# Patient Record
Sex: Female | Born: 1960
Health system: Southern US, Community
[De-identification: ages and names within clinical notes are randomized; demographics above are authoritative.]

## PROBLEM LIST (undated history)

## (undated) DIAGNOSIS — K760 Fatty (change of) liver, not elsewhere classified: Secondary | ICD-10-CM

## (undated) DIAGNOSIS — T4145XA Adverse effect of unspecified anesthetic, initial encounter: Secondary | ICD-10-CM

## (undated) DIAGNOSIS — Z8489 Family history of other specified conditions: Secondary | ICD-10-CM

## (undated) DIAGNOSIS — R112 Nausea with vomiting, unspecified: Secondary | ICD-10-CM

## (undated) DIAGNOSIS — K8010 Calculus of gallbladder with chronic cholecystitis without obstruction: Principal | ICD-10-CM

## (undated) DIAGNOSIS — E781 Pure hyperglyceridemia: Secondary | ICD-10-CM

## (undated) DIAGNOSIS — N6002 Solitary cyst of left breast: Secondary | ICD-10-CM

## (undated) DIAGNOSIS — I10 Essential (primary) hypertension: Secondary | ICD-10-CM

## (undated) DIAGNOSIS — C801 Malignant (primary) neoplasm, unspecified: Secondary | ICD-10-CM

## (undated) DIAGNOSIS — D649 Anemia, unspecified: Secondary | ICD-10-CM

## (undated) DIAGNOSIS — G43009 Migraine without aura, not intractable, without status migrainosus: Secondary | ICD-10-CM

## (undated) DIAGNOSIS — K8070 Calculus of gallbladder and bile duct without cholecystitis without obstruction: Secondary | ICD-10-CM

## (undated) DIAGNOSIS — E782 Mixed hyperlipidemia: Secondary | ICD-10-CM

## (undated) DIAGNOSIS — Z5189 Encounter for other specified aftercare: Secondary | ICD-10-CM

## (undated) DIAGNOSIS — R7303 Prediabetes: Secondary | ICD-10-CM

## (undated) DIAGNOSIS — Z9889 Other specified postprocedural states: Secondary | ICD-10-CM

## (undated) DIAGNOSIS — R7302 Impaired glucose tolerance (oral): Secondary | ICD-10-CM

## (undated) DIAGNOSIS — K851 Biliary acute pancreatitis without necrosis or infection: Secondary | ICD-10-CM

## (undated) HISTORY — DX: Biliary acute pancreatitis without necrosis or infection: K85.10

## (undated) HISTORY — PX: COLONOSCOPY: SHX174

## (undated) HISTORY — DX: Fatty (change of) liver, not elsewhere classified: K76.0

## (undated) HISTORY — DX: Encounter for other specified aftercare: Z51.89

## (undated) HISTORY — DX: Solitary cyst of left breast: N60.02

## (undated) HISTORY — DX: Essential (primary) hypertension: I10

## (undated) HISTORY — DX: Calculus of gallbladder with chronic cholecystitis without obstruction: K80.10

## (undated) HISTORY — DX: Anemia, unspecified: D64.9

## (undated) HISTORY — DX: Pure hyperglyceridemia: E78.1

## (undated) HISTORY — DX: Migraine without aura, not intractable, without status migrainosus: G43.009

---

## 2000-07-17 DIAGNOSIS — T8859XA Other complications of anesthesia, initial encounter: Secondary | ICD-10-CM

## 2000-07-17 HISTORY — DX: Other complications of anesthesia, initial encounter: T88.59XA

## 2002-09-16 ENCOUNTER — Ambulatory Visit (HOSPITAL_COMMUNITY): Admission: RE | Admit: 2002-09-16 | Discharge: 2002-09-16 | Payer: Self-pay | Admitting: Internal Medicine

## 2002-09-16 ENCOUNTER — Encounter: Payer: Self-pay | Admitting: Internal Medicine

## 2003-05-07 ENCOUNTER — Ambulatory Visit (HOSPITAL_COMMUNITY): Admission: RE | Admit: 2003-05-07 | Discharge: 2003-05-07 | Payer: Self-pay | Admitting: Obstetrics

## 2003-05-07 ENCOUNTER — Encounter: Payer: Self-pay | Admitting: Obstetrics

## 2003-05-18 ENCOUNTER — Encounter (INDEPENDENT_AMBULATORY_CARE_PROVIDER_SITE_OTHER): Payer: Self-pay | Admitting: *Deleted

## 2003-05-18 LAB — CONVERTED CEMR LAB

## 2003-09-23 ENCOUNTER — Encounter: Admission: RE | Admit: 2003-09-23 | Discharge: 2003-09-23 | Payer: Self-pay | Admitting: Family Medicine

## 2005-04-13 ENCOUNTER — Ambulatory Visit: Payer: Self-pay | Admitting: Family Medicine

## 2005-04-13 ENCOUNTER — Encounter: Admission: RE | Admit: 2005-04-13 | Discharge: 2005-04-13 | Payer: Self-pay | Admitting: Sports Medicine

## 2005-04-17 ENCOUNTER — Ambulatory Visit: Payer: Self-pay | Admitting: Family Medicine

## 2005-04-21 ENCOUNTER — Ambulatory Visit: Payer: Self-pay | Admitting: Family Medicine

## 2005-04-21 ENCOUNTER — Ambulatory Visit (HOSPITAL_COMMUNITY): Admission: RE | Admit: 2005-04-21 | Discharge: 2005-04-21 | Payer: Self-pay | Admitting: Family Medicine

## 2005-04-26 ENCOUNTER — Encounter: Admission: RE | Admit: 2005-04-26 | Discharge: 2005-04-26 | Payer: Self-pay | Admitting: Family Medicine

## 2006-09-13 DIAGNOSIS — K649 Unspecified hemorrhoids: Secondary | ICD-10-CM | POA: Insufficient documentation

## 2006-09-14 ENCOUNTER — Encounter (INDEPENDENT_AMBULATORY_CARE_PROVIDER_SITE_OTHER): Payer: Self-pay | Admitting: *Deleted

## 2006-12-06 ENCOUNTER — Ambulatory Visit: Payer: Self-pay | Admitting: Gynecology

## 2006-12-07 ENCOUNTER — Ambulatory Visit (HOSPITAL_COMMUNITY): Admission: RE | Admit: 2006-12-07 | Discharge: 2006-12-07 | Payer: Self-pay | Admitting: Physician Assistant

## 2007-01-09 ENCOUNTER — Ambulatory Visit: Payer: Self-pay | Admitting: Family Medicine

## 2007-01-09 DIAGNOSIS — I10 Essential (primary) hypertension: Secondary | ICD-10-CM | POA: Insufficient documentation

## 2007-01-09 DIAGNOSIS — G43009 Migraine without aura, not intractable, without status migrainosus: Secondary | ICD-10-CM

## 2007-01-09 HISTORY — DX: Migraine without aura, not intractable, without status migrainosus: G43.009

## 2007-02-01 ENCOUNTER — Ambulatory Visit: Payer: Self-pay | Admitting: Family Medicine

## 2007-02-15 ENCOUNTER — Ambulatory Visit (HOSPITAL_COMMUNITY): Admission: RE | Admit: 2007-02-15 | Discharge: 2007-02-15 | Payer: Self-pay | Admitting: Family Medicine

## 2007-09-18 ENCOUNTER — Ambulatory Visit: Payer: Self-pay | Admitting: Family Medicine

## 2007-09-25 ENCOUNTER — Ambulatory Visit: Payer: Self-pay | Admitting: Gynecology

## 2007-12-19 ENCOUNTER — Ambulatory Visit (HOSPITAL_COMMUNITY): Admission: RE | Admit: 2007-12-19 | Discharge: 2007-12-19 | Payer: Self-pay | Admitting: Family Medicine

## 2007-12-25 ENCOUNTER — Encounter: Payer: Self-pay | Admitting: Obstetrics & Gynecology

## 2007-12-25 ENCOUNTER — Ambulatory Visit: Payer: Self-pay | Admitting: Obstetrics & Gynecology

## 2008-09-16 ENCOUNTER — Encounter: Admission: RE | Admit: 2008-09-16 | Discharge: 2008-09-16 | Payer: Self-pay | Admitting: Obstetrics

## 2009-01-31 ENCOUNTER — Ambulatory Visit: Payer: Self-pay | Admitting: Occupational Medicine

## 2009-02-10 ENCOUNTER — Encounter: Payer: Self-pay | Admitting: Family Medicine

## 2009-02-10 ENCOUNTER — Ambulatory Visit: Payer: Self-pay | Admitting: Family Medicine

## 2009-02-23 ENCOUNTER — Ambulatory Visit: Payer: Self-pay | Admitting: Family Medicine

## 2009-02-23 ENCOUNTER — Encounter: Payer: Self-pay | Admitting: Family Medicine

## 2009-02-23 DIAGNOSIS — Z780 Asymptomatic menopausal state: Secondary | ICD-10-CM | POA: Insufficient documentation

## 2009-02-23 LAB — CONVERTED CEMR LAB
AST: 11 units/L (ref 0–37)
Alkaline Phosphatase: 75 units/L (ref 39–117)
CO2: 26 meq/L (ref 19–32)
Cholesterol: 274 mg/dL — ABNORMAL HIGH (ref 0–200)
Hemoglobin: 13.3 g/dL (ref 12.0–15.0)
LDL Cholesterol: 171 mg/dL — ABNORMAL HIGH (ref 0–99)
MCHC: 32.3 g/dL (ref 30.0–36.0)
MCV: 98.3 fL (ref 78.0–100.0)
Platelets: 306 10*3/uL (ref 150–400)
Potassium: 4.5 meq/L (ref 3.5–5.3)
RDW: 13.7 % (ref 11.5–15.5)
Triglycerides: 172 mg/dL — ABNORMAL HIGH (ref <150)
WBC: 7.9 10*3/uL (ref 4.0–10.5)

## 2009-02-24 ENCOUNTER — Encounter: Payer: Self-pay | Admitting: Family Medicine

## 2009-02-24 DIAGNOSIS — R7303 Prediabetes: Secondary | ICD-10-CM | POA: Insufficient documentation

## 2009-02-24 DIAGNOSIS — E782 Mixed hyperlipidemia: Secondary | ICD-10-CM | POA: Insufficient documentation

## 2009-07-30 ENCOUNTER — Ambulatory Visit: Payer: Self-pay | Admitting: Family Medicine

## 2009-11-05 ENCOUNTER — Encounter: Admission: RE | Admit: 2009-11-05 | Discharge: 2009-11-05 | Payer: Self-pay | Admitting: Family Medicine

## 2010-02-08 ENCOUNTER — Ambulatory Visit: Payer: Self-pay | Admitting: Family Medicine

## 2010-02-08 ENCOUNTER — Encounter: Payer: Self-pay | Admitting: Family Medicine

## 2010-02-09 LAB — CONVERTED CEMR LAB
AST: 13 units/L (ref 0–37)
HDL: 42 mg/dL (ref >39)
Potassium: 4.1 meq/L (ref 3.5–5.3)
TSH: 0.95 microintl units/mL (ref 0.350–4.500)
Total Bilirubin: 0.6 mg/dL (ref 0.3–1.2)
Total CHOL/HDL Ratio: 6
Triglycerides: 522 mg/dL — ABNORMAL HIGH (ref <150)
Vit D, 25-Hydroxy: 32 ng/mL (ref 30–89)

## 2010-05-26 ENCOUNTER — Encounter: Payer: Self-pay | Admitting: Family Medicine

## 2010-08-06 ENCOUNTER — Encounter: Payer: Self-pay | Admitting: Obstetrics & Gynecology

## 2010-08-16 NOTE — Miscellaneous (Signed)
  Clinical Lists Changes  Problems: Removed problem of HEALTH MAINTENANCE EXAM (ICD-V70.0) Removed problem of UNSPECIFIED CONTRACEPTIVE MANAGEMENT (ICD-V25.9) Removed problem of HAIR LOSS (ICD-704.00) Removed problem of BARTHOLIN'S CYST (ICD-616.2) Removed problem of SCREENING FOR MALIGNANT NEOPLASM OF THE CERVIX (ICD-V76.2) Removed problem of HEADACHE (ICD-784.0)

## 2010-08-16 NOTE — Assessment & Plan Note (Signed)
Summary: CYST/ [facility]   Vital Signs:  Patient profile:   50 year old female Weight:      153 pounds Temp:     98 degrees F oral Pulse rate:   80 / minute BP sitting:   110 / 73  (right arm)  Vitals Entered By: Arlyss Repress CMA, (July 30, 2009 11:36 AM) CC: cyst on labia x 2 days. Is Patient Diabetic? No Pain Assessment Patient in pain? yes     Location: labia Intensity: 7   CC:  cyst on labia x 2 days.[name]  History of Present Illness: Cyst on labia, has been there for a long time, seemed to swell get larger the past 2 days and more uncomfortable.  No drainage, no fever.  Habits & Providers  Alcohol-Tobacco-Diet     Tobacco Status: never  Current Medications (verified): 1)  Vitamin .... Once A Day 2)  Atenolol-Chlorthalidone 50-25 Mg Tabs (Atenolol-Chlorthalidone) .... One Daily 3)  Royal Jelly .... As You Have Been Using For Hot Flashes  Allergies: No Known Drug Allergies  Physical Exam  General:  Well-developed,well-nourished,in no acute distress; alert,appropriate and cooperative throughout examination Genitalia:  left raised bathloins gland, central opening, mildy tender, no signs of infection.   Impression & Recommendations:  Problem # 1:  BARTHOLIN'S CYST (ICD-616.2)  Does not appear to be infected, patient chose to treat conservatively and not have I and D today, would recommend she had I&D if it does not improve or have secondary infection.  Discussed seeing GYN for this, as they have instruments to remove the cystic sac.  Orders: [facility]- Est Level  2 ([address])  Complete Medication List: 1)  Vitamin  .... Once a day 2)  Atenolol-chlorthalidone 50-25 Mg Tabs (Atenolol-chlorthalidone) .... One daily 3)  Royal Jelly  .... As you have been using for hot flashes  Patient Instructions: 1)  Frequent hot sitz baths, if not better return Monday

## 2010-08-16 NOTE — Assessment & Plan Note (Signed)
Summary: PHYSICAL/PAP/[facility]   Vital Signs:  Patient profile:   50 year old female Height:      60.5 inches Weight:      148 pounds BMI:     28.53 Temp:     98.0 degrees F oral Pulse rate:   90 / minute BP sitting:   120 / 77  (left arm) Cuff size:   regular  Vitals Entered By: Tessie Fass CMA (February 08, 2010 8:57 AM) CC: complete physical with pap Is Patient Diabetic? No Pain Assessment Patient in pain? no        CC:  complete physical with pap.  History of Present Illness: Here for annual health maintenence exam.  She had a PAP last August that was normal and never has had an abnormal PAP.    Her bood sugars intermittently are elevated.  She has known high cholesterol and has refused statin therapy up to this point.  She is taking her BP medicaion and BP has been running under 120.  She denies any further migrane headaches.  The bartholins cyst resolved itself with home care and has not returned.    She has not had a menses since May 2011.  She wants to make sure she is not pregnant.  Current Medications (verified): 1)  Vitamin .... Once A Day 2)  Atenolol-Chlorthalidone 50-25 Mg Tabs (Atenolol-Chlorthalidone) .... One Daily  Allergies (verified): No Known Drug Allergies  Review of Systems      See HPI General:  Denies loss of appetite, malaise, sleep disorder, sweats, and weight loss. CV:  Denies chest pain or discomfort and swelling of feet. Resp:  Denies cough. GI:  Denies constipation and diarrhea. GU:  Denies discharge and dysuria. MS:  Denies joint pain. Psych:  Denies depression.  Physical Exam  General:  Well-developed,well-nourished,in no acute distress; alert,appropriate and cooperative throughout examination Eyes:  PERL, fundi normal Ears:  R ear normal and L ear normal.   Nose:  External nasal examination shows no deformity or inflammation. Nasal mucosa are pink and moist without lesions or exudates. Mouth:  good dentition, no dental plaque, and  pharynx pink and moist.   Neck:  No deformities, masses, or tenderness noted. Breasts:  No mass, nodules, thickening, tenderness, bulging, retraction, inflamation, nipple discharge or skin changes noted.   Lungs:  normal respiratory effort and normal breath sounds.   Heart:  normal rate, regular rhythm, and no murmur.   Abdomen:  soft, non-tender, normal bowel sounds, and no distention.   Msk:  normal ROM and no joint tenderness.   Pulses:  R and L carotid,radial,femoral,dorsalis pedis and posterior tibial pulses are full and equal bilaterally Extremities:  No clubbing, cyanosis, edema, or deformity noted with normal full range of motion of all joints.   Neurologic:  alert & oriented X3, cranial nerves II-XII intact, and strength normal in all extremities.   Skin:  Intact without suspicious lesions or rashes Cervical Nodes:  No lymphadenopathy noted Axillary Nodes:  No palpable lymphadenopathy Psych:  Cognition and judgment appear intact. Alert and cooperative with normal attention span and concentration. No apparent delusions, illusions, hallucinations   Impression & Recommendations:  Problem # 1:  HEALTH MAINTENANCE EXAM (ICD-V70.0)  Orders: [facility] - Est  40-64 yrs ([address]11)  Problem # 2:  HYPERTENSION, BENIGN ESSENTIAL (ICD-401.1)  Her updated medication list for this problem includes:    Atenolol-chlorthalidone 50-25 Mg Tabs (Atenolol-chlorthalidone) ..... One daily  Problem # 3:  HAIR LOSS (ICD-704.00)  Orders: TSH-[facility] ([address]1478-29562)  Problem # 4:  HYPERLIPIDEMIA (ICD-272.4)  Orders: T-Lipid Profile (95621-30865)  Problem # 5:  HYPERGLYCEMIA (ICD-790.29)  Orders: A1C-[facility] (78469)  Complete Medication List: 1)  Vitamin  .... Once a day 2)  Atenolol-chlorthalidone 50-25 Mg Tabs (Atenolol-chlorthalidone) .... One daily  Other Orders: T-Comprehensive Metabolic Panel [id number]) Vit D, 25 [place]-[facility] ([address]-27253) U Preg-[facility] ([address]40)  Patient Instructions: 1)  PAP every 2  years due afte 02/24/11 2)  return as needed 3)  I will call you with your cholesterol 4)  Recommend aspirin 81 mg daily Prescriptions: ATENOLOL-CHLORTHALIDONE 50-25 MG TABS (ATENOLOL-CHLORTHALIDONE) one daily  #90 x 3   Entered and Authorized by:   Luretha Murphy NP   Signed by:   Luretha Murphy NP on 02/08/2010   Method used:   Electronically to        [facility]* (retail)       [address]       [address] Shipping/mailing       [place], [place]  [address]       Ph: [phone]       Fax: [phone]   RxID:   [id number]    Prevention & Chronic Care Immunizations   Influenza vaccine: Not documented    Tetanus booster: 07/17/2001: Done.    Pneumococcal vaccine: Not documented  Other Screening   Pap smear: NEGATIVE FOR INTRAEPITHELIAL LESIONS OR MALIGNANCY.  (02/23/2009)   Pap smear action/deferral: Deferred-2 yr interval  (02/08/2010)    Mammogram: ASSESSMENT: Negative - BI-RADS 1^MM DIGITAL SCREENING  (11/05/2009)   Mammogram due: 11/06/2010   Smoking status: never  (07/30/2009)  Lipids   Total Cholesterol: 274  (02/23/2009)   Lipid panel action/deferral: Lipid Panel ordered   LDL: 171  (02/23/2009)   LDL Direct: Not documented   HDL: 69  (02/23/2009)   Triglycerides: 172  (02/23/2009)    SGOT (AST): 11  (02/23/2009)   BMP action: Ordered   SGPT (ALT): 24  (02/23/2009) CMP ordered    Alkaline phosphatase: 75  (02/23/2009)   Total bilirubin: 0.6  (02/23/2009)    Lipid flowsheet reviewed?: Yes   Progress toward LDL goal: Improved  Hypertension   Last Blood Pressure: 120 / 77  (02/08/2010)   Serum creatinine: 0.65  (02/23/2009)   BMP action: Ordered   Serum potassium 4.5  (02/23/2009) CMP ordered     Hypertension flowsheet reviewed?: Yes   Progress toward BP goal: Improved  Self-Management Support :    Hypertension self-management support: Not documented    Lipid self-management support: Not documented    Appended  Document: A1c  6.1 %    Lab Visit  Laboratory Results   Urine Tests  Date/Time Received: February 08, 2010 9:55 AM  Date/Time Reported: February 08, 2010 2:25 PM     Urine HCG: negative Comments: ...............test performed by......Marland KitchenBonnie A. [place], MLS (ASCP)cm   Blood Tests   Date/Time Received: February 08, 2010 9:55 AM  Date/Time Reported: February 08, 2010 2:24 PM   HGBA1C: 6.1%   (Normal Range: Non-Diabetic - 3-6%   Control Diabetic - 6-8%)  Comments: ...............test performed by......Marland KitchenBonnie A. [place], MLS (ASCP)cm    Orders Today:

## 2010-08-24 ENCOUNTER — Encounter: Payer: Self-pay | Admitting: *Deleted

## 2010-10-19 ENCOUNTER — Ambulatory Visit (INDEPENDENT_AMBULATORY_CARE_PROVIDER_SITE_OTHER): Payer: Commercial Managed Care - PPO | Admitting: Family Medicine

## 2010-10-19 ENCOUNTER — Other Ambulatory Visit: Payer: Self-pay | Admitting: Family Medicine

## 2010-10-19 VITALS — BP 115/77 | HR 97 | Temp 98.8°F | Wt 153.4 lb

## 2010-10-19 DIAGNOSIS — Z1239 Encounter for other screening for malignant neoplasm of breast: Secondary | ICD-10-CM

## 2010-10-19 DIAGNOSIS — N61 Mastitis without abscess: Secondary | ICD-10-CM | POA: Insufficient documentation

## 2010-10-19 MED ORDER — AMOXICILLIN-POT CLAVULANATE 875-125 MG PO TABS: 1.0000 | ORAL_TABLET | Freq: Two times a day (BID) | ORAL | Status: AC

## 2010-10-19 NOTE — Patient Instructions (Signed)
Take the antibiotic as directed When you are feeling better we should get you scheduled for mammogram  I hope you feel better! Have a great day!  - Dr. Wallene Huh       Mastitis (Breast Infection) Mastitis is a germ (bacterial) infection of the breast tissue. CAUSES Bacteria causes infection by entering the breast tissue through cuts or openings in the skin. Typically, this occurs with breastfeeding due to cracked or irritated skin. It can be associated with plugged ducts. Nipple piercing can also lead to mastitis. SYMPTOMS In mastitis, an area of the breast becomes puffy (swollen), red, tender, and painful. You may notice you have a fever and swelling of the glands under your arm on that side. If the infection is allowed to progress, an collection of pus (abscess) may develop. DIAGNOSIS Your caregiver can diagnose mastitis based on your symptoms, and upon examination. The diagnosis can be confirmed if pus can be expressed from the breast. This pus can be examined in the lab to determine which bacteria are present. If an abscess has developed, the fluid in the abscess can be removed with a needle. This is used to confirm the diagnosis and determine the bacteria present. In most cases, pus will not be present. Blood tests can be done to determine if your body is fighting a bacterial infection. Sometimes, a mammogram or ultrasound will be recommended to exclude other breast diseases including cancer. Other rare forms of mastitis:  Tuberculosis mastitis is rare. The TB germ can affect the breast if it is present in some other part of the body. The breast may be slightly tender with a mass, but not tender or painful.   Syphilis of the nipple usually has an ulcer that is not tender.   Actinomycosis is a very rare bacteria infection of the breast that presents as a mass in the breast that is not tender or painful.   Phlebitis (inflammation of blood vessels) of the breast is an inflammation of the  veins in the breast. It may be caused by tight fitting bras, surgery or trauma to the breast.   Inflammatory carcinoma of the breast looks like mastitis because the breasts are red, swollen and tender, but it is a rare form of breast cancer.  TREATMENT Medicines that kill germs (antibiotics) are used to treat the bacterial infection. Your caregiver will determine which bacteria are most likely to be causing the infection and select an antibiotic. This is sometimes changed based on the results of cultures, or if there is no response to the antibiotic selected. Antibiotics are usually given by mouth. If you are breastfeeding, it is important to continue to empty the breast. Your caregiver can tell you whether or not this milk is safe for your infant, or needs to be thrown away. Pain can usually be treated with medication. HOME CARE INSTRUCTIONS  Take all antibiotics as told. Do not stop them just because you have improved after a few days.   Only take over-the-counter or prescription medicines for pain, discomfort, or fever as directed by your caregiver.   If breastfeeding, keep your nipples clean and dry. Your caregiver may tell you to stop nursing until he or she feels it is safe for your baby. Use a breast pump as instructed if forced to stop nursing.   Do not wear a tight bra. Wear a good support bra.   Empty the first breast completely before going to the other breast. If your baby is not emptying your breasts  completely for some reason, use a breast pump to empty your breasts.   If you go back to work, pump your breasts while at work to stay in time with your nursing schedule.   Increase your fluid intake especially if you have a fever.   Avoid having your breasts get overly filled with milk (engorged).  SEEK MEDICAL CARE IF:  You develop pus like (purulent) discharge from the breast.   An oral temperature above 100.4 develops, not controlled by medication.   Your symptoms get worse.     You do not seem to be responding to your treatment within two days.  SEEK IMMEDIATE MEDICAL CARE IF:  The pain and swelling is getting worse.   You develop pain that is not controlled with medication.   You develop a red line extending from the breast toward your armpit.  Document Released: 07/03/2005 Document Re-Released: 04/30/2009 [facility] Patient Information 2011 [place], [place].

## 2010-10-19 NOTE — Progress Notes (Signed)
  Subjective:    Patient ID: Nichole Wilson, female    DOB: [birth year], 50 y.o.   MRN: [id number]  HPI 1) Left breast pain: x 3 days. Worsening. Noticed increased warmth and area of redness last night. Denies nipple drainage, other skin changes, nausea, emesis, diarrhea, lethargy, fever, weight loss, personal history of breast cancer, trauma, lymphadenopathy.    Review of Systems As per HPI otherwise negative     Objective:   Physical Exam  Pulmonary/Chest: Right breast exhibits no inverted nipple, no mass, no nipple discharge, no skin change and no tenderness. Left breast exhibits skin change and tenderness. Left breast exhibits no inverted nipple, no mass and no nipple discharge.         Area of erythema and induration 3 cm x 3 cm at left breast at 2 o'clock position without obvious abscess but with surrounding increased warmth as compared to right   Lymphadenopathy:       Right axillary: No pectoral and no lateral adenopathy present.       Left axillary: No pectoral and no lateral adenopathy present.      Right: No supraclavicular adenopathy present.       Left: No supraclavicular adenopathy present.          Assessment & Plan:

## 2010-10-19 NOTE — Assessment & Plan Note (Signed)
Will treat with Augmentin as below. Follow up if not improving. Will obtain screening ultrasound (due this month) after resolves. No signs of abscess. Handout given.

## 2010-11-29 NOTE — Group Therapy Note (Signed)
NAMEDAYLAH, SAYAVONG NO.:  [id number]   MEDICAL RECORD NO.:  [id number]          PATIENT TYPE:  WOC   LOCATION:  [facility]                   FACILITY:  [facility]   PHYSICIAN:  Allie Bossier, MD        DATE OF BIRTH:  12/06/1960   DATE OF SERVICE:                                  CLINIC NOTE   Ms. Feggins is a 50 year old married Hispanic gravida 2, para 2 with an  20 and 50-year-old daughter.  She comes in here for annual exam today.  She has no particular complaints.  In the course of the last year, she  has been monitoring her blood pressure at home and has discontinued her  25 mg of hydrochlorothiazide.  She does work in the [employer] and  says that her blood pressures are normal at home.   PAST MEDICAL HISTORY:  1. Borderline hypertension.  2. Borderline high cholesterol   REVIEW OF SYSTEMS:  She has been married for the last 12 years.  She  denies dyspareunia.  She is using the rhythm method for birth control.   PAST SURGICAL HISTORY:  Cesarean section.   ALLERGIES:  NO KNOWN DRUG ALLERGIES.  NO LATEX ALLERGIES.   SOCIAL HISTORY:  Negative for tobacco, alcohol or drug use.   FAMILY HISTORY:  Negative for breast, GYN and colon malignancies.  There  is a family history of diabetes.   MEDICATIONS:  None.   PHYSICAL EXAMINATION:  VITAL SIGNS:  Weight 150, height 5 feet.  Blood  pressure 131/88, pulse 108.  HEENT:  Normal.  HEART:  Regular rate and rhythm.  LUNGS:  Clear to auscultation bilaterally.  BREASTS:  Normal.  ABDOMEN:  Benign.  EXTERNAL GENITALIA:  Normal cervix, no lesions, it is directed  posteriorly.  Uterus normal size and shape, anteverted, mobile,  nontender.  Adnexa nontender.  No masses.   ASSESSMENT/PLAN:  Annual exam.  I have checked a Pap smear.  Recommended  self-breast and self vulvar exam monthly.  Due to the family history of  diabetes and her overweight status, I will check a CBG and a fasting  lipid panel will be  scheduled.      Allie Bossier, MD     MCD/MEDQ  D:  12/25/2007  T:  12/25/2007  Job:  [id number]

## 2011-01-11 ENCOUNTER — Other Ambulatory Visit: Payer: Self-pay | Admitting: Family Medicine

## 2011-01-11 DIAGNOSIS — Z1231 Encounter for screening mammogram for malignant neoplasm of breast: Secondary | ICD-10-CM

## 2011-01-16 ENCOUNTER — Ambulatory Visit: Payer: Commercial Managed Care - PPO

## 2011-01-23 ENCOUNTER — Ambulatory Visit
Admission: RE | Admit: 2011-01-23 | Discharge: 2011-01-23 | Disposition: A | Discharge status: Home / Self Care | Payer: Commercial Managed Care - PPO | Source: Ambulatory Visit | Attending: Family Medicine | Admitting: Family Medicine

## 2011-01-23 DIAGNOSIS — Z1231 Encounter for screening mammogram for malignant neoplasm of breast: Secondary | ICD-10-CM

## 2011-01-27 ENCOUNTER — Other Ambulatory Visit: Payer: Self-pay | Admitting: Family Medicine

## 2011-01-27 DIAGNOSIS — I10 Essential (primary) hypertension: Secondary | ICD-10-CM

## 2011-01-27 NOTE — Telephone Encounter (Signed)
Refill request

## 2011-05-08 ENCOUNTER — Other Ambulatory Visit: Payer: Self-pay | Admitting: Family Medicine

## 2011-05-08 DIAGNOSIS — I10 Essential (primary) hypertension: Secondary | ICD-10-CM

## 2011-05-09 NOTE — Telephone Encounter (Signed)
Spoke with patient and appointment scheduled for 05/19/2011 for BP follow up and refills meds. Tenoretic refilled for one month

## 2011-05-19 ENCOUNTER — Encounter: Payer: Self-pay | Admitting: Family Medicine

## 2011-05-19 ENCOUNTER — Ambulatory Visit (INDEPENDENT_AMBULATORY_CARE_PROVIDER_SITE_OTHER): Payer: 59 | Admitting: Family Medicine

## 2011-05-19 VITALS — BP 114/78 | HR 88 | Ht 62.0 in | Wt 158.0 lb

## 2011-05-19 DIAGNOSIS — I10 Essential (primary) hypertension: Secondary | ICD-10-CM

## 2011-05-19 DIAGNOSIS — E785 Hyperlipidemia, unspecified: Secondary | ICD-10-CM

## 2011-05-19 MED ORDER — ATENOLOL-CHLORTHALIDONE 50-25 MG PO TABS: 1.0000 | ORAL_TABLET | Freq: Every day | ORAL | Status: DC

## 2011-05-19 NOTE — Patient Instructions (Signed)
It was great seeing you today! Your blood pressure looks great. I'll refill the medicine for another 3 months and check some lab work to make sure all your electrolytes are ok.  I'll also order lab work for your cholesterol. Come by anytime after having fasted from the night before.  Continue diet and exercise and I'll let you know what your lab results are.

## 2011-05-20 NOTE — Progress Notes (Signed)
  Subjective:    Patient ID: Nichole Wilson, female    DOB: [birth year], 50 y.o.   MRN: [id number]  HPI  50 yo female with h/o HTN and hypertriglyciridemia who presents for follow up on blood pressure. She is currently taking Atenolo-Chrothalidone 50-25mg . She checks her BP once weekly and says it runs in the 115's systolic and 60's to 70's diastolic. Denies any chest pain, shortness of breath, heart palpitations, lower extremity swelling.  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 Additionally, she was not fasting during for that lab test.  She has been counting her carbs and eats three meals per day with high protein snacks in the morning and afternoon. She has not been able to exercise as much as she would like.   Review of Systems Negative except per HPI.    Objective:   Physical Exam BP: 114/78 HR: 88 weight: 158lbs, height: 5.45ft Physical Examination: General appearance - alert, well appearing, and in no distress Neck - supple, no significant adenopathy Chest - clear to auscultation, no wheezes, rales or rhonchi, symmetric air entry Heart - normal rate, regular rhythm, normal S1, S2, no murmurs, rubs, clicks or gallops Abdomen - soft, nontender, nondistended, no masses or organomegaly Extremities - peripheral pulses normal, no pedal edema, no clubbing or cyanosis       Assessment & Plan:  50 yo female with h/o hypertension and hypertriglyciridemia 1. Hypertension: well controlled on tenoretic 50-25mg . Will refill for another 3 months and check BMet for K+ levels beore refilling for a year. With plan of diet and exercise, it would be possible to take one agent off at some point. 2. Hyerlipidemia: will check a fasting lipid panel. Will continue with lifestyle modification for now.

## 2011-05-23 ENCOUNTER — Other Ambulatory Visit: Payer: 59

## 2011-05-23 DIAGNOSIS — E785 Hyperlipidemia, unspecified: Secondary | ICD-10-CM

## 2011-05-23 DIAGNOSIS — I10 Essential (primary) hypertension: Secondary | ICD-10-CM

## 2011-05-23 LAB — LIPID PANEL
Cholesterol: 256 mg/dL — ABNORMAL HIGH (ref 0–200)
HDL: 40 mg/dL (ref >39)
Total CHOL/HDL Ratio: 6.4 Ratio

## 2011-05-23 LAB — BASIC METABOLIC PANEL
Glucose, Bld: 109 mg/dL — ABNORMAL HIGH (ref 70–99)
Potassium: 3.6 mEq/L (ref 3.5–5.3)
Sodium: 137 mEq/L (ref 135–145)

## 2011-05-23 NOTE — Progress Notes (Signed)
BMP AND FLP DONE TODAY Avonne Berkery 

## 2011-05-25 ENCOUNTER — Telehealth: Payer: Self-pay | Admitting: Family Medicine

## 2011-05-25 NOTE — Telephone Encounter (Signed)
Called patient and left a message saying that I wanted to go over her test results with her. Asked her to make an appointment in the next week or so in order to go over treatment strategies for her elevated triglycerides.

## 2011-06-01 ENCOUNTER — Encounter: Payer: Self-pay | Admitting: Family Medicine

## 2011-06-01 ENCOUNTER — Ambulatory Visit (INDEPENDENT_AMBULATORY_CARE_PROVIDER_SITE_OTHER): Payer: 59 | Admitting: Family Medicine

## 2011-06-01 DIAGNOSIS — E785 Hyperlipidemia, unspecified: Secondary | ICD-10-CM

## 2011-06-01 DIAGNOSIS — I10 Essential (primary) hypertension: Secondary | ICD-10-CM

## 2011-06-02 ENCOUNTER — Other Ambulatory Visit: Payer: 59

## 2011-06-02 DIAGNOSIS — E785 Hyperlipidemia, unspecified: Secondary | ICD-10-CM

## 2011-06-02 MED ORDER — FENOFIBRATE 160 MG PO TABS: 160.0000 mg | ORAL_TABLET | Freq: Every day | ORAL | Status: DC

## 2011-06-02 NOTE — Assessment & Plan Note (Addendum)
Unclear etiology, given that triglycerides used to be normal in 2010. No clear evidence of increased alcohol intake. Patient's glucose is below 126 fasting. Her increase therefore does not seem directly associated with diabetes. Will check a TSH to rule out hypothyroidism as a  reason for her elevated triglycerides. Will start patient on fenofibrate 160mg  daily. Advised patient about the common side effects such as upset stomach. Advised patient to stop medication and return to clinic or hospital if acute abdominal pain, if muscle pain or if dark urine.  Discussed continuing with diet modification and increase exercise. Will obtain CMet to get to LFTs as a baseline before starting her fenofibrate. Will recheck fasting lipid panel and complete metabolic panel in 2 months.

## 2011-06-02 NOTE — Progress Notes (Signed)
cmp and tsh done today Nichole Wilson

## 2011-06-02 NOTE — Progress Notes (Signed)
  Subjective:    Patient ID: Nichole Wilson, female    DOB: [birth year], 50 y.o.   MRN: [id number]  HPI 50 year old female with history of hypertriglyceridemia and hypertension who presented, for followup discussion on treatment options for elevated triglycerides. Patient had to leave the office before being seen due to a next appointment that she had. Patient was called the next day and we talked about plan over the phone. Patient's triglycerides were below 202, in 2010 and then increased to 502 in 2011. I asked the patient if she had been on any cholesterol medicine at that time and she denies ever being on any medication before for her high cholesterol. she has been careful with her carbohydrate intake and does admit that she hasn't been exercising as much as she needs to. She denies any current abdominal pain. She denies any recent increase in alcohol intake. She drinks half a glass of wine every so often, less than once or twice a week.  Review of Systems Negative except per history of present illness    Objective:   Physical Exam NA since patient was not seen and this was a phone consult       Assessment & Plan:

## 2011-06-03 LAB — COMPREHENSIVE METABOLIC PANEL
ALT: 25 U/L (ref 0–35)
AST: 21 U/L (ref 0–37)
Calcium: 9.8 mg/dL (ref 8.4–10.5)
Total Protein: 7.2 g/dL (ref 6.0–8.3)

## 2011-06-03 LAB — TSH: TSH: 2.187 u[IU]/mL (ref 0.350–4.500)

## 2011-06-13 ENCOUNTER — Other Ambulatory Visit: Payer: Self-pay | Admitting: Family Medicine

## 2011-06-13 DIAGNOSIS — E785 Hyperlipidemia, unspecified: Secondary | ICD-10-CM

## 2011-06-15 ENCOUNTER — Ambulatory Visit: Payer: 59 | Admitting: Family Medicine

## 2011-11-06 ENCOUNTER — Other Ambulatory Visit: Payer: Self-pay | Admitting: Family Medicine

## 2011-11-27 ENCOUNTER — Encounter: Payer: Self-pay | Admitting: Gastroenterology

## 2011-11-28 ENCOUNTER — Other Ambulatory Visit: Payer: Self-pay | Admitting: Physician Assistant

## 2011-11-28 ENCOUNTER — Ambulatory Visit
Admission: RE | Admit: 2011-11-28 | Discharge: 2011-11-28 | Disposition: A | Discharge status: Home / Self Care | Payer: 59 | Source: Ambulatory Visit | Attending: Physician Assistant | Admitting: Physician Assistant

## 2011-11-29 ENCOUNTER — Encounter: Payer: Self-pay | Admitting: *Deleted

## 2011-12-01 ENCOUNTER — Other Ambulatory Visit: Payer: 59

## 2011-12-01 ENCOUNTER — Telehealth: Payer: Self-pay

## 2011-12-01 ENCOUNTER — Ambulatory Visit (INDEPENDENT_AMBULATORY_CARE_PROVIDER_SITE_OTHER): Payer: 59 | Admitting: Gastroenterology

## 2011-12-01 ENCOUNTER — Telehealth: Payer: Self-pay | Admitting: *Deleted

## 2011-12-01 ENCOUNTER — Encounter: Payer: Self-pay | Admitting: Gastroenterology

## 2011-12-01 ENCOUNTER — Other Ambulatory Visit: Payer: Self-pay | Admitting: Gastroenterology

## 2011-12-01 VITALS — BP 102/70 | HR 68 | Ht 65.0 in | Wt 150.0 lb

## 2011-12-01 DIAGNOSIS — K8050 Calculus of bile duct without cholangitis or cholecystitis without obstruction: Secondary | ICD-10-CM

## 2011-12-01 DIAGNOSIS — K5900 Constipation, unspecified: Secondary | ICD-10-CM

## 2011-12-01 DIAGNOSIS — R195 Other fecal abnormalities: Secondary | ICD-10-CM

## 2011-12-01 DIAGNOSIS — R7989 Other specified abnormal findings of blood chemistry: Secondary | ICD-10-CM

## 2011-12-01 DIAGNOSIS — K8020 Calculus of gallbladder without cholecystitis without obstruction: Secondary | ICD-10-CM

## 2011-12-01 DIAGNOSIS — R945 Abnormal results of liver function studies: Secondary | ICD-10-CM

## 2011-12-01 DIAGNOSIS — R17 Unspecified jaundice: Secondary | ICD-10-CM

## 2011-12-01 DIAGNOSIS — R109 Unspecified abdominal pain: Secondary | ICD-10-CM

## 2011-12-01 MED ORDER — TRAMADOL HCL 50 MG PO TABS: 50.0000 mg | ORAL_TABLET | Freq: Four times a day (QID) | ORAL | Status: AC | PRN

## 2011-12-01 MED ORDER — CIPROFLOXACIN HCL 500 MG PO TABS: 500.0000 mg | ORAL_TABLET | Freq: Two times a day (BID) | ORAL | Status: DC

## 2011-12-01 NOTE — Patient Instructions (Signed)
Take Tramadol as needed for pain.  Continue Prilosec once a day. You will need a colonoscopy in 2-3 months we will call you to schedule this after your ERCP and possible surgical consult.  Please go to the basement today for your labs.  Your procedure has been scheduled for 12/05/2011, please follow the seperate instructions.  You need to go to [facility] on 12/04/2011 at 1:00pm for a pre appt, You must go to this appt to have your procedure.

## 2011-12-01 NOTE — Progress Notes (Signed)
History of Present Illness:  This is a 51-year-old Hispanic female referred by [facility] for evaluation of one year history of abdominal gas, bloating, and they upper abdominal discomfort, worse over the last 4-6 weeks. She mostly has postprandial bloating and fullness which at times will call us epigastric pain radiating into her back with mild nausea but no emesis. She's tried Prilosec without improvement. She recently has noticed that she has scleral icterus, clay colored stools and dark urine. The patient denies fever, chills, rectal bleeding, or lower abdominal pain. She has not had previous endoscopy or colonoscopy. Review of her labs shows bilirubin of 5 yesterday with serum transaminases approximately 200. Ultrasound showed multiple gallstones with dilated common bile duct and intrahepatic ducts. There is been no evidence of pancreatitis or hepatitis otherwise. She does not smoke, use ethanol or frequent NSAIDs.  I have reviewed this patient's present history, medical and surgical past history, allergies and medications.     ROS: The remainder of the 10 point ROS is negative... she does complain of chronic back pain and also periodic hemorrhoidal bleeding. She's had a variety of herbal therapies and homeopathic medications over the last several weeks, also attempts at suction therapy or abdominal wall.  No Known Allergies Outpatient Prescriptions Prior to Visit  Medication Sig Dispense Refill  . atenolol-chlorthalidone (TENORETIC) 50-25 MG per tablet Take 1 tablet by mouth daily.  30 tablet  2  . fenofibrate 160 MG tablet TAKE 1 TABLET (160 MG TOTAL) BY MOUTH DAILY.  30 tablet  3   Past Medical History  Diagnosis Date  . Hypertriglyceridemia   . Hypertension   . Hepatic steatosis   . Cholelithiasis   . Breast cyst, left   . Migraine without aura, without mention of intractable migraine without mention of status migrainosus    Past Surgical History  Procedure Date  .  Cesarean section     2002   History   Social History  . Marital Status: Married    Spouse Name: N/A    Number of Children: 2  . Years of Education: N/A   Occupational History  .  [employer]   Social History Main Topics  . Smoking status: Never Smoker   . Smokeless tobacco: Never Used  . Alcohol Use: No  . Drug Use: No  . Sexually Active: None   Other Topics Concern  . None   Social History Narrative   No caffein   Family History  Problem Relation Age of Onset  . Diabetes Mother   . Diabetes Father   . Diabetes Brother         Physical Exam: Slightly icteric appearing patient in no acute distress. Blood pressure today 102/70, pulse 60 and regular, and weight 150 pounds. General well developed well nourished patient in no acute distress, appearing their stated age Eyes PERRLA, no icterus, fundoscopic exam per opthamologist.. scleral icterus present. Skin no lesions noted Neck supple, no adenopathy, no thyroid enlargement, no tenderness Chest clear to percussion and auscultation Heart no significant murmurs, gallops or rubs noted Abdomen no hepatosplenomegaly masses or tenderness, BS normal. Large hemorrhagic suction marks in the upper abdomen from recent homeopathy therapy. Rectal inspection normal no fissures, or fistulae noted.  No masses or tenderness on digital exam. Stool guaiac positive. Extremities no acute joint lesions, edema, phlebitis or evidence of cellulitis. Neurologic patient oriented x 3, cranial nerves intact, no focal neurologic deficits noted. Psychological mental status normal and normal affect.  Assessment and plan:   Cholelithiasis with probable choledocholithiasis. This patient does not appear acutely all but does need ERCP ASAP. I spoke with Dr. Robert Kaplan who will perform this procedure Monday with Cipro coverage. We will give a" heads up" too [facility]. She will eventually need cholecystectomy. Also after these problems have  been addressed, she will need colonoscopy per her guaiac positive stool and chronic constipation. I have given her some tramadol use every 6-8 hours when necessary for pain, and also ask her to continue her omeprazole 40 mg a day. She is on aspirin 81 mg a day but denies other NSAID use. Family history is noncontributory for any type of GI malignancy. Her lab review shows a normal CBC and hemoglobin. On May 16 bilirubin was 4.6, alkaline phosphatase 2:15, AST 236, and ALT 401. I have gone ahead and placed her on Cipro 500 mg twice a day over the weekend. Please copy Dr. Robert Kaplan, [facility], and her primary care physicians.  Encounter Diagnoses  Name Primary?  . Gallstones Yes  . Abdominal pain     

## 2011-12-01 NOTE — Telephone Encounter (Signed)
Per dr Jarold Motto pt needs to take Cipro 500mg  one bid for one week. I have sent the rx and pt is aware

## 2011-12-01 NOTE — Telephone Encounter (Signed)
Pt scheduled for previsit with anesthesia 12/04/11@1pm . ERCP scheduled for 12/05/11 arrival time 6:30am for a 7:30am procedure. Booking number [id number]. Pt to be NPO after midnight prior to procedure.

## 2011-12-04 ENCOUNTER — Encounter (HOSPITAL_COMMUNITY): Payer: Self-pay

## 2011-12-04 ENCOUNTER — Ambulatory Visit (HOSPITAL_COMMUNITY)
Admission: RE | Admit: 2011-12-04 | Discharge: 2011-12-04 | Disposition: A | Discharge status: Home / Self Care | Payer: 59 | Source: Ambulatory Visit | Attending: Gastroenterology | Admitting: Gastroenterology

## 2011-12-04 HISTORY — DX: Adverse effect of unspecified anesthetic, initial encounter: T41.45XA

## 2011-12-04 NOTE — Patient Instructions (Addendum)
Pt advised nothing to eat/drink after midnight, no jewelry, perfume, or nail polish. Advised to arrive by 7:00AM.

## 2011-12-05 ENCOUNTER — Ambulatory Visit (HOSPITAL_COMMUNITY)
Admission: RE | Admit: 2011-12-05 | Discharge: 2011-12-05 | Disposition: A | Discharge status: Home / Self Care | Payer: 59 | Source: Ambulatory Visit | Attending: Gastroenterology | Admitting: Gastroenterology

## 2011-12-05 ENCOUNTER — Encounter (HOSPITAL_COMMUNITY): Payer: Self-pay | Admitting: Registered Nurse

## 2011-12-05 ENCOUNTER — Encounter (HOSPITAL_COMMUNITY): Payer: Self-pay | Admitting: *Deleted

## 2011-12-05 ENCOUNTER — Ambulatory Visit (HOSPITAL_COMMUNITY): Payer: 59

## 2011-12-05 ENCOUNTER — Encounter (HOSPITAL_COMMUNITY)
Admission: RE | Disposition: A | Discharge status: Home / Self Care | Payer: Self-pay | Source: Ambulatory Visit | Attending: Gastroenterology

## 2011-12-05 ENCOUNTER — Ambulatory Visit (HOSPITAL_COMMUNITY): Payer: 59 | Admitting: Registered Nurse

## 2011-12-05 DIAGNOSIS — I10 Essential (primary) hypertension: Secondary | ICD-10-CM | POA: Insufficient documentation

## 2011-12-05 DIAGNOSIS — K8050 Calculus of bile duct without cholangitis or cholecystitis without obstruction: Secondary | ICD-10-CM | POA: Insufficient documentation

## 2011-12-05 HISTORY — PX: ERCP: SHX5425

## 2011-12-05 LAB — GLUCOSE, CAPILLARY

## 2011-12-05 SURGERY — ENDOSCOPIC RETROGRADE CHOLANGIOPANCREATOGRAPHY (ERCP)
Anesthesia: General

## 2011-12-05 MED ORDER — LACTATED RINGERS IV SOLN
INTRAVENOUS | Status: DC | PRN
  Administered 2011-12-05 (×2): via INTRAVENOUS

## 2011-12-05 MED ORDER — CIPROFLOXACIN IN D5W 400 MG/200ML IV SOLN
INTRAVENOUS | Status: DC | PRN
  Administered 2011-12-05: 400 mg via INTRAVENOUS

## 2011-12-05 MED ORDER — GLUCAGON HCL (RDNA) 1 MG IJ SOLR
INTRAMUSCULAR | Status: AC
  Filled 2011-12-05: qty 2

## 2011-12-05 MED ORDER — MIDAZOLAM HCL 5 MG/5ML IJ SOLN
INTRAMUSCULAR | Status: DC | PRN
  Administered 2011-12-05: 2 mg via INTRAVENOUS

## 2011-12-05 MED ORDER — FENTANYL CITRATE 0.05 MG/ML IJ SOLN
INTRAMUSCULAR | Status: DC | PRN
  Administered 2011-12-05 (×2): 25 ug via INTRAVENOUS
  Administered 2011-12-05: 50 ug via INTRAVENOUS
  Administered 2011-12-05: 25 ug via INTRAVENOUS
  Administered 2011-12-05: 50 ug via INTRAVENOUS

## 2011-12-05 MED ORDER — SUCCINYLCHOLINE CHLORIDE 20 MG/ML IJ SOLN
INTRAMUSCULAR | Status: DC | PRN
  Administered 2011-12-05: 100 mg via INTRAVENOUS
  Administered 2011-12-05: 60 mg via INTRAVENOUS
  Administered 2011-12-05: 100 mg via INTRAVENOUS

## 2011-12-05 MED ORDER — DEXAMETHASONE SODIUM PHOSPHATE 10 MG/ML IJ SOLN
INTRAMUSCULAR | Status: DC | PRN
  Administered 2011-12-05: 10 mg via INTRAVENOUS

## 2011-12-05 MED ORDER — PROPOFOL 10 MG/ML IV EMUL
INTRAVENOUS | Status: DC | PRN
  Administered 2011-12-05: 50 mg via INTRAVENOUS
  Administered 2011-12-05: 200 mg via INTRAVENOUS

## 2011-12-05 MED ORDER — IOHEXOL 300 MG/ML  SOLN
INTRAMUSCULAR | Status: DC | PRN
  Administered 2011-12-05: 20 mL

## 2011-12-05 MED ORDER — PROPOFOL 10 MG/ML IV EMUL
INTRAVENOUS | Status: DC | PRN
  Administered 2011-12-05: 120 ug/kg/min via INTRAVENOUS

## 2011-12-05 MED ORDER — LIDOCAINE HCL (CARDIAC) 20 MG/ML IV SOLN
INTRAVENOUS | Status: DC | PRN
  Administered 2011-12-05: 40 mg via INTRAVENOUS

## 2011-12-05 MED ORDER — GLUCAGON HCL (RDNA) 1 MG IJ SOLR
INTRAMUSCULAR | Status: DC | PRN
  Administered 2011-12-05 (×3): 1 mg via INTRAVENOUS

## 2011-12-05 MED ORDER — GLYCOPYRROLATE 0.2 MG/ML IJ SOLN
INTRAMUSCULAR | Status: DC | PRN
  Administered 2011-12-05: 0.2 mg via INTRAVENOUS

## 2011-12-05 MED ORDER — ONDANSETRON HCL 4 MG/2ML IJ SOLN
INTRAMUSCULAR | Status: DC | PRN
  Administered 2011-12-05: 4 mg via INTRAVENOUS

## 2011-12-05 MED ORDER — FENTANYL CITRATE 0.05 MG/ML IJ SOLN: 25.0000 ug | INTRAMUSCULAR | Status: DC | PRN

## 2011-12-05 MED ORDER — LIDOCAINE HCL 4 % MT SOLN
OROMUCOSAL | Status: DC | PRN
  Administered 2011-12-05: 4 mL via TOPICAL

## 2011-12-05 MED ORDER — CIPROFLOXACIN IN D5W 400 MG/200ML IV SOLN
INTRAVENOUS | Status: AC
  Filled 2011-12-05: qty 200

## 2011-12-05 NOTE — Anesthesia Postprocedure Evaluation (Signed)
  Anesthesia Post-op Note  Patient: Nichole Wilson  Procedure(s) Performed: Procedure(s) (LRB): ENDOSCOPIC RETROGRADE CHOLANGIOPANCREATOGRAPHY (ERCP) (N/A)  Patient Location: PACU  Anesthesia Type: General  Level of Consciousness: oriented and sedated  Airway and Oxygen Therapy: Patient Spontanous Breathing  Post-op Pain: mild  Post-op Assessment: Post-op Vital signs reviewed, Patient's Cardiovascular Status Stable, Respiratory Function Stable and Patent Airway  Post-op Vital Signs: stable  Complications: No apparent anesthesia complications

## 2011-12-05 NOTE — Anesthesia Preprocedure Evaluation (Signed)
Anesthesia Evaluation  Patient identified by MRN, date of birth, ID band Patient awake    Reviewed: Allergy & Precautions, H&P , NPO status , Patient's Chart, lab work & pertinent test results, reviewed documented beta blocker date and time   Airway Mallampati: II TM Distance: >3 FB     Dental  (+) Dental Advisory Given and Teeth Intact   Pulmonary neg pulmonary ROS,          Cardiovascular hypertension, Pt. on medications Rhythm:Regular Rate:Normal  Denies cardiac symptoms   Neuro/Psych negative neurological ROS  negative psych ROS   GI/Hepatic Neg liver ROS, Gallstones, obstructive   Endo/Other  negative endocrine ROS  Renal/GU negative Renal ROS  negative genitourinary   Musculoskeletal negative musculoskeletal ROS (+)   Abdominal   Peds negative pediatric ROS (+)  Hematology negative hematology ROS (+)   Anesthesia Other Findings Upper front bridge  Reproductive/Obstetrics negative OB ROS                           Anesthesia Physical Anesthesia Plan  ASA: II  Anesthesia Plan: General   Post-op Pain Management:    Induction: Intravenous  Airway Management Planned: Oral ETT  Additional Equipment:   Intra-op Plan:   Post-operative Plan: Extubation in OR  Informed Consent: I have reviewed the patients History and Physical, chart, labs and discussed the procedure including the risks, benefits and alternatives for the proposed anesthesia with the patient or authorized representative who has indicated his/her understanding and acceptance.   Dental advisory given  Plan Discussed with: CRNA and Surgeon  Anesthesia Plan Comments:         Anesthesia Quick Evaluation

## 2011-12-05 NOTE — H&P (View-Only) (Signed)
History of Present Illness:  This is a 51 year old Hispanic female referred by [facility] for evaluation of one year history of abdominal gas, bloating, and they upper abdominal discomfort, worse over the last 4-6 weeks. She mostly has postprandial bloating and fullness which at times will call us epigastric pain radiating into her back with mild nausea but no emesis. She's tried Prilosec without improvement. She recently has noticed that she has scleral icterus, clay colored stools and dark urine. The patient denies fever, chills, rectal bleeding, or lower abdominal pain. She has not had previous endoscopy or colonoscopy. Review of her labs shows bilirubin of 5 yesterday with serum transaminases approximately 200. Ultrasound showed multiple gallstones with dilated common bile duct and intrahepatic ducts. There is been no evidence of pancreatitis or hepatitis otherwise. She does not smoke, use ethanol or frequent NSAIDs.  I have reviewed this patient's present history, medical and surgical past history, allergies and medications.     ROS: The remainder of the 10 point ROS is negative... she does complain of chronic back pain and also periodic hemorrhoidal bleeding. She's had a variety of herbal therapies and homeopathic medications over the last several weeks, also attempts at suction therapy or abdominal wall.  No Known Allergies Outpatient Prescriptions Prior to Visit  Medication Sig Dispense Refill  . atenolol-chlorthalidone (TENORETIC) 50-25 MG per tablet Take 1 tablet by mouth daily.  30 tablet  2  . fenofibrate 160 MG tablet TAKE 1 TABLET (160 MG TOTAL) BY MOUTH DAILY.  30 tablet  3   Past Medical History  Diagnosis Date  . Hypertriglyceridemia   . Hypertension   . Hepatic steatosis   . Cholelithiasis   . Breast cyst, left   . Migraine without aura, without mention of intractable migraine without mention of status migrainosus    Past Surgical History  Procedure Date  .  Cesarean section     2002   History   Social History  . Marital Status: Married    Spouse Name: N/A    Number of Children: 2  . Years of Education: N/A   Occupational History  .  [employer]   Social History Main Topics  . Smoking status: Never Smoker   . Smokeless tobacco: Never Used  . Alcohol Use: No  . Drug Use: No  . Sexually Active: None   Other Topics Concern  . None   Social History Narrative   No caffein   Family History  Problem Relation Age of Onset  . Diabetes Mother   . Diabetes Father   . Diabetes Brother         Physical Exam: Slightly icteric appearing patient in no acute distress. Blood pressure today 102/70, pulse 60 and regular, and weight 150 pounds. General well developed well nourished patient in no acute distress, appearing their stated age Eyes PERRLA, no icterus, fundoscopic exam per opthamologist.. scleral icterus present. Skin no lesions noted Neck supple, no adenopathy, no thyroid enlargement, no tenderness Chest clear to percussion and auscultation Heart no significant murmurs, gallops or rubs noted Abdomen no hepatosplenomegaly masses or tenderness, BS normal. Large hemorrhagic suction marks in the upper abdomen from recent homeopathy therapy. Rectal inspection normal no fissures, or fistulae noted.  No masses or tenderness on digital exam. Stool guaiac positive. Extremities no acute joint lesions, edema, phlebitis or evidence of cellulitis. Neurologic patient oriented x 3, cranial nerves intact, no focal neurologic deficits noted. Psychological mental status normal and normal affect.  Assessment and plan:  Cholelithiasis with probable choledocholithiasis. This patient does not appear acutely all but does need ERCP ASAP. I spoke with Dr. Melvia Heaps who will perform this procedure Monday with Cipro coverage. We will give a" heads up" too [facility]. She will eventually need cholecystectomy. Also after these problems have  been addressed, she will need colonoscopy per her guaiac positive stool and chronic constipation. I have given her some tramadol use every 6-8 hours when necessary for pain, and also ask her to continue her omeprazole 40 mg a day. She is on aspirin 81 mg a day but denies other NSAID use. Family history is noncontributory for any type of GI malignancy. Her lab review shows a normal CBC and hemoglobin. On May 16 bilirubin was 4.6, alkaline phosphatase 2:15, AST 236, and ALT 401. I have gone ahead and placed her on Cipro 500 mg twice a day over the weekend. Please copy Dr. Melvia Heaps, [facility] surgery, and her primary care physicians.  Encounter Diagnoses  Name Primary?  . Gallstones Yes  . Abdominal pain

## 2011-12-05 NOTE — Transfer of Care (Signed)
Immediate Anesthesia Transfer of Care Note  Patient: Nichole Wilson  Procedure(s) Performed: Procedure(s) (LRB): ENDOSCOPIC RETROGRADE CHOLANGIOPANCREATOGRAPHY (ERCP) (N/A)  Patient Location: PACU  Anesthesia Type: General  Level of Consciousness: awake  Airway & Oxygen Therapy: Patient Spontanous Breathing and Patient connected to face mask oxygen  Post-op Assessment: Report given to PACU RN, Post -op Vital signs reviewed and stable and Patient moving all extremities X 4  Post vital signs: stable  Complications: No apparent anesthesia complications

## 2011-12-05 NOTE — Preoperative (Signed)
Beta Blockers   Reason not to administer Beta Blockers:Hold beta blocker due to other  Took beta blocker last pm at 8

## 2011-12-05 NOTE — Discharge Instructions (Signed)
Reschedule ERCP 1-2 weeks  Moderate Sedation, Adult Moderate sedation is given to help you relax or even sleep through a procedure. You may remain sleepy, be clumsy, or have poor balance for several hours following this procedure. Arrange for a responsible adult, family member, or friend to take you home. A responsible adult should stay with you for at least 24 hours or until the medicines have worn off.  Do not participate in any activities where you could become injured for the next 24 hours, or until you feel normal again. Do not:   Drive.   Swim.   Ride a bicycle.   Operate heavy machinery.   Cook.   Use power tools.   Climb ladders.   Work at [employer].   Do not make important decisions or sign legal documents until you are improved.   Vomiting may occur if you eat too soon. When you can drink without vomiting, try water, juice, or soup. Try solid foods if you feel little or no nausea.   Only take over-the-counter or prescription medications for pain, discomfort, or fever as directed by your caregiver.If pain medications have been prescribed for you, ask your caregiver how soon it is safe to take them.   Make sure you and your family fully understands everything about the medication given to you. Make sure you understand what side effects may occur.   You should not drink alcohol, take sleeping pills, or medications that cause drowsiness for at least 24 hours.   If you smoke, do not smoke alone.   If you are feeling better, you may resume normal activities 24 hours after receiving sedation.   Keep all appointments as scheduled. Follow all instructions.   Ask questions if you do not understand.  SEEK MEDICAL CARE IF:   Your skin is pale or bluish in color.   You continue to feel sick to your stomach (nauseous) or throw up (vomit).   Your pain is getting worse and not helped by medication.   You have bleeding or swelling.   You are still sleepy or feeling clumsy  after 24 hours.  SEEK IMMEDIATE MEDICAL CARE IF:   You develop a rash.   You have difficulty breathing.   You develop any type of allergic problem.   You have a fever.   Endoscopic Retrograde Cholangiopancreatography (ERCP) ERCP stands for endoscopic retrograde cholangiopancreatography. In this procedure, a thin, lighted tube (endoscope) is used. It is passed through the mouth and down the back of the throat into the upper part of the intestine, called the duodenum. A small, plastic tube (cannula) is then passed through the endoscope. It is directed into the bile duct or pancreatic duct. Dye is then injected through the tube. X-rays are taken to study the biliary and pancreatic passageways. This procedure is used to diagnosis many diseases of the pancreas, bile ducts, liver, and gallbladder. LET YOUR CAREGIVER KNOW ABOUT:   Allergies to food or medicine.   Medicines taken, including vitamins, herbs, eyedrops, over-the-counter medicines, and creams.   Use of steroids (by mouth or creams).   Previous problems with anesthetics or numbing medicines.   History of bleeding problems or blood clots.   Previous surgery.   Other health problems, including diabetes and kidney problems.   Possibility of pregnancy, if this applies.   Any barium X-rays during the past week.  BEFORE THE PROCEDURE   Do not eat or drink anything, including water, for at least 6 hours before the procedure.  Ask your caregiver whether you should stop taking certain medicines prior to your procedure.   Arrange for someone to drive you home. You will not be allowed to drive for several hours after the procedure.   Arrive at least 60 minutes before the procedure or as directed. This will give you time to check in and fill out any necessary paperwork.  PROCEDURE   You will be given medicine through a vein (intravenously) to make you relaxed and sleepy.   You might have a breathing tube placed to give you  medicine that makes you sleep (general anesthetic).   Your throat may be sprayed with medicine that numbs the area (local anesthetic).   You will lie on your left side. The endoscope will be inserted through your mouth and into the duodenum. The tube will not interfere with your breathing. Gagging is prevented by the anesthesia.   While X-rays are being taken, you may be positioned on your stomach.  During the ERCP, the person performing the procedure may identify a blockage or narrowed opening to the bile duct. A muscular portion of the main bile duct may be partially cut (sphincterotomy). A thin, plastic tube (stent) will be positioned inside your bile duct. This is to allow fluid secreted by the liver (bile) to flow more easily through the narrowed opening. AFTER THE PROCEDURE   You will rest in bed until you are fully conscious.   When you first wake up, your throat may feel slightly sore.   You will not be allowed to eat or drink until numbness subsides.   Once you are able to drink, urinate, and sit on the edge of the bed without feeling sick to your stomach (nauseous) or dizzy, you may be allowed to go home.   Have a friend or family member with you for the first 24 hours after your procedure.  SEEK MEDICAL CARE IF:   You have an oral temperature above 102 F (38.9 C).   You develop other signs of infection, including chills or feeling unwell.   You have abdominal pain.   You have questions or concerns.  MAKE SURE YOU:   Understand these instructions.   Will watch your condition.   Will get help right away if you are not doing well or get worse.  Document Released: 03/28/2001 Document Revised: 06/22/2011 Document Reviewed: 10/19/2009 [facility] Patient Information 2012 [place], [place].

## 2011-12-05 NOTE — Interval H&P Note (Signed)
History and Physical Interval Note:  12/05/2011 8:01 AM  Nichole Wilson  has presented today for surgery, with the diagnosis of Gallstones [574.20]  The various methods of treatment have been discussed with the patient and family. After consideration of risks, benefits and other options for treatment, the patient has consented to  Procedure(s) (LRB): ENDOSCOPIC RETROGRADE CHOLANGIOPANCREATOGRAPHY (ERCP) (N/A) as a surgical intervention .  The patients' history has been reviewed, patient examined, no change in status, stable for surgery.  I have reviewed the patients' chart and labs.  Questions were answered to the patient's satisfaction.    The recent H&P (dated *12/01/11**) was reviewed, the patient was examined and there is no change in the patients condition since that H&P was completed.   Melvia Heaps  12/05/2011, 8:03 AM    Melvia Heaps

## 2011-12-05 NOTE — Op Note (Signed)
[facility] [address] [place], [place]  [address]  ERCP PROCEDURE REPORT  PATIENT:  Nichole, Wilson  MR#:  [id number] BIRTHDATE:  1960/11/13  GENDER:  female  ENDOSCOPIST:  Barbette Hair. Arlyce Dice, MD ASSISTANT:  Beryle Beams, Technician, Anthony Sar, RN, Claudie Revering, RN CGRN  PROCEDURE DATE:  12/05/2011 PROCEDURE:  ERCP with sphincterotomy, ERCP with stent placement ASA CLASS:  Class II  INDICATIONS:  suspected stone  MEDICATIONS:   MAC sedation, administered by CRNA, cipro 400mg dIV TOPICAL ANESTHETIC:  DESCRIPTION OF PROCEDURE:   After the risks benefits and alternatives of the procedure were thoroughly explained, informed consent was obtained.  The Pentax ERCP [id number] endoscope was introduced through the mouth and advanced to the third portion of the duodenum.  The pancreatic duct was successfully cannulated and filled with contrast. Care was taken not to overfill the duct.  The common hepatic duct and intrahepatics were succesfully opacified with contrast. Because of difficulty cannulating the common bile duct a 3Fr 3cm plastic pancreatic stent was placed. Unfortunately the stent fell out of the duct. A second stent measuring 4 French 5 cm in length was placed. Because of scope malfunction the duodenum scope was replaced. Upon reintubation of the duodenum it was noted that the second stent had fallen out. A third 5 French 3 centimeter pigtail stent was successfully placed. Following this the common bile duct was cannulated with a 0.35 mm guidewire. Injection demonstrated a markedly dilated duct with at least one large stone measuring greater than 12 mm in the distal bile duct. A 2 mm sphincterotomy was made. At that point, there was severe spasm in the duodenum and it was decided to terminate the procedure. 10 French 6 cm plastic biliary stent was placed. The procedure was then terminated.  Fluoroscopy time 9 (see image2).3 minutes 20 cc contrast dye  injected    The scope was then completely withdrawn from the patient and the procedure terminated. <<PROCEDUREIMAGES>>  COMPLICATIONS:  None  ENDOSCOPIC IMPRESSION: Common bile duct stone(s); status post partial sphincterotomy and biliary stent placement Status post pancreatic duct stent placement  RECOMMENDATIONS: Repeat ERCP in one to 2 weeks for sphincterotomy and stone extraction  cc: Dr. Sheryn Bison  ______________________________ Barbette Hair. Arlyce Dice, MD  CC:  Sheryn Bison, MD  n. Rosalie Doctor:   Barbette Hair. Offie Pickron at 12/05/2011 10:47 AM  Delorise Royals, [phone]

## 2011-12-06 ENCOUNTER — Encounter (HOSPITAL_COMMUNITY): Payer: Self-pay | Admitting: Gastroenterology

## 2011-12-06 ENCOUNTER — Other Ambulatory Visit: Payer: Self-pay | Admitting: *Deleted

## 2011-12-06 DIAGNOSIS — R7989 Other specified abnormal findings of blood chemistry: Secondary | ICD-10-CM

## 2011-12-07 ENCOUNTER — Encounter (HOSPITAL_COMMUNITY): Payer: Self-pay | Admitting: Pharmacy Technician

## 2011-12-07 ENCOUNTER — Other Ambulatory Visit (INDEPENDENT_AMBULATORY_CARE_PROVIDER_SITE_OTHER): Payer: 59

## 2011-12-07 ENCOUNTER — Telehealth: Payer: Self-pay | Admitting: Gastroenterology

## 2011-12-07 ENCOUNTER — Encounter (HOSPITAL_COMMUNITY): Payer: Self-pay | Admitting: Physician Assistant

## 2011-12-07 ENCOUNTER — Ambulatory Visit: Payer: 59 | Admitting: Physician Assistant

## 2011-12-07 ENCOUNTER — Inpatient Hospital Stay (HOSPITAL_COMMUNITY)
Admission: AD | Admit: 2011-12-07 | Discharge: 2011-12-08 | DRG: 393 | Disposition: A | Discharge status: Home / Self Care | Payer: 59 | Source: Ambulatory Visit | Attending: Gastroenterology | Admitting: Gastroenterology

## 2011-12-07 DIAGNOSIS — K8020 Calculus of gallbladder without cholecystitis without obstruction: Secondary | ICD-10-CM

## 2011-12-07 DIAGNOSIS — I10 Essential (primary) hypertension: Secondary | ICD-10-CM | POA: Diagnosis present

## 2011-12-07 DIAGNOSIS — Z79899 Other long term (current) drug therapy: Secondary | ICD-10-CM

## 2011-12-07 DIAGNOSIS — K859 Acute pancreatitis without necrosis or infection, unspecified: Secondary | ICD-10-CM | POA: Diagnosis present

## 2011-12-07 DIAGNOSIS — K929 Disease of digestive system, unspecified: Principal | ICD-10-CM | POA: Diagnosis present

## 2011-12-07 DIAGNOSIS — Y849 Medical procedure, unspecified as the cause of abnormal reaction of the patient, or of later complication, without mention of misadventure at the time of the procedure: Secondary | ICD-10-CM | POA: Diagnosis present

## 2011-12-07 DIAGNOSIS — R7989 Other specified abnormal findings of blood chemistry: Secondary | ICD-10-CM

## 2011-12-07 DIAGNOSIS — R7309 Other abnormal glucose: Secondary | ICD-10-CM | POA: Diagnosis present

## 2011-12-07 DIAGNOSIS — E785 Hyperlipidemia, unspecified: Secondary | ICD-10-CM | POA: Diagnosis present

## 2011-12-07 DIAGNOSIS — K8070 Calculus of gallbladder and bile duct without cholecystitis without obstruction: Secondary | ICD-10-CM | POA: Diagnosis present

## 2011-12-07 DIAGNOSIS — K8050 Calculus of bile duct without cholangitis or cholecystitis without obstruction: Secondary | ICD-10-CM

## 2011-12-07 DIAGNOSIS — Z7982 Long term (current) use of aspirin: Secondary | ICD-10-CM

## 2011-12-07 HISTORY — DX: Impaired glucose tolerance (oral): R73.02

## 2011-12-07 HISTORY — DX: Mixed hyperlipidemia: E78.2

## 2011-12-07 HISTORY — DX: Calculus of gallbladder and bile duct without cholecystitis without obstruction: K80.70

## 2011-12-07 LAB — CBC WITH DIFFERENTIAL/PLATELET
Basophils Relative: 0 % (ref 0.0–3.0)
Eosinophils Relative: 0 % (ref 0.0–5.0)
HCT: 38.6 % (ref 36.0–46.0)
Hemoglobin: 13.2 g/dL (ref 12.0–15.0)
Lymphs Abs: 2 10*3/uL (ref 0.7–4.0)
MCV: 95 fl (ref 78.0–100.0)
Monocytes Absolute: 0.7 10*3/uL (ref 0.1–1.0)
RBC: 4.06 Mil/uL (ref 3.87–5.11)
WBC: 15.4 10*3/uL — ABNORMAL HIGH (ref 4.5–10.5)

## 2011-12-07 LAB — HEPATIC FUNCTION PANEL: Total Bilirubin: 2.5 mg/dL — ABNORMAL HIGH (ref 0.3–1.2)

## 2011-12-07 MED ORDER — MUPIROCIN 2 % EX OINT
TOPICAL_OINTMENT | CUTANEOUS | Status: AC
  Administered 2011-12-07: 1 via NASAL
  Filled 2011-12-07: qty 22

## 2011-12-07 MED ORDER — CIPROFLOXACIN HCL 500 MG PO TABS
500.0000 mg | ORAL_TABLET | Freq: Two times a day (BID) | ORAL | Status: DC
  Filled 2011-12-07 (×2): qty 1

## 2011-12-07 MED ORDER — PANTOPRAZOLE SODIUM 40 MG PO TBEC: 40.0000 mg | DELAYED_RELEASE_TABLET | Freq: Every day | ORAL | Status: DC

## 2011-12-07 MED ORDER — SODIUM CHLORIDE 0.9 % IV SOLN: Freq: Once | INTRAVENOUS | Status: DC

## 2011-12-07 MED ORDER — CIPROFLOXACIN HCL 500 MG PO TABS: 500.0000 mg | ORAL_TABLET | Freq: Two times a day (BID) | ORAL | Status: DC

## 2011-12-07 MED ORDER — MORPHINE SULFATE 2 MG/ML IJ SOLN: 1.0000 mg | INTRAMUSCULAR | Status: DC | PRN

## 2011-12-07 MED ORDER — CIPROFLOXACIN IN D5W 400 MG/200ML IV SOLN
400.0000 mg | Freq: Once | INTRAVENOUS | Status: AC
  Administered 2011-12-08: 400 mg via INTRAVENOUS
  Filled 2011-12-07: qty 200

## 2011-12-07 MED ORDER — MUPIROCIN 2 % EX OINT
TOPICAL_OINTMENT | Freq: Two times a day (BID) | CUTANEOUS | Status: DC
  Administered 2011-12-07: 1 via NASAL
  Administered 2011-12-08: 10:00:00 via NASAL
  Filled 2011-12-07: qty 22

## 2011-12-07 MED ORDER — SODIUM CHLORIDE 0.9 % IV SOLN
INTRAVENOUS | Status: DC
  Administered 2011-12-07 – 2011-12-08 (×3): via INTRAVENOUS

## 2011-12-07 MED ORDER — SODIUM CHLORIDE 0.9 % IV SOLN
3.0000 g | Freq: Once | INTRAVENOUS | Status: DC
  Filled 2011-12-07: qty 3

## 2011-12-07 NOTE — H&P (Addendum)
Chart was reviewed and patient was examined. X-rays were reviewed.    I agree with management and plans. Pt has mild post ERCP pancreatitis and retained CBD stones.  Plan ERCP and stone extraction but will defer until tomorrow to allow pancreatitis to subside.  Barbette Hair. Arlyce Dice, M.D., [facility]

## 2011-12-07 NOTE — Progress Notes (Signed)
Pt is to be admitted and ERCP postponed until tomorrow Leanna Battles    [phone]

## 2011-12-07 NOTE — Telephone Encounter (Signed)
Already taken care of pt seeing Amy Esterwood today

## 2011-12-07 NOTE — Progress Notes (Signed)
Report called to Harriett Sine, R.N.  on 5100.  Pt transferred to 5126.  Preop meds transported to Nancy,R.N. With pt.

## 2011-12-07 NOTE — H&P (Addendum)
[facility] History and Physical    Primary Care Physician:  Marena Chancy, MD resident from [facility]. Primary Gastroenterologist:  Dr. Arlyce Dice  CHIEF COMPLAINT:  Feeling bad, abdominal pain.   HPI: Nichole Wilson is a 51 y.o. female.  Seen at [facility] on 5/17 for one year history of abdominal gas, bloating, and they upper abdominal discomfort, worse over the last 4-6 weeks. Post-prandial fullness radiating epigastrum to back.  Mild nausea, no emesis.  No improvement with Prilisec. In recent days developed clay -colored stools, scleral icterus, dark urine.  No fever or shills. Bilirubin 5 on 5/16, transaminases in 200's.  Ultrasound showed multiple gallstones with dilated common bile duct and intrahepatic ducts.  Underwent ERCP on 5/21 by Dr Arlyce Dice.  During which a total of 3 stents were placed in the pancreatic duct due to difficulty cannulating the CBD. The first 2 stents fell out spontaneously, the third was therefore placed.  Pt had 2 mm sphincterotomy but, due to ductal spasm, Dr Arlyce Dice was unable to remove large distal CBD stone. There may have been other, non-visualized, stones as well. He then placed a stent into the CBD along with the previously placed pancreatic duct stent.   [name] He terminated the procedure and planned repeat ERCP in 2 weeks, to include full sphincterotomy and stone extraction. She was prescribed CIpro.  Pt called the GI office today.  She is feeling weak, abdominal pain, malaise, nausea pretty much since yesterday AM. Pain feels like a bloating, pressure in the upper abdomen.  She had sweats and chills last noc.  She was able to tolerate fruit, vegetable soup and water yesterday.   Stool this AM was initially pale but at the stool terminus it was brown. Had labs at GI office this AM.  Lipase slightly elevated at 62.  LFTs still elevated but T Bili is down to 2.5 and transaminases are better.   She was set  up for repeat ERCP this afternoon.  Seen now in Short stay center.  Took her ASA and BP meds this AM but not her Cipro.   Last ate Papaya at 8 AM today.    ROS:   Full 14 system review completed.  Low back pain.  No NSAIDs.  148 # 01/2010.  158# 05/2011.   No SOB, no cough.  No pruritus or rash. Chronic constipation, before BM today, previous last stool was 6 days ago.  Is premenopausal and had irregular periods, last was in April. No bleeding, bruising problems. No headaches seizures, limb weakness, dizzyness.  No tachycardia, palpitations, pedal edema, chest pain.   No rash, sores.   Past Medical History  Diagnosis Date  . Hypertriglyceridemia   . Hepatic steatosis   . Breast cyst, left   . Migraine without aura, without mention of intractable migraine without mention of status migrainosus   . Complication of anesthesia 2002    temporary hearing loss  . Hypertension   . Hypercholesterolemia with hypertriglyceridemia   . Cholelithiasis with choledocholithiasis     Past Surgical History  Procedure Date  . Cesarean section     2002  . Ercp 12/05/2011    Procedure: ENDOSCOPIC RETROGRADE CHOLANGIOPANCREATOGRAPHY (ERCP);  Surgeon: Louis Meckel, MD;  Location: [facility];  Service: Endoscopy;  Laterality: N/A;    Prior to Admission medications   Medication Sig Start Date End Date Taking? Authorizing Provider  aspirin 81 MG tablet Take 81 mg by mouth daily.    Historical Provider, MD  atenolol-chlorthalidone (  TENORETIC) 50-25 MG per tablet Take 1 tablet by mouth daily. 05/19/11   Lonia Skinner, MD  ciprofloxacin (CIPRO) 500 MG tablet Take 1 tablet (500 mg total) by mouth 2 (two) times daily. 12/07/11 12/17/11  Mardella Layman, MD  fenofibrate 160 MG tablet TAKE 1 TABLET (160 MG TOTAL) BY MOUTH DAILY. 11/06/11   Lonia Skinner, MD  omeprazole (PRILOSEC) 40 MG capsule Take 40 mg by mouth daily.    Historical Provider, MD  Probiotic Product (PROBIOTIC COLON SUPPORT PO) Take  1 tablet by mouth daily.    Historical Provider, MD  ranitidine (ZANTAC) 300 MG capsule Take 300 mg by mouth every evening.    Historical Provider, MD  traMADol (ULTRAM) 50 MG tablet Take 1 tablet (50 mg total) by mouth every 6 (six) hours as needed for pain. 12/01/11 12/11/11  Mardella Layman, MD    No current facility-administered medications for this encounter.   Current Outpatient Prescriptions  Medication Sig Dispense Refill  . aspirin 81 MG tablet Take 81 mg by mouth daily.      [name] atenolol-chlorthalidone (TENORETIC) 50-25 MG per tablet Take 1 tablet by mouth daily.  30 tablet  2  . ciprofloxacin (CIPRO) 500 MG tablet Take 1 tablet (500 mg total) by mouth 2 (two) times daily.  14 tablet  0  . fenofibrate 160 MG tablet TAKE 1 TABLET (160 MG TOTAL) BY MOUTH DAILY.  30 tablet  3  . omeprazole (PRILOSEC) 40 MG capsule Take 40 mg by mouth daily.      . Probiotic Product (PROBIOTIC COLON SUPPORT PO) Take 1 tablet by mouth daily.      . ranitidine (ZANTAC) 300 MG capsule Take 300 mg by mouth every evening.      . traMADol (ULTRAM) 50 MG tablet Take 1 tablet (50 mg total) by mouth every 6 (six) hours as needed for pain.  30 tablet  0    Allergies as of 12/07/2011  . (No Known Allergies)    Family History  Problem Relation Age of Onset  . Diabetes Mother   . Diabetes Father   . Diabetes Brother     History   Social History  . Marital Status: Married    Spouse Name: N/A    Number of Children: 2  . Years of Education: N/A   Occupational History  . 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    Social History Main Topics  . Smoking status: Never Smoker   . Smokeless tobacco: Never Used  . Alcohol Use: No  . Drug Use: No  . Sexually Active: Yes   Other Topics Concern  . Not on file   Social History Narrative   No caffein    PHYSICAL EXAM:     General:  Slightly jaundiced, a bit tired.  Not diaphoretic or toxic Ears:  Not  HOH Mouth:  Clear and MM moist  Neck: no mass, TMG  Lungs:  Clear B.  No SOB   Heart:  RRR.  Not tachy.   No MRG Abdomen:  Soft, BS.  Tender in ldft upper abdomen and upper mid abdomen.  No guard or rebound.   Rectal:  Not done Msk:  No joint swelling or deformity   Pulses:  Full pedal pulses B Extremities:  No pedal edema  Neurologic: no tremor. Fully oriented.  No gross deficits Skin:  Slight jaundice Psych:  Pleasant, relaxed.  Intelligent.  LAB RESULTS:  Basename 12/07/11 1044  WBC 15.4*  HGB 13.2  HCT 38.6  PLT 277.0   BMET No results found for this basename: NA:3,K:3,CL:3,CO2:3,GLUCOSE:3,BUN:3,CREATININE:3,CALCIUM:3 in the last 72 hours LFT   Ref. Range 06/02/2011 11:28 12/07/2011 10:44  Alkaline Phosphatase Latest Range: 39-117 U/L 77 282 (H)  Albumin Latest Range: 3.5-5.2 g/dL 4.4 4.0  Amylase Latest Range: 27-131 U/L  290 (H)  Lipase Latest Range: 11.0-59.0 U/L  112.0 (H)  AST Latest Range: 0-37 U/L 21 62 (H)  ALT Latest Range: 0-35 U/L 25 215 (H)  Total Protein Latest Range: 6.0-8.3 g/dL 7.2 7.6  Bilirubin, Direct Latest Range: 0.0-0.3 mg/dL  0.9 (H)  Total Bilirubin Latest Range: 0.3-1.2 mg/dL 0.5 2.5 (H)    RADIOLOGY STUDIES: None for this admission  Endoscopic Studies: 12/05/2011  ERCP  Dr Arlyce Dice Because of difficulty cannulating the common bile duct a  3Fr 3cm plastic pancreatic stent was placed.Unfortunately the  stent fell out of the duct. A second stent measuring 4 French 5 cm  in length was placed. Because of scope malfunction the duodenum  scope was replaced. Upon reintubation of the duodenum it was noted  that the second stent had fallen out. A third 5 French 3  centimeter pigtail stent was successfully placed. Following this  the common bile duct was cannulated with a 0.35 mm guidewire.  Injection demonstrated a markedly dilated duct with at least one  large stone measuring greater than 12 mm in the distal bile duct.  A 2 mm sphincterotomy  was made. At that point, there was severe  spasm in the duodenum and it was decided to terminate the  procedure. 10 French 6 cm plastic biliary stent was placed. The  procedure was then terminated.  ENDOSCOPIC IMPRESSION: Common bile duct stone(s); status post  partial sphincterotomy and biliary stent placement  Status post  pancreatic duct stent placement RECOMMENDATIONS: Repeat ERCP in one to 2 weeks for sphincterotomy  and stone extraction   IMPRESSION:   *  cholelithiasis and choledocholithiasis. ERCP 2 days ago with placement of pancreatic duct and common bile duct stents.  Dr Arlyce Dice unable to remove stone. Now with abdominal pain, nausea, malaise, elevated WBC count R/O post ERCP pancreatitis.  R/O bile duct leak/perforation.  PLAN: *  ERCP this afternoon.  *  Phone contacts are her brother [phone]                                      Friend and fellow cone translator: Darrelyn Hillock:  [phone]   LOS: 0 days   Jennye Moccasin  12/07/2011, 1:55 PM Pager [phone]

## 2011-12-07 NOTE — Telephone Encounter (Signed)
Patient complains of a lot of abdominal pain, and feels weak states that she feels like there are "brusies all over her stomach on the inside" She is taking the cipro. She is requesting to have the labs done today since she is feeling so bad, or to be seen today since she is feeling worse than before. Per Dr Jarold Motto patient needs to be seen today by Amy for possible admit, pt will have STAT labs done before appt today  Cbc, lft, amylase, lipase. Pt is aware and she will be here at 10:30am for labs. I have also refilled pts cipro Dr Jarold Motto wanted her to continue it until she could have her repeat ERCP.

## 2011-12-07 NOTE — Telephone Encounter (Signed)
Per Dr Jarold Motto patient needs to have repeat lfts and cbc due to pt needing repeat ERCP and she will need to stay on cipro. Nichole Wilson will call pt to schedule her repeat ERCP. Left message for pt to call back.

## 2011-12-08 ENCOUNTER — Encounter (HOSPITAL_COMMUNITY): Payer: Self-pay | Admitting: Anesthesiology

## 2011-12-08 ENCOUNTER — Encounter (HOSPITAL_COMMUNITY): Payer: Self-pay | Admitting: Gastroenterology

## 2011-12-08 ENCOUNTER — Inpatient Hospital Stay (HOSPITAL_COMMUNITY): Payer: 59

## 2011-12-08 ENCOUNTER — Encounter (HOSPITAL_COMMUNITY)
Admission: AD | Disposition: A | Discharge status: Home / Self Care | Payer: Self-pay | Source: Ambulatory Visit | Attending: Gastroenterology

## 2011-12-08 ENCOUNTER — Inpatient Hospital Stay (HOSPITAL_COMMUNITY): Payer: 59 | Admitting: Anesthesiology

## 2011-12-08 DIAGNOSIS — I10 Essential (primary) hypertension: Secondary | ICD-10-CM

## 2011-12-08 HISTORY — PX: ERCP: SHX5425

## 2011-12-08 LAB — CBC
MCH: 32.1 pg (ref 26.0–34.0)
MCHC: 34 g/dL (ref 30.0–36.0)
Platelets: 266 10*3/uL (ref 150–400)
RDW: 13.8 % (ref 11.5–15.5)

## 2011-12-08 LAB — COMPREHENSIVE METABOLIC PANEL
ALT: 131 U/L — ABNORMAL HIGH (ref 0–35)
Alkaline Phosphatase: 233 U/L — ABNORMAL HIGH (ref 39–117)
BUN: 7 mg/dL (ref 6–23)
CO2: 25 mEq/L (ref 19–32)
GFR calc Af Amer: >90 mL/min (ref >90)
GFR calc non Af Amer: >90 mL/min (ref >90)
Glucose, Bld: 133 mg/dL — ABNORMAL HIGH (ref 70–99)
Potassium: 3.3 mEq/L — ABNORMAL LOW (ref 3.5–5.1)
Sodium: 139 mEq/L (ref 135–145)
Total Bilirubin: 2.1 mg/dL — ABNORMAL HIGH (ref 0.3–1.2)
Total Protein: 6.6 g/dL (ref 6.0–8.3)

## 2011-12-08 LAB — LIPASE, BLOOD: Lipase: 37 U/L (ref 11–59)

## 2011-12-08 SURGERY — ERCP, WITH INTERVENTION IF INDICATED
Anesthesia: General

## 2011-12-08 MED ORDER — GLUCAGON HCL (RDNA) 1 MG IJ SOLR
INTRAMUSCULAR | Status: AC
  Filled 2011-12-08: qty 3

## 2011-12-08 MED ORDER — GLYCOPYRROLATE 0.2 MG/ML IJ SOLN
INTRAMUSCULAR | Status: AC
  Filled 2011-12-08: qty 1

## 2011-12-08 MED ORDER — GLYCOPYRROLATE 0.2 MG/ML IJ SOLN
INTRAMUSCULAR | Status: DC | PRN
  Administered 2011-12-08: 0.6 mg via INTRAVENOUS

## 2011-12-08 MED ORDER — ROCURONIUM BROMIDE 100 MG/10ML IV SOLN
INTRAVENOUS | Status: DC | PRN
  Administered 2011-12-08: 50 mg via INTRAVENOUS

## 2011-12-08 MED ORDER — ONDANSETRON HCL 4 MG/2ML IJ SOLN: 4.0000 mg | Freq: Four times a day (QID) | INTRAMUSCULAR | Status: DC | PRN

## 2011-12-08 MED ORDER — CIPROFLOXACIN IN D5W 400 MG/200ML IV SOLN
INTRAVENOUS | Status: AC
  Filled 2011-12-08: qty 200

## 2011-12-08 MED ORDER — BIOTENE DRY MOUTH MT LIQD: 15.0000 mL | Freq: Two times a day (BID) | OROMUCOSAL | Status: DC

## 2011-12-08 MED ORDER — MIDAZOLAM HCL 5 MG/5ML IJ SOLN
INTRAMUSCULAR | Status: DC | PRN
  Administered 2011-12-08: 2 mg via INTRAVENOUS

## 2011-12-08 MED ORDER — FENTANYL CITRATE 0.05 MG/ML IJ SOLN
INTRAMUSCULAR | Status: DC | PRN
  Administered 2011-12-08: 100 ug via INTRAVENOUS

## 2011-12-08 MED ORDER — ACETAMINOPHEN 325 MG PO TABS
650.0000 mg | ORAL_TABLET | ORAL | Status: DC | PRN
  Administered 2011-12-08: 650 mg via ORAL
  Filled 2011-12-08: qty 2

## 2011-12-08 MED ORDER — LACTATED RINGERS IV SOLN
INTRAVENOUS | Status: DC | PRN
  Administered 2011-12-08: 12:00:00 via INTRAVENOUS

## 2011-12-08 MED ORDER — LACTATED RINGERS IV SOLN
INTRAVENOUS | Status: DC
  Administered 2011-12-08: 12:00:00 via INTRAVENOUS

## 2011-12-08 MED ORDER — FENTANYL CITRATE 0.05 MG/ML IJ SOLN: 25.0000 ug | INTRAMUSCULAR | Status: DC | PRN

## 2011-12-08 MED ORDER — ONDANSETRON HCL 4 MG/2ML IJ SOLN
INTRAMUSCULAR | Status: DC | PRN
  Administered 2011-12-08: 4 mg via INTRAVENOUS

## 2011-12-08 MED ORDER — LIDOCAINE HCL (CARDIAC) 20 MG/ML IV SOLN
INTRAVENOUS | Status: DC | PRN
  Administered 2011-12-08: 70 mg via INTRAVENOUS

## 2011-12-08 MED ORDER — NEOSTIGMINE METHYLSULFATE 1 MG/ML IJ SOLN
INTRAMUSCULAR | Status: DC | PRN
  Administered 2011-12-08: 4 mg via INTRAVENOUS

## 2011-12-08 MED ORDER — DEXTROSE 5 % IV SOLN
INTRAVENOUS | Status: DC | PRN
  Administered 2011-12-08: 13:00:00 via INTRAVENOUS

## 2011-12-08 MED ORDER — PROPOFOL 10 MG/ML IV EMUL
INTRAVENOUS | Status: DC | PRN
  Administered 2011-12-08: 160 mg via INTRAVENOUS

## 2011-12-08 MED ORDER — CHLORHEXIDINE GLUCONATE 0.12 % MT SOLN: 15.0000 mL | Freq: Two times a day (BID) | OROMUCOSAL | Status: DC

## 2011-12-08 MED ORDER — SODIUM CHLORIDE 0.9 % IV SOLN
INTRAVENOUS | Status: DC | PRN
  Administered 2011-12-08: 14:00:00

## 2011-12-08 NOTE — Op Note (Signed)
[facility] [address] [place], [place]  [phone]  ERCP PROCEDURE REPORT  PATIENT:  Nichole Wilson, Nichole Wilson  MR#:  [id number] BIRTHDATE:  [birth year]  GENDER:  female  ENDOSCOPIST:  Barbette Hair. Arlyce Dice, MD ASSISTANT:  Claudie Revering, RN CGRN, Windell Hummingbird, Technician  PROCEDURE DATE:  12/08/2011 PROCEDURE:  ERCP with removal of stones, ERCP with sphincterotomy, ERCP w/stent removal  INDICATIONS:  stone  MEDICATIONS:   MAC sedation, administered by CRNA TOPICAL ANESTHETIC:  DESCRIPTION OF PROCEDURE:   After the risks benefits and alternatives of the procedure were thoroughly explained, informed consent was obtained.  The Pentax ERCP [id number] endoscope was introduced through the mouth and advanced to the second portion of the duodenum.  A stent was present. Biliary and pancreatic ducts were removed with a cold polypectomy snare.  Multiple stones were found. CBD was cannulated over a 0.29mm wire. Multiple small filling defects were seen. A 13mm sphincterotomy was made. Multiple stones were removed with a 12mm balloon stone extractor with several sweeps of the CBD. Final cholangiogram was normal except for ductal dilitation (see image1, image6, and image5).    The scope was then completely withdrawn from the patient and the procedure terminated.  58cc contrast dye was injected. Floro time 3:38 minutes <<PROCEDUREIMAGES>>  COMPLICATIONS:  None  ENDOSCOPIC IMPRESSION:Multiple CBD stones, removed following sphincterotomy  RECOMMENDATIONS:Cholecystectomy  ______________________________ Barbette Hair. Arlyce Dice, MD  cc Dr. Sheryn Bison  CC:  n. REVISED:  12/08/2011 02:24 PM eSIGNED:   Barbette Hair. Germaine Ripp at 12/08/2011 02:24 PM  Delorise Royals, [phone]

## 2011-12-08 NOTE — Anesthesia Preprocedure Evaluation (Addendum)
Anesthesia Evaluation  Patient identified by MRN, date of birth, ID band Patient awake    Reviewed: Allergy & Precautions, H&P , NPO status , Patient's Chart, lab work & pertinent test results  History of Anesthesia Complications Negative for: history of anesthetic complications  Airway Mallampati: II  Neck ROM: full    Dental  (+) Caps,    Pulmonary    Pulmonary exam normal       Cardiovascular Exercise Tolerance: Good hypertension, Pt. on medications Rhythm:Regular     Neuro/Psych  Headaches,    GI/Hepatic negative GI ROS, Neg liver ROS,   Endo/Other  negative endocrine ROS  Renal/GU negative Renal ROS     Musculoskeletal negative musculoskeletal ROS (+)   Abdominal Normal abdominal exam  (+)   Peds  Hematology negative hematology ROS (+)   Anesthesia Other Findings   Reproductive/Obstetrics negative OB ROS                         Anesthesia Physical Anesthesia Plan  ASA: II  Anesthesia Plan: General   Post-op Pain Management:    Induction: Intravenous  Airway Management Planned: Oral ETT  Additional Equipment:   Intra-op Plan:   Post-operative Plan: Extubation in OR  Informed Consent: I have reviewed the patients History and Physical, chart, labs and discussed the procedure including the risks, benefits and alternatives for the proposed anesthesia with the patient or authorized representative who has indicated his/her understanding and acceptance.     Plan Discussed with: CRNA and Surgeon  Anesthesia Plan Comments:         Anesthesia Quick Evaluation

## 2011-12-08 NOTE — Anesthesia Postprocedure Evaluation (Signed)
  Anesthesia Post-op Note  Patient: Nichole Wilson  Procedure(s) Performed: Procedure(s) (LRB): ENDOSCOPIC RETROGRADE CHOLANGIOPANCREATOGRAPHY (ERCP) (N/A)  Patient Location: PACU  Anesthesia Type: General  Level of Consciousness: awake  Airway and Oxygen Therapy: Patient Spontanous Breathing  Post-op Pain: mild  Post-op Assessment: Post-op Vital signs reviewed, Patient's Cardiovascular Status Stable, Respiratory Function Stable, Patent Airway, No signs of Nausea or vomiting and Pain level controlled  Post-op Vital Signs: stable  Complications: No apparent anesthesia complications

## 2011-12-08 NOTE — Transfer of Care (Signed)
Immediate Anesthesia Transfer of Care Note  Patient: Nichole Wilson  Procedure(s) Performed: Procedure(s) (LRB): ENDOSCOPIC RETROGRADE CHOLANGIOPANCREATOGRAPHY (ERCP) (N/A)  Patient Location: PACU  Anesthesia Type: General  Level of Consciousness: awake, alert , oriented and patient cooperative  Airway & Oxygen Therapy: Patient Spontanous Breathing and Patient connected to nasal cannula oxygen  Post-op Assessment: Report given to PACU RN and Post -op Vital signs reviewed and stable  Post vital signs: Reviewed and stable  Complications: No apparent anesthesia complications

## 2011-12-08 NOTE — Discharge Summary (Signed)
Nichole Wilson Discharge Summary  Name: [name] MRN: [id number] DOB: 1961-06-11 51 y.o. PCP:  Marena Chancy, MD, MD  Date of Admission: 12/07/2011  1:17 PM Date of Discharge: 12/08/2011 Attending Physician: Louis Meckel, MD  Admitting Dignosis:  *  Acute pancreatitis  *  Choledocholithiasis  *  S/p ERCP 5/21with placement of stent to pancreatic and common bile duct.  Unable to remove large CBD stone.     Past Medical History  Diagnosis Date  . Hypertriglyceridemia   . Hepatic steatosis   . Breast cyst, left   . Migraine without aura, without mention of intractable migraine without mention of status migrainosus   . Complication of anesthesia 2002    temporary hearing loss  . Hypertension   . Hypercholesterolemia with hypertriglyceridemia   . Cholelithiasis with choledocholithiasis   . Glucose intolerance (impaired glucose tolerance)     since at least 2011.    Past Surgical History  Procedure Date  . Cesarean section     2002  . Ercp 12/05/2011    Procedure: ENDOSCOPIC RETROGRADE CHOLANGIOPANCREATOGRAPHY (ERCP);  Surgeon: Louis Meckel, MD;  Location: [facility];  Service: Endoscopy;  Laterality: N/A;     Discharge Diagnosis: Principal Problem:  *Acute pancreatitis  * Choledocholithiasis  *  S/p ERCP 12/08/2011, sphincterotomy, removal of multiple stones from CBD, removal of biliary and pancreatic duct stents  Consultations:  None  Radiology Dg Ercp With Sphincterotomy 12/08/2011  *RADIOLOGY REPORT*  Clinical Data: Common bile duct stones.  ERCP  Comparison:  12/05/2011  Technique:  Multiple spot images obtained with the fluoroscopic device and submitted for interpretation post-procedure.  ERCP was performed by Dr. Arlyce Dice.  Findings:  There is irregular contrast opacification on the first few images.  There may be some contrast extravasation outside of the biliary system but the location is uncertain.  There is cannulization and opacification of the  extrahepatic bile ducts. Wire was advanced into the intrahepatic bile ducts.  Filling defects within the common bile duct are compatible with stones.  A balloon was used for stone retrieval.  IMPRESSION: Choledocholithiasis and stone removal.  These images were submitted for radiologic interpretation only. Please see the procedural report for the amount of contrast and the fluoroscopy time utilized.  Original Report Authenticated By: Richarda Overlie, M.D.    GI Procedures:    ERCP 5/24  Admission HPI: Nichole Wilson is a 51 y.o. female. Seen at [facility] GI on 5/17 for one year history of abdominal gas, bloating, and they upper abdominal discomfort, worse over the last 4-6 weeks. Post-prandial fullness radiating epigastrum to back. Mild nausea, no emesis. No improvement with Prilisec. In recent days developed clay -colored stools, scleral icterus, dark urine. No fever or shills. Bilirubin 5 on 5/16, transaminases in 200's. Ultrasound showed multiple gallstones with dilated common bile duct and intrahepatic ducts.  Underwent ERCP on 5/21 by Dr Arlyce Dice. During which a total of 3 stents were placed in the pancreatic duct due to difficulty cannulating the CBD. The first 2 stents fell out spontaneously, the third was therefore placed. Pt had 2 mm sphincterotomy but, due to ductal spasm, Dr Arlyce Dice was unable to remove large distal CBD stone. There may have been other, non-visualized, stones as well. He then placed a stent into the CBD along with the previously placed pancreatic duct stent. [name]  He terminated the procedure and planned repeat ERCP in 2 weeks, to include full sphincterotomy and stone extraction. She was prescribed CIpro.  Pt called the GI  office 5/23 with c/o weakness, abdominal pain, malaise, nausea since the morning of 5/22, the morning after ERCP. Had labs at GI office 5/23. Lipase slightly elevated at 62. LFTs still elevated but T Bili down to 2.5 and transaminases also improved .  She was set up for  repeat ERCP 5/23 but, given mild pancreatitis and late hour of planned ERCP, Dr Arlyce Dice elected to admit pt overnight for ERCP on 5/24.    Hospital Course by problem list: 1.  Acute, mild, post-ERCP pancreatitis:   Pt was allowed clear liquids which she tolerated.  She did not require use of the PRN morphine or anti-nauseals.     2.  Choledocholithiases and long hx intermittent abdominal pain: Dr Arlyce Dice had not been able to remove stones from the bile duct at ERCP on 5/21.   Pt was brought back for ERCP on 5/24.  Following sphincterotomy, he was successful in removing retained stones in CBD.  Dr Arlyce Dice also removed the biliary and CBD stents he had placed on 5/21. She will not require continuation of abx at discharge.   3.  Cholelithiasis.   Pt has surgery appt on May 31st with Dr Jamey Ripa for consideration of Lap chole.   4.  Hyperglycemia/glucose intolerance.    Pt was not covered with SS insulin.  Her sugars will need to be followed as she has sugars ranging form 109 to 133 going back to July 2011.    Discharge Vitals:  BP 110/65  Pulse 102  Temp(Src) 99.2 F (37.3 C) (Oral)  Resp 23  Ht 5\' 2"  (1.575 m)  Wt 150 lb 14.4 oz (68.448 kg)  BMI 27.60 kg/m2  SpO2 97%  LMP 09/28/2011   Discharge Labs:  Results for orders placed during the hospital encounter of 12/07/11 (from the past 24 hour(s))  COMPREHENSIVE METABOLIC PANEL     Status: Abnormal   Collection Time   12/08/11  6:15 AM      Component Value Range   Sodium 139  135 - 145 (mEq/L)   Potassium 3.3 (*) 3.5 - 5.1 (mEq/L)   Chloride 102  96 - 112 (mEq/L)   CO2 25  19 - 32 (mEq/L)   Glucose, Bld 133 (*) 70 - 99 (mg/dL)   BUN 7  6 - 23 (mg/dL)   Creatinine, Ser 4.09  0.50 - 1.10 (mg/dL)   Calcium 9.2  8.4 - 81.1 (mg/dL)   Total Protein 6.6  6.0 - 8.3 (g/dL)   Albumin 3.0 (*) 3.5 - 5.2 (g/dL)   AST 24  0 - 37 (U/L)   ALT 131 (*) 0 - 35 (U/L)   Alkaline Phosphatase 233 (*) 39 - 117 (U/L)   Total Bilirubin 2.1 (*) 0.3 - 1.2  (mg/dL)   GFR calc non Af Amer >90  >90 (mL/min)   GFR calc Af Amer >90  >90 (mL/min)  LIPASE, BLOOD     Status: Normal   Collection Time   12/08/11  6:15 AM      Component Value Range   Lipase 37  11 - 59 (U/L)  CBC     Status: Abnormal   Collection Time   12/08/11  6:15 AM      Component Value Range   WBC 12.9 (*) 4.0 - 10.5 (K/uL)   RBC 3.58 (*) 3.87 - 5.11 (MIL/uL)   Hemoglobin 11.5 (*) 12.0 - 15.0 (g/dL)   HCT 91.4 (*) 78.2 - 46.0 (%)   MCV 94.4  78.0 - 100.0 (fL)  MCH 32.1  26.0 - 34.0 (pg)   MCHC 34.0  30.0 - 36.0 (g/dL)   RDW 16.1  09.6 - 04.5 (%)   Platelets 266  150 - 400 (K/uL)    Disposition and follow-up:   Ms.Julie Perra was discharged from  in healthy, improved, stable condition.    Follow-up Appointments: Dec 15, 1143 AM with Dr Jamey Ripa  Diet at discharge:  Low fat.   Discharge Orders    Future Appointments: Provider: Department: Dept Phone: Center:   12/25/2011 12:00 PM Currie Paris, MD [facility] [phone] None      Discharge Medications: Medication List  As of 12/08/2011  4:17 PM   STOP taking these medications         ciprofloxacin 500 MG tablet         TAKE these medications         aspirin 81 MG tablet   Take 81 mg by mouth daily.      atenolol-chlorthalidone 50-25 MG per tablet   Commonly known as: TENORETIC   Take 1 tablet by mouth daily.      fenofibrate 160 MG tablet   TAKE 1 TABLET (160 MG TOTAL) BY MOUTH DAILY.      ibuprofen 200 MG tablet   Commonly known as: ADVIL,MOTRIN   Take 200 mg by mouth every 6 (six) hours as needed. For pain      omeprazole 40 MG capsule   Commonly known as: PRILOSEC   Take 40 mg by mouth daily.      PROBIOTIC COLON SUPPORT PO   Take 1 tablet by mouth daily.      traMADol 50 MG tablet   Commonly known as: ULTRAM   Take 1 tablet (50 mg total) by mouth every 6 (six) hours as needed for pain.            Signed: Kasandra Knudsen  [phone] 12/08/2011, 4:17 PM

## 2011-12-08 NOTE — Interval H&P Note (Signed)
History and Physical Interval Note:  12/08/2011 12:50 PM  Nichole Wilson  has presented today for surgery, with the diagnosis of gallstones  The various methods of treatment have been discussed with the patient and family. After consideration of risks, benefits and other options for treatment, the patient has consented to  Procedure(s) (LRB): ENDOSCOPIC RETROGRADE CHOLANGIOPANCREATOGRAPHY (ERCP) (N/A) as a surgical intervention .  The patients' history has been reviewed, patient examined, no change in status, stable for surgery.  I have reviewed the patients' chart and labs.  Questions were answered to the patient's satisfaction.     Melvia Heaps   The recent H&P (dated *12/07/11**) was reviewed, the patient was examined and there is no change in the patients condition since that H&P was completed.   Melvia Heaps  12/08/2011, 12:50 PM

## 2011-12-08 NOTE — Progress Notes (Signed)
Patient discharged to home with instructions, verbalized understanding, escorted by husband.

## 2011-12-08 NOTE — Preoperative (Addendum)
Beta Blockers  Pt is an inpatient no beta blocker ordered. Atenolol/HCTZ last taken @ 12/07/11 8am

## 2011-12-12 ENCOUNTER — Encounter: Payer: Self-pay | Admitting: Family Medicine

## 2011-12-12 ENCOUNTER — Ambulatory Visit (INDEPENDENT_AMBULATORY_CARE_PROVIDER_SITE_OTHER): Payer: 59 | Admitting: Family Medicine

## 2011-12-12 VITALS — BP 100/70 | HR 84 | Temp 98.4°F | Ht 62.0 in | Wt 145.1 lb

## 2011-12-12 DIAGNOSIS — IMO0001 Reserved for inherently not codable concepts without codable children: Secondary | ICD-10-CM | POA: Insufficient documentation

## 2011-12-12 DIAGNOSIS — R61 Generalized hyperhidrosis: Secondary | ICD-10-CM

## 2011-12-12 DIAGNOSIS — E785 Hyperlipidemia, unspecified: Secondary | ICD-10-CM

## 2011-12-12 DIAGNOSIS — R300 Dysuria: Secondary | ICD-10-CM

## 2011-12-12 DIAGNOSIS — I10 Essential (primary) hypertension: Secondary | ICD-10-CM

## 2011-12-12 LAB — POCT URINALYSIS DIPSTICK
Nitrite, UA: NEGATIVE
Protein, UA: 30
pH, UA: 6

## 2011-12-12 LAB — POCT UA - MICROSCOPIC ONLY: Epithelial cells, urine per micros: >20

## 2011-12-12 LAB — BASIC METABOLIC PANEL
BUN: 11 mg/dL (ref 6–23)
CO2: 25 mEq/L (ref 19–32)
Chloride: 101 mEq/L (ref 96–112)
Creat: 0.52 mg/dL (ref 0.50–1.10)
Glucose, Bld: 138 mg/dL — ABNORMAL HIGH (ref 70–99)

## 2011-12-12 LAB — CBC
MCH: 31.6 pg (ref 26.0–34.0)
MCHC: 34.4 g/dL (ref 30.0–36.0)
MCV: 91.9 fL (ref 78.0–100.0)
Platelets: 474 10*3/uL — ABNORMAL HIGH (ref 150–400)

## 2011-12-12 NOTE — Progress Notes (Signed)
Addended by: [place], Zurisadai Helminiak on: 12/12/2011 11:58 AM   Modules accepted: Orders

## 2011-12-12 NOTE — Assessment & Plan Note (Addendum)
This is a nonspecific symptom, isolated in occurrence with some fatigue. Possibly related to perimenopause vs dehydration vs early infectious etiology. Given her recent hospitalization and procedural intervention, will check electrolytes, hemoglobin, and rule out infectious source with white blood count evaluation and urinalysis. Less likely a reaction to taking beta blocker with decreased volume status. In setting of low BP today, will plan to hold/half her blood pressure medication and increase fluid intake for next 1-2 days. Advised to restart medication if blood pressure greater than 140/90. No symptoms or signs of intra-abdominal infection with absence of fever and improvement in pain. No indication for further imaging at this time. Discussed with patient concerning symptoms including worsening or focal pain, fevers to seek medical care and may consider abdominal imaging if worsens. Reinforced with patient ultimate need for cholecystectomy, she has appointment with surgeon on May 31.

## 2011-12-12 NOTE — Progress Notes (Signed)
  Subjective:    Patient ID: Nichole Wilson, female    DOB: 07/16/1961, 51 y.o.   MRN: [id number]  HPI Hospital followup, discharge 5/24 for: choledocolithiasis and mild post ERCP pancreatitis.  1. Sweats/fatigue. Patient noted that yesterday morning she felt an episode of sweats in the morning and some fatigue since her recent discharge. The sweats were an isolated event. She has been checking her temperatures throughout the day and found her highest to be 99.2. Patient questions whether this is her typical hot flash, versus something more worrisome. Patient is nervous considering that she was discharged recently for gallstones and underwent ERCP x2 before ultimate removal of stone in CBD. Her LFTs, lipase were downtrending and she was discharged without need for antibiotics. Her abdominal pain and peritonitis is improved, she only has mild residual left flank discomfort. No requirement for pain medication. Last bowel movement was yesterday. She notes some mild dysuria yesterday. Does not know if Foley catheter was used during her procedures. She used topical Monistat yesterday.  Patient denies any polyuria, increased frequency, fevers, nausea, vomiting, diarrhea, orthostasis, palpitations, and dyspnea.  Review of Systems See HPI otherwise negative.  reports that she has never smoked. She has never used smokeless tobacco.    Objective:   Physical Exam  Vitals reviewed. Constitutional: She is oriented to person, place, and time. She appears well-developed and well-nourished. No distress.       Well-appearing, smiles and normal speech.  HENT:  Head: Normocephalic and atraumatic.  Mouth/Throat: Oropharynx is clear and moist.  Eyes: EOM are normal. Pupils are equal, round, and reactive to light.  Cardiovascular: Normal rate, regular rhythm, normal heart sounds and intact distal pulses.   No murmur heard. Pulmonary/Chest: Effort normal. No respiratory distress.  Abdominal: Soft.       BS present.  Mild distention, no guarding or rebound. Mild TTP with deepest palpation in left side.  Musculoskeletal: She exhibits no edema and no tenderness.  Neurological: She is alert and oriented to person, place, and time. No cranial nerve deficit.       No orthostatic symptoms, gait normal.  Psychiatric: She has a normal mood and affect.       Assessment & Plan:

## 2011-12-12 NOTE — Patient Instructions (Signed)
I do not think your sweats are too worrisome. May be beginning of urinary infection. No signs of abdomen infection now-look out for fevers, worsened pains or more focal pain. Your blood pressure is mildly low, we will check your blood counts also. Make sure to drink plenty of fluids in then next several days. You may skip your blood pressure medicine today. IF higher than 140/90, then you should restart this medicine.

## 2011-12-13 ENCOUNTER — Telehealth: Payer: Self-pay | Admitting: *Deleted

## 2011-12-13 LAB — LIPID PANEL
LDL Cholesterol: 114 mg/dL — ABNORMAL HIGH (ref 0–99)
Total CHOL/HDL Ratio: 4.7 Ratio

## 2011-12-13 NOTE — Telephone Encounter (Signed)
Message copied by Farrell Ours on Wed Dec 13, 2011 11:38 AM ------      Message from: Durwin Reges      Created: Wed Dec 13, 2011  7:07 AM      Regarding: normal results       Please inform patient her blood tests were all normal. No sign of infection in urine.

## 2011-12-13 NOTE — Telephone Encounter (Signed)
Poke with patient and informed her of below 

## 2011-12-14 ENCOUNTER — Encounter: Payer: Self-pay | Admitting: Family Medicine

## 2011-12-15 ENCOUNTER — Ambulatory Visit (INDEPENDENT_AMBULATORY_CARE_PROVIDER_SITE_OTHER): Payer: Commercial Managed Care - PPO | Admitting: Surgery

## 2011-12-25 ENCOUNTER — Encounter: Payer: Self-pay | Admitting: Family Medicine

## 2011-12-25 ENCOUNTER — Encounter (INDEPENDENT_AMBULATORY_CARE_PROVIDER_SITE_OTHER): Payer: Self-pay | Admitting: Surgery

## 2011-12-25 ENCOUNTER — Ambulatory Visit (INDEPENDENT_AMBULATORY_CARE_PROVIDER_SITE_OTHER): Payer: Commercial Managed Care - PPO | Admitting: Surgery

## 2011-12-25 VITALS — BP 110/76 | HR 84 | Temp 98.4°F | Resp 12 | Ht 62.0 in | Wt 148.8 lb

## 2011-12-25 DIAGNOSIS — K8010 Calculus of gallbladder with chronic cholecystitis without obstruction: Secondary | ICD-10-CM

## 2011-12-25 HISTORY — DX: Calculus of gallbladder with chronic cholecystitis without obstruction: K80.10

## 2011-12-25 NOTE — Progress Notes (Signed)
Patient ID: Nichole Wilson, female   DOB: [birth year], 51 y.o.   MRN: [id number]  Chief Complaint  Patient presents with  . Abdominal Pain    new pt- eval GB    HPI Nichole Wilson is a 51 y.o. female.  She was found to have gallstones and common duct stones. She underwent ERCP X2 last month with retrieval of the stones from the CBD and spinterotomy. She comes today to discuss cholecystectomy. She has had some mild bloating bu no more significant pain since the ercp HPI  Past Medical History  Diagnosis Date  . Hypertriglyceridemia   . Hepatic steatosis   . Breast cyst, left   . Migraine without aura, without mention of intractable migraine without mention of status migrainosus   . Complication of anesthesia 2002    temporary hearing loss  . Hypertension   . Hypercholesterolemia with hypertriglyceridemia   . Cholelithiasis with choledocholithiasis   . Glucose intolerance (impaired glucose tolerance)     since at least 2011.     Past Surgical History  Procedure Date  . Cesarean section     2002  . Ercp 12/05/2011    Procedure: ENDOSCOPIC RETROGRADE CHOLANGIOPANCREATOGRAPHY (ERCP);  Surgeon: Louis Meckel, MD;  Location: [facility];  Service: Endoscopy;  Laterality: N/A;  . Ercp 12/08/2011    Procedure: ENDOSCOPIC RETROGRADE CHOLANGIOPANCREATOGRAPHY (ERCP);  Surgeon: Louis Meckel, MD;  Location: [facility];  Service: Gastroenterology;  Laterality: N/A;    Family History  Problem Relation Age of Onset  . Diabetes Mother   . Diabetes Father   . Diabetes Brother     Social History History  Substance Use Topics  . Smoking status: Never Smoker   . Smokeless tobacco: Never Used  . Alcohol Use: No    No Known Allergies  Current Outpatient Prescriptions  Medication Sig Dispense Refill  . fenofibrate 160 MG tablet TAKE 1 TABLET (160 MG TOTAL) BY MOUTH DAILY.  30 tablet  3  . Probiotic Product (PROBIOTIC COLON SUPPORT PO) Take 1 tablet by mouth daily.        Review of  Systems Review of Systems  Constitutional: Negative for fever, chills and unexpected weight change.  HENT: Negative for hearing loss, congestion, sore throat, trouble swallowing and voice change.   Eyes: Negative for visual disturbance.  Respiratory: Negative for cough and wheezing.   Cardiovascular: Negative for chest pain, palpitations and leg swelling.  Gastrointestinal: Positive for constipation, blood in stool, anal bleeding and rectal pain. Negative for nausea, vomiting, abdominal pain, diarrhea and abdominal distention.  Genitourinary: Negative for hematuria, vaginal bleeding and difficulty urinating.  Musculoskeletal: Negative for arthralgias.  Skin: Negative for rash and wound.  Neurological: Negative for seizures, syncope and headaches.  Hematological: Negative for adenopathy. Does not bruise/bleed easily.  Psychiatric/Behavioral: Negative for confusion.    Blood pressure 110/76, pulse 84, temperature 98.4 F (36.9 C), temperature source Temporal, resp. rate 12, height 5\' 2"  (1.575 m), weight 148 lb 12.8 oz (67.495 kg), last menstrual period 09/28/2011.  Physical Exam Physical Exam  Vitals reviewed. Constitutional: She is oriented to person, place, and time. She appears well-developed and well-nourished. No distress.  HENT:  Head: Normocephalic and atraumatic.  Mouth/Throat: Oropharynx is clear and moist.  Eyes: Conjunctivae and EOM are normal. Pupils are equal, round, and reactive to light. No scleral icterus.  Neck: Normal range of motion. Neck supple. No tracheal deviation present. No thyromegaly present.  Cardiovascular: Normal rate, regular rhythm, normal heart sounds and intact distal pulses.  Exam reveals no gallop and no friction rub.   No murmur heard. Pulmonary/Chest: Effort normal and breath sounds normal. No respiratory distress. She has no wheezes. She has no rales.  Abdominal: Soft. Bowel sounds are normal. She exhibits no distension and no mass. There is no  tenderness. There is no rebound and no guarding.  Musculoskeletal: Normal range of motion. She exhibits no edema and no tenderness.  Neurological: She is alert and oriented to person, place, and time.  Skin: Skin is warm and dry. No rash noted. She is not diaphoretic. No erythema.  Psychiatric: She has a normal mood and affect. Her behavior is normal. Judgment and thought content normal.    Data Reviewed I have reviewed the xrays ercp notes and progress notes in Epic  Assessment    Cholelithiasis,chronic cholecystitis    Plan    Recommend Lap chole and cholangiogram I have discussed the indications for laparoscopic cholecystectomy with her and provided educational material. We have discussed the risks of surgery, including general risks such as bleeding, infection, lung and heart issues etc. We have also discussed the potential for injuries to other organs, bile duct leaks, and other unexpected events. We have also talked about the fact that this may need to be converted to open under certain circumstances. We discussed the typical post op recovery and the fact that there is a good likelihood of improvement in symptoms and return to normal activity.  She understands this and wishes to proceed to schedule surgery. I believe all of her questions have been answered.She would like to schedule.         Nichole Wilson 12/25/2011, 12:13 PM

## 2011-12-25 NOTE — Patient Instructions (Signed)
We will schedule surgery to remove your gallbladder 

## 2012-01-01 ENCOUNTER — Encounter: Payer: Self-pay | Admitting: Family Medicine

## 2012-01-01 ENCOUNTER — Ambulatory Visit (INDEPENDENT_AMBULATORY_CARE_PROVIDER_SITE_OTHER): Payer: 59 | Admitting: Family Medicine

## 2012-01-01 ENCOUNTER — Other Ambulatory Visit (HOSPITAL_COMMUNITY)
Admission: RE | Admit: 2012-01-01 | Discharge: 2012-01-01 | Disposition: A | Discharge status: Home / Self Care | Payer: 59 | Source: Ambulatory Visit | Attending: Family Medicine | Admitting: Family Medicine

## 2012-01-01 VITALS — BP 111/72 | HR 86 | Temp 97.1°F | Ht 62.0 in | Wt 150.0 lb

## 2012-01-01 DIAGNOSIS — E785 Hyperlipidemia, unspecified: Secondary | ICD-10-CM

## 2012-01-01 DIAGNOSIS — N760 Acute vaginitis: Secondary | ICD-10-CM

## 2012-01-01 DIAGNOSIS — Z124 Encounter for screening for malignant neoplasm of cervix: Secondary | ICD-10-CM

## 2012-01-01 DIAGNOSIS — N951 Menopausal and female climacteric states: Secondary | ICD-10-CM

## 2012-01-01 DIAGNOSIS — K760 Fatty (change of) liver, not elsewhere classified: Secondary | ICD-10-CM

## 2012-01-01 DIAGNOSIS — K649 Unspecified hemorrhoids: Secondary | ICD-10-CM

## 2012-01-01 DIAGNOSIS — K7689 Other specified diseases of liver: Secondary | ICD-10-CM

## 2012-01-01 DIAGNOSIS — Z01419 Encounter for gynecological examination (general) (routine) without abnormal findings: Secondary | ICD-10-CM | POA: Insufficient documentation

## 2012-01-01 LAB — POCT WET PREP (WET MOUNT): Clue Cells Wet Prep Whiff POC: NEGATIVE

## 2012-01-01 NOTE — Patient Instructions (Addendum)
Continue with healthy diet changes, extra fiber and stool softeners. You will get called if test is abnormal, and a letter if it is normal. Make sure to have a colonoscopy after your surgery is done, for screening of colon cancer.  Hypercholesterolemia High Blood Cholesterol Cholesterol is a white, waxy, fat-like protein needed by your body in small amounts. The liver makes all the cholesterol you need. It is carried from the liver by the blood through the blood vessels. Deposits (plaque) may build up on blood vessel walls. This makes the arteries narrower and stiffer. Plaque increases the risk for heart attack and stroke. You cannot feel your cholesterol level even if it is very high. The only way to know is by a blood test to check your lipid (fats) levels. Once you know your cholesterol levels, you should keep a record of the test results. Work with your caregiver to to keep your levels in the desired range. WHAT THE RESULTS MEAN:  Total cholesterol is a rough measure of all the cholesterol in your blood.   LDL is the so-called bad cholesterol. This is the type that deposits cholesterol in the walls of the arteries. You want this level to be low.   HDL is the good cholesterol because it cleans the arteries and carries the LDL away. You want this level to be high.   Triglycerides are fat that the body can either burn for energy or store. High levels are closely linked to heart disease.  DESIRED LEVELS:  Total cholesterol below 200.   LDL below 100 for people at risk, below 70 for very high risk.   HDL above 50 is good, above 60 is best.   Triglycerides below 150.  HOW TO LOWER YOUR CHOLESTEROL:  Diet.   Choose fish or white meat chicken and [place], roasted or baked. Limit fatty cuts of red meat, fried foods, and processed meats, such as sausage and lunch meat.   Eat lots of fresh fruits and vegetables. Choose whole grains, beans, pasta, potatoes and cereals.   Use only small  amounts of olive, corn or canola oils. Avoid butter, mayonnaise, shortening or palm kernel oils. Avoid foods with trans-fats.   Use skim/nonfat milk and low-fat/nonfat yogurt and cheeses. Avoid whole milk, cream, ice cream, egg yolks and cheeses. Healthy desserts include angel food cake, gingersnaps, animal crackers, hard candy, popsicles, and low-fat/nonfat frozen yogurt. Avoid pastries, cakes, pies and cookies.   Exercise.   A regular program helps decrease LDL and raises HDL.   Helps with weight control.   Do things that increase your activity level like gardening, walking, or taking the stairs.   Medication.   May be prescribed by your caregiver to help lowering cholesterol and the risk for heart disease.   You may need medicine even if your levels are normal if you have several risk factors.  HOME CARE INSTRUCTIONS   Follow your diet and exercise programs as suggested by your caregiver.   Take medications as directed.   Have blood work done when your caregiver feels it is necessary.  MAKE SURE YOU:   Understand these instructions.   Will watch your condition.   Will get help right away if you are not doing well or get worse.  Document Released: 07/03/2005 Document Revised: 06/22/2011 Document Reviewed: 12/19/2006 [facility] Patient Information 2012 [place], [place].

## 2012-01-02 ENCOUNTER — Encounter: Payer: Self-pay | Admitting: Family Medicine

## 2012-01-02 DIAGNOSIS — K760 Fatty (change of) liver, not elsewhere classified: Secondary | ICD-10-CM | POA: Insufficient documentation

## 2012-01-02 NOTE — Progress Notes (Signed)
Subjective:     Nichole Wilson is a 51 y.o. female and is here for a comprehensive physical exam. The patient reports problems - abdominal bloating since her ERCP. planning for cholecystectomy later this month. also some breast tendeness intermittently and mentstrual irregularities-spacing to menses q3 months. Had some rectal pain and small bleeding after her ERCP while on oral narcotics, caused constipation. This has improved with stool softeners and dietary changes-eating a lot of papaya/fiber.  Not sexually active in >3 months-husband with back pain issues. Married.   History   Social History  . Marital Status: Married    Spouse Name: N/A    Number of Children: 2  . Years of Education: N/A   Occupational History  . 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    Social History Main Topics  . Smoking status: Never Smoker   . Smokeless tobacco: Never Used  . Alcohol Use: No  . Drug Use: No  . Sexually Active: Yes   Other Topics Concern  . Not on file   Social History Narrative   No caffein   Health Maintenance  Topic Date Due  . Tetanus/tdap  07/18/2011  . Pap Smear  02/24/2012  . Influenza Vaccine  04/16/2012    The following portions of the patient's history were reviewed and updated as appropriate: allergies, current medications, past family history, past medical history, past social history, past surgical history and problem list.  Review of Systems Pertinent items are noted in HPI.   Objective:    BP 111/72  Pulse 86  Temp 97.1 F (36.2 C) (Oral)  Ht 5\' 2"  (1.575 m)  Wt 150 lb (68.04 kg)  BMI 27.44 kg/m2  LMP 09/28/2011 General appearance: alert, cooperative, appears stated age and no distress Head: Normocephalic, without obvious abnormality, atraumatic Eyes: conjunctivae/corneas clear. PERRL, EOM's intact.  Nose: Nares normal. Septum midline. Mucosa normal. No drainage or sinus tenderness. Throat: lips, mucosa, and  tongue normal; teeth and gums normal Back: symmetric, no curvature. ROM normal. No CVA tenderness. Lungs: clear to auscultation bilaterally Breasts: normal appearance, no masses or tenderness Heart: regular rate and rhythm, S1, S2 normal, no murmur, click, rub or gallop Abdomen: soft, non-tender; bowel sounds normal; no masses,  no organomegaly Pelvic: cervix normal in appearance, external genitalia normal, no adnexal masses or tenderness, no cervical motion tenderness, uterus normal size, shape, and consistency, vagina normal without discharge and small, nontender, non-thrombosed and reducible hemorrhoid at 6-9 oclock. Extremities: extremities normal, atraumatic, no cyanosis or edema Skin: Skin color, texture, turgor normal. No rashes or lesions Neurologic: Grossly normal    Assessment:    Healthy female exam.  Cholelithiasis, awaiting surgery.  Perimenopausal.  External hemorrhoid. Hyperlipidemia-improved.  Lab Results  Component Value Date   CHOL 192 12/12/2011   HDL 41 12/12/2011   LDLCALC 114* 12/12/2011   TRIG 187* 12/12/2011   CHOLHDL 4.7 12/12/2011         Plan:   Pap today, last normal 3 years ago. Planning on mammogram next month, then to consider colonoscopy after her cholecystectomy.  Recommend continued high fiber, sitz baths for hemorrhoid. If worsens may refer to surgery. Patient refuses pregnancy test today as she is abstinent for months. Likely perimenopausal with amenorrhea 3 months and hot flashes.  Hyperlipidemia-stable on fibrate, discussed dietary modifications. Hepatic steatosis on [place] this year. F/u in 2-3 years. Advised patient to monitor abdominal bloating and will reassess after cholecystectomy. May need repeat  imaging.

## 2012-01-03 ENCOUNTER — Telehealth: Payer: Self-pay | Admitting: *Deleted

## 2012-01-03 ENCOUNTER — Other Ambulatory Visit (INDEPENDENT_AMBULATORY_CARE_PROVIDER_SITE_OTHER): Payer: Self-pay | Admitting: Surgery

## 2012-01-03 ENCOUNTER — Encounter (HOSPITAL_BASED_OUTPATIENT_CLINIC_OR_DEPARTMENT_OTHER): Payer: Self-pay | Admitting: Family Medicine

## 2012-01-03 ENCOUNTER — Encounter (HOSPITAL_COMMUNITY): Payer: Self-pay | Admitting: Pharmacy Technician

## 2012-01-03 NOTE — Telephone Encounter (Signed)
Dr. Cristal Ford, Patient is wanting to get another Korea of her gallbladder. Can we order one for he. She came in and requested

## 2012-01-04 ENCOUNTER — Encounter (HOSPITAL_COMMUNITY): Payer: Self-pay

## 2012-01-04 ENCOUNTER — Encounter (HOSPITAL_COMMUNITY)
Admission: RE | Admit: 2012-01-04 | Discharge: 2012-01-04 | Disposition: A | Discharge status: Home / Self Care | Payer: 59 | Source: Ambulatory Visit | Attending: Surgery | Admitting: Surgery

## 2012-01-04 DIAGNOSIS — N6002 Solitary cyst of left breast: Secondary | ICD-10-CM | POA: Insufficient documentation

## 2012-01-04 DIAGNOSIS — Z01812 Encounter for preprocedural laboratory examination: Secondary | ICD-10-CM | POA: Insufficient documentation

## 2012-01-04 DIAGNOSIS — I10 Essential (primary) hypertension: Secondary | ICD-10-CM | POA: Insufficient documentation

## 2012-01-04 HISTORY — DX: Solitary cyst of left breast: N60.02

## 2012-01-04 HISTORY — DX: Essential (primary) hypertension: I10

## 2012-01-04 LAB — COMPREHENSIVE METABOLIC PANEL
ALT: 24 U/L (ref 0–35)
Alkaline Phosphatase: 113 U/L (ref 39–117)
CO2: 24 mEq/L (ref 19–32)
GFR calc Af Amer: >90 mL/min (ref >90)
GFR calc non Af Amer: >90 mL/min (ref >90)
Glucose, Bld: 114 mg/dL — ABNORMAL HIGH (ref 70–99)
Potassium: 3.8 mEq/L (ref 3.5–5.1)
Sodium: 137 mEq/L (ref 135–145)
Total Bilirubin: 0.5 mg/dL (ref 0.3–1.2)

## 2012-01-04 LAB — DIFFERENTIAL
Basophils Absolute: 0 10*3/uL (ref 0.0–0.1)
Basophils Relative: 0 % (ref 0–1)
Eosinophils Absolute: 0.1 10*3/uL (ref 0.0–0.7)
Eosinophils Relative: 1 % (ref 0–5)

## 2012-01-04 LAB — LIPASE, BLOOD: Lipase: 43 U/L (ref 11–59)

## 2012-01-04 LAB — CBC
HCT: 36.6 % (ref 36.0–46.0)
Hemoglobin: 12.4 g/dL (ref 12.0–15.0)
RBC: 3.83 MIL/uL — ABNORMAL LOW (ref 3.87–5.11)

## 2012-01-04 LAB — SURGICAL PCR SCREEN: MRSA, PCR: NEGATIVE

## 2012-01-04 LAB — HCG, SERUM, QUALITATIVE: Preg, Serum: NEGATIVE

## 2012-01-04 NOTE — Patient Instructions (Addendum)
20 ORAH SONNEN  01/04/2012   Your procedure is scheduled on:  7-1 -2013  Report to [facility] at     0700   AM..  Call this number if you have problems the morning of surgery: [phone]   Remember:   Do not eat food:After Midnight.    Take these medicines the morning of surgery with A SIP OF WATER: none   Do not wear jewelry, make-up or nail polish.  Do not wear lotions, powders, or perfumes. You may wear deodorant.  Do not shave 48 hours prior to surgery.(face and neck okay, no shaving of legs)  Do not bring valuables to the hospital.  Contacts, dentures or bridgework may not be worn into surgery.  Leave suitcase in the car. After surgery it may be brought to your room.  For patients admitted to the hospital, checkout time is 11:00 AM the day of discharge.   Patients discharged the day of surgery will not be allowed to drive home.  Name and phone number of your driver: family  Special Instructions: CHG Shower Use Special Wash: 1/2 bottle night before surgery and 1/2 bottle morning of surgery.(avoid face and genitals)   Please read over the following fact sheets that you were given: MRSA Information.

## 2012-01-04 NOTE — Pre-Procedure Instructions (Addendum)
01-04-12 EKG(12-05-11) report with chart. 01-12-12 1030 AM pt. Called to say she had contacted Dr. Tenna Child office and have cancelled surgery for 01-15-12.     W. Kennon Portela

## 2012-01-05 ENCOUNTER — Encounter (HOSPITAL_COMMUNITY): Admission: RE | Payer: Self-pay | Source: Ambulatory Visit

## 2012-01-05 ENCOUNTER — Ambulatory Visit (HOSPITAL_COMMUNITY): Admission: RE | Admit: 2012-01-05 | Payer: 59 | Source: Ambulatory Visit | Admitting: Surgery

## 2012-01-05 SURGERY — LAPAROSCOPIC CHOLECYSTECTOMY WITH INTRAOPERATIVE CHOLANGIOGRAM
Anesthesia: General

## 2012-01-09 NOTE — Telephone Encounter (Signed)
I returned call to patient. She states she wants another Korea of gallbladder to see if the problem cleared so that she can avoid surgery. I informed her that the stones in GB don't usually all resolve, and discussed she most likely should discuss this with [facility]. She states she will call her surgeon office who she is scheduled to f/u with in July to determine if she needs another imaging test. She denies any pain, swelling, etc.

## 2012-01-15 ENCOUNTER — Encounter (HOSPITAL_COMMUNITY): Admission: RE | Payer: Self-pay | Source: Ambulatory Visit

## 2012-01-15 ENCOUNTER — Ambulatory Visit (HOSPITAL_COMMUNITY): Admission: RE | Admit: 2012-01-15 | Payer: 59 | Source: Ambulatory Visit | Admitting: Surgery

## 2012-01-15 SURGERY — LAPAROSCOPIC CHOLECYSTECTOMY WITH INTRAOPERATIVE CHOLANGIOGRAM
Anesthesia: General

## 2012-01-30 ENCOUNTER — Telehealth: Payer: Self-pay | Admitting: *Deleted

## 2012-01-30 NOTE — Telephone Encounter (Signed)
Pt never had her Lap Chole; everything was cancelled in EPIC. I called [facility] and all they know is pt called and cancelled her appt. lmom for pt to call back.

## 2012-01-30 NOTE — Telephone Encounter (Signed)
Message copied by Florene Glen on Tue Jan 30, 2012  2:41 PM ------      Message from: Harlow Mares D      Created: Fri Dec 29, 2011  2:49 PM                   ----- Message -----         From: Leonette Monarch, CMA         Sent: 01/30/2012           To: Leonette Monarch, CMA            Call pt to schedule direct colon and previsit if everything with gallbladder is taken care of

## 2012-01-31 NOTE — Telephone Encounter (Signed)
Pt reports she had to cancel the Lap Chole d/t family issues. She has tried to watch her diet and hasn't had anymore bouts. Reminded her I was calling to schedule a Direct Colon and PV and she would like for me to call and remind her in a couple of months. Reminder in.

## 2012-02-23 ENCOUNTER — Other Ambulatory Visit (INDEPENDENT_AMBULATORY_CARE_PROVIDER_SITE_OTHER): Payer: Self-pay | Admitting: Surgery

## 2012-02-26 ENCOUNTER — Telehealth (INDEPENDENT_AMBULATORY_CARE_PROVIDER_SITE_OTHER): Payer: Self-pay | Admitting: General Surgery

## 2012-02-26 NOTE — Telephone Encounter (Signed)
Per Dr Maud Deed - I orders back into Epic for her gallbladder surgery. It's been 2 months since I have seen her so she needs to have a preop visit. Patient is scheduled 03/25/12 for surgery. Preop appt made 03/22/12 with patient.

## 2012-03-01 ENCOUNTER — Other Ambulatory Visit (INDEPENDENT_AMBULATORY_CARE_PROVIDER_SITE_OTHER): Payer: Self-pay

## 2012-03-01 ENCOUNTER — Telehealth (INDEPENDENT_AMBULATORY_CARE_PROVIDER_SITE_OTHER): Payer: Self-pay

## 2012-03-01 DIAGNOSIS — K8020 Calculus of gallbladder without cholecystitis without obstruction: Secondary | ICD-10-CM

## 2012-03-01 NOTE — Telephone Encounter (Signed)
Pt requests repeat ultrasound of her gallbladder. She states she wants confirmation that she still has stones prior to having her surgery. She states she understands insurance may not cover a repeat ultrasound with out indication. Pt states she understands but also knows her rights and requests the ultrasound be done. Pt advised I will send request to Dr Jamey Ripa for review.

## 2012-03-01 NOTE — Telephone Encounter (Signed)
Called pt to give her the abd. U/s appt with [facility] on 03/05/12 arrive at 8:30. The pt understands.

## 2012-03-01 NOTE — Telephone Encounter (Signed)
OK to obtain repeat gb ultrasound

## 2012-03-05 ENCOUNTER — Other Ambulatory Visit: Payer: 59

## 2012-03-07 ENCOUNTER — Telehealth (INDEPENDENT_AMBULATORY_CARE_PROVIDER_SITE_OTHER): Payer: Self-pay | Admitting: General Surgery

## 2012-03-07 NOTE — Telephone Encounter (Signed)
Let pt know that we rescheduled her Korea with [place] on 8/26 at 7:45 and explained that she should be NPO after midnight.  She was fine with this.

## 2012-03-07 NOTE — Telephone Encounter (Signed)
LMOM asking pt to return my call. °

## 2012-03-07 NOTE — Telephone Encounter (Signed)
Message copied by Littie Deeds on Thu Mar 07, 2012  3:10 PM ------      Message from: Matilde Sprang      Created: Mon Mar 04, 2012  1:15 PM      Regarding: reschedule      Contact: [phone]       Pt is scheduled for GB ultrasound at [facility], but is employee of [facility] [employer].  Can it be rescheduled within the [facility] system so she will not have to pay?  Please call her.  Thanks, bp

## 2012-03-11 ENCOUNTER — Other Ambulatory Visit (INDEPENDENT_AMBULATORY_CARE_PROVIDER_SITE_OTHER): Payer: Self-pay | Admitting: Surgery

## 2012-03-11 ENCOUNTER — Ambulatory Visit (HOSPITAL_COMMUNITY)
Admission: RE | Admit: 2012-03-11 | Discharge: 2012-03-11 | Disposition: A | Discharge status: Home / Self Care | Payer: 59 | Source: Ambulatory Visit | Attending: Surgery | Admitting: Surgery

## 2012-03-11 DIAGNOSIS — I10 Essential (primary) hypertension: Secondary | ICD-10-CM | POA: Insufficient documentation

## 2012-03-11 DIAGNOSIS — K8020 Calculus of gallbladder without cholecystitis without obstruction: Secondary | ICD-10-CM | POA: Insufficient documentation

## 2012-03-14 ENCOUNTER — Encounter (HOSPITAL_COMMUNITY): Payer: Self-pay | Admitting: Pharmacy Technician

## 2012-03-19 ENCOUNTER — Other Ambulatory Visit: Payer: Self-pay | Admitting: Family Medicine

## 2012-03-19 ENCOUNTER — Telehealth: Payer: Self-pay | Admitting: *Deleted

## 2012-03-19 DIAGNOSIS — Z1231 Encounter for screening mammogram for malignant neoplasm of breast: Secondary | ICD-10-CM

## 2012-03-19 NOTE — Telephone Encounter (Signed)
Called to schedule for a Direct Colon and PV, but she is scheduled for a lap chole this week. I will call her later.

## 2012-03-19 NOTE — Telephone Encounter (Signed)
Message copied by Florene Glen on Tue Mar 19, 2012 10:16 AM ------      Message from: Florene Glen      Created: Wed Jan 31, 2012  4:31 PM       Call to schedule direct colon and PV

## 2012-03-22 ENCOUNTER — Ambulatory Visit (INDEPENDENT_AMBULATORY_CARE_PROVIDER_SITE_OTHER): Payer: Commercial Managed Care - PPO | Admitting: Surgery

## 2012-03-22 ENCOUNTER — Encounter (INDEPENDENT_AMBULATORY_CARE_PROVIDER_SITE_OTHER): Payer: Self-pay | Admitting: Surgery

## 2012-03-22 ENCOUNTER — Ambulatory Visit
Admission: RE | Admit: 2012-03-22 | Discharge: 2012-03-22 | Disposition: A | Discharge status: Home / Self Care | Payer: 59 | Source: Ambulatory Visit | Attending: Family Medicine | Admitting: Family Medicine

## 2012-03-22 ENCOUNTER — Encounter (HOSPITAL_COMMUNITY): Payer: Self-pay

## 2012-03-22 ENCOUNTER — Encounter (HOSPITAL_COMMUNITY)
Admission: RE | Admit: 2012-03-22 | Discharge: 2012-03-22 | Disposition: A | Discharge status: Home / Self Care | Payer: 59 | Source: Ambulatory Visit | Attending: Surgery | Admitting: Surgery

## 2012-03-22 VITALS — BP 120/84 | HR 92 | Temp 97.6°F | Resp 18 | Ht 62.0 in | Wt 153.0 lb

## 2012-03-22 DIAGNOSIS — Z1231 Encounter for screening mammogram for malignant neoplasm of breast: Secondary | ICD-10-CM

## 2012-03-22 DIAGNOSIS — K8010 Calculus of gallbladder with chronic cholecystitis without obstruction: Secondary | ICD-10-CM

## 2012-03-22 LAB — URINALYSIS, ROUTINE W REFLEX MICROSCOPIC
Glucose, UA: NEGATIVE mg/dL
Protein, ur: NEGATIVE mg/dL
Specific Gravity, Urine: 1.024 (ref 1.005–1.030)
pH: 6 (ref 5.0–8.0)

## 2012-03-22 LAB — CBC WITH DIFFERENTIAL/PLATELET
Basophils Absolute: 0 10*3/uL (ref 0.0–0.1)
Basophils Relative: 0 % (ref 0–1)
Eosinophils Relative: 1 % (ref 0–5)
HCT: 37.9 % (ref 36.0–46.0)
MCHC: 34.3 g/dL (ref 30.0–36.0)
MCV: 92 fL (ref 78.0–100.0)
Monocytes Absolute: 0.2 10*3/uL (ref 0.1–1.0)
Platelets: 282 10*3/uL (ref 150–400)
RDW: 13.1 % (ref 11.5–15.5)
WBC: 4.6 10*3/uL (ref 4.0–10.5)

## 2012-03-22 LAB — COMPREHENSIVE METABOLIC PANEL
ALT: 35 U/L (ref 0–35)
AST: 30 U/L (ref 0–37)
Albumin: 4 g/dL (ref 3.5–5.2)
Calcium: 9.3 mg/dL (ref 8.4–10.5)
Creatinine, Ser: 0.52 mg/dL (ref 0.50–1.10)
Sodium: 139 mEq/L (ref 135–145)
Total Protein: 7.3 g/dL (ref 6.0–8.3)

## 2012-03-22 NOTE — Patient Instructions (Signed)
I will see you Monday at the hospital just before we do your surgery

## 2012-03-22 NOTE — Patient Instructions (Signed)
YOUR SURGERY IS SCHEDULED ON:  Monday  03/25/12  AT 7:30 AM  REPORT TO [facility] AT: 5:30 AM      PHONE # FOR SHORT STAY IS [phone]  DO NOT EAT OR DRINK ANYTHING AFTER MIDNIGHT THE NIGHT BEFORE YOUR SURGERY.  YOU MAY BRUSH YOUR TEETH, RINSE OUT YOUR MOUTH--BUT NO WATER, NO FOOD, NO CHEWING GUM, NO MINTS, NO CANDIES, NO CHEWING TOBACCO.  PLEASE TAKE THE FOLLOWING MEDICATIONS THE AM OF YOUR SURGERY WITH A FEW SIPS OF WATER:  NO MEDS TO TAKE    DO NOT BRING VALUABLES, MONEY, CREDIT CARDS.  CONTACT LENS, DENTURES / PARTIALS, GLASSES SHOULD NOT BE WORN TO SURGERY AND IN MOST CASES-HEARING AIDS WILL NEED TO BE REMOVED.  BRING YOUR GLASSES CASE, ANY EQUIPMENT NEEDED FOR YOUR CONTACT LENS. FOR PATIENTS ADMITTED TO THE HOSPITAL--CHECK OUT TIME THE DAY OF DISCHARGE IS 11:00 AM.  ALL INPATIENT ROOMS ARE PRIVATE - WITH BATHROOM, TELEPHONE, TELEVISION AND WIFI INTERNET. IF YOU ARE BEING DISCHARGED THE SAME DAY OF YOUR SURGERY--YOU CAN NOT DRIVE YOURSELF HOME--AND SHOULD NOT GO HOME ALONE BY TAXI OR BUS.  NO DRIVING OR OPERATING MACHINERY FOR 24 HOURS FOLLOWING ANESTHESIA / PAIN MEDICATIONS.                            SPECIAL INSTRUCTIONS:  CHLORHEXIDINE SOAP SHOWER (other brand names are Betasept and Hibiclens ) PLEASE SHOWER WITH CHLORHEXIDINE THE NIGHT BEFORE YOUR SURGERY AND THE AM OF YOUR SURGERY. DO NOT USE CHLORHEXIDINE ON YOUR FACE OR PRIVATE AREAS--YOU MAY USE YOUR NORMAL SOAP THOSE AREAS AND YOUR NORMAL SHAMPOO.  WOMEN SHOULD AVOID SHAVING UNDER ARMS AND SHAVING LEGS 48 HOURS BEFORE USING CHLORHEXIDINE TO AVOID SKIN IRRITATION.  DO NOT USE IF ALLERGIC TO CHLORHEXIDINE.  PLEASE READ OVER ANY  FACT SHEETS THAT YOU WERE GIVEN: MRSA INFORMATION

## 2012-03-22 NOTE — Pre-Procedure Instructions (Signed)
PREOP CBC, DIFF, CMET, LIPASE, UA, SERUM PREGNANCY WAS DONE TODAY AT [facility].  PT HAD EKG REPORT IN EPIC FROM 12/05/11. PREOP INSTRUCTIONS DISCUSSED USING TEACH BACK METHOD.

## 2012-03-22 NOTE — Progress Notes (Signed)
Patient ID: Nichole Wilson, female   DOB: 04/10/1961, 51 y.o.   MRN: [id number]  Chief Complaint  Patient presents with  . Pre-op Exam    lap chole on 03/25/2012    HPI Nichole Wilson is a 51 y.o. female.  She was found to have gallstones and common duct stones. She underwent ERCP X2 last month with retrieval of the stones from the CBD and spinterotomy. She comes today to discuss cholecystectomy. We have her scheduled for next wek but she wanted to review the plans again. She is minimally symptomatic.HPI  Past Medical History  Diagnosis Date  . Hypertriglyceridemia   . Hepatic steatosis   . Breast cyst, left 01-04-12    cyst no longer apparent  . Migraine without aura, without mention of intractable migraine without mention of status migrainosus   . Hypercholesterolemia with hypertriglyceridemia   . Cholelithiasis with choledocholithiasis   . Glucose intolerance (impaired glucose tolerance)     since at least 2011.   . Gallstone pancreatitis   . Hypertension 01-04-12    med d/c-last taken 12-07-11  -PT STATES HER MEDICAL DOCTOR STOPPED HER B/P MEDICATION BECAUSE HER B/P TOO LOW AND PT STATES HER B/P'S HAVE BEEN OK SINCE.  . Complication of anesthesia 2002    PT STATES DURING HER C-SECTION SHE WAS AWARE OF INCISION AND PAIN -THEN WAS QUICKLY "PUT OUT"    Past Surgical History  Procedure Date  . Cesarean section     2002  . Ercp 12/05/2011    Procedure: ENDOSCOPIC RETROGRADE CHOLANGIOPANCREATOGRAPHY (ERCP);  Surgeon: Robert D Kaplan, MD;  Location: [facility];  Service: Endoscopy;  Laterality: N/A;  . Ercp 12/08/2011    Procedure: ENDOSCOPIC RETROGRADE CHOLANGIOPANCREATOGRAPHY (ERCP);  Surgeon: Robert D Kaplan, MD;  Location: [facility] OR;  Service: Gastroenterology;  Laterality: N/A;    Family History  Problem Relation Age of Onset  . Diabetes Mother   . Diabetes Father   . Diabetes Brother     Social History History  Substance Use Topics  . Smoking status: Never Smoker   .  Smokeless tobacco: Never Used  . Alcohol Use: No    No Known Allergies  Current Outpatient Prescriptions  Medication Sig Dispense Refill  . fenofibrate 160 MG tablet Take 160 mg by mouth daily.      . Multiple Vitamin (MULTIVITAMIN WITH MINERALS) TABS Take 1 tablet by mouth daily.      . fish oil-omega-3 fatty acids 1000 MG capsule Take 1 g by mouth daily.      . vitamin C (ASCORBIC ACID) 500 MG tablet Take 500 mg by mouth daily.      . vitamin E 1000 UNIT capsule Take 1,000 Units by mouth daily.        Review of Systems Review of Systems  Constitutional: Negative for fever, chills and unexpected weight change.  HENT: Negative for hearing loss, congestion, sore throat, trouble swallowing and voice change.   Eyes: Negative for visual disturbance.  Respiratory: Negative for cough and wheezing.   Cardiovascular: Negative for chest pain, palpitations and leg swelling.  Gastrointestinal: Positive for constipation, blood in stool, anal bleeding and rectal pain. Negative for nausea, vomiting, abdominal pain, diarrhea and abdominal distention.  Genitourinary: Negative for hematuria, vaginal bleeding and difficulty urinating.  Musculoskeletal: Negative for arthralgias.  Skin: Negative for rash and wound.  Neurological: Negative for seizures, syncope and headaches.  Hematological: Negative for adenopathy. Does not bruise/bleed easily.  Psychiatric/Behavioral: Negative for confusion.    Blood pressure   120/84, pulse 92, temperature 97.6 F (36.4 C), temperature source Temporal, resp. rate 18, height 5' 2" (1.575 m), weight 153 lb (69.4 kg), last menstrual period 11/29/2011.  Physical Exam Physical Exam  Vitals reviewed. Constitutional: She is oriented to person, place, and time. She appears well-developed and well-nourished. No distress.  HENT:  Head: Normocephalic and atraumatic.  Mouth/Throat: Oropharynx is clear and moist.  Eyes: Conjunctivae and EOM are normal. Pupils are equal,  round, and reactive to light. No scleral icterus.  Neck: Normal range of motion. Neck supple. No tracheal deviation present. No thyromegaly present.  Cardiovascular: Normal rate, regular rhythm, normal heart sounds and intact distal pulses.  Exam reveals no gallop and no friction rub.   No murmur heard. Pulmonary/Chest: Effort normal and breath sounds normal. No respiratory distress. She has no wheezes. She has no rales.  Abdominal: Soft. Bowel sounds are normal. She exhibits no distension and no mass. There is no tenderness. There is no rebound and no guarding.  Musculoskeletal: Normal range of motion. She exhibits no edema and no tenderness.  Neurological: She is alert and oriented to person, place, and time.  Skin: Skin is warm and dry. No rash noted. She is not diaphoretic. No erythema.  Psychiatric: She has a normal mood and affect. Her behavior is normal. Judgment and thought content normal.   Vital signs: BP 120/84  Pulse 92  Temp 97.6 F (36.4 C) (Temporal)  Resp 18  Ht 5' 2" (1.575 m)  Wt 153 lb (69.4 kg)  BMI 27.98 kg/m2  LMP 11/29/2011  Data Reviewed I have reviewed the xrays ercp notes and progress notes in Epic Also reviewed pre op labs and new sono Assessment    Cholelithiasis,chronic cholecystitis    Plan    Recommend Lap chole and cholangiogram I have discussed the indications for laparoscopic cholecystectomy with her and provided educational material. We have discussed the risks of surgery, including general risks such as bleeding, infection, lung and heart issues etc. We have also discussed the potential for injuries to other organs, bile duct leaks, and other unexpected events. We have also talked about the fact that this may need to be converted to open under certain circumstances. We discussed the typical post op recovery and the fact that there is a good likelihood of improvement in symptoms and return to normal activity.  She understands this and wishes to  proceed to schedule surgery. I believe all of her questions have been answered.She would like to schedule.         Adasha Boehme J 03/22/2012, 11:40 AM    

## 2012-03-25 ENCOUNTER — Telehealth (INDEPENDENT_AMBULATORY_CARE_PROVIDER_SITE_OTHER): Payer: Self-pay | Admitting: General Surgery

## 2012-03-25 ENCOUNTER — Encounter (HOSPITAL_COMMUNITY): Payer: Self-pay | Admitting: Anesthesiology

## 2012-03-25 ENCOUNTER — Other Ambulatory Visit (INDEPENDENT_AMBULATORY_CARE_PROVIDER_SITE_OTHER): Payer: Self-pay | Admitting: General Surgery

## 2012-03-25 ENCOUNTER — Encounter (HOSPITAL_COMMUNITY): Payer: Self-pay

## 2012-03-25 ENCOUNTER — Ambulatory Visit (HOSPITAL_COMMUNITY)
Admission: RE | Admit: 2012-03-25 | Discharge: 2012-03-25 | Disposition: A | Discharge status: Home / Self Care | Payer: 59 | Source: Ambulatory Visit | Attending: Surgery | Admitting: Surgery

## 2012-03-25 ENCOUNTER — Ambulatory Visit (HOSPITAL_COMMUNITY): Payer: 59 | Admitting: Anesthesiology

## 2012-03-25 ENCOUNTER — Ambulatory Visit (HOSPITAL_COMMUNITY): Payer: 59

## 2012-03-25 ENCOUNTER — Encounter (HOSPITAL_COMMUNITY)
Admission: RE | Disposition: A | Discharge status: Home / Self Care | Payer: Self-pay | Source: Ambulatory Visit | Attending: Surgery

## 2012-03-25 DIAGNOSIS — I10 Essential (primary) hypertension: Secondary | ICD-10-CM | POA: Insufficient documentation

## 2012-03-25 DIAGNOSIS — K8010 Calculus of gallbladder with chronic cholecystitis without obstruction: Secondary | ICD-10-CM

## 2012-03-25 DIAGNOSIS — Z01812 Encounter for preprocedural laboratory examination: Secondary | ICD-10-CM | POA: Insufficient documentation

## 2012-03-25 HISTORY — PX: CHOLECYSTECTOMY: SHX55

## 2012-03-25 SURGERY — LAPAROSCOPIC CHOLECYSTECTOMY WITH INTRAOPERATIVE CHOLANGIOGRAM
Anesthesia: General | Wound class: Clean Contaminated

## 2012-03-25 MED ORDER — CEFAZOLIN SODIUM-DEXTROSE 2-3 GM-% IV SOLR
2.0000 g | INTRAVENOUS | Status: AC
  Administered 2012-03-25: 2 g via INTRAVENOUS

## 2012-03-25 MED ORDER — MEPERIDINE HCL 50 MG/ML IJ SOLN: 6.2500 mg | INTRAMUSCULAR | Status: DC | PRN

## 2012-03-25 MED ORDER — SODIUM CHLORIDE 0.9 % IJ SOLN: 3.0000 mL | Freq: Two times a day (BID) | INTRAMUSCULAR | Status: DC

## 2012-03-25 MED ORDER — ONDANSETRON HCL 4 MG/2ML IJ SOLN: 4.0000 mg | Freq: Four times a day (QID) | INTRAMUSCULAR | Status: DC | PRN

## 2012-03-25 MED ORDER — GLYCOPYRROLATE 0.2 MG/ML IJ SOLN
INTRAMUSCULAR | Status: DC | PRN
  Administered 2012-03-25: 0.4 mg via INTRAVENOUS

## 2012-03-25 MED ORDER — ROCURONIUM BROMIDE 100 MG/10ML IV SOLN
INTRAVENOUS | Status: DC | PRN
  Administered 2012-03-25: 40 mg via INTRAVENOUS

## 2012-03-25 MED ORDER — DIATRIZOATE MEGLUMINE 30 % UR SOLN
URETHRAL | Status: DC | PRN
  Administered 2012-03-25: 300 mL

## 2012-03-25 MED ORDER — OXYCODONE-ACETAMINOPHEN 5-325 MG PO TABS: 1.0000 | ORAL_TABLET | ORAL | Status: AC | PRN

## 2012-03-25 MED ORDER — ONDANSETRON HCL 4 MG/2ML IJ SOLN
INTRAMUSCULAR | Status: DC | PRN
  Administered 2012-03-25: 4 mg via INTRAVENOUS

## 2012-03-25 MED ORDER — ACETAMINOPHEN 10 MG/ML IV SOLN
INTRAVENOUS | Status: DC | PRN
  Administered 2012-03-25: 1000 mg via INTRAVENOUS

## 2012-03-25 MED ORDER — DEXAMETHASONE SODIUM PHOSPHATE 10 MG/ML IJ SOLN
INTRAMUSCULAR | Status: DC | PRN
  Administered 2012-03-25: 10 mg via INTRAVENOUS

## 2012-03-25 MED ORDER — HYDROMORPHONE HCL PF 1 MG/ML IJ SOLN
0.2500 mg | INTRAMUSCULAR | Status: DC | PRN
  Administered 2012-03-25 (×2): 0.5 mg via INTRAVENOUS

## 2012-03-25 MED ORDER — PROMETHAZINE HCL 25 MG/ML IJ SOLN: 6.2500 mg | INTRAMUSCULAR | Status: DC | PRN

## 2012-03-25 MED ORDER — MIDAZOLAM HCL 5 MG/5ML IJ SOLN
INTRAMUSCULAR | Status: DC | PRN
  Administered 2012-03-25: 2 mg via INTRAVENOUS

## 2012-03-25 MED ORDER — PROPOFOL 10 MG/ML IV BOLUS
INTRAVENOUS | Status: DC | PRN
  Administered 2012-03-25: 40 mg via INTRAVENOUS
  Administered 2012-03-25: 160 mg via INTRAVENOUS

## 2012-03-25 MED ORDER — LACTATED RINGERS IV SOLN
INTRAVENOUS | Status: DC | PRN
  Administered 2012-03-25 (×2): via INTRAVENOUS

## 2012-03-25 MED ORDER — LIDOCAINE HCL (CARDIAC) 20 MG/ML IV SOLN
INTRAVENOUS | Status: DC | PRN
  Administered 2012-03-25: 100 mg via INTRAVENOUS

## 2012-03-25 MED ORDER — NEOSTIGMINE METHYLSULFATE 1 MG/ML IJ SOLN
INTRAMUSCULAR | Status: DC | PRN
  Administered 2012-03-25: 3 mg via INTRAVENOUS

## 2012-03-25 MED ORDER — FENTANYL CITRATE 0.05 MG/ML IJ SOLN
INTRAMUSCULAR | Status: DC | PRN
  Administered 2012-03-25 (×5): 50 ug via INTRAVENOUS

## 2012-03-25 MED ORDER — OXYCODONE HCL 5 MG PO TABS: 5.0000 mg | ORAL_TABLET | Freq: Once | ORAL | Status: DC | PRN

## 2012-03-25 MED ORDER — SODIUM CHLORIDE 0.9 % IJ SOLN: 3.0000 mL | INTRAMUSCULAR | Status: DC | PRN

## 2012-03-25 MED ORDER — BUPIVACAINE HCL (PF) 0.25 % IJ SOLN
INTRAMUSCULAR | Status: DC | PRN
  Administered 2012-03-25: 30 mL

## 2012-03-25 MED ORDER — OXYCODONE HCL 5 MG/5ML PO SOLN
5.0000 mg | Freq: Once | ORAL | Status: DC | PRN
  Filled 2012-03-25: qty 5

## 2012-03-25 MED ORDER — ACETAMINOPHEN 10 MG/ML IV SOLN: 1000.0000 mg | Freq: Once | INTRAVENOUS | Status: DC | PRN

## 2012-03-25 MED ORDER — SODIUM CHLORIDE 0.9 % IV SOLN: 250.0000 mL | INTRAVENOUS | Status: DC | PRN

## 2012-03-25 MED ORDER — ONDANSETRON 4 MG PO TBDP: 4.0000 mg | ORAL_TABLET | Freq: Three times a day (TID) | ORAL | Status: AC | PRN

## 2012-03-25 MED ORDER — MORPHINE SULFATE 10 MG/ML IJ SOLN: 2.0000 mg | INTRAMUSCULAR | Status: DC | PRN

## 2012-03-25 MED ORDER — OXYCODONE HCL 5 MG PO TABS: 5.0000 mg | ORAL_TABLET | ORAL | Status: DC | PRN

## 2012-03-25 MED ORDER — LACTATED RINGERS IV SOLN
INTRAVENOUS | Status: DC | PRN
  Administered 2012-03-25: 1000 mL

## 2012-03-25 SURGICAL SUPPLY — 42 items
ADH SKN CLS APL DERMABOND .7 (GAUZE/BANDAGES/DRESSINGS) ×1
APPLIER CLIP ROT 10 11.4 M/L (STAPLE) ×2
APR CLP MED LRG 11.4X10 (STAPLE) ×1
BAG SPEC RTRVL LRG 6X4 10 (ENDOMECHANICALS) ×1
CANISTER SUCTION 2500CC (MISCELLANEOUS) ×2 IMPLANT
CHLORAPREP W/TINT 10.5 ML (MISCELLANEOUS) ×2 IMPLANT
CLIP APPLIE ROT 10 11.4 M/L (STAPLE) ×1 IMPLANT
CLOTH BEACON ORANGE TIMEOUT ST (SAFETY) ×2 IMPLANT
COVER MAYO STAND STRL (DRAPES) ×1 IMPLANT
DECANTER SPIKE VIAL GLASS SM (MISCELLANEOUS) ×2 IMPLANT
DERMABOND ADVANCED (GAUZE/BANDAGES/DRESSINGS) ×1
DERMABOND ADVANCED .7 DNX12 (GAUZE/BANDAGES/DRESSINGS) IMPLANT
DRAPE C-ARM 42X72 X-RAY (DRAPES) ×1 IMPLANT
DRAPE LAPAROSCOPIC ABDOMINAL (DRAPES) ×2 IMPLANT
ELECT REM PT RETURN 9FT ADLT (ELECTROSURGICAL) ×2
ELECTRODE REM PT RTRN 9FT ADLT (ELECTROSURGICAL) ×1 IMPLANT
FILTER SMOKE EVAC LAPAROSHD (FILTER) ×2 IMPLANT
GAUZE SPONGE 2X2 8PLY STRL LF (GAUZE/BANDAGES/DRESSINGS) IMPLANT
GLOVE BIOGEL PI IND STRL 7.0 (GLOVE) ×1 IMPLANT
GLOVE BIOGEL PI INDICATOR 7.0 (GLOVE) ×1
GLOVE EUDERMIC 7 POWDERFREE (GLOVE) ×2 IMPLANT
GOWN STRL NON-REIN LRG LVL3 (GOWN DISPOSABLE) ×2 IMPLANT
GOWN STRL REIN XL XLG (GOWN DISPOSABLE) ×4 IMPLANT
HEMOSTAT SURGICEL 4X8 (HEMOSTASIS) IMPLANT
IV SET EXT 30 76VOL 4 MALE LL (IV SETS) IMPLANT
KIT BASIN OR (CUSTOM PROCEDURE TRAY) ×2 IMPLANT
NS IRRIG 1000ML POUR BTL (IV SOLUTION) ×2 IMPLANT
POUCH SPECIMEN RETRIEVAL 10MM (ENDOMECHANICALS) ×1 IMPLANT
SET CHOLANGIOGRAPH MIX (MISCELLANEOUS) ×2 IMPLANT
SET IRRIG TUBING LAPAROSCOPIC (IRRIGATION / IRRIGATOR) ×2 IMPLANT
SLEEVE Z-THREAD 5X100MM (TROCAR) ×2 IMPLANT
SOLUTION ANTI FOG 6CC (MISCELLANEOUS) ×2 IMPLANT
SPONGE GAUZE 2X2 STER 10/PKG (GAUZE/BANDAGES/DRESSINGS) ×1
STOPCOCK K 69 2C6206 (IV SETS) IMPLANT
STRIP CLOSURE SKIN 1/4X3 (GAUZE/BANDAGES/DRESSINGS) ×4 IMPLANT
SUT MNCRL AB 4-0 PS2 18 (SUTURE) ×2 IMPLANT
TOWEL OR 17X26 10 PK STRL BLUE (TOWEL DISPOSABLE) ×2 IMPLANT
TRAY LAP CHOLE (CUSTOM PROCEDURE TRAY) ×2 IMPLANT
TROCAR XCEL BLUNT TIP 100MML (ENDOMECHANICALS) ×2 IMPLANT
TROCAR Z-THREAD FIOS 11X100 BL (TROCAR) ×2 IMPLANT
TROCAR Z-THREAD FIOS 5X100MM (TROCAR) ×2 IMPLANT
TUBING INSUFFLATION 10FT LAP (TUBING) ×2 IMPLANT

## 2012-03-25 NOTE — Transfer of Care (Signed)
Immediate Anesthesia Transfer of Care Note  Patient: Nichole Wilson  Procedure(s) Performed: Procedure(s) (LRB) with comments: LAPAROSCOPIC CHOLECYSTECTOMY WITH INTRAOPERATIVE CHOLANGIOGRAM (N/A) - Laparoscopic Cholecystectomy with Intraoperative Cholangiogram  Patient Location: PACU  Anesthesia Type: General  Level of Consciousness: awake, alert , oriented and patient cooperative  Airway & Oxygen Therapy: Patient Spontanous Breathing and Patient connected to face mask oxygen  Post-op Assessment: Report given to PACU RN, Post -op Vital signs reviewed and stable and Patient moving all extremities X 4  Post vital signs: Reviewed and stable  Complications: No apparent anesthesia complications

## 2012-03-25 NOTE — Telephone Encounter (Signed)
Pt calling to report vomiting x4 since home from lap chole today.  Paged and updated Dr. Jamey Ripa; ordered Phenergan 25 mg suppositories, # 6, 1 per rectum Q6H prn n/v, no refill.  Called med to [facility]:  [phone].  LMOM to pick up meds.

## 2012-03-25 NOTE — Anesthesia Postprocedure Evaluation (Signed)
Anesthesia Post Note  Patient: Nichole Wilson  Procedure(s) Performed: Procedure(s) (LRB): LAPAROSCOPIC CHOLECYSTECTOMY WITH INTRAOPERATIVE CHOLANGIOGRAM (N/A)  Anesthesia type: General  Patient location: PACU  Post pain: Pain level controlled  Post assessment: Post-op Vital signs reviewed  Last Vitals: BP 139/94  Pulse 95  Temp 36.7 C (Oral)  Resp 15  SpO2 98%  Post vital signs: Reviewed  Level of consciousness: sedated  Complications: No apparent anesthesia complications

## 2012-03-25 NOTE — Anesthesia Procedure Notes (Signed)
Procedure Name: Intubation Date/Time: 03/25/2012 8:02 AM Performed by: Thornell Mule Pre-anesthesia Checklist: Patient identified, Emergency Drugs available, Suction available, Patient being monitored and Timeout performed Patient Re-evaluated:Patient Re-evaluated prior to inductionOxygen Delivery Method: Circle system utilized Preoxygenation: Pre-oxygenation with 100% oxygen Intubation Type: IV induction Ventilation: Mask ventilation without difficulty Laryngoscope Size: Miller and 3 Grade View: Grade I Tube type: Oral Tube size: 7.0 mm Number of attempts: 1 Airway Equipment and Method: Stylet Placement Confirmation: ETT inserted through vocal cords under direct vision,  positive ETCO2 and breath sounds checked- equal and bilateral Secured at: 22 cm Tube secured with: Tape Dental Injury: Teeth and Oropharynx as per pre-operative assessment

## 2012-03-25 NOTE — Progress Notes (Signed)
Pt sitting in wheelchair to be discharged and had an episode of emesis.  Emesis appears to be bile and small amount noted.  Pt states she feels much better and states she is ready to go home.  Pt taken out via wheelchair to private vehicle.

## 2012-03-25 NOTE — Preoperative (Signed)
Beta Blockers   Reason not to administer Beta Blockers:Not Applicable 

## 2012-03-25 NOTE — Op Note (Signed)
Nichole Wilson 12/05/1960 [id number] 02/26/2012  Preoperative diagnosis: chronic calculus cholecystitis, status post ERCP for choledocholithiasis  Postoperative diagnosis: same  Procedure: laparoscopic cholecystectomy with intraoperative cholangiogram  Surgeon: Currie Paris, MD, FACS  Assistant surgeon: Dr. Gaynelle Adu   Anesthesia: General  Clinical History and Indications: This patient has known gallstones and comes in today for cholecystectomy.she presented several weeks ago and was found to have cholelithiasis and choledocholithiasis. She underwent ERCP x2 with clearing of her common duct. She continues to have stones in her gallbladder and elected to proceed to surgery today.  Description of procedure: The patient was seen in the preoperative area. I reviewed the plans for the procedure with her as well as the risks and complications. She had no further questions.  The patient was taken to the operating room. After satisfactory general endotracheal anesthesia had been obtained the abdomen was prepped and draped. A time out was done.  0.25% plain Marcaine was used at all incisions. I made an umbilical incision, identified the fascia and opened that, and entered the peritoneal cavity under direct vision. A 0 Vicryl pursestring suture was placed and the Hasson cannula was introduced under direct vision and secured with the pursestring. The abdomen was inflated to 15 cm.  The camera was placed and there were no gross abnormalities. The patient was then placed in reverse Trendelenburg and tilted to the left. A 10/11 trocar was placed in the epigastrium and two 5 mm trochars placed laterally all under direct vision.  The gallbladder was retracted over the liver. The peritoneum overlying the cystic duct was opened. A long window was made with a long segment of cystic duct identified. I could see the clearly the common duct cystic duct junction and the gallbladder cystic duct junction.  The cystic duct was dilated. The artery was identified. A clip was placed in the cystic duct at the junction with the gallbladder and a clip on the cystic artery.  An intraoperative cholangiogram was then performed. A Cook catheter was introduced percutaneously and placed in the cystic duct. The cholangiogram showed good filling of the common duct and hepatic radicals and free flow into the duodenum. No abnormalities were noted.the left hepatic duct did not fill fully.  The catheter was removed and 4 clips placed on the stay side of the cystic duct. The duct was then divided.  Additional clips are placed on the cystic artery and it was divided. The gallbladder was then removed from below to above the coagulation current of the cautery. It was then placed in a bag to be retrieved later.  The abdomen was irrigated and a check for hemostasis along the bed of the gallbladder made. Once everything appeared to be dry we were able to move the camera to the epigastric port and removed the gallbladder through the umbilical port.  The abdomen was reinsufflated and a final check for hemostasis made. There is no evidence of bleeding or bile leakage. The lateral ports were removed under direct vision and there was no bleeding. The umbilical site was closed with a pursestring, watching with the camera in the epigastric port. The abdomen was then deflated through the epigastric port and that was removed. Skin was closed with 4-0 Monocryl subcuticular and Dermabond.  The patient tolerated the procedure well. There were no operative complications. EBL was minimal. All counts were correct.  Currie Paris, MD, FACS 03/25/2012 8:35 AM

## 2012-03-25 NOTE — Interval H&P Note (Signed)
History and Physical Interval Note:  03/25/2012 7:12 AM  Nichole Wilson  has presented today for surgery, with the diagnosis of gallstone  The various methods of treatment have been discussed with the patient and family. After consideration of risks, benefits and other options for treatment, the patient has consented to  Procedure(s) (LRB) with comments: LAPAROSCOPIC CHOLECYSTECTOMY WITH INTRAOPERATIVE CHOLANGIOGRAM (N/A) - Laparoscopic Cholecystectomy with Intraoperative Cholangiogram as a surgical intervention .  The patient's history has been reviewed, patient examined, no change in status, stable for surgery.  I have reviewed the patient's chart and labs.  Questions were answered to the patient's satisfaction.     Terance Pomplun J

## 2012-03-25 NOTE — H&P (View-Only) (Signed)
Patient ID: Nichole Wilson, female   DOB: 01-18-1961, 51 y.o.   MRN: [id number]  Chief Complaint  Patient presents with  . Pre-op Exam    lap chole on 03/25/2012    HPI Nichole Wilson is a 51 y.o. female.  She was found to have gallstones and common duct stones. She underwent ERCP X2 last month with retrieval of the stones from the CBD and spinterotomy. She comes today to discuss cholecystectomy. We have her scheduled for next wek but she wanted to review the plans again. She is minimally symptomatic.HPI  Past Medical History  Diagnosis Date  . Hypertriglyceridemia   . Hepatic steatosis   . Breast cyst, left 01-04-12    cyst no longer apparent  . Migraine without aura, without mention of intractable migraine without mention of status migrainosus   . Hypercholesterolemia with hypertriglyceridemia   . Cholelithiasis with choledocholithiasis   . Glucose intolerance (impaired glucose tolerance)     since at least 2011.   . Gallstone pancreatitis   . Hypertension 01-04-12    med d/c-last taken 12-07-11  -PT STATES HER MEDICAL DOCTOR STOPPED HER B/P MEDICATION BECAUSE HER B/P TOO LOW AND PT STATES HER B/P'S HAVE BEEN OK SINCE.  [name] Complication of anesthesia 2002    PT STATES DURING HER C-SECTION SHE WAS AWARE OF INCISION AND PAIN -THEN WAS QUICKLY "PUT OUT"    Past Surgical History  Procedure Date  . Cesarean section     2002  . Ercp 12/05/2011    Procedure: ENDOSCOPIC RETROGRADE CHOLANGIOPANCREATOGRAPHY (ERCP);  Surgeon: Louis Meckel, MD;  Location: [facility];  Service: Endoscopy;  Laterality: N/A;  . Ercp 12/08/2011    Procedure: ENDOSCOPIC RETROGRADE CHOLANGIOPANCREATOGRAPHY (ERCP);  Surgeon: Louis Meckel, MD;  Location: [facility] OR;  Service: Gastroenterology;  Laterality: N/A;    Family History  Problem Relation Age of Onset  . Diabetes Mother   . Diabetes Father   . Diabetes Brother     Social History History  Substance Use Topics  . Smoking status: Never Smoker   .  Smokeless tobacco: Never Used  . Alcohol Use: No    No Known Allergies  Current Outpatient Prescriptions  Medication Sig Dispense Refill  . fenofibrate 160 MG tablet Take 160 mg by mouth daily.      . Multiple Vitamin (MULTIVITAMIN WITH MINERALS) TABS Take 1 tablet by mouth daily.      . fish oil-omega-3 fatty acids 1000 MG capsule Take 1 g by mouth daily.      . vitamin C (ASCORBIC ACID) 500 MG tablet Take 500 mg by mouth daily.      . vitamin E 1000 UNIT capsule Take 1,000 Units by mouth daily.        Review of Systems Review of Systems  Constitutional: Negative for fever, chills and unexpected weight change.  HENT: Negative for hearing loss, congestion, sore throat, trouble swallowing and voice change.   Eyes: Negative for visual disturbance.  Respiratory: Negative for cough and wheezing.   Cardiovascular: Negative for chest pain, palpitations and leg swelling.  Gastrointestinal: Positive for constipation, blood in stool, anal bleeding and rectal pain. Negative for nausea, vomiting, abdominal pain, diarrhea and abdominal distention.  Genitourinary: Negative for hematuria, vaginal bleeding and difficulty urinating.  Musculoskeletal: Negative for arthralgias.  Skin: Negative for rash and wound.  Neurological: Negative for seizures, syncope and headaches.  Hematological: Negative for adenopathy. Does not bruise/bleed easily.  Psychiatric/Behavioral: Negative for confusion.    Blood pressure  120/84, pulse 92, temperature 97.6 F (36.4 C), temperature source Temporal, resp. rate 18, height 5\' 2"  (1.575 m), weight 153 lb (69.4 kg), last menstrual period 11/29/2011.  Physical Exam Physical Exam  Vitals reviewed. Constitutional: She is oriented to person, place, and time. She appears well-developed and well-nourished. No distress.  HENT:  Head: Normocephalic and atraumatic.  Mouth/Throat: Oropharynx is clear and moist.  Eyes: Conjunctivae and EOM are normal. Pupils are equal,  round, and reactive to light. No scleral icterus.  Neck: Normal range of motion. Neck supple. No tracheal deviation present. No thyromegaly present.  Cardiovascular: Normal rate, regular rhythm, normal heart sounds and intact distal pulses.  Exam reveals no gallop and no friction rub.   No murmur heard. Pulmonary/Chest: Effort normal and breath sounds normal. No respiratory distress. She has no wheezes. She has no rales.  Abdominal: Soft. Bowel sounds are normal. She exhibits no distension and no mass. There is no tenderness. There is no rebound and no guarding.  Musculoskeletal: Normal range of motion. She exhibits no edema and no tenderness.  Neurological: She is alert and oriented to person, place, and time.  Skin: Skin is warm and dry. No rash noted. She is not diaphoretic. No erythema.  Psychiatric: She has a normal mood and affect. Her behavior is normal. Judgment and thought content normal.   Vital signs: BP 120/84  Pulse 92  Temp 97.6 F (36.4 C) (Temporal)  Resp 18  Ht 5\' 2"  (1.575 m)  Wt 153 lb (69.4 kg)  BMI 27.98 kg/m2  LMP 11/29/2011  Data Reviewed I have reviewed the xrays ercp notes and progress notes in Epic Also reviewed pre op labs and new sono Assessment    Cholelithiasis,chronic cholecystitis    Plan    Recommend Lap chole and cholangiogram I have discussed the indications for laparoscopic cholecystectomy with her and provided educational material. We have discussed the risks of surgery, including general risks such as bleeding, infection, lung and heart issues etc. We have also discussed the potential for injuries to other organs, bile duct leaks, and other unexpected events. We have also talked about the fact that this may need to be converted to open under certain circumstances. We discussed the typical post op recovery and the fact that there is a good likelihood of improvement in symptoms and return to normal activity.  She understands this and wishes to  proceed to schedule surgery. I believe all of her questions have been answered.She would like to schedule.         Hadlea Furuya J 03/22/2012, 11:40 AM

## 2012-03-25 NOTE — Anesthesia Preprocedure Evaluation (Addendum)
Anesthesia Evaluation  Patient identified by MRN, date of birth, ID band Patient awake    Reviewed: Allergy & Precautions, H&P , NPO status , Patient's Chart, lab work & pertinent test results  History of Anesthesia Complications Negative for: history of anesthetic complications  Airway Mallampati: I TM Distance: >3 FB Neck ROM: Full    Dental  (+) Teeth Intact and Dental Advisory Given   Pulmonary neg pulmonary ROS,  breath sounds clear to auscultation  Pulmonary exam normal       Cardiovascular Exercise Tolerance: Good hypertension, - CAD, - Past MI, - CHF and - DVT Rhythm:Regular Rate:Normal     Neuro/Psych  Headaches, negative psych ROS   GI/Hepatic negative GI ROS, Fatty liver   Endo/Other  negative endocrine ROS  Renal/GU negative Renal ROS     Musculoskeletal negative musculoskeletal ROS (+)   Abdominal   Peds  Hematology negative hematology ROS (+)   Anesthesia Other Findings   Reproductive/Obstetrics                          Anesthesia Physical Anesthesia Plan  ASA: II  Anesthesia Plan: General   Post-op Pain Management:    Induction: Intravenous  Airway Management Planned: Oral ETT  Additional Equipment:   Intra-op Plan:   Post-operative Plan: Extubation in OR  Informed Consent: I have reviewed the patients History and Physical, chart, labs and discussed the procedure including the risks, benefits and alternatives for the proposed anesthesia with the patient or authorized representative who has indicated his/her understanding and acceptance.   Dental advisory given  Plan Discussed with: CRNA and Surgeon  Anesthesia Plan Comments:        Anesthesia Quick Evaluation

## 2012-03-26 ENCOUNTER — Encounter (HOSPITAL_COMMUNITY): Payer: Self-pay | Admitting: Emergency Medicine

## 2012-03-26 ENCOUNTER — Emergency Department (HOSPITAL_COMMUNITY)
Admission: EM | Admit: 2012-03-26 | Discharge: 2012-03-26 | Disposition: A | Discharge status: Home / Self Care | Payer: 59 | Attending: Emergency Medicine | Admitting: Emergency Medicine

## 2012-03-26 DIAGNOSIS — E7800 Pure hypercholesterolemia, unspecified: Secondary | ICD-10-CM | POA: Insufficient documentation

## 2012-03-26 DIAGNOSIS — IMO0002 Reserved for concepts with insufficient information to code with codable children: Secondary | ICD-10-CM | POA: Insufficient documentation

## 2012-03-26 DIAGNOSIS — R112 Nausea with vomiting, unspecified: Secondary | ICD-10-CM | POA: Insufficient documentation

## 2012-03-26 DIAGNOSIS — Y836 Removal of other organ (partial) (total) as the cause of abnormal reaction of the patient, or of later complication, without mention of misadventure at the time of the procedure: Secondary | ICD-10-CM | POA: Insufficient documentation

## 2012-03-26 DIAGNOSIS — I10 Essential (primary) hypertension: Secondary | ICD-10-CM | POA: Insufficient documentation

## 2012-03-26 MED ORDER — ACETAMINOPHEN 325 MG PO TABS
ORAL_TABLET | ORAL | Status: AC
  Filled 2012-03-26: qty 2

## 2012-03-26 MED ORDER — ACETAMINOPHEN 325 MG PO TABS
650.0000 mg | ORAL_TABLET | Freq: Once | ORAL | Status: AC
  Administered 2012-03-26: 650 mg via ORAL

## 2012-03-26 MED ORDER — ONDANSETRON 8 MG PO TBDP
8.0000 mg | ORAL_TABLET | Freq: Once | ORAL | Status: AC
  Administered 2012-03-26: 8 mg via ORAL
  Filled 2012-03-26: qty 1

## 2012-03-26 MED ORDER — MORPHINE SULFATE 4 MG/ML IJ SOLN
4.0000 mg | Freq: Once | INTRAMUSCULAR | Status: AC
  Administered 2012-03-26: 4 mg via INTRAVENOUS
  Filled 2012-03-26: qty 1

## 2012-03-26 MED ORDER — SODIUM CHLORIDE 0.9 % IV BOLUS (SEPSIS)
1000.0000 mL | Freq: Once | INTRAVENOUS | Status: AC
  Administered 2012-03-26: 1000 mL via INTRAVENOUS

## 2012-03-26 MED ORDER — PROMETHAZINE HCL 25 MG/ML IJ SOLN
12.5000 mg | Freq: Once | INTRAMUSCULAR | Status: AC
  Administered 2012-03-26: 12.5 mg via INTRAVENOUS
  Filled 2012-03-26: qty 1

## 2012-03-26 MED ORDER — PROMETHAZINE HCL 25 MG PO TABS: 25.0000 mg | ORAL_TABLET | Freq: Four times a day (QID) | ORAL | Status: DC | PRN

## 2012-03-26 NOTE — ED Provider Notes (Signed)
History     CSN: [id number]  Arrival date & time 03/26/12  7829   First MD Initiated Contact with Patient 03/26/12 [phone]      Chief Complaint  Patient presents with  . Post-op Problem    (Consider location/radiation/quality/duration/timing/severity/associated sxs/prior treatment) HPI Comments: Nichole Wilson is a 51 y.o. Female presents to ER with complaint of post of bleeding from the incision site. Pt states she had a cholecystectomy yesterday by Dr. Jamey Ripa. States she has had a lot of nausea and vomiting last night. She was called in some nausea medication which she took, but continued to vomit. States last vomited around 11pm. States in the middle of the night she noted that her dressing over the umbilical incision was wet and saturated with blood. Pt denies any pain. No fever. Nausea has improved but she has not tried to drink anything.    Past Medical History  Diagnosis Date  . Hypertriglyceridemia   . Hepatic steatosis   . Breast cyst, left 01-04-12    cyst no longer apparent  . Migraine without aura, without mention of intractable migraine without mention of status migrainosus   . Hypercholesterolemia with hypertriglyceridemia   . Cholelithiasis with choledocholithiasis   . Glucose intolerance (impaired glucose tolerance)     since at least 2011.   . Gallstone pancreatitis   . Hypertension 01-04-12    med d/c-last taken 12-07-11  -PT STATES HER MEDICAL DOCTOR STOPPED HER B/P MEDICATION BECAUSE HER B/P TOO LOW AND PT STATES HER B/P'S HAVE BEEN OK SINCE.  [name] Complication of anesthesia 2002    PT STATES DURING HER C-SECTION SHE WAS AWARE OF INCISION AND PAIN -THEN WAS QUICKLY "PUT OUT"    Past Surgical History  Procedure Date  . Cesarean section     2002  . Ercp 12/05/2011    Procedure: ENDOSCOPIC RETROGRADE CHOLANGIOPANCREATOGRAPHY (ERCP);  Surgeon: Louis Meckel, MD;  Location: [facility];  Service: Endoscopy;  Laterality: N/A;  . Ercp 12/08/2011    Procedure:  ENDOSCOPIC RETROGRADE CHOLANGIOPANCREATOGRAPHY (ERCP);  Surgeon: Louis Meckel, MD;  Location: [facility] OR;  Service: Gastroenterology;  Laterality: N/A;    Family History  Problem Relation Age of Onset  . Diabetes Mother   . Diabetes Father   . Diabetes Brother     History  Substance Use Topics  . Smoking status: Never Smoker   . Smokeless tobacco: Never Used  . Alcohol Use: No    OB History    Grav Para Term Preterm Abortions TAB SAB Ect Mult Living                  Review of Systems  Respiratory: Negative.   Gastrointestinal: Positive for nausea and vomiting. Negative for abdominal pain.  Skin: Positive for wound.  Hematological: Does not bruise/bleed easily.  All other systems reviewed and are negative.    Allergies  Review of patient's allergies indicates no known allergies.  Home Medications   Current Outpatient Rx  Name Route Sig Dispense Refill  . FENOFIBRATE 160 MG PO TABS Oral Take 160 mg by mouth daily.    . OMEGA-3 FATTY ACIDS 1000 MG PO CAPS Oral Take 1 g by mouth daily.    . ADULT MULTIVITAMIN W/MINERALS CH Oral Take 1 tablet by mouth daily.    [name] ONDANSETRON 4 MG PO TBDP Oral Take 1 tablet (4 mg total) by mouth every 8 (eight) hours as needed for nausea. 10 tablet 0  . OXYCODONE-ACETAMINOPHEN 5-325 MG PO TABS  Oral Take 1 tablet by mouth every 4 (four) hours as needed for pain. 30 tablet 0  . VITAMIN C 500 MG PO TABS Oral Take 500 mg by mouth daily.    [name] VITAMIN E 1000 UNITS PO CAPS Oral Take 1,000 Units by mouth daily.      BP 112/81  Pulse 103  Temp 99 F (37.2 C) (Oral)  Resp 20  SpO2 96%  LMP 11/29/2011  Physical Exam  Nursing note and vitals reviewed. Constitutional: She is oriented to person, place, and time. She appears well-developed and well-nourished. No distress.  HENT:  Head: Normocephalic.  Neck: Neck supple.  Cardiovascular: Normal rate, regular rhythm and normal heart sounds.   Pulmonary/Chest: Effort normal and breath sounds  normal. No respiratory distress. She has no wheezes. She has no rales.  Abdominal: Soft. Bowel sounds are normal. She exhibits no distension. There is no rebound and no guarding.       Diffuse mild abdominal tenderness. Dressing over umbilical dressing saturated with blood. Dressing removed, large blood clot removed. Mild oozing of blood from open but not gaping umbilical incision.   Musculoskeletal: She exhibits no edema.  Neurological: She is alert and oriented to person, place, and time.  Skin: Skin is warm and dry.  Psychiatric: She has a normal mood and affect.    ED Course  Procedures (including critical care time)  Pt with post op bleeding from one of the incisions. Bleeding is minimal at present. Dressing changed. Spoke with surgery, will come see.   9:23 AM Pt seen by general surgery. Plan: tolerate POs, home with pressure dressings, pain and nausea meds, follow up with general surgery.    As pt was being discharged, she vomited a large amount of clear liquid. I started an IV, fluids, phenergan.   3:27 PM Pt hydrated, she is feeling better again. Tolerating ginger ale. There is no bleeding through the dressing. Wants to go home. Will d/c home.    1. Post-op bleeding   2. Post-operative nausea and vomiting       MDM          Lottie Mussel, PA 03/26/12 1528

## 2012-03-26 NOTE — ED Notes (Signed)
Upon discharge, pt vomited, PA notified, plan is to establish IV, IVF,and nausea medication

## 2012-03-26 NOTE — ED Provider Notes (Signed)
Medical screening examination/treatment/procedure(s) were performed by non-physician practitioner and as supervising physician I was immediately available for consultation/collaboration.    Nelia Shi, MD 03/26/12 [phone]

## 2012-03-26 NOTE — Progress Notes (Signed)
Patient ID: Nichole Wilson, female   DOB: [birth year], 51 y.o.   MRN: [id number]    Subjective: Pt feeling much better.  She presented to [facility] this morning after having several episodes of nausea and vomiting last night after having a lap chole yesterday by Dr. Jamey Ripa.  She was found to have a bleeding at her umbilical site after multiple episodes of emesis.  Upon arrival to the ED, her bleeding had stopped.  She had a large clot which the ED PA removed and then placed a pressure dressing over the wound.  By the time I came to see her, she had no further bleeding.  She was no longer nauseated and wanting a popsicle.  Objective: Vital signs in last 24 hours: Temp:  [97.1 F (36.2 C)-99 F (37.2 C)] 98.4 F (36.9 C) (09/10 0808) Pulse Rate:  [97-103] 97  (09/10 0808) Resp:  [15-20] 16  (09/10 0808) BP: (112-140)/(76-90) 118/76 mmHg (09/10 0808) SpO2:  [96 %-97 %] 97 % (09/10 0808)    Intake/Output from previous day:   Intake/Output this shift:    PE: Abd: soft, umbilical incision with a small blood clot.  Appeared to have a small skin edge bleeder.  No further bleeding at this time.  All other incisions are c/d/i.  Appropriately tender, +BS  Lab Results:  No results found for this basename: WBC:2,HGB:2,HCT:2,PLT:2 in the last 72 hours BMET No results found for this basename: NA:2,K:2,CL:2,CO2:2,GLUCOSE:2,BUN:2,CREATININE:2,CALCIUM:2 in the last 72 hours PT/INR No results found for this basename: LABPROT:2,INR:2 in the last 72 hours CMP     Component Value Date/Time   NA 139 03/22/2012 0925   K 3.7 03/22/2012 0925   CL 102 03/22/2012 0925   CO2 24 03/22/2012 0925   GLUCOSE 195* 03/22/2012 0925   BUN 13 03/22/2012 0925   CREATININE 0.52 03/22/2012 0925   CREATININE 0.52 12/12/2011 0910   CALCIUM 9.3 03/22/2012 0925   PROT 7.3 03/22/2012 0925   ALBUMIN 4.0 03/22/2012 0925   AST 30 03/22/2012 0925   ALT 35 03/22/2012 0925   ALKPHOS 82 03/22/2012 0925   BILITOT 0.3 03/22/2012 0925   GFRNONAA >90 03/22/2012  0925   GFRAA >90 03/22/2012 0925   Lipase     Component Value Date/Time   LIPASE 31 03/22/2012 0925       Studies/Results: Dg Cholangiogram Operative  03/25/2012  *RADIOLOGY REPORT*  Clinical Data: Choledocholithiasis.  INTRAOPERATIVE CHOLANGIOGRAM  Comparison:  Ultrasound 08/26.  Previous ERCP exams with intervention.  Findings: Contrast is injected through the cystic duct remnant. There is good filling of the common bile duct.  No filling defects of the common bile duct.  Contrast flows freely into the duodenum. There is only moderate opacification of the common hepatic duct and proximal intrahepatic ducts.  Extrahepatic ducts are mildly dilated, presumably related to prior disease.  IMPRESSION: No filling defects or obstruction.   Original Report Authenticated By: Thomasenia Sales, M.D.     Anti-infectives: Anti-infectives    None       Assessment/Plan  1. S/p lap chole 2. N/V, improved 3. Bleeding at umbilical site, stopped  Plan: 1. I have replaced her dressing.  She is to leave this on for at least today.  She is informed not to shower for 2 days to allow this clot to heal over.  I have told her she may get up today and have bathroom privileges and go to the kitchen, but to not over do it.  As long as  here is no further bleeding, she may mobilize more tomorrow.  If she does have further bleeding, she is instructed to call our office to possibly be seen in the urgent office as opposed to having to come back to the ED. 2. She will try some clear liquids here.  If she tolerates these, she can go home.  If she is unable she will likely need admission for nausea control.   3. Follow up with Dr. Jamey Ripa in 2-3 weeks as instructed.   LOS: 0 days    Zaccheus Edmister E 03/26/2012

## 2012-03-26 NOTE — Progress Notes (Signed)
Agree with above, if tolerates clears can go home on clears

## 2012-03-26 NOTE — ED Notes (Signed)
Pt states she had her gallbladder removed yesterday morning and went home yesterday afternoon  Pt states she had post op vomiting all day yesterday  Last vomited around 11 pm last night  Pt states this morning when she woke up she felt all wet and noticed she had blood all over her abd dressing   Dr Jamey Ripa performed the surgery

## 2012-03-27 ENCOUNTER — Telehealth (INDEPENDENT_AMBULATORY_CARE_PROVIDER_SITE_OTHER): Payer: Self-pay | Admitting: General Surgery

## 2012-03-27 NOTE — Telephone Encounter (Signed)
Called patient. She is better. She is having no more bleeding. Her husband changed her dressing this morning and wounds look good. She has had no nausea since getting an IV in the hospital and hasn't had to take any nausea medicine they gave her. I made her a follow up appt and instructed her to call prior with any problems.

## 2012-03-27 NOTE — Telephone Encounter (Signed)
Message copied by Liliana Cline on Wed Mar 27, 2012  9:53 AM ------      Message from: Currie Paris      Created: Wed Mar 27, 2012  8:40 AM       Nichole Wilson had lap chole Monday, was in ED yesterday with nausea and bleeding from incision, but improved and went home. Please give her a call today and see how Nichole Wilson is doing.            Thanks

## 2012-04-16 ENCOUNTER — Telehealth: Payer: Self-pay | Admitting: *Deleted

## 2012-04-16 NOTE — Telephone Encounter (Signed)
Message copied by Florene Glen on Tue Apr 16, 2012 10:43 AM ------      Message from: Florene Glen      Created: Tue Mar 19, 2012 10:20 AM       Call to schedule colon and pv post lap chole

## 2012-04-18 ENCOUNTER — Encounter (INDEPENDENT_AMBULATORY_CARE_PROVIDER_SITE_OTHER): Payer: Self-pay | Admitting: Surgery

## 2012-04-18 NOTE — Telephone Encounter (Signed)
Pt reports she will see Dr Jamey Ripa tomorrow and will ask him when she can schedule the COLON; right now she thinks it's too soon.

## 2012-04-19 ENCOUNTER — Encounter (INDEPENDENT_AMBULATORY_CARE_PROVIDER_SITE_OTHER): Payer: Self-pay | Admitting: Surgery

## 2012-04-19 ENCOUNTER — Ambulatory Visit (INDEPENDENT_AMBULATORY_CARE_PROVIDER_SITE_OTHER): Payer: Commercial Managed Care - PPO | Admitting: Surgery

## 2012-04-19 VITALS — BP 126/80 | HR 99 | Temp 99.0°F | Resp 18 | Ht 62.0 in | Wt 149.2 lb

## 2012-04-19 DIAGNOSIS — K8010 Calculus of gallbladder with chronic cholecystitis without obstruction: Secondary | ICD-10-CM

## 2012-04-19 NOTE — Progress Notes (Signed)
NAME: Nichole Wilson       DOB: 1960/11/23           DATE: 04/19/2012       WGN:[id number]   CC: Postop laparoscopic cholecystectomy  Impression:  The patient appears to be doing well, with improvement in her symptoms.  Plan:  She may resume full activity and regular diet. She  will followup with [place] on a p.r.n. basis. I did tell her that she may still have some foods that cause indigestion and ask her to call us if there are any questions, problems or concerns.  HPI:  This patient underwent a laparoscopic cholecystectomy with operative cholangiogram on 03/25/12. She is in for her first postoperative visit. She notes that her incisional pain has resolved. Her preoperative symptoms have improved. She is not having problems with nausea, vomiting, diarrhea, fevers, chills, or urinary symptoms. She is tolerating diet. She feels that she is progressing well and nearly back to normal. PE:  VS: BP 126/80  Pulse 99  Temp 99 F (37.2 C) (Oral)  Resp 18  Ht 5\' 2"  (1.575 m)  Wt 149 lb 3.2 oz (67.677 kg)  BMI 27.29 kg/m2  LMP 11/29/2011  General: The patient is alert and appears comfortable, NAD.  Abdomen: Soft and benign. The incisions are healing nicely. There are no apparent problems.  Data reviewed: IOC:  IMPRESSION:  No filling defects or obstruction.  Original Report Authenticated By: Thomasenia Sales, M.D.  Pathology: Diagnosis Gallbladder CHRONIC CHOLECYSTITIS AND CHOLELITHIASIS. Abigail Miyamoto MD Pathologist, Electronic Signature

## 2012-04-19 NOTE — Patient Instructions (Addendum)
You may resume all normal activities. It's okay to go ahead and schedule your colonoscopy We will see you again on an as needed basis. Please call the office at [phone] if you have any questions or concerns. Thank you for allowing [place] to take care of you.

## 2012-05-17 ENCOUNTER — Encounter: Payer: Self-pay | Admitting: Sports Medicine

## 2012-05-17 ENCOUNTER — Ambulatory Visit (INDEPENDENT_AMBULATORY_CARE_PROVIDER_SITE_OTHER): Payer: 59 | Admitting: Sports Medicine

## 2012-05-17 VITALS — BP 131/83 | HR 89 | Ht 62.0 in | Wt 152.0 lb

## 2012-05-17 DIAGNOSIS — M5430 Sciatica, unspecified side: Secondary | ICD-10-CM

## 2012-05-17 HISTORY — DX: Sciatica, unspecified side: M54.30

## 2012-05-17 MED ORDER — TRAMADOL HCL 50 MG PO TABS: 50.0000 mg | ORAL_TABLET | Freq: Four times a day (QID) | ORAL | Status: DC | PRN

## 2012-05-17 MED ORDER — PREDNISONE 10 MG PO KIT: PACK | ORAL | Status: DC

## 2012-05-17 NOTE — Progress Notes (Signed)
Nichole Wilson is a 51 y.o. female who presents to [facility] today for left back and leg pain.  The last 3-4 weeks patient has noted pain in her low left back that radiates down her left leg to her toes.   She has not tried any pain medication however the pain is moderately bothersome.  She denies any weakness but does note some subjective numbness.  She denies any bowel bladder dysfunction or difficulty walking.  She feels well otherwise.  No fevers chills weight loss of night sweats.   PMH reviewed. No history of hypertension or diabetes History  Substance Use Topics  . Smoking status: Never Smoker   . Smokeless tobacco: Never Used  . Alcohol Use: No   ROS as above otherwise neg   Exam:  BP 131/83  Pulse 89  Ht 5\' 2"  (1.575 m)  Wt 152 lb (68.947 kg)  BMI 27.80 kg/m2 Gen: Well NAD MSK: Spine:  Nontender over spinal midline mildly tender over left gluteus. Normal back motion to flexion extension lateral flexion and rotate. Strength: 5/5 strength to hips flexion abduction and adduction knee extension flexion ankle and great toe plantar flexion and dorsiflexion bilaterally Reflexes are intact and equal in both knees and ankles Patient has a normal gait he can get off and on the exam table by herself.  Sensation is intact distally

## 2012-05-17 NOTE — Assessment & Plan Note (Signed)
Patient with left leg radicular lumbar pain.  No concerning symptoms aside from pain. Plan: Trial of steroid dosepak and tramadol as needed for pain Discussed warning signs or symptoms. Please see discharge instructions. Patient expresses understanding. F/u 2 weeks if not improved.

## 2012-05-17 NOTE — Patient Instructions (Addendum)
Thank you for coming in today. You have sciatica.  Take the prednisone for 12 days.  Use the tramadol as needed.  Come back or go to the emergency room if you notice new weakness new numbness problems walking or bowel or bladder problems.' Come back in 2 weeks if not better.

## 2012-06-06 ENCOUNTER — Encounter: Payer: Self-pay | Admitting: Gastroenterology

## 2012-06-06 NOTE — Telephone Encounter (Signed)
Per Dr. Tenna Child note it is ok for the patient to have a colon.  I have been unable to reach her at home.  I have asked that the [facility] please send a colon recall letter to the patient

## 2012-06-17 ENCOUNTER — Ambulatory Visit (INDEPENDENT_AMBULATORY_CARE_PROVIDER_SITE_OTHER): Payer: 59 | Admitting: Family Medicine

## 2012-06-17 ENCOUNTER — Encounter: Payer: Self-pay | Admitting: Family Medicine

## 2012-06-17 VITALS — BP 126/85 | HR 90 | Temp 98.8°F | Wt 153.0 lb

## 2012-06-17 DIAGNOSIS — L723 Sebaceous cyst: Secondary | ICD-10-CM

## 2012-06-17 DIAGNOSIS — L720 Epidermal cyst: Secondary | ICD-10-CM

## 2012-06-17 NOTE — Progress Notes (Signed)
  Subjective:    Patient ID: Nichole Wilson, female    DOB: [birth year], 51 y.o.   MRN: [id number]  HPI: Pt presents today for a "lump in the middle of her chest" for three years. States it has been there and "sometimes gets bigger, sometimes gets firmer, sometimes is softer." She states that it feels like it has gotten larger recently but that it feels like it has gotten bigger "deeper" instead of bigger superficially, and she finally made an appointment to have it looked at. She states that once or twice, about two years ago, the lump got bigger/firmer and she was able to expel some "whitish material" from it, but in general it does not have any drainage. She denies pain or rash/redness of the lesion and states that "it is just irritating and it worried me that it felt like it was getting deeper."  Review of Systems: Denies fever/chills, N/V, rash, chest pain, SOB.     Objective:   Physical Exam BP 126/85  Pulse 90  Temp 98.8 F (37.1 C) (Oral)  Wt 153 lb (69.4 kg) Gen: adult female, very pleasant in NAD Cardio: RRR, normal S1, S2 Pulm: CTAB, no wheeze Chest wall: roughly 1cm ovoid lesion between breasts at distal end of sternum, not discolored or erythematous, nontender to palpation, firm consistency; does not feel fixed to underlying bone/tissue, with fairly regular margin     Assessment & Plan:

## 2012-06-17 NOTE — Assessment & Plan Note (Signed)
A: Present on anterior chest between breasts, about 1 cm and ovoid. Does not appear infected, not fixed to underlying structures. P: Will have pt return to clinic for cyst removal. Appt scheduled 12/18 at 8:30 AM with Dr. Gwenlyn Saran.

## 2012-06-17 NOTE — Patient Instructions (Signed)
Thank you for coming in, today! It was nice to meet you. I think the lump on your chest is an epidermal cyst. It can easily be removed here in clinic. You have an appointment with Dr. Gwenlyn Saran on 12/18 at 8:30 to have the cyst removed. If you have any systemic symptoms (fever/chills, nausea, vomiting, etc) or if the area on your chest gets red or inflammed, painful, or gets larger, return to the clinic sooner. --Dr. Casper Harrison

## 2012-06-25 ENCOUNTER — Ambulatory Visit: Payer: 59 | Admitting: Family Medicine

## 2012-07-03 ENCOUNTER — Ambulatory Visit (INDEPENDENT_AMBULATORY_CARE_PROVIDER_SITE_OTHER): Payer: 59 | Admitting: Family Medicine

## 2012-07-03 VITALS — BP 115/78 | HR 74 | Ht 62.0 in | Wt 152.0 lb

## 2012-07-03 DIAGNOSIS — L720 Epidermal cyst: Secondary | ICD-10-CM

## 2012-07-03 DIAGNOSIS — L723 Sebaceous cyst: Secondary | ICD-10-CM

## 2012-07-03 NOTE — Assessment & Plan Note (Signed)
Removed today. Care instruction and red flags for return given

## 2012-07-03 NOTE — Patient Instructions (Addendum)
Keep the area clean and dry. You can use polymycin, neosporyn or simple vaseline on area, covered with bandaid.   Cyst Removal Your caregiver has removed a cyst. A cyst is a sac containing a semi-solid material. Cysts may occur any place on your body. They may remain small for years or gradually get larger. A sebaceous cyst is an enlarged (dilated) sweat gland filled with old sweat (sebum). Unattended, these may become large (the size of a softball) over several years time. These are often removed for improved appearance (cosmetic) reasons or before they become infected to form an abscess. An abscess is an infected cyst. HOME CARE INSTRUCTIONS   Keep your bandage clean and dry. You may change your bandage after 24 hours. If your bandage sticks, use warm water to gently loosen it. Pat the area dry with a clean towel before putting on another bandage.  If possible, keep the area where the cyst was removed raised to relieve soreness, swelling, and promote healing.  If you have stitches, keep them clean and dry.  You may clean your stitches gently with a cotton swab dipped in warm soapy water.  Do not soak the area where the cyst was removed or go swimming. You may shower.  Do not overuse the area where your cyst was removed.  Return in 7 days or as directed to have your stitches removed.  Take medicines as instructed by your caregiver. SEEK IMMEDIATE MEDICAL CARE IF:   An oral temperature above 102 F (38.9 C) develops, not controlled by medication.  Blood continues to soak through the bandage.  You have increasing pain in the area where your cyst was removed.  You have redness, swelling, pus, a bad smell, soreness (inflammation), or red streaks coming away from the stitches. These are signs of infection. MAKE SURE YOU:   Understand these instructions.  Will watch your condition.  Will get help right away if you are not doing well or get worse. Document Released: 06/30/2000  Document Revised: 09/25/2011 Document Reviewed: 10/24/2007 [facility] Patient Information 2013 [place], [place].

## 2012-07-03 NOTE — Progress Notes (Signed)
Patient ID: Nichole Wilson    DOB: November 07, 1960, 51 y.o.   MRN: [id number] --- Subjective:  Nichole Wilson is a 51 y.o.female who presents for cyst removal.  Located between breasts. Present for 3 years, feels like it is becoming deeper and more prominent recently.    ROS: see HPI Past Medical History: reviewed and updated medications and allergies. Social History: Tobacco: none  Objective: Filed Vitals:   07/03/12 0831  BP: 115/78  Pulse: 74    Physical Examination:   General appearance - alert, well appearing, and in no distress Skin - 1cm nodule with small pore at periphery, non fluctuent, firm and non tender, located between breast  Procedure Note:  Cyst removal:  Performed by: Marena Chancy Consent: signed consent obtained. Risks and benefits: risks, benefits and alternatives were discussed Time out performed prior to procedure Type: abscess Body area: sternum Anesthesia: local infiltration Incision was made with a scalpel, 11 blade, 1-2 mm incision made Local anesthetic: lidocaine 2% epinephrine 1:200000 Anesthetic total: 3 ml Blunt dissection performed to break up adhesion and release capsule. Whitish material expressed and cyst capsule/sack removed.  Blunt dissection to break up loculations Drainage: purulent Patient tolerance: Patient tolerated the procedure well with no immediate complications.

## 2012-07-08 ENCOUNTER — Encounter: Payer: Self-pay | Admitting: Family Medicine

## 2012-07-08 ENCOUNTER — Ambulatory Visit (INDEPENDENT_AMBULATORY_CARE_PROVIDER_SITE_OTHER): Payer: 59 | Admitting: Family Medicine

## 2012-07-08 VITALS — BP 142/86 | HR 102 | Temp 97.8°F | Wt 142.0 lb

## 2012-07-08 DIAGNOSIS — L720 Epidermal cyst: Secondary | ICD-10-CM

## 2012-07-08 DIAGNOSIS — L723 Sebaceous cyst: Secondary | ICD-10-CM

## 2012-07-08 NOTE — Progress Notes (Signed)
  Subjective:    Patient ID: Nichole Wilson, female    DOB: Jul 29, 1960, 51 y.o.   MRN: [id number]  HPI  5 days ago had epidermal includion cyst on chest wall lanced.    Patient returns today, notes cyst is sore, was able to express white thick material out of it.  Requests re-excision  Review of Systems See HPI    Objective:   Physical Exam GEN: NAD Skin:  Small scab at area of initial lancing.  Cyst was approx 2 mm ? Palpable today.  No fluctuance.  Mild erythema        Assessment & Plan:

## 2012-07-08 NOTE — Assessment & Plan Note (Addendum)
Discussed mild infection.irritation from procedure/squeezing.  Patient would like area excised.  I recommended against it while inflamed and encouraged her to wait until healed as it is very tiny today and has no evidence of re accumulating already.  I also recommended that unless becomes larger or symptomatic, would not recommend excision as this is benign lesion, and very small, the procedure would cause more scarring than the lesion itself.  Patient is agreeable to wait until it heals before deciding on re-evaluation of excision.  Will continue neosporin, warm compresses.

## 2012-07-08 NOTE — Patient Instructions (Addendum)
Use neosporin and warm compresses  If after inflammation healed and you notice it getting bigger or other concerns, can make appointment with Dr. Gwenlyn Saran to re-excise it

## 2012-11-04 ENCOUNTER — Other Ambulatory Visit: Payer: Self-pay | Admitting: Family Medicine

## 2012-11-04 DIAGNOSIS — Z780 Asymptomatic menopausal state: Secondary | ICD-10-CM

## 2012-11-08 ENCOUNTER — Other Ambulatory Visit: Payer: Self-pay | Admitting: Family Medicine

## 2012-11-08 ENCOUNTER — Ambulatory Visit
Admission: RE | Admit: 2012-11-08 | Discharge: 2012-11-08 | Disposition: A | Discharge status: Home / Self Care | Payer: 59 | Source: Ambulatory Visit | Attending: Family Medicine | Admitting: Family Medicine

## 2012-11-08 ENCOUNTER — Other Ambulatory Visit: Payer: Self-pay | Admitting: *Deleted

## 2012-11-08 DIAGNOSIS — N63 Unspecified lump in unspecified breast: Secondary | ICD-10-CM

## 2012-11-12 ENCOUNTER — Telehealth: Payer: Self-pay | Admitting: Family Medicine

## 2012-11-12 DIAGNOSIS — Z0000 Encounter for general adult medical examination without abnormal findings: Secondary | ICD-10-CM

## 2012-11-12 NOTE — Telephone Encounter (Signed)
Pt needs a referral for a Bone Density pt really appreciate if you referral her to [facility]. Pt needs a Bone Densi due a dental problem(dentist recommended) .  Thank You   [place]

## 2012-11-13 ENCOUNTER — Other Ambulatory Visit: Payer: Self-pay | Admitting: Family Medicine

## 2012-11-13 ENCOUNTER — Ambulatory Visit
Admission: RE | Admit: 2012-11-13 | Discharge: 2012-11-13 | Disposition: A | Discharge status: Home / Self Care | Payer: 59 | Source: Ambulatory Visit | Attending: Family Medicine | Admitting: Family Medicine

## 2012-11-13 DIAGNOSIS — N63 Unspecified lump in unspecified breast: Secondary | ICD-10-CM

## 2012-11-13 NOTE — Telephone Encounter (Signed)
Attempted to call patient, unable to leave a message. Please tell her she has the Dexa scan this Friday 11/15/12 at 9:15 at [facility], [facility]

## 2012-11-15 ENCOUNTER — Ambulatory Visit
Admission: RE | Admit: 2012-11-15 | Discharge: 2012-11-15 | Disposition: A | Discharge status: Home / Self Care | Payer: 59 | Source: Ambulatory Visit | Attending: Family Medicine | Admitting: Family Medicine

## 2012-11-15 ENCOUNTER — Other Ambulatory Visit: Payer: Self-pay | Admitting: Family Medicine

## 2012-11-15 ENCOUNTER — Ambulatory Visit (HOSPITAL_COMMUNITY)
Admission: RE | Admit: 2012-11-15 | Discharge: 2012-11-15 | Disposition: A | Discharge status: Home / Self Care | Payer: 59 | Source: Ambulatory Visit | Attending: Family Medicine | Admitting: Family Medicine

## 2012-11-15 ENCOUNTER — Encounter (HOSPITAL_COMMUNITY): Payer: Self-pay

## 2012-11-15 DIAGNOSIS — Z780 Asymptomatic menopausal state: Secondary | ICD-10-CM | POA: Insufficient documentation

## 2012-11-15 DIAGNOSIS — Z0000 Encounter for general adult medical examination without abnormal findings: Secondary | ICD-10-CM

## 2012-11-15 DIAGNOSIS — N63 Unspecified lump in unspecified breast: Secondary | ICD-10-CM

## 2012-11-15 DIAGNOSIS — N6489 Other specified disorders of breast: Secondary | ICD-10-CM

## 2012-11-15 DIAGNOSIS — C50911 Malignant neoplasm of unspecified site of right female breast: Secondary | ICD-10-CM

## 2012-11-15 DIAGNOSIS — Z13820 Encounter for screening for osteoporosis: Secondary | ICD-10-CM | POA: Insufficient documentation

## 2012-11-20 ENCOUNTER — Encounter: Payer: Self-pay | Admitting: Family Medicine

## 2012-11-29 ENCOUNTER — Encounter (INDEPENDENT_AMBULATORY_CARE_PROVIDER_SITE_OTHER): Payer: Self-pay | Admitting: Surgery

## 2013-03-21 ENCOUNTER — Encounter: Payer: Self-pay | Admitting: Internal Medicine

## 2013-03-21 ENCOUNTER — Encounter: Payer: Self-pay | Admitting: Family Medicine

## 2013-03-21 ENCOUNTER — Ambulatory Visit (INDEPENDENT_AMBULATORY_CARE_PROVIDER_SITE_OTHER): Payer: 59 | Admitting: Family Medicine

## 2013-03-21 VITALS — BP 122/85 | HR 84 | Ht 62.0 in | Wt 149.0 lb

## 2013-03-21 DIAGNOSIS — L723 Sebaceous cyst: Secondary | ICD-10-CM

## 2013-03-21 DIAGNOSIS — L720 Epidermal cyst: Secondary | ICD-10-CM

## 2013-03-21 DIAGNOSIS — Z0000 Encounter for general adult medical examination without abnormal findings: Secondary | ICD-10-CM

## 2013-03-21 DIAGNOSIS — E785 Hyperlipidemia, unspecified: Secondary | ICD-10-CM

## 2013-03-21 MED ORDER — FENOFIBRATE 160 MG PO TABS: 160.0000 mg | ORAL_TABLET | Freq: Every day | ORAL | Status: DC

## 2013-03-21 NOTE — Patient Instructions (Addendum)
For the colonoscopy, please call your gastroenterologist to make the appointment.  For the PAP smears, you are due for one the first part of 2016.  For the cholesterol, make a lab appointment in 3 months to repeat lab work.   Follow up with me in 6 months.

## 2013-03-21 NOTE — Progress Notes (Signed)
Patient ID: Nichole Wilson    DOB: 1961/02/09, 52 y.o.   MRN: [id number] --- Subjective:  Nichole Wilson is a 52 y.o.female with h/o hypertryglyciridemia who presents for routine health exam.    - cyst in between breasts: was excised on 07/03/13 but has since recurred. It is growing and aggravating her. No drainage, no fever, no chills  - Gynecology screen: LMP: May 2013. Has only occasional hot flashes.much better than before. No vaginal bleeding since. No vaginal discharge.  Last PAP: 12/22/11: normal: no history of abnormal PAP smears.  Last screening mammogram:  11/08/12: Ultrasound is performed, showing mild lobulated hypoechoic lesion  with through transmission at the right breast six o'clock  subareolar palpable area measuring 1.7 x 0.7 x 1.5 cm.  Patietnt then underwent core needle biopsy that required outside hospital pathology consultation: - BENIGN BREAST PARENCHYMA WITH DUCT ECTASIA, ACUTE AND CHRONIC INFLAMMATORY INFILTRATE AND REACTIVE EPITHELIAL CHANGES  - hypertriglyciridemia: patient brings her most recent labwork that was done by [facility]. It shows triglycerides of 800. Patient has a h/o elevated triglycerides. She admits to not following a very healthy diet. She doesn't exercise on a regular basis. She ran out of her fenofibrate medicine 6 months ago and has not taken it.  She had an episode of pancreatitis last year, which was deemed to be from gallstones.   ROS: see HPI Past Medical History: reviewed and updated medications and allergies. Social History: Tobacco: none  Objective: Filed Vitals:   03/21/13 0856  BP: 122/85  Pulse: 84    Physical Examination:   General appearance - alert, well appearing, and in no distress Mouth - removable fake upper front teeth, patient has otherwise missing upper front teeth Neck - supple, no significant adenopathy Chest - clear to auscultation, no wheezes, rales or rhonchi, symmetric air entry Heart - normal rate, regular rhythm,  normal S1, S2, no murmurs Abdomen - soft, nontender, nondistended, no masses or organomegaly Extremities - no edema Skin - 0.5cm round firm, non fluctuant cystic structure located between breast

## 2013-03-24 DIAGNOSIS — Z0000 Encounter for general adult medical examination without abnormal findings: Secondary | ICD-10-CM | POA: Insufficient documentation

## 2013-03-24 HISTORY — DX: Encounter for general adult medical examination without abnormal findings: Z00.00

## 2013-03-24 NOTE — Assessment & Plan Note (Signed)
Has recurred and is growing since. -referral to dermatology

## 2013-03-24 NOTE — Assessment & Plan Note (Signed)
Elevated triglycirides. Close to threshold that could induce pancreatitis. Explained the risks of elevated trig vs LDL.  Will treat with fenofibrate.  Follow up labwork in 3 months.

## 2013-03-24 NOTE — Assessment & Plan Note (Signed)
-   mammogram: patient received recommendations to follow up with general surgery regarding breast mass, although benign in nature.  - PAP smear: normal 12/22/11. No abnormal PAPs. Repeat in June 2016 - colonoscopy: recommended to patient to make appointment with GI doctor for screening colonoscopy.

## 2013-04-14 ENCOUNTER — Encounter (INDEPENDENT_AMBULATORY_CARE_PROVIDER_SITE_OTHER): Payer: Self-pay

## 2013-05-02 ENCOUNTER — Ambulatory Visit (AMBULATORY_SURGERY_CENTER): Payer: Self-pay | Admitting: *Deleted

## 2013-05-02 VITALS — Ht 62.0 in | Wt 150.6 lb

## 2013-05-02 DIAGNOSIS — R195 Other fecal abnormalities: Secondary | ICD-10-CM

## 2013-05-02 MED ORDER — MOVIPREP 100 G PO SOLR: ORAL | Status: DC

## 2013-05-02 NOTE — Progress Notes (Signed)
No allergies to eggs or soy. No problems with anesthesia.  

## 2013-05-12 ENCOUNTER — Encounter: Payer: 59 | Admitting: Internal Medicine

## 2013-05-12 ENCOUNTER — Encounter: Payer: 59 | Admitting: Gastroenterology

## 2013-06-17 NOTE — Addendum Note (Signed)
Addended by: Maple Hudson on: 06/17/2013 12:31 PM   Modules accepted: Level of Service

## 2013-09-03 ENCOUNTER — Encounter: Payer: Self-pay | Admitting: Family Medicine

## 2013-09-03 ENCOUNTER — Other Ambulatory Visit: Payer: Self-pay | Admitting: Dermatology

## 2013-09-03 ENCOUNTER — Ambulatory Visit (INDEPENDENT_AMBULATORY_CARE_PROVIDER_SITE_OTHER): Payer: 59 | Admitting: Family Medicine

## 2013-09-03 VITALS — BP 136/90 | HR 92 | Ht 62.0 in | Wt 151.0 lb

## 2013-09-03 DIAGNOSIS — Z0000 Encounter for general adult medical examination without abnormal findings: Secondary | ICD-10-CM

## 2013-09-03 DIAGNOSIS — E785 Hyperlipidemia, unspecified: Secondary | ICD-10-CM

## 2013-09-03 DIAGNOSIS — I10 Essential (primary) hypertension: Secondary | ICD-10-CM

## 2013-09-03 DIAGNOSIS — R7309 Other abnormal glucose: Secondary | ICD-10-CM

## 2013-09-03 DIAGNOSIS — M722 Plantar fascial fibromatosis: Secondary | ICD-10-CM

## 2013-09-03 MED ORDER — HYDROCHLOROTHIAZIDE 12.5 MG PO TABS: 12.5000 mg | ORAL_TABLET | Freq: Every day | ORAL | Status: DC

## 2013-09-03 NOTE — Patient Instructions (Signed)
I will call you if any of the lab results are abnormal.  Please follow up in 1 month to see how the blood pressure medicine is doing.      Plantar Fasciitis (Heel Spur Syndrome) with Rehab The plantar fascia is a fibrous, ligament-like, soft-tissue structure that spans the bottom of the foot. Plantar fasciitis is a condition that causes pain in the foot due to inflammation of the tissue. SYMPTOMS   Pain and tenderness on the underneath side of the foot.  Pain that worsens with standing or walking. CAUSES  Plantar fasciitis is caused by irritation and injury to the plantar fascia on the underneath side of the foot. Common mechanisms of injury include:  Direct trauma to bottom of the foot.  Damage to a small nerve that runs under the foot where the main fascia attaches to the heel bone.  Stress placed on the plantar fascia due to bone spurs. RISK INCREASES WITH:   Activities that place stress on the plantar fascia (running, jumping, pivoting, or cutting).  Poor strength and flexibility.  Improperly fitted shoes.  Tight calf muscles.  Flat feet.  Failure to warm-up properly before activity.  Obesity. PREVENTION  Warm up and stretch properly before activity.  Allow for adequate recovery between workouts.  Maintain physical fitness:  Strength, flexibility, and endurance.  Cardiovascular fitness.  Maintain a health body weight.  Avoid stress on the plantar fascia.  Wear properly fitted shoes, including arch supports for individuals who have flat feet. PROGNOSIS  If treated properly, then the symptoms of plantar fasciitis usually resolve without surgery. However, occasionally surgery is necessary. RELATED COMPLICATIONS   Recurrent symptoms that may result in a chronic condition.  Problems of the lower back that are caused by compensating for the injury, such as limping.  Pain or weakness of the foot during push-off following surgery.  Chronic inflammation,  scarring, and partial or complete fascia tear, occurring more often from repeated injections. TREATMENT  Treatment initially involves the use of ice and medication to help reduce pain and inflammation. The use of strengthening and stretching exercises may help reduce pain with activity, especially stretches of the Achilles tendon. These exercises may be performed at home or with a therapist. Your caregiver may recommend that you use heel cups of arch supports to help reduce stress on the plantar fascia. Occasionally, corticosteroid injections are given to reduce inflammation. If symptoms persist for greater than 6 months despite non-surgical (conservative), then surgery may be recommended.  MEDICATION   If pain medication is necessary, then nonsteroidal anti-inflammatory medications, such as aspirin and ibuprofen, or other minor pain relievers, such as acetaminophen, are often recommended.  Do not take pain medication within 7 days before surgery.  Prescription pain relievers may be given if deemed necessary by your caregiver. Use only as directed and only as much as you need.  Corticosteroid injections may be given by your caregiver. These injections should be reserved for the most serious cases, because they may only be given a certain number of times. HEAT AND COLD  Cold treatment (icing) relieves pain and reduces inflammation. Cold treatment should be applied for 10 to 15 minutes every 2 to 3 hours for inflammation and pain and immediately after any activity that aggravates your symptoms. Use ice packs or massage the area with a piece of ice (ice massage).  Heat treatment may be used prior to performing the stretching and strengthening activities prescribed by your caregiver, physical therapist, or athletic trainer. Use a heat  pack or soak the injury in warm water. SEEK IMMEDIATE MEDICAL CARE IF:  Treatment seems to offer no benefit, or the condition worsens.  Any medications produce adverse  side effects. EXERCISES RANGE OF MOTION (ROM) AND STRETCHING EXERCISES - Plantar Fasciitis (Heel Spur Syndrome) These exercises may help you when beginning to rehabilitate your injury. Your symptoms may resolve with or without further involvement from your physician, physical therapist or athletic trainer. While completing these exercises, remember:   Restoring tissue flexibility helps normal motion to return to the joints. This allows healthier, less painful movement and activity.  An effective stretch should be held for at least 30 seconds.  A stretch should never be painful. You should only feel a gentle lengthening or release in the stretched tissue. RANGE OF MOTION - Toe Extension, Flexion  Sit with your right / left leg crossed over your opposite knee.  Grasp your toes and gently pull them back toward the top of your foot. You should feel a stretch on the bottom of your toes and/or foot.  Hold this stretch for __________ seconds.  Now, gently pull your toes toward the bottom of your foot. You should feel a stretch on the top of your toes and or foot.  Hold this stretch for __________ seconds. Repeat __________ times. Complete this stretch __________ times per day.  RANGE OF MOTION - Ankle Dorsiflexion, Active Assisted  Remove shoes and sit on a chair that is preferably not on a carpeted surface.  Place right / left foot under knee. Extend your opposite leg for support.  Keeping your heel down, slide your right / left foot back toward the chair until you feel a stretch at your ankle or calf. If you do not feel a stretch, slide your bottom forward to the edge of the chair, while still keeping your heel down.  Hold this stretch for __________ seconds. Repeat __________ times. Complete this stretch __________ times per day.  STRETCH  Gastroc, Standing  Place hands on wall.  Extend right / left leg, keeping the front knee somewhat bent.  Slightly point your toes inward on your  back foot.  Keeping your right / left heel on the floor and your knee straight, shift your weight toward the wall, not allowing your back to arch.  You should feel a gentle stretch in the right / left calf. Hold this position for __________ seconds. Repeat __________ times. Complete this stretch __________ times per day. STRETCH  Soleus, Standing  Place hands on wall.  Extend right / left leg, keeping the other knee somewhat bent.  Slightly point your toes inward on your back foot.  Keep your right / left heel on the floor, bend your back knee, and slightly shift your weight over the back leg so that you feel a gentle stretch deep in your back calf.  Hold this position for __________ seconds. Repeat __________ times. Complete this stretch __________ times per day. STRETCH  Gastrocsoleus, Standing  Note: This exercise can place a lot of stress on your foot and ankle. Please complete this exercise only if specifically instructed by your caregiver.   Place the ball of your right / left foot on a step, keeping your other foot firmly on the same step.  Hold on to the wall or a rail for balance.  Slowly lift your other foot, allowing your body weight to press your heel down over the edge of the step.  You should feel a stretch in your right /  left calf.  Hold this position for __________ seconds.  Repeat this exercise with a slight bend in your right / left knee. Repeat __________ times. Complete this stretch __________ times per day.  STRENGTHENING EXERCISES - Plantar Fasciitis (Heel Spur Syndrome)  These exercises may help you when beginning to rehabilitate your injury. They may resolve your symptoms with or without further involvement from your physician, physical therapist or athletic trainer. While completing these exercises, remember:   Muscles can gain both the endurance and the strength needed for everyday activities through controlled exercises.  Complete these exercises as  instructed by your physician, physical therapist or athletic trainer. Progress the resistance and repetitions only as guided. STRENGTH - Towel Curls  Sit in a chair positioned on a non-carpeted surface.  Place your foot on a towel, keeping your heel on the floor.  Pull the towel toward your heel by only curling your toes. Keep your heel on the floor.  If instructed by your physician, physical therapist or athletic trainer, add ____________________ at the end of the towel. Repeat __________ times. Complete this exercise __________ times per day. STRENGTH - Ankle Inversion  Secure one end of a rubber exercise band/tubing to a fixed object (table, pole). Loop the other end around your foot just before your toes.  Place your fists between your knees. This will focus your strengthening at your ankle.  Slowly, pull your big toe up and in, making sure the band/tubing is positioned to resist the entire motion.  Hold this position for __________ seconds.  Have your muscles resist the band/tubing as it slowly pulls your foot back to the starting position. Repeat __________ times. Complete this exercises __________ times per day.  Document Released: 07/03/2005 Document Revised: 09/25/2011 Document Reviewed: 10/15/2008 [facility] Patient Information 2014 [place], [place].

## 2013-09-04 NOTE — Progress Notes (Signed)
Patient ID: Nichole Wilson    DOB: 1961-03-27, 53 y.o.   MRN: [id number] --- Subjective:  Nichole Wilson is a 53 y.o.female who presents for physical exam.  - HTN: patient checks BP at home and it has been running in the 300'P and 233'A systolic. She used to be on hctz which she hasn't taken in several months. She denies any chest pain, shortness of breath, palpitations, lower extremity swelling.   - pain on bottom of left foot: ongoing for several weeks. Worst in the morning when she first steps on ground. Worst at end of day. Better with rest. Pain is sharp pain.   - health maintenance:  Last PAP smear: 01/01/2012 normal. No history of abnormal PAP smear.  - mammogram: last mammogram: 11/08/12: mild lobulated hypoechoic lesion with through transmission at the right breast six o'clock subareolar palpable area measuring 1.7 x 0.7 x 1.5 cm. Had US guided biopsy which showed benign breast parenchyma with duct ectasia, acute on chronic inflammatory infiltrate and reactive epithelial changes.  Appointment with surgery for consideration of excisional biopsy was recommended at breast center. Patient prefers to wait.    ROS: see HPI Past Medical History: reviewed and updated medications and allergies. Social History: Tobacco: none  Objective: Filed Vitals:   09/03/13 1553  BP: 136/90  Pulse: 92    Physical Examination:   General appearance - alert, well appearing, and in no distress Ears - bilateral TM's and external ear canals normal Nose - normal and patent, no erythema, discharge or polyps Mouth - mucous membranes moist, pharynx normal without lesions Neck - supple, no significant adenopathy Chest - clear to auscultation, no wheezes, rales or rhonchi, symmetric air entry Heart - normal rate, regular rhythm, normal S1, S2, no murmurs Extremities - no pedal edema Left foot - no tenderness along plantar aspect of heel, no tenderness along calcaneal insertion.  Falling longitudinal and trasnverse  arch  No achilles tenderness, normal ankle range of motion.

## 2013-09-05 DIAGNOSIS — M722 Plantar fascial fibromatosis: Secondary | ICD-10-CM

## 2013-09-05 DIAGNOSIS — I10 Essential (primary) hypertension: Secondary | ICD-10-CM | POA: Insufficient documentation

## 2013-09-05 HISTORY — DX: Plantar fascial fibromatosis: M72.2

## 2013-09-05 NOTE — Assessment & Plan Note (Signed)
Will start back on hctz 12.5mg  daily

## 2013-09-05 NOTE — Assessment & Plan Note (Signed)
-   trial of off the shelf orthotics from [employer]. Handout given - stretches prior to stepping foot on ground in am - follow up as needed.  - if not improved, consider sports medicine referral

## 2013-09-05 NOTE — Assessment & Plan Note (Signed)
Repeat mammogram in April 2015 Patient not due for PAP smear until 2016.

## 2013-09-05 NOTE — Assessment & Plan Note (Signed)
Patient to return for fasting lipid panel and CMP

## 2013-09-05 NOTE — Assessment & Plan Note (Signed)
Patient to return for labs. Will obtain A1C

## 2013-09-12 ENCOUNTER — Other Ambulatory Visit (INDEPENDENT_AMBULATORY_CARE_PROVIDER_SITE_OTHER): Payer: 59

## 2013-09-12 ENCOUNTER — Other Ambulatory Visit: Payer: 59

## 2013-09-12 DIAGNOSIS — R7309 Other abnormal glucose: Secondary | ICD-10-CM

## 2013-09-12 DIAGNOSIS — I10 Essential (primary) hypertension: Secondary | ICD-10-CM

## 2013-09-12 LAB — COMPREHENSIVE METABOLIC PANEL
ALBUMIN: 4.5 g/dL (ref 3.5–5.2)
ALK PHOS: 123 U/L — AB (ref 39–117)
ALT: 17 U/L (ref 0–35)
AST: 16 U/L (ref 0–37)
BILIRUBIN TOTAL: 0.7 mg/dL (ref 0.2–1.2)
BUN: 12 mg/dL (ref 6–23)
CO2: 28 mEq/L (ref 19–32)
Calcium: 9.8 mg/dL (ref 8.4–10.5)
Chloride: 103 mEq/L (ref 96–112)
Creat: 0.56 mg/dL (ref 0.50–1.10)
GLUCOSE: 111 mg/dL — AB (ref 70–99)
POTASSIUM: 4.3 meq/L (ref 3.5–5.3)
SODIUM: 136 meq/L (ref 135–145)
TOTAL PROTEIN: 7.9 g/dL (ref 6.0–8.3)

## 2013-09-12 LAB — LIPID PANEL
Cholesterol: 244 mg/dL — ABNORMAL HIGH (ref 0–200)
HDL: 49 mg/dL (ref >39)
TRIGLYCERIDES: 488 mg/dL — AB (ref <150)
Total CHOL/HDL Ratio: 5 Ratio

## 2013-09-12 LAB — POCT GLYCOSYLATED HEMOGLOBIN (HGB A1C): HEMOGLOBIN A1C: 5.8

## 2013-09-12 NOTE — Progress Notes (Signed)
CMP,FLP AND A1C DONE TODAY Nichole Wilson 

## 2013-09-17 ENCOUNTER — Encounter: Payer: Self-pay | Admitting: Family Medicine

## 2013-09-17 ENCOUNTER — Other Ambulatory Visit: Payer: Self-pay | Admitting: Family Medicine

## 2013-09-17 DIAGNOSIS — E781 Pure hyperglyceridemia: Secondary | ICD-10-CM

## 2013-09-17 MED ORDER — OMEGA-3-ACID ETHYL ESTERS 1 G PO CAPS: 2.0000 g | ORAL_CAPSULE | Freq: Two times a day (BID) | ORAL | Status: DC

## 2013-09-17 NOTE — Progress Notes (Signed)
Called patient to let her know of her lab results. Triglicyrides are higher than prior. She stopped taking gemfibrozil a while ago because of muscle aches. She is not taking fish oil either.  Will restart lovaza and repeat labs in 3 months. If not better, could consider starting her on lipitor 20mg  daily.  Patient expressed understanding and agreed with plan.   Liam Graham, PGY-3 Family Medicine Resident

## 2013-10-01 ENCOUNTER — Encounter: Payer: Self-pay | Admitting: Family Medicine

## 2013-10-01 ENCOUNTER — Ambulatory Visit (INDEPENDENT_AMBULATORY_CARE_PROVIDER_SITE_OTHER): Payer: 59 | Admitting: Family Medicine

## 2013-10-01 VITALS — BP 120/86 | HR 88 | Temp 97.9°F | Ht 62.0 in | Wt 149.0 lb

## 2013-10-01 DIAGNOSIS — I10 Essential (primary) hypertension: Secondary | ICD-10-CM

## 2013-10-01 DIAGNOSIS — E781 Pure hyperglyceridemia: Secondary | ICD-10-CM

## 2013-10-01 DIAGNOSIS — E785 Hyperlipidemia, unspecified: Secondary | ICD-10-CM

## 2013-10-01 LAB — BASIC METABOLIC PANEL
BUN: 13 mg/dL (ref 6–23)
CHLORIDE: 102 meq/L (ref 96–112)
CO2: 29 mEq/L (ref 19–32)
Calcium: 9.6 mg/dL (ref 8.4–10.5)
Creat: 0.68 mg/dL (ref 0.50–1.10)
Glucose, Bld: 125 mg/dL — ABNORMAL HIGH (ref 70–99)
POTASSIUM: 4.1 meq/L (ref 3.5–5.3)
SODIUM: 139 meq/L (ref 135–145)

## 2013-10-01 MED ORDER — FENOFIBRATE 160 MG PO TABS: 160.0000 mg | ORAL_TABLET | Freq: Every day | ORAL | Status: DC

## 2013-10-01 NOTE — Assessment & Plan Note (Signed)
Patient would like to try and start back fenofibrate. She had had some elbow joint pain with it but would like to try again.  Recommended that if she starts having pain, she can try to take med every other day until pain is better.  Follow up in 3 months for lipid panel.

## 2013-10-01 NOTE — Assessment & Plan Note (Signed)
Well controled with hctz 12.5mg  daily. Check BMP to monitor Cr and K.

## 2013-10-01 NOTE — Progress Notes (Signed)
Patient ID: Nichole Wilson    DOB: 09-Aug-1960, 53 y.o.   MRN: [id number] --- Subjective:  Nichole Wilson is a 53 y.o.female who presents for follow up on HTN.   # Hypertension: Medications: HCTZ 12.5mg  started 09/03/13  Number of doses missed in 1 week: 0 BP at home: elevated last week in the 180's.  Exercise routine: walks and does zumba 3 times per week Salt in diet: tries to watch the amount of salt  Chest pain: none Lower extremity swelling: none Shortness of breath: none Headache: none Change in vision: none  ROS: see HPI Past Medical History: reviewed and updated medications and allergies. Social History: Tobacco: none  Objective: Filed Vitals:   10/01/13 0907  BP: 120/86  Pulse: 88  Temp: 97.9 F (36.6 C)    Physical Examination:   General appearance - alert, well appearing, and in no distress Neck - supple, no significant adenopathy Chest - clear to auscultation, no wheezes, rales or rhonchi, symmetric air entry Heart - normal rate, regular rhythm, normal S1, S2, no murmurs, rubs, clicks or gallops

## 2013-10-01 NOTE — Patient Instructions (Signed)
Please follow up with me in 3 months after getting fasting labwork. At that time, we will go over your labs and adjust anything that's needed for your cholesterol.   Hypertriglyceridemia  Diet for High blood levels of Triglycerides Most fats in food are triglycerides. Triglycerides in your blood are stored as fat in your body. High levels of triglycerides in your blood may put you at a greater risk for heart disease and stroke.  Normal triglyceride levels are less than 150 mg/dL. Borderline high levels are 150-199 mg/dl. High levels are 200 - 499 mg/dL, and very high triglyceride levels are greater than 500 mg/dL. The decision to treat high triglycerides is generally based on the level. For people with borderline or high triglyceride levels, treatment includes weight loss and exercise. Drugs are recommended for people with very high triglyceride levels. Many people who need treatment for high triglyceride levels have metabolic syndrome. This syndrome is a collection of disorders that often include: insulin resistance, high blood pressure, blood clotting problems, high cholesterol and triglycerides. TESTING PROCEDURE FOR TRIGLYCERIDES  You should not eat 4 hours before getting your triglycerides measured. The normal range of triglycerides is between 10 and 250 milligrams per deciliter (mg/dl). Some people may have extreme levels (1000 or above), but your triglyceride level may be too high if it is above 150 mg/dl, depending on what other risk factors you have for heart disease.  People with high blood triglycerides may also have high blood cholesterol levels. If you have high blood cholesterol as well as high blood triglycerides, your risk for heart disease is probably greater than if you only had high triglycerides. High blood cholesterol is one of the main risk factors for heart disease. CHANGING YOUR DIET  Your weight can affect your blood triglyceride level. If you are more than 20% above your ideal  body weight, you may be able to lower your blood triglycerides by losing weight. Eating less and exercising regularly is the best way to combat this. Fat provides more calories than any other food. The best way to lose weight is to eat less fat. Only 30% of your total calories should come from fat. Less than 7% of your diet should come from saturated fat. A diet low in fat and saturated fat is the same as a diet to decrease blood cholesterol. By eating a diet lower in fat, you may lose weight, lower your blood cholesterol, and lower your blood triglyceride level.  Eating a diet low in fat, especially saturated fat, may also help you lower your blood triglyceride level. Ask your dietitian to help you figure how much fat you can eat based on the number of calories your caregiver has prescribed for you.  Exercise, in addition to helping with weight loss may also help lower triglyceride levels.   Alcohol can increase blood triglycerides. You may need to stop drinking alcoholic beverages.  Too much carbohydrate in your diet may also increase your blood triglycerides. Some complex carbohydrates are necessary in your diet. These may include bread, rice, potatoes, other starchy vegetables and cereals.  Reduce "simple" carbohydrates. These may include pure sugars, candy, honey, and jelly without losing other nutrients. If you have the kind of high blood triglycerides that is affected by the amount of carbohydrates in your diet, you will need to eat less sugar and less high-sugar foods. Your caregiver can help you with this.  Adding 2-4 grams of fish oil (EPA+ DHA) may also help lower triglycerides. Speak with your  caregiver before adding any supplements to your regimen. Following the Diet  Maintain your ideal weight. Your caregivers can help you with a diet. Generally, eating less food and getting more exercise will help you lose weight. Joining a weight control group may also help. Ask your caregivers for a good  weight control group in your area.  Eat low-fat foods instead of high-fat foods. This can help you lose weight too.  These foods are lower in fat. Eat MORE of these:   Dried beans, peas, and lentils.  Egg whites.  Low-fat cottage cheese.  Fish.  Lean cuts of meat, such as round, sirloin, rump, and flank (cut extra fat off meat you fix).  Whole grain breads, cereals and pasta.  Skim and nonfat dry milk.  Low-fat yogurt.  Poultry without the skin.  Cheese made with skim or part-skim milk, such as mozzarella, parmesan, farmers', ricotta, or pot cheese. These are higher fat foods. Eat LESS of these:   Whole milk and foods made from whole milk, such as American, blue, cheddar, monterey jack, and swiss cheese  High-fat meats, such as luncheon meats, sausages, knockwurst, bratwurst, hot dogs, ribs, corned beef, ground pork, and regular ground beef.  Fried foods. Limit saturated fats in your diet. Substituting unsaturated fat for saturated fat may decrease your blood triglyceride level. You will need to read package labels to know which products contain saturated fats.  These foods are high in saturated fat. Eat LESS of these:   Fried pork skins.  Whole milk.  Skin and fat from poultry.  Palm oil.  Butter.  Shortening.  Cream cheese.  Tomasa Blase.  Margarines and baked goods made from listed oils.  Vegetable shortenings.  Chitterlings.  Fat from meats.  Coconut oil.  Palm kernel oil.  Lard.  Cream.  Sour cream.  Fatback.  Coffee whiteners and non-dairy creamers made with these oils.  Cheese made from whole milk. Use unsaturated fats (both polyunsaturated and monounsaturated) moderately. Remember, even though unsaturated fats are better than saturated fats; you still want a diet low in total fat.  These foods are high in unsaturated fat:   Canola oil.  Sunflower oil.  Mayonnaise.  Almonds.  Peanuts.  Pine nuts.  Margarines made with these  oils.  Safflower oil.  Olive oil.  Avocados.  Cashews.  Peanut butter.  Sunflower seeds.  Soybean oil.  Peanut oil.  Olives.  Pecans.  Walnuts.  Pumpkin seeds. Avoid sugar and other high-sugar foods. This will decrease carbohydrates without decreasing other nutrients. Sugar in your food goes rapidly to your blood. When there is excess sugar in your blood, your liver may use it to make more triglycerides. Sugar also contains calories without other important nutrients.  Eat LESS of these:   Sugar, brown sugar, powdered sugar, jam, jelly, preserves, honey, syrup, molasses, pies, candy, cakes, cookies, frosting, pastries, colas, soft drinks, punches, fruit drinks, and regular gelatin.  Avoid alcohol. Alcohol, even more than sugar, may increase blood triglycerides. In addition, alcohol is high in calories and low in nutrients. Ask for sparkling water, or a diet soft drink instead of an alcoholic beverage. Suggestions for planning and preparing meals   Bake, broil, grill or roast meats instead of frying.  Remove fat from meats and skin from poultry before cooking.  Add spices, herbs, lemon juice or vinegar to vegetables instead of salt, rich sauces or gravies.  Use a non-stick skillet without fat or use no-stick sprays.  Cool and refrigerate stews and broth.  Then remove the hardened fat floating on the surface before serving.  Refrigerate meat drippings and skim off fat to make low-fat gravies.  Serve more fish.  Use less butter, margarine and other high-fat spreads on bread or vegetables.  Use skim or reconstituted non-fat dry milk for cooking.  Cook with low-fat cheeses.  Substitute low-fat yogurt or cottage cheese for all or part of the sour cream in recipes for sauces, dips or congealed salads.  Use half yogurt/half mayonnaise in salad recipes.  Substitute evaporated skim milk for cream. Evaporated skim milk or reconstituted non-fat dry milk can be whipped and  substituted for whipped cream in certain recipes.  Choose fresh fruits for dessert instead of high-fat foods such as pies or cakes. Fruits are naturally low in fat. When Dining Out   Order low-fat appetizers such as fruit or vegetable juice, pasta with vegetables or tomato sauce.  Select clear, rather than cream soups.  Ask that dressings and gravies be served on the side. Then use less of them.  Order foods that are baked, broiled, poached, steamed, stir-fried, or roasted.  Ask for margarine instead of butter, and use only a small amount.  Drink sparkling water, unsweetened tea or coffee, or diet soft drinks instead of alcohol or other sweet beverages. QUESTIONS AND ANSWERS ABOUT OTHER FATS IN THE BLOOD: SATURATED FAT, TRANS FAT, AND CHOLESTEROL What is trans fat? Trans fat is a type of fat that is formed when vegetable oil is hardened through a process called hydrogenation. This process helps makes foods more solid, gives them shape, and prolongs their shelf life. Trans fats are also called hydrogenated or partially hydrogenated oils.  What do saturated fat, trans fat, and cholesterol in foods have to do with heart disease? Saturated fat, trans fat, and cholesterol in the diet all raise the level of LDL "bad" cholesterol in the blood. The higher the LDL cholesterol, the greater the risk for coronary heart disease (CHD). Saturated fat and trans fat raise LDL similarly.  What foods contain saturated fat, trans fat, and cholesterol? High amounts of saturated fat are found in animal products, such as fatty cuts of meat, chicken skin, and full-fat dairy products like butter, whole milk, cream, and cheese, and in tropical vegetable oils such as palm, palm kernel, and coconut oil. Trans fat is found in some of the same foods as saturated fat, such as vegetable shortening, some margarines (especially hard or stick margarine), crackers, cookies, baked goods, fried foods, salad dressings, and other  processed foods made with partially hydrogenated vegetable oils. Small amounts of trans fat also occur naturally in some animal products, such as milk products, beef, and lamb. Foods high in cholesterol include liver, other organ meats, egg yolks, shrimp, and full-fat dairy products. How can I use the new food label to make heart-healthy food choices? Check the Nutrition Facts panel of the food label. Choose foods lower in saturated fat, trans fat, and cholesterol. For saturated fat and cholesterol, you can also use the Percent Daily Value (%DV): 5% DV or less is low, and 20% DV or more is high. (There is no %DV for trans fat.) Use the Nutrition Facts panel to choose foods low in saturated fat and cholesterol, and if the trans fat is not listed, read the ingredients and limit products that list shortening or hydrogenated or partially hydrogenated vegetable oil, which tend to be high in trans fat. POINTS TO REMEMBER:   Discuss your risk for heart disease with your caregivers,  and take steps to reduce risk factors.  Change your diet. Choose foods that are low in saturated fat, trans fat, and cholesterol.  Add exercise to your daily routine if it is not already being done. Participate in physical activity of moderate intensity, like brisk walking, for at least 30 minutes on most, and preferably all days of the week. No time? Break the 30 minutes into three, 10-minute segments during the day.  Stop smoking. If you do smoke, contact your caregiver to discuss ways in which they can help you quit.  Do not use street drugs.  Maintain a normal weight.  Maintain a healthy blood pressure.  Keep up with your blood work for checking the fats in your blood as directed by your caregiver. Document Released: 04/20/2004 Document Revised: 01/02/2012 Document Reviewed: 11/16/2008 [facility] Patient Information 2014 [place].

## 2013-10-06 ENCOUNTER — Telehealth: Payer: Self-pay | Admitting: *Deleted

## 2013-10-06 NOTE — Telephone Encounter (Signed)
Called [facility] and they have the fenofibrate ready for pick up. Called patient and informed her.   Liam Graham, PGY-3 Family Medicine Resident

## 2013-10-06 NOTE — Telephone Encounter (Signed)
Pt called and stated she went to pharmacy to pick up medication Fenofibrate 160 mg tab and they told her she could not get medication because someone had already picked up refill.  Pt stated she did not get the medication and only have 4 pills left in bottle.  Pt is unsure who pharmacy gave her medication to.  Please advise.  Refill was sent to pharmacy 10/01/2013.  Derl Barrow, RN

## 2014-01-23 ENCOUNTER — Encounter: Payer: Self-pay | Admitting: Internal Medicine

## 2014-03-03 ENCOUNTER — Other Ambulatory Visit: Payer: Self-pay | Admitting: Family Medicine

## 2014-03-03 ENCOUNTER — Encounter: Payer: Self-pay | Admitting: Family Medicine

## 2014-03-03 DIAGNOSIS — E785 Hyperlipidemia, unspecified: Secondary | ICD-10-CM

## 2014-03-03 MED ORDER — FENOFIBRATE 160 MG PO TABS: 160.0000 mg | ORAL_TABLET | Freq: Every day | ORAL | Status: DC

## 2014-03-06 ENCOUNTER — Ambulatory Visit (AMBULATORY_SURGERY_CENTER): Payer: Self-pay

## 2014-03-06 VITALS — Ht 62.0 in | Wt 149.6 lb

## 2014-03-06 DIAGNOSIS — Z1211 Encounter for screening for malignant neoplasm of colon: Secondary | ICD-10-CM

## 2014-03-06 NOTE — Progress Notes (Signed)
No home oxygen No diet/weight loss meds No allergies to eggs or soy Conscious during c-section, felt pain but unable to hear please reassure pt  Has email  Emmi instructions given for colonoscopy

## 2014-03-19 NOTE — Addendum Note (Signed)
Addended by: Lowry Ram on: 03/19/2014 03:11 PM   Modules accepted: Level of Service

## 2014-03-20 ENCOUNTER — Encounter: Payer: Self-pay | Admitting: Internal Medicine

## 2014-03-20 ENCOUNTER — Ambulatory Visit (AMBULATORY_SURGERY_CENTER): Payer: 59 | Admitting: Internal Medicine

## 2014-03-20 VITALS — BP 120/88 | HR 71 | Temp 97.8°F | Resp 17 | Ht 62.0 in | Wt 149.0 lb

## 2014-03-20 DIAGNOSIS — Z1211 Encounter for screening for malignant neoplasm of colon: Secondary | ICD-10-CM

## 2014-03-20 MED ORDER — SODIUM CHLORIDE 0.9 % IV SOLN: 500.0000 mL | INTRAVENOUS | Status: DC

## 2014-03-20 NOTE — Op Note (Signed)
[place]  [address] [place], [id number]   COLONOSCOPY PROCEDURE REPORT  PATIENT: Nichole, Wilson  MR#: #[id number] BIRTHDATE: 1961-02-25 , 68  yrs. old GENDER: Female ENDOSCOPIST: Lafayette Dragon, MD REFERRED BY Dr Dewaine Oats PROCEDURE DATE:  03/20/2014 PROCEDURE:   Colonoscopy, screening First Screening Colonoscopy - Avg.  risk and is 50 yrs.  old or older Yes.  Prior Negative Screening - Now for repeat screening. N/A  History of Adenoma - Now for follow-up colonoscopy & has been > or = to 3 yrs.  N/A  Polyps Removed Today? No.  Recommend repeat exam, <10 yrs? No. ASA CLASS:   Class I INDICATIONS:Average risk patient for colon cancer. MEDICATIONS: MAC sedation, administered by CRNA and Propofol (Diprivan) 280 mg IV  DESCRIPTION OF PROCEDURE:   After the risks benefits and alternatives of the procedure were thoroughly explained, informed consent was obtained.  A digital rectal exam revealed no abnormalities of the rectum.   The LB [id number]  endoscope was introduced through the anus and advanced to the cecum, which was identified by both the appendix and ileocecal valve. No adverse events experienced.   The quality of the prep was excellent, using MoviPrep  The instrument was then slowly withdrawn as the colon was fully examined.      COLON FINDINGS: Mild diverticulosis was noted throughout the entire examined colon.  Retroflexed views revealed no abnormalities. The time to cecum=3 minutes 47 seconds.  Withdrawal time=6 minutes 08 seconds.  The scope was withdrawn and the procedure completed. COMPLICATIONS: There were no complications.  ENDOSCOPIC IMPRESSION: Mild diverticulosis was noted throughout the entire examined colon  RECOMMENDATIONS: High fiber diet Recall colonoscopy in 10 years   eSigned:  Lafayette Dragon, MD 03/20/2014 10:06 AM   cc:   PATIENT NAME:  Nichole, Wilson MR#: #[id number]

## 2014-03-20 NOTE — Progress Notes (Signed)
Report to PACU, RN, vss, BBS= Clear.  

## 2014-03-20 NOTE — Patient Instructions (Signed)
YOU HAD AN ENDOSCOPIC PROCEDURE TODAY AT THE [facility]: Refer to the procedure report that was given to you for any specific questions about what was found during the examination.  If the procedure report does not answer your questions, please call your gastroenterologist to clarify.  If you requested that your care partner not be given the details of your procedure findings, then the procedure report has been included in a sealed envelope for you to review at your convenience later.  YOU SHOULD EXPECT: Some feelings of bloating in the abdomen. Passage of more gas than usual.  Walking can help get rid of the air that was put into your GI tract during the procedure and reduce the bloating. If you had a lower endoscopy (such as a colonoscopy or flexible sigmoidoscopy) you may notice spotting of blood in your stool or on the toilet paper. If you underwent a bowel prep for your procedure, then you may not have a normal bowel movement for a few days.  DIET: Your first meal following the procedure should be a light meal and then it is ok to progress to your normal diet.  A half-sandwich or bowl of soup is an example of a good first meal.  Heavy or fried foods are harder to digest and may make you feel nauseous or bloated.  Likewise meals heavy in dairy and vegetables can cause extra gas to form and this can also increase the bloating.  Drink plenty of fluids but you should avoid alcoholic beverages for 24 hours.  ACTIVITY: Your care partner should take you home directly after the procedure.  You should plan to take it easy, moving slowly for the rest of the day.  You can resume normal activity the day after the procedure however you should NOT DRIVE or use heavy machinery for 24 hours (because of the sedation medicines used during the test).    SYMPTOMS TO REPORT IMMEDIATELY: A gastroenterologist can be reached at any hour.  During normal business hours, 8:30 AM to 5:00 PM Monday through Friday,  call [phone].  After hours and on weekends, please call the GI answering service at [phone] who will take a message and have the physician on call contact you.   Following lower endoscopy (colonoscopy or flexible sigmoidoscopy):  Excessive amounts of blood in the stool  Significant tenderness or worsening of abdominal pains  Swelling of the abdomen that is new, acute  Fever of 100F or higher    FOLLOW UP: If any biopsies were taken you will be contacted by phone or by letter within the next 1-3 weeks.  Call your gastroenterologist if you have not heard about the biopsies in 3 weeks.  Our staff will call the home number listed on your records the next business day following your procedure to check on you and address any questions or concerns that you may have at that time regarding the information given to you following your procedure. This is a courtesy call and so if there is no answer at the home number and we have not heard from you through the emergency physician on call, we will assume that you have returned to your regular daily activities without incident.  SIGNATURES/CONFIDENTIALITY: You and/or your care partner have signed paperwork which will be entered into your electronic medical record.  These signatures attest to the fact that that the information above on your After Visit Summary has been reviewed and is understood.  Full responsibility of the confidentiality   this discharge information lies with you and/or your care-partner.   Information on diverticulosis and high fiber diet given to you today 

## 2014-03-24 ENCOUNTER — Telehealth: Payer: Self-pay

## 2014-03-24 NOTE — Telephone Encounter (Signed)
  Follow up Call-  Call back number 03/20/2014  Post procedure Call Back phone  # [phone]  Permission to leave phone message Yes     Patient questions:  Do you have a fever, pain , or abdominal swelling? No. Pain Score  0 *  Have you tolerated food without any problems? Yes.    Have you been able to return to your normal activities? Yes.    Do you have any questions about your discharge instructions: Diet   No. Medications  No. Follow up visit  No.  Do you have questions or concerns about your Care? No.  Actions: * If pain score is 4 or above: No action needed, pain <4.

## 2014-05-29 ENCOUNTER — Other Ambulatory Visit: Payer: Self-pay

## 2014-05-29 ENCOUNTER — Other Ambulatory Visit: Payer: Self-pay | Admitting: Family Medicine

## 2014-05-29 DIAGNOSIS — Z1231 Encounter for screening mammogram for malignant neoplasm of breast: Secondary | ICD-10-CM

## 2014-05-29 DIAGNOSIS — N63 Unspecified lump in unspecified breast: Secondary | ICD-10-CM

## 2014-06-01 ENCOUNTER — Other Ambulatory Visit: Payer: Self-pay | Admitting: Family Medicine

## 2014-06-01 DIAGNOSIS — N63 Unspecified lump in unspecified breast: Secondary | ICD-10-CM

## 2014-06-08 ENCOUNTER — Telehealth: Payer: Self-pay | Admitting: Internal Medicine

## 2014-06-08 NOTE — Telephone Encounter (Signed)
Left a message for patient to call back. 

## 2014-06-09 NOTE — Telephone Encounter (Signed)
Patient walked into office asking for OV. Told patient that we do not have appointment for this week. If she needs to be seen urgently, needs to go to ED or Urgent Care. Patient wants to schedule for next week. Scheduled on Monday 06/15/14 with Nicoletta Ba, PA at 2:30 PM.

## 2014-06-09 NOTE — Telephone Encounter (Signed)
Left a message for patient to call back. 

## 2014-06-15 ENCOUNTER — Ambulatory Visit: Payer: 59 | Admitting: Physician Assistant

## 2014-06-25 ENCOUNTER — Ambulatory Visit
Admission: RE | Admit: 2014-06-25 | Discharge: 2014-06-25 | Disposition: A | Discharge status: Home / Self Care | Payer: 59 | Source: Ambulatory Visit | Attending: Family Medicine | Admitting: Family Medicine

## 2014-06-25 ENCOUNTER — Encounter: Payer: Self-pay | Admitting: Family Medicine

## 2014-06-25 ENCOUNTER — Other Ambulatory Visit: Payer: Self-pay | Admitting: Family Medicine

## 2014-06-25 DIAGNOSIS — N63 Unspecified lump in unspecified breast: Secondary | ICD-10-CM

## 2014-06-25 DIAGNOSIS — Z1231 Encounter for screening mammogram for malignant neoplasm of breast: Secondary | ICD-10-CM

## 2014-09-28 ENCOUNTER — Other Ambulatory Visit: Payer: Self-pay | Admitting: *Deleted

## 2014-09-28 ENCOUNTER — Other Ambulatory Visit: Payer: Self-pay | Admitting: Family Medicine

## 2014-09-28 DIAGNOSIS — E785 Hyperlipidemia, unspecified: Secondary | ICD-10-CM

## 2014-09-28 DIAGNOSIS — I10 Essential (primary) hypertension: Secondary | ICD-10-CM

## 2014-09-28 MED ORDER — HYDROCHLOROTHIAZIDE 12.5 MG PO TABS: 12.5000 mg | ORAL_TABLET | Freq: Every day | ORAL | Status: DC

## 2014-09-29 ENCOUNTER — Other Ambulatory Visit: Payer: Self-pay | Admitting: *Deleted

## 2014-09-29 DIAGNOSIS — I10 Essential (primary) hypertension: Secondary | ICD-10-CM

## 2014-09-29 DIAGNOSIS — E785 Hyperlipidemia, unspecified: Secondary | ICD-10-CM

## 2014-09-29 NOTE — Telephone Encounter (Signed)
Pt waned refills on her bp and cholesterol medication. Deseree Kennon Holter, CMA

## 2014-09-30 MED ORDER — HYDROCHLOROTHIAZIDE 12.5 MG PO TABS: 12.5000 mg | ORAL_TABLET | Freq: Every day | ORAL | Status: DC

## 2014-09-30 MED ORDER — FENOFIBRATE 160 MG PO TABS: 160.0000 mg | ORAL_TABLET | Freq: Every day | ORAL | Status: DC

## 2015-01-04 ENCOUNTER — Other Ambulatory Visit: Payer: Self-pay | Admitting: *Deleted

## 2015-01-04 DIAGNOSIS — E785 Hyperlipidemia, unspecified: Secondary | ICD-10-CM

## 2015-01-04 DIAGNOSIS — I10 Essential (primary) hypertension: Secondary | ICD-10-CM

## 2015-01-04 MED ORDER — HYDROCHLOROTHIAZIDE 12.5 MG PO TABS: 12.5000 mg | ORAL_TABLET | Freq: Every day | ORAL | Status: DC

## 2015-01-04 MED ORDER — FENOFIBRATE 160 MG PO TABS: 160.0000 mg | ORAL_TABLET | Freq: Every day | ORAL | Status: DC

## 2015-01-04 NOTE — Telephone Encounter (Signed)
Patient came in with daughter and needs a refill on her medication.  [place] is going [place] of the [place] this weekend and will be out of meds. Jazmin Hartsell,CMA

## 2015-01-04 NOTE — Telephone Encounter (Signed)
Refilled Fenofibrate and HCTZ.  Nobie Putnam, [place], PGY-2

## 2015-05-12 ENCOUNTER — Encounter: Payer: Self-pay | Admitting: Family Medicine

## 2015-05-12 ENCOUNTER — Ambulatory Visit (INDEPENDENT_AMBULATORY_CARE_PROVIDER_SITE_OTHER): Payer: 59 | Admitting: Family Medicine

## 2015-05-12 ENCOUNTER — Other Ambulatory Visit: Payer: Self-pay | Admitting: Family Medicine

## 2015-05-12 ENCOUNTER — Other Ambulatory Visit (HOSPITAL_COMMUNITY)
Admission: RE | Admit: 2015-05-12 | Discharge: 2015-05-12 | Disposition: A | Discharge status: Home / Self Care | Payer: 59 | Source: Ambulatory Visit | Attending: Family Medicine | Admitting: Family Medicine

## 2015-05-12 VITALS — BP 139/87 | HR 94 | Temp 98.2°F | Ht 62.0 in | Wt 152.0 lb

## 2015-05-12 DIAGNOSIS — H15013 Anterior scleritis, bilateral: Secondary | ICD-10-CM

## 2015-05-12 DIAGNOSIS — E119 Type 2 diabetes mellitus without complications: Secondary | ICD-10-CM | POA: Insufficient documentation

## 2015-05-12 DIAGNOSIS — I10 Essential (primary) hypertension: Secondary | ICD-10-CM

## 2015-05-12 DIAGNOSIS — H1013 Acute atopic conjunctivitis, bilateral: Secondary | ICD-10-CM

## 2015-05-12 DIAGNOSIS — E785 Hyperlipidemia, unspecified: Secondary | ICD-10-CM | POA: Diagnosis not present

## 2015-05-12 DIAGNOSIS — Z1151 Encounter for screening for human papillomavirus (HPV): Secondary | ICD-10-CM | POA: Insufficient documentation

## 2015-05-12 DIAGNOSIS — Z01419 Encounter for gynecological examination (general) (routine) without abnormal findings: Secondary | ICD-10-CM | POA: Insufficient documentation

## 2015-05-12 DIAGNOSIS — Z0000 Encounter for general adult medical examination without abnormal findings: Secondary | ICD-10-CM

## 2015-05-12 DIAGNOSIS — Z124 Encounter for screening for malignant neoplasm of cervix: Secondary | ICD-10-CM

## 2015-05-12 DIAGNOSIS — E118 Type 2 diabetes mellitus with unspecified complications: Secondary | ICD-10-CM

## 2015-05-12 DIAGNOSIS — E782 Mixed hyperlipidemia: Secondary | ICD-10-CM

## 2015-05-12 DIAGNOSIS — K760 Fatty (change of) liver, not elsewhere classified: Secondary | ICD-10-CM

## 2015-05-12 DIAGNOSIS — Z114 Encounter for screening for human immunodeficiency virus [HIV]: Secondary | ICD-10-CM

## 2015-05-12 DIAGNOSIS — E1121 Type 2 diabetes mellitus with diabetic nephropathy: Secondary | ICD-10-CM

## 2015-05-12 DIAGNOSIS — E663 Overweight: Secondary | ICD-10-CM | POA: Insufficient documentation

## 2015-05-12 DIAGNOSIS — Z1159 Encounter for screening for other viral diseases: Secondary | ICD-10-CM

## 2015-05-12 DIAGNOSIS — R7303 Prediabetes: Secondary | ICD-10-CM

## 2015-05-12 HISTORY — DX: Type 2 diabetes mellitus without complications: E11.9

## 2015-05-12 HISTORY — DX: Overweight: E66.3

## 2015-05-12 HISTORY — DX: Anterior scleritis, bilateral: H15.013

## 2015-05-12 HISTORY — DX: Type 2 diabetes mellitus with diabetic nephropathy: E11.21

## 2015-05-12 HISTORY — DX: Type 2 diabetes mellitus with unspecified complications: E11.8

## 2015-05-12 LAB — COMPREHENSIVE METABOLIC PANEL
ALK PHOS: 134 U/L — AB (ref 33–130)
ALT: 38 U/L — ABNORMAL HIGH (ref 6–29)
AST: 22 U/L (ref 10–35)
Albumin: 4.4 g/dL (ref 3.6–5.1)
BUN: 13 mg/dL (ref 7–25)
CO2: 26 mmol/L (ref 20–31)
CREATININE: 0.52 mg/dL (ref 0.50–1.05)
Calcium: 10 mg/dL (ref 8.6–10.4)
Chloride: 99 mmol/L (ref 98–110)
Glucose, Bld: 119 mg/dL — ABNORMAL HIGH (ref 65–99)
Potassium: 4.3 mmol/L (ref 3.5–5.3)
Sodium: 137 mmol/L (ref 135–146)
TOTAL PROTEIN: 7.8 g/dL (ref 6.1–8.1)
Total Bilirubin: 0.6 mg/dL (ref 0.2–1.2)

## 2015-05-12 LAB — HEPATITIS C ANTIBODY: HCV Ab: NEGATIVE

## 2015-05-12 LAB — LDL CHOLESTEROL, DIRECT: Direct LDL: 108 mg/dL (ref <130)

## 2015-05-12 LAB — HIV ANTIBODY (ROUTINE TESTING W REFLEX): HIV 1&2 Ab, 4th Generation: NONREACTIVE

## 2015-05-12 MED ORDER — OLOPATADINE HCL 0.1 % OP SOLN: 1.0000 [drp] | Freq: Two times a day (BID) | OPHTHALMIC | Status: DC | PRN

## 2015-05-12 NOTE — Patient Instructions (Signed)
Thank you for coming in to clinic today.  1. Overall it sounds like you are doing well. 2. We will re-check Basic Chemistry (kidney, liver enzeymes) also check Cholesterol (fasting) 3. For your numbness and weakness in fingers, I would recommend resuming your wrist splint at night, and change the way you hold your book at night. This sounds like a nerve compression and not a side effect of fenofibrate - use eye drops as needed, avoid cutting onions unless you wear protective eye-wear with glasses or mask  Pap smear today - will get results at next follow-up visit  Please schedule a follow-up appointment with Dr. Parks Ranger in 2 to 3 weeks to follow-up lab results  If you have any other questions or concerns, please feel free to call the clinic to contact me. You may also schedule an earlier appointment if necessary.  However, if your symptoms get significantly worse, please go to the Emergency Department to seek immediate medical attention.  Nobie Putnam, [place]

## 2015-05-12 NOTE — Progress Notes (Signed)
Subjective:    Patient ID: Nichole Wilson, female    DOB: August 25, 1960, 54 y.o.   MRN: [id number]  Nichole Wilson is a 54 y.o. female presenting on 05/12/2015 for Annual Exam  HPI  Recently went back to [place] to visit family in September 2016, had blood work done and brought copies of this, wanted to review labs.  CHRONIC HTN: Reports - takes BP at home regularly every other week, about 110s to 120 over 80s. Current Meds - HCTZ 12.43m daily Reports good compliance, did not take meds today. Tolerating well, w/o complaints. Denies CP, dyspnea, HA, edema, dizziness / lightheadedness  HYPERTRIGLYCERIDEMIA / HLD: - Reported she stopped taking Fenofibrate in 03/2015 after taking it for several years.  - Resumed Fish oil about 1 mo ago - Symptoms in right hand 4th 5th digits (end of July) with numbness, pain, seems to be worse, also reported rash in bilateral lower legs lasted 2-3 weeks, then resolved ([place] suspected to be heat rash was outdoors, resolved hydrocortisone - walking but no regular exercise, trying to avoid greasy foods, eating oatmeal with green apple regularly  Pre-DIABETES / OVERWEIGHT - Last A1c 5.8 in 2015, [place] 6.3 (from 03/2015) - Weight up 149 to 152 lbs in about 1 year - Remains active but no regular exercise  EYE IRRITATION / ALLERGIC CONJUNCTIVITIS: - Recent history of allergic eye reaction to onion exposure when preparing for food, has not been this severe before, no actual contact with eyes but did have some redness and irritation, since improving. - Taking OTC anti-histamine eye drops with improvement - No loss of vision, or pain, discharge  CERVICAL CANCER SCREENING 12/2011 - negative, no HPV testing - requested pap smear today - No symptoms  Social History: - Works as CFurniture conservator/restoreras SAdministrator, sportsat [employer]  HM: - Flu shot 04/08/15 received - Due for HIV and Hep C screening   Past  Medical History  Diagnosis Date  . Hypertriglyceridemia   . Hepatic steatosis   . Breast cyst, left 01-04-12    cyst no longer apparent  . Migraine without aura, without mention of intractable migraine without mention of status migrainosus   . Hypercholesterolemia with hypertriglyceridemia   . Cholelithiasis with choledocholithiasis   . Glucose intolerance (impaired glucose tolerance)     since at least 2011.   . Gallstone pancreatitis   . Hypertension 01-04-12    med d/c-last taken 12-07-11  -[place] BECAUSE HER B/P TOO LOW AND PT STATES HER B/P'S HAVE BEEN OK SINCE.  .Marland KitchenComplication of anesthesia 2002    PT STATES DURING HER C-SECTION SHE WAS AWARE OF INCISION AND PAIN -THEN WAS QUICKLY "PUT OUT"  . Chronic calculus cholecystitis 12/25/2011    Lap chole on 03/25/12    Social History   Social History  . Marital Status: Married    Spouse Name: N/A  . Number of Children: 2  . Years of Education: N/A   Occupational History  . translator [employer]   translates spanish to eFlorienfor pt.s at [employer]    Social History Main Topics  . Smoking status: Never Smoker   . Smokeless tobacco: Never Used  . Alcohol Use: No  . Drug Use: No  . Sexual Activity: Yes   Other Topics Concern  . Not on file   Social History Narrative   No caffein  Family History  Problem Relation Age of Onset  . Diabetes Mother   . Diabetes Father   . Diabetes Brother   . Colon cancer Neg Hx    Current Outpatient Prescriptions on File Prior to Visit  Medication Sig  . hydrochlorothiazide (HYDRODIURIL) 12.5 MG tablet Take 1 tablet (12.5 mg total) by mouth daily.  . Multiple Vitamin (MULTIVITAMIN WITH MINERALS) TABS Take 1 tablet by mouth daily.  [name] omega-3 acid ethyl esters (LOVAZA) 1 G capsule Take 2 capsules (2 g total) by mouth 2 (two) times daily.  . vitamin C (ASCORBIC ACID) 500 MG tablet Take 500 mg by mouth daily.  . vitamin E 1000 UNIT capsule  Take 1,000 Units by mouth daily.  . fenofibrate 160 MG tablet Take 1 tablet (160 mg total) by mouth daily. (Patient not taking: Reported on 05/12/2015)   No current facility-administered medications on file prior to visit.    Review of Systems  Constitutional: Negative for fever, chills, diaphoresis, activity change, appetite change and fatigue.  Eyes: Negative for visual disturbance.  Respiratory: Negative for cough, shortness of breath and wheezing.   Cardiovascular: Negative for chest pain, palpitations and leg swelling.  Gastrointestinal: Negative for nausea, vomiting, abdominal pain, diarrhea and constipation.  Endocrine: Negative for polydipsia, polyphagia and polyuria.  Musculoskeletal: Positive for arthralgias (hands, R>L 4th and 5th digits). Negative for neck pain.  Skin: Negative for rash.  Neurological: Positive for numbness (right 4th and 5th fingers intermittently and some fingertips). Negative for dizziness, weakness, light-headedness and headaches.   Per HPI unless specifically indicated above     Objective:    BP 139/87 mmHg  Pulse 94  Temp(Src) 98.2 F (36.8 C) (Oral)  Ht 5' 2"  (1.575 m)  Wt 152 lb (68.947 kg)  BMI 27.79 kg/m2  LMP 11/29/2011  Wt Readings from Last 3 Encounters:  05/12/15 152 lb (68.947 kg)  03/20/14 149 lb (67.586 kg)  03/06/14 149 lb 9.6 oz (67.858 kg)    Physical Exam  Constitutional: She is oriented to person, place, and time. She appears well-developed and well-nourished. No distress.  HENT:  Head: Normocephalic and atraumatic.  Mouth/Throat: Oropharynx is clear and moist.  Eyes: EOM are normal. Pupils are equal, round, and reactive to light. Right eye exhibits no discharge. Left eye exhibits no discharge. No scleral icterus.  Minimal conjunctival injection Right medial eye compared to left. Otherwise visual inspection normal.  Neck: Normal range of motion. Neck supple. No thyromegaly present.  Cardiovascular: Normal rate, regular  rhythm, normal heart sounds and intact distal pulses.   No murmur heard. Pulmonary/Chest: Effort normal and breath sounds normal. No respiratory distress. She has no wheezes. She has no rales.  Abdominal: Soft. Bowel sounds are normal. She exhibits no distension and no mass. There is no tenderness.  No HSM.  Genitourinary:  Normal external female genitalia. Vaginal canal without lesions. Normal appearing cervix, without lesions or bleeding. Physiologic discharge on exam. Bimanual exam without masses or cervical motion tenderness. Pap smear specimen collected.  Chaperoned by Delray Alt, CMA  Musculoskeletal: Normal range of motion. She exhibits no edema or tenderness.  Bilateral hands with normal appearance, without nodules, erythema, atrophy. Grip strength 5/5 equal. Tinel's negative bilaterally at both median nerve and ulnar nerves.  Lymphadenopathy:    She has no cervical adenopathy.  Neurological: She is alert and oriented to person, place, and time.  Intact distal sensation to light touch bilateral hands and feet.  Skin: Skin is warm and dry. No rash  noted. She is not diaphoretic.  Nursing note and vitals reviewed.  Results for orders placed or performed in visit on 05/12/15  Comprehensive metabolic panel  Result Value Ref Range   Sodium 137 135 - 146 mmol/L   Potassium 4.3 3.5 - 5.3 mmol/L   Chloride 99 98 - 110 mmol/L   CO2 26 20 - 31 mmol/L   Glucose, Bld 119 (H) 65 - 99 mg/dL   BUN 13 7 - 25 mg/dL   Creat 0.52 0.50 - 1.05 mg/dL   Total Bilirubin 0.6 0.2 - 1.2 mg/dL   Alkaline Phosphatase 134 (H) 33 - 130 U/L   AST 22 10 - 35 U/L   ALT 38 (H) 6 - 29 U/L   Total Protein 7.8 6.1 - 8.1 g/dL   Albumin 4.4 3.6 - 5.1 g/dL   Calcium 10.0 8.6 - 10.4 mg/dL  LDL cholesterol, direct  Result Value Ref Range   Direct LDL 108 <130 mg/dL  HIV antibody  Result Value Ref Range   HIV 1&2 Ab, 4th Generation NONREACTIVE NONREACTIVE  Hepatitis C antibody  Result Value Ref Range   HCV  Ab NEGATIVE NEGATIVE      Assessment & Plan:   Problem List Items Addressed This Visit      Cardiovascular and Mediastinum   Essential hypertension, benign    Controlled but mildly elevated - did not take meds today Meds - HCTZ 35.3I No complications   Plan:  1. Continue current BP meds.  2. Check CMET - stable SCr at 0.52 (b/l 0.5 to 0.6) 3. Lifestyle Mods - Start regular exercise, Dec salt intake, inc K+ rich vegs 4. Monitor BP at home or at drug store occasionally.       Relevant Orders   Comprehensive metabolic panel (Completed)     Digestive   Hepatic steatosis    Known history of hepatic steatosis with [place] in 2013, consider NAFL vs NASH - Continued elevated lipids from recent labs in [place]  Plan: 1. Consider repeat Liver US to follow-up 2. Consider NAFL vs NASH, may need further diagnostic work-up, lab elevations with very mild LFTs and Alk Phos consistent with fatty liver and less likely fibrate induced      Relevant Orders   Comprehensive metabolic panel (Completed)     Other   Conjunctivitis, allergic    Trial on olopatadine drops, likely allergic / irritative conjunctivitis due to onion exposure - Advised avoid onion preparation or try wearing protective eyewear      Relevant Medications   olopatadine (PATANOL) 0.1 % ophthalmic solution   Health care maintenance - Primary   Relevant Orders   HIV antibody (Completed)   Hepatitis C antibody (Completed)   Cytology - PAP   Hypercholesterolemia with hypertriglyceridemia    Elevated lipids on recent labs from [place], previously with elevated total chol, TG primarily, with reported family history of similar hyperlipidemia. Additionally overweight and pre-DIABETES with limited exercise - Self discontinued fenofibrate because of some finger pain and numbness, however had been on for years prior, one episode of elbow pain as well  Plan: 1. Check fasting lipid panel - update, lab incorrectly ordered as direct LDL  - discussed with lab to call and add-on a Fasting Lipid Panel to previous blood draw to avoid re-collection - *UPDATE* lipid panel results confirm elevated total chol 298, significantly "severe range" elevated TG 744 (up from 488 last year) and normal HDL 49 2. LDL stable (108 on direct LDL), not on statin therapy  3. May continue to hold Fenofibrate for now until lipid results and follow-up apt, see if finger pain improves, although unlikely to be exact cause, may have ulnar nerve entrapment or carpal tunnel  UPDATE - follow-up visit plan to start combo therapy with statin + fenofibrate. Switch to Fenofibrate 155m daily for 1-2 weeks, then will add Pravastatin 446mdaily (mod intensity), reduced incidence of muscle aches with pravastatin when combined with fibrate, vs other statins and gemfibrozil. Continue Lovaza total 4g daily. Need to implement regular lifestyle changes as well. Consider screening 1st degree family members. At inc risk CV events, progression to diabetes.      Overweight (BMI 25.0-29.9)   Pre-diabetes    At risk for advancing to diabetes with overweight and HLD, [place] 5.8 [place] 6.[place] Plan to follow-up A1c q 3 to 6 mo - Lifestyle modifications       Other Visit Diagnoses    Need for hepatitis C screening test        Relevant Orders    Hepatitis C antibody (Completed)    Screening for HIV (human immunodeficiency virus)        Relevant Orders    HIV antibody (Completed)    Cervical cancer screening        Relevant Orders    Cytology - PAP       Meds ordered this encounter  Medications  . olopatadine (PATANOL) 0.1 % ophthalmic solution    Sig: Place 1 drop into both eyes 2 (two) times daily as needed for allergies.    Dispense:  5 mL    Refill:  2      Follow up plan: Return in about 3 weeks (around 06/02/2015) for lab results.  AlNobie PutnamDOMartorellPGY-3

## 2015-05-14 LAB — LIPID PANEL
CHOLESTEROL: 298 mg/dL — AB (ref 125–200)
HDL: 47 mg/dL (ref >=46)
Total CHOL/HDL Ratio: 6.3 Ratio — ABNORMAL HIGH (ref <=5.0)
Triglycerides: 744 mg/dL — ABNORMAL HIGH (ref <150)

## 2015-05-14 NOTE — Assessment & Plan Note (Addendum)
Known history of hepatic steatosis with [place] in 2013, consider NAFL vs NASH - Continued elevated lipids from recent labs in [place]  Plan: 1. Consider repeat Liver US to follow-up 2. Consider NAFL vs NASH, may need further diagnostic work-up, lab elevations with very mild LFTs and Alk Phos consistent with fatty liver and less likely fibrate induced

## 2015-05-14 NOTE — Assessment & Plan Note (Addendum)
Elevated lipids on recent labs from [place], previously with elevated total chol, TG primarily, with reported family history of similar hyperlipidemia. Additionally overweight and pre-DIABETES with limited exercise - Self discontinued fenofibrate because of some finger pain and numbness, however had been on for years prior, one episode of elbow pain as well  Plan: 1. Check fasting lipid panel - update, lab incorrectly ordered as direct LDL - discussed with lab to call and add-on a Fasting Lipid Panel to previous blood draw to avoid re-collection - *UPDATE* lipid panel results confirm elevated total chol 298, significantly "severe range" elevated TG 744 (up from 488 last year) and normal HDL 49 2. LDL stable (108 on direct LDL), not on statin therapy 3. May continue to hold Fenofibrate for now until lipid results and follow-up apt, see if finger pain improves, although unlikely to be exact cause, may have ulnar nerve entrapment or carpal tunnel  UPDATE - follow-up visit plan to start combo therapy with statin + fenofibrate. Switch to Fenofibrate 130mg  daily for 1-2 weeks, then will add Pravastatin 40mg  daily (mod intensity), reduced incidence of muscle aches with pravastatin when combined with fibrate, vs other statins and gemfibrozil. Continue Lovaza total 4g daily. Need to implement regular lifestyle changes as well. Consider screening 1st degree family members. At inc risk CV events, progression to diabetes.

## 2015-05-14 NOTE — Assessment & Plan Note (Signed)
Controlled but mildly elevated - did not take meds today Meds - HCTZ 97.5P No complications   Plan:  1. Continue current BP meds.  2. Check CMET - stable SCr at 0.52 (b/l 0.5 to 0.6) 3. Lifestyle Mods - Start regular exercise, Dec salt intake, inc K+ rich vegs 4. Monitor BP at home or at drug store occasionally.

## 2015-05-15 NOTE — Assessment & Plan Note (Signed)
At risk for advancing to diabetes with overweight and HLD, rising A1c 5.8 to 6.3 (recently in [place]) - Plan to follow-up A1c q 3 to 6 mo - Lifestyle modifications

## 2015-05-15 NOTE — Assessment & Plan Note (Signed)
Trial on olopatadine drops, likely allergic / irritative conjunctivitis due to onion exposure - Advised avoid onion preparation or try wearing protective eyewear

## 2015-05-18 LAB — CYTOLOGY - PAP

## 2015-05-25 ENCOUNTER — Telehealth: Payer: Self-pay | Admitting: *Deleted

## 2015-05-25 NOTE — Telephone Encounter (Signed)
Pt says her eye isn't getting better wants to know if you can call her in another eye drop. Deseree Kennon Holter, CMA

## 2015-05-26 NOTE — Telephone Encounter (Signed)
Attempted to call patient back to discuss her concerns. Previously diagnosis was unclear seemed to be mild irritation with some localized redness following "chopping onions". Trial on Olopatadine 0.1% 1 drop in each eye up to 2 times daily without reported relief.  I called and left a voice mail for her to call us back to provide more history.  If she calls back, please either direct her to me (let me know when I can reach her, as I am covering hospital I may not be able to respond right away) 1. If it is just the right eye still or if both eyes are bothering her. 2. What symptoms - itching, watering or discharge, redness (if red is it worsening) 3. Did the patanol eye drops help at all?  If some relief but not enough, we can send a new rx of a stronger formulation of the same eye drop, Pazeo 0.7% to be used only ONCE daily. Otherwise she may try a Lubricating Eye Drop (over the counter, such as Systane).  Nobie Putnam, [place], PGY-3

## 2015-05-26 NOTE — Addendum Note (Signed)
Addended by: Olin Hauser on: 05/26/2015 09:43 AM   Modules accepted: Orders

## 2015-05-27 NOTE — Telephone Encounter (Signed)
Spoke with patient, she states she visited the opthalmologist yesterday and was prescribed an eye drop with steroids. States she was still having pain and redness in the right eye. Patient is scheduled to follow up with PCP on 11/16. FYI to MD.

## 2015-06-02 ENCOUNTER — Ambulatory Visit (INDEPENDENT_AMBULATORY_CARE_PROVIDER_SITE_OTHER): Payer: 59 | Admitting: Family Medicine

## 2015-06-02 ENCOUNTER — Encounter: Payer: Self-pay | Admitting: Family Medicine

## 2015-06-02 VITALS — BP 126/84 | HR 98 | Temp 97.7°F | Ht 62.0 in | Wt 153.9 lb

## 2015-06-02 DIAGNOSIS — K760 Fatty (change of) liver, not elsewhere classified: Secondary | ICD-10-CM

## 2015-06-02 DIAGNOSIS — I10 Essential (primary) hypertension: Secondary | ICD-10-CM

## 2015-06-02 DIAGNOSIS — H15013 Anterior scleritis, bilateral: Secondary | ICD-10-CM

## 2015-06-02 DIAGNOSIS — E782 Mixed hyperlipidemia: Secondary | ICD-10-CM | POA: Diagnosis not present

## 2015-06-02 LAB — IRON: IRON: 67 ug/dL (ref 45–160)

## 2015-06-02 LAB — FERRITIN: Ferritin: 119 ng/mL (ref 10–291)

## 2015-06-02 LAB — POCT SEDIMENTATION RATE: POCT SED RATE: 21 mm/h (ref 0–22)

## 2015-06-02 MED ORDER — PRAVASTATIN SODIUM 40 MG PO TABS: 20.0000 mg | ORAL_TABLET | Freq: Every day | ORAL | Status: DC

## 2015-06-02 NOTE — Patient Instructions (Signed)
Thank you for coming in to clinic today.  1. Reviewed all lab results - printed copies 2. For your elevated cholesterol - Triglycerides, start Pravastatin 40mg tablets - cut in half (take 20ming) half tablet nightly for 2 to 3 weeks, if tolerating it well then you can take a whole tablet. 3. HOLD Fenofibrate for now. 4. Keep taking Fish Oil 5. Stay active 6. Checked other blood tests - ANA, ESR, FTA-Ab for Dr Perez  Requested records after next visit.  Keep taking eye drops and follow-up with Dr Perez  Please schedule a follow-up appointment with Dr. Karamalegos in 6 to 8 weeks to follow-up Cholesterol.  BEFORE THIS VISIT: (so we have results for next visit, do this a few days before)  - Please schedule a "Lab Only" visit (early morning, 8 to 9:00am) to get your blood work drawn here at our clinic. - You need to be fasting (No food or drink after midnight, and nothing in the morning before your blood draw). - I have already ordered your blood work (orders will be good for few months), so you may schedule this appointment at your convenience. - We can discuss results at next appointment, otherwise our office will contact you with results by phone or with a letter  If you have any other questions or concerns, please feel free to call the clinic to contact me. You may also schedule an earlier appointment if necessary.  However, if your symptoms get significantly worse, please go to the Emergency Department to seek immediate medical attention.  Alexander Karamalegos, DO  Family Medicine    

## 2015-06-02 NOTE — Progress Notes (Signed)
Subjective:    Patient ID: Nichole Wilson, female    DOB: [birth year], 54 y.o.   MRN: [id number]  Nichole Wilson is a 54 y.o. female presenting on 06/02/2015 for Follow-up  HPI  Reviewed all lab results from last office visit 05/12/15, and printed copies.  HYPERTRIGLYCERIDEMIA / HLD: - Follow-up today to review recent lab results with fasting lipid panel. Known chronic history of HyperTG, with reported significant family history of hypercholesterolemia. - Previously treated with Fenofibrate 156m daily for "several years" tolerated without problem, however several months ago 8 to 03/2015, developed right hand pain with some numbness, and leg pain, she stopped the fenofibrate and symptoms improved over several weeks. She was concerned that these symptoms were related. Last visit we discussed that this was unlikely, but held fenofibrate. - Reviewed results of fasting lipid panel today, discussed concern with severe hypertriglyceridemia. She reports continues to take F[place]ama tries to avoid fried, greasy or fatty foods, some walking not regularly still (busy with work)  CHRONIC HTN: Reports no concerns today. BP stable outside office. Current Meds - HCTZ 12.556mdaily Reports good compliance, did take meds today. Tolerating well, w/o complaints. Denies CP, dyspnea, HA, edema, dizziness / lightheadedness  ANTERIOR SCLERITIS, per ophthalmology (Improved) - Last visit with me 10/26 with suspected allergic or contact reaction conjunctivitis from preparing onions, bilateral eyes. She had tearing and mild redness, itching. Treated with olopatadine eye drops without significant relief. She followed up with eye doctor, ([facility] [email or web address] 05[facility]27/15, treated  with Predniosolone 1% eye drops both eyes, first day every 2 hours, and then tapered to q 4 hours. Next follow-up on Friday 11/18. She was asked to request labwork from PCP. I called their office  to confirm (request ANA, ESR, FTA-Abs) - Redness, irritation, watering improved - Denies eye pain or discharge, loss of vision - No rash or painful joints, swelling  Past Medical History  Diagnosis Date  . Hypertriglyceridemia   . Hepatic steatosis   . Breast cyst, left 01-04-12    cyst no longer apparent  . Migraine without aura, without mention of intractable migraine without mention of status migrainosus   . Hypercholesterolemia with hypertriglyceridemia   . Cholelithiasis with choledocholithiasis   . Glucose intolerance (impaired glucose tolerance)     since at least 2011.   . Gallstone pancreatitis   . Hypertension 01-04-12    med d/c-last taken 12-07-11  -PT [facility]TOPPED HER B/P MEDICATION BECAUSE HER B/P TOO LOW AND PT STATES HER B/P'S HAVE BEEN OK SINCE.  . Marland Kitchenomplication of anesthesia 2002    PT STATES DURING HER C-SECTION SHE WAS AWARE OF INCISION AND PAIN -THEN WAS QUICKLY "PUT OUT"  . Chronic calculus cholecystitis 12/25/2011    Lap chole on 03/25/12    Social History   Social History  . Marital Status: Married    Spouse Name: N/A  . Number of Children: 2  . Years of Education: N/A   Occupational History  . 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 Smoking status: Never Smoker   . Smokeless tobacco: Never Used  . Alcohol Use: No  . Drug Use: No  . Sexual Activity: Yes   Other Topics Concern  . Not on file   Social History Narrative   No caffein   Family History  Problem  Relation Age of Onset  . Diabetes Mother   . Diabetes Father   . Diabetes Brother   . Colon cancer Neg Hx    Current Outpatient Prescriptions on File Prior to Visit  Medication Sig  . hydrochlorothiazide (HYDRODIURIL) 12.5 MG tablet Take 1 tablet (12.5 mg total) by mouth daily.  . Multiple Vitamin (MULTIVITAMIN WITH MINERALS) TABS Take 1 tablet by mouth daily.  [name] omega-3 acid ethyl esters  (LOVAZA) 1 G capsule Take 2 capsules (2 g total) by mouth 2 (two) times daily.  . vitamin C (ASCORBIC ACID) 500 MG tablet Take 500 mg by mouth daily.  . vitamin E 1000 UNIT capsule Take 1,000 Units by mouth daily.  . fenofibrate 160 MG tablet Take 1 tablet (160 mg total) by mouth daily. (Patient not taking: Reported on 05/12/2015)   No current facility-administered medications on file prior to visit.    Review of Systems  Constitutional: Negative for fever, diaphoresis, activity change, appetite change and fatigue.  Eyes: Negative for photophobia, pain, discharge, redness (resolved), itching (resolved) and visual disturbance.  Respiratory: Negative for cough and shortness of breath.   Cardiovascular: Negative for chest pain and leg swelling.  Gastrointestinal: Negative for nausea, vomiting and abdominal pain.  Endocrine: Negative for polydipsia, polyphagia and polyuria.  Musculoskeletal: Negative for back pain, joint swelling, arthralgias and neck pain.  Skin: Negative for rash.  Neurological: Negative for dizziness, weakness and headaches.   Per HPI unless specifically indicated above     Objective:    BP 126/84 mmHg  Pulse 98  Temp(Src) 97.7 F (36.5 C) (Oral)  Ht 5' 2" (1.575 m)  Wt 153 lb 14.4 oz (69.809 kg)  BMI 28.14 kg/m2  LMP 11/29/2011  Wt Readings from Last 3 Encounters:  06/02/15 153 lb 14.4 oz (69.809 kg)  05/12/15 152 lb (68.947 kg)  03/20/14 149 lb (67.586 kg)    Physical Exam  Constitutional: She is oriented to person, place, and time. She appears well-developed and well-nourished. No distress.  HENT:  Head: Normocephalic and atraumatic.  Mouth/Throat: Oropharynx is clear and moist.  Eyes: Conjunctivae and EOM are normal. Pupils are equal, round, and reactive to light. Right eye exhibits no discharge. Left eye exhibits no discharge. No scleral icterus.  Resolved conjunctival injection from last visit. Clear sclera.  Musculoskeletal: She exhibits no edema.    Neurological: She is alert and oriented to person, place, and time.  Skin: Skin is warm and dry. No rash noted. She is not diaphoretic.  Nursing note and vitals reviewed.  Results for orders placed or performed in visit on 06/02/15  ANA  Result Value Ref Range   Anit Nuclear Antibody(ANA) NEG NEGATIVE  Ferritin  Result Value Ref Range   Ferritin 119 10 - 291 ng/mL  Iron  Result Value Ref Range   Iron 67 45 - 160 ug/dL  RPR  Result Value Ref Range   RPR Ser Ql  NON REAC  POCT SEDIMENTATION RATE  Result Value Ref Range   POCT SED RATE 21 0 - 22 mm/hr      Assessment & Plan:   Problem List Items Addressed This Visit      Cardiovascular and Mediastinum   Essential hypertension, benign    Improved BP today  Meds - HCTZ 96.7 No complications   Plan:  1. Continue current BP meds.  2. Lifestyle Mods - Start regular exercise, Dec salt intake, inc K+ rich vegs 3. Monitor BP at home or at drug store occasionally.  Relevant Medications   pravastatin (PRAVACHOL) 40 MG tablet     Digestive   Hepatic steatosis    HyperTG with known history of hepatic steatosis on [place] 3 years ago. No abdominal pain or concerns on CMET, with normal AST, mild elevated ALT 38, and alk phos 134 (but s/p cholecystectomy).  Plan: 1. Discussed monitor LFTs in future q 6 to 12, and consider add GGT for elevated alk phos 2. Check ferritin, iron - normal levels 3. Treat underlying hyperTG, in future consider additional work-up vs referral hepatology      Relevant Orders   Ferritin (Completed)   Iron (Completed)     Other   Anterior scleritis of both eyes    Now diagnosed with b/l anterior scleritis per Ophtho (Dr Sharen Counter), treated with prednissolone eye drops - Clinically appears resolved today  Plan: 1. Continue prednisolone eye drops q 4 hr per ophtho, follow-up as scheduled on 11/18 2. Spoke with Dr Sharen Counter office about requested labs - work-up etiology, requested ANA, ESR, FTA-Ab, these  labs were ordered by patient request (note FTA changed to RPR) 3. Will attempt to notify patient of lab results once available      Relevant Orders   ANA (Completed)   POCT SEDIMENTATION RATE (Completed)   Hypercholesterolemia with hypertriglyceridemia - Primary    Elevated fasting HyperTG 744 in severe range. Risk factors with metabolic syndrome, pre-DM, concern for affecting CVD risk, however at this time ASCVD 10 yr risk < 5%.  Plan: 1. Start Pravastatin 20m - take half tab 251mat night for up to 2 weeks if tolerating, inc to 4033m2. Hold fenofibrate until re-check lipids 3. Ordered future fasting lipid panel - to be drawn lab only apt prior to next apt to discuss results 4. Anticipate add Fenofibrate at low dose and titrate up 5. Continue Lovaza fish oil 6. Will need to start regular exercise with medical therapy 7. Check A1c at next visit for Pre-DM      Relevant Medications   pravastatin (PRAVACHOL) 40 MG tablet   Other Relevant Orders   Lipid panel      Meds ordered this encounter  Medications  . prednisoLONE acetate (PRED FORTE) 1 % ophthalmic suspension    Sig:     Refill:  0  . olopatadine (PATANOL) 0.1 % ophthalmic solution    Sig:     Refill:  2  . pravastatin (PRAVACHOL) 40 MG tablet    Sig: Take 0.5 tablets (20 mg total) by mouth at bedtime.    Dispense:  30 tablet    Refill:  0      Follow up plan: No Follow-up on file.  AleNobie PutnamO [place]-3

## 2015-06-03 LAB — ANA: Anti Nuclear Antibody(ANA): NEGATIVE

## 2015-06-04 ENCOUNTER — Encounter: Payer: Self-pay | Admitting: Family Medicine

## 2015-06-04 LAB — RPR

## 2015-06-04 NOTE — Assessment & Plan Note (Signed)
HyperTG with known history of hepatic steatosis on [place] 3 years ago. No abdominal pain or concerns on CMET, with normal AST, mild elevated ALT 38, and alk phos 134 (but s/p cholecystectomy).  Plan: 1. Discussed monitor LFTs in future q 6 to 12, and consider add GGT for elevated alk phos 2. Check ferritin, iron - normal levels 3. Treat underlying hyperTG, in future consider additional work-up vs referral hepatology

## 2015-06-04 NOTE — Assessment & Plan Note (Addendum)
Now diagnosed with b/l anterior scleritis per Ophtho (Dr Sharen Counter), treated with prednissolone eye drops - Clinically appears resolved today  Plan: 1. Continue prednisolone eye drops q 4 hr per ophtho, follow-up as scheduled on 11/18 2. Spoke with Dr Sharen Counter office about requested labs - work-up etiology, requested ANA, ESR, FTA-Ab, these labs were ordered by patient request (note FTA changed to RPR) 3. Will attempt to notify patient of lab results once available

## 2015-06-04 NOTE — Assessment & Plan Note (Signed)
Improved BP today  Meds - HCTZ [id number] No complications   Plan:  1. Continue current BP meds.  2. Lifestyle Mods - Start regular exercise, Dec salt intake, inc K+ rich vegs 3. Monitor BP at home or at drug store occasionally.

## 2015-06-04 NOTE — Assessment & Plan Note (Addendum)
Elevated fasting HyperTG 744 in severe range. Risk factors with metabolic syndrome, pre-DM, concern for affecting CVD risk, however at this time ASCVD 10 yr risk < 5%.  Plan: 1. Start Pravastatin 40mg  - take half tab 20mg  at night for up to 2 weeks if tolerating, inc to 40mg . 2. Hold fenofibrate until re-check lipids 3. Ordered future fasting lipid panel - to be drawn lab only apt prior to next apt to discuss results 4. Anticipate add Fenofibrate at low dose and titrate up 5. Continue Lovaza fish oil 6. Will need to start regular exercise with medical therapy 7. Check A1c at next visit for Pre-DM

## 2015-06-09 ENCOUNTER — Other Ambulatory Visit: Payer: 59

## 2015-06-14 ENCOUNTER — Other Ambulatory Visit: Payer: Self-pay

## 2015-06-14 DIAGNOSIS — Z1231 Encounter for screening mammogram for malignant neoplasm of breast: Secondary | ICD-10-CM

## 2015-07-16 ENCOUNTER — Ambulatory Visit
Admission: RE | Admit: 2015-07-16 | Discharge: 2015-07-16 | Disposition: A | Discharge status: Home / Self Care | Payer: 59 | Source: Ambulatory Visit

## 2015-07-16 DIAGNOSIS — Z1231 Encounter for screening mammogram for malignant neoplasm of breast: Secondary | ICD-10-CM

## 2015-07-18 DIAGNOSIS — C50919 Malignant neoplasm of unspecified site of unspecified female breast: Secondary | ICD-10-CM

## 2015-07-18 HISTORY — DX: Malignant neoplasm of unspecified site of unspecified female breast: C50.919

## 2015-07-21 ENCOUNTER — Other Ambulatory Visit: Payer: Self-pay | Admitting: Family Medicine

## 2015-07-21 ENCOUNTER — Ambulatory Visit: Payer: 59 | Admitting: Family Medicine

## 2015-07-21 DIAGNOSIS — R928 Other abnormal and inconclusive findings on diagnostic imaging of breast: Secondary | ICD-10-CM

## 2015-07-22 ENCOUNTER — Ambulatory Visit (INDEPENDENT_AMBULATORY_CARE_PROVIDER_SITE_OTHER): Payer: 59 | Admitting: Family Medicine

## 2015-07-22 ENCOUNTER — Encounter: Payer: Self-pay | Admitting: Family Medicine

## 2015-07-22 VITALS — BP 131/87 | HR 102 | Temp 98.7°F | Ht 62.0 in | Wt 149.0 lb

## 2015-07-22 DIAGNOSIS — R5382 Chronic fatigue, unspecified: Secondary | ICD-10-CM | POA: Diagnosis not present

## 2015-07-22 DIAGNOSIS — R5383 Other fatigue: Secondary | ICD-10-CM | POA: Insufficient documentation

## 2015-07-22 DIAGNOSIS — E663 Overweight: Secondary | ICD-10-CM | POA: Diagnosis not present

## 2015-07-22 DIAGNOSIS — E782 Mixed hyperlipidemia: Secondary | ICD-10-CM | POA: Diagnosis not present

## 2015-07-22 DIAGNOSIS — R7303 Prediabetes: Secondary | ICD-10-CM

## 2015-07-22 HISTORY — DX: Other fatigue: R53.83

## 2015-07-22 MED ORDER — ICOSAPENT ETHYL 1 G PO CAPS: 2.0000 g | ORAL_CAPSULE | Freq: Two times a day (BID) | ORAL | Status: DC

## 2015-07-22 MED FILL — VASCEPA 1 GM CAPSULE: 1 | 30 days supply | Qty: 120 | Fill #0

## 2015-07-22 NOTE — Patient Instructions (Addendum)
Thank you for coming in to clinic today.  Keep taking Pravastatin 40mg  daily. Switched to [employer] 2g (two of the 1g capsules twice daily with meals), stop taking Lovaza  Ordered blood work  Please schedule a follow-up appointment with Dr. Parks Ranger in 1 to 3 months to follow-up Fatigue, Cholesterol  BEFORE THIS VISIT: (so we have results for next visit, do this a few days before)  - Please schedule a "Lab Only" visit (early morning, 8 to 9:00am) to get your blood work drawn here at our clinic. - You need to be fasting (No food or drink after midnight, and nothing in the morning before your blood draw). - I have already ordered your blood work (orders will be good for few months), so you may schedule this appointment at your convenience. - We can discuss results at next appointment, otherwise our office will contact you with results by phone or with a letter  If you have any other questions or concerns, please feel free to call the clinic to contact me. You may also schedule an earlier appointment if necessary.  However, if your symptoms get significantly worse, please go to the Emergency Department to seek immediate medical attention.  Nobie Putnam, [place]

## 2015-07-22 NOTE — Assessment & Plan Note (Signed)
Generalized chronic fatigue, with overweight and Pre-DM, now patient's concern with neck swelling, possible hypothyroidism (however no thyromegaly on exam). Post-menopausal and no further bleeding and no history of anemia.  Plan: 1. CHeck TSH, CBC

## 2015-07-22 NOTE — Assessment & Plan Note (Signed)
Suspected familial hypercholesterolemia with elevated HyperTG in severe range and strong family. Risk factors with metabolic syndrome, pre-DM, overweight. - Tolerating statin  Plan: 1. Continue Pravastatin 40mg  daily 2. Remain off fenofibrate until check lipid panel, then will counsel to resume fenofibrate back at lower dose (<160) then titrate up as tolerated, caution with myalgias 3. Switch omega 3 Lovaza to Vascepa 2g BID with meals, by patient request (same med as her brother) 4. Ordered future fasting lipid panel - to be drawn lab only apt prior to next apt to discuss results 5. Improve regular exercise, diet 6. Follow-up 2-3 months

## 2015-07-22 NOTE — Assessment & Plan Note (Addendum)
Weight down 4 lbs in 1 month - Improve exercise, dietary changes - Check TSH and A1c with PreDM

## 2015-07-22 NOTE — Assessment & Plan Note (Signed)
Check future A1c

## 2015-07-22 NOTE — Progress Notes (Signed)
Subjective:    Patient ID: Nichole Wilson, female    DOB: 09-03-1960, 55 y.o.   MRN: [id number]  Nichole Wilson is a 55 y.o. female presenting on 07/22/2015 for Follow-up and knot on throat  HPI  HYPERTRIGLYCERIDEMIA / HLD: - Known chronic history of HyperTG, with reported significant family history of hypercholesterolemia (brother). Last lipid panel and direct LDL 04/2015. Last visit 06/02/15, started pravastatin 40mg  daily, was holding fenofibrate with possible myalgias with hand and leg pain. Continued on Lovaza fish oil. She did not obtain fasting lipid panel since that visit, and no new results to review today - She reports tolerating statin well overall, did have few occasional days with questionable muscle ache but attributed to other factors. Questions if new med Vascepa would be good for her, omega 3 different from her Lovaza brand, her brother takes this and wanted her to ask - History of Pre-DM, Overweight with wt loss in 1 month  FATIGUE, GENERALIZED: - She reports some chronic fatigue, intermittent worsening, continues to work and not limiting her function. Also admits to occasional night sweat. Down 4 lbs in 1 month but not unintentional. Does also raise question of "small area of swelling or a knot" at base of neck, concerned about thyroid.  Past Medical History  Diagnosis Date  . Hypertriglyceridemia   . Hepatic steatosis   . Breast cyst, left 01-04-12    cyst no longer apparent  . Migraine without aura, without mention of intractable migraine without mention of status migrainosus   . Hypercholesterolemia with hypertriglyceridemia   . Cholelithiasis with choledocholithiasis   . Glucose intolerance (impaired glucose tolerance)     since at least 2011.   . Gallstone pancreatitis   . Hypertension 01-04-12    med d/c-last taken 12-07-11  -PT [place] STOPPED HER B/P MEDICATION BECAUSE HER B/P TOO LOW AND PT STATES HER B/P'S HAVE BEEN OK SINCE.  [name] Complication of  anesthesia 2002    PT STATES DURING HER C-SECTION SHE WAS AWARE OF INCISION AND PAIN -THEN WAS QUICKLY "PUT OUT"  . Chronic calculus cholecystitis 12/25/2011    Lap chole on 03/25/12    Social History   Social History  . Marital Status: Married    Spouse Name: N/A  . Number of Children: 2  . Years of Education: N/A   Occupational History  . 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    Social History Main Topics  . Smoking status: Never Smoker   . Smokeless tobacco: Never Used  . Alcohol Use: No  . Drug Use: No  . Sexual Activity: Yes   Other Topics Concern  . Not on file   Social History Narrative   No caffein   Family History  Problem Relation Age of Onset  . Diabetes Mother   . Diabetes Father   . Diabetes Brother   . Colon cancer Neg Hx    Current Outpatient Prescriptions on File Prior to Visit  Medication Sig  . fenofibrate 160 MG tablet Take 1 tablet (160 mg total) by mouth daily. (Patient not taking: Reported on 05/12/2015)  . hydrochlorothiazide (HYDRODIURIL) 12.5 MG tablet Take 1 tablet (12.5 mg total) by mouth daily.  . Multiple Vitamin (MULTIVITAMIN WITH MINERALS) TABS Take 1 tablet by mouth daily.  [name] olopatadine (PATANOL) 0.1 % ophthalmic solution   . pravastatin (PRAVACHOL) 40 MG tablet Take 0.5 tablets (20 mg total) by mouth at bedtime.  [name]  prednisoLONE acetate (PRED FORTE) 1 % ophthalmic suspension   . vitamin C (ASCORBIC ACID) 500 MG tablet Take 500 mg by mouth daily.  . vitamin E 1000 UNIT capsule Take 1,000 Units by mouth daily.   No current facility-administered medications on file prior to visit.    Review of Systems Per HPI unless specifically indicated above     Objective:    BP 131/87 mmHg  Pulse 102  Temp(Src) 98.7 F (37.1 C) (Oral)  Ht 5\' 2"  (1.575 m)  Wt 149 lb (67.586 kg)  BMI 27.25 kg/m2  LMP 11/29/2011  Wt Readings from Last 3 Encounters:  07/22/15 149 lb (67.586 kg)  06/02/15 153 lb  14.4 oz (69.809 kg)  05/12/15 152 lb (68.947 kg)    Physical Exam  Constitutional: She appears well-developed and well-nourished. No distress.  Eyes: Conjunctivae are normal.  Neck: Normal range of motion. Neck supple. No thyromegaly present.  Minimal non-specific swelling at base of neck right side at [facility] joint, not consistent with palpable lymph node or any mass.  Cardiovascular: Normal rate, regular rhythm, normal heart sounds and intact distal pulses.   No murmur heard. Lymphadenopathy:    She has no cervical adenopathy.  Neurological: She is alert.  Skin: Skin is warm and dry. She is not diaphoretic.  Nursing note and vitals reviewed.  Results for orders placed or performed in visit on 06/02/15  ANA  Result Value Ref Range   Anit Nuclear Antibody(ANA) NEG NEGATIVE  Ferritin  Result Value Ref Range   Ferritin 119 10 - 291 ng/mL  Iron  Result Value Ref Range   Iron 67 45 - 160 ug/dL  RPR  Result Value Ref Range   RPR Ser Ql NON REAC NON REAC  POCT SEDIMENTATION RATE  Result Value Ref Range   POCT SED RATE 21 0 - 22 mm/hr      Assessment & Plan:   Problem List Items Addressed This Visit    Fatigue    Generalized chronic fatigue, with overweight and Pre-DM, now patient's concern with neck swelling, possible hypothyroidism (however no thyromegaly on exam). Post-menopausal and no further bleeding and no history of anemia.  Plan: 1. CHeck TSH, CBC      Relevant Orders   TSH   CBC   Hypercholesterolemia with hypertriglyceridemia - Primary    Suspected familial hypercholesterolemia with elevated HyperTG in severe range and strong family. Risk factors with metabolic syndrome, pre-DM, overweight. - Tolerating statin  Plan: 1. Continue Pravastatin 40mg  daily 2. Remain off fenofibrate until check lipid panel, then will counsel to resume fenofibrate back at lower dose (<160) then titrate up as tolerated, caution with myalgias 3. Switch omega 3 Lovaza to Vascepa 2g BID with  meals, by patient request (same med as her brother) 4. Ordered future fasting lipid panel - to be drawn lab only apt prior to next apt to discuss results 5. Improve regular exercise, diet 6. Follow-up 2-3 months      Relevant Medications   Icosapent Ethyl 1 g CAPS   Other Relevant Orders   Lipid panel   Overweight (BMI 25.0-29.9)    Weight down 4 lbs in 1 month - Improve exercise, dietary changes - Check TSH and A1c with PreDM      Relevant Orders   TSH   Lipid panel   POCT glycosylated hemoglobin (Hb A1C)   Pre-diabetes    Check future A1c      Relevant Orders   POCT glycosylated hemoglobin (Hb A1C)  Meds ordered this encounter  Medications  . Icosapent Ethyl 1 g CAPS    Sig: Take 2 g by mouth 2 (two) times daily with a meal.    Dispense:  120 capsule    Refill:  5      Follow up plan: Return in about 2 months (around 09/19/2015) for cholesterol, fatigue.  Nobie Putnam, [place], PGY-3

## 2015-07-23 ENCOUNTER — Other Ambulatory Visit: Payer: Self-pay | Admitting: Family Medicine

## 2015-07-23 ENCOUNTER — Telehealth: Payer: Self-pay | Admitting: Family Medicine

## 2015-07-23 DIAGNOSIS — R928 Other abnormal and inconclusive findings on diagnostic imaging of breast: Secondary | ICD-10-CM

## 2015-07-23 DIAGNOSIS — E782 Mixed hyperlipidemia: Secondary | ICD-10-CM

## 2015-07-23 NOTE — Telephone Encounter (Signed)
Patient called to let dr know that her Pravastatin was not sent in.

## 2015-07-26 ENCOUNTER — Other Ambulatory Visit: Payer: Self-pay | Admitting: Family Medicine

## 2015-07-26 ENCOUNTER — Ambulatory Visit
Admission: RE | Admit: 2015-07-26 | Discharge: 2015-07-26 | Disposition: A | Discharge status: Home / Self Care | Payer: 59 | Source: Ambulatory Visit | Attending: Family Medicine | Admitting: Family Medicine

## 2015-07-26 DIAGNOSIS — R928 Other abnormal and inconclusive findings on diagnostic imaging of breast: Secondary | ICD-10-CM

## 2015-07-26 DIAGNOSIS — R921 Mammographic calcification found on diagnostic imaging of breast: Secondary | ICD-10-CM | POA: Diagnosis not present

## 2015-07-26 DIAGNOSIS — N6453 Retraction of nipple: Secondary | ICD-10-CM | POA: Diagnosis not present

## 2015-07-26 MED ORDER — PRAVASTATIN SODIUM 40 MG PO TABS: 40.0000 mg | ORAL_TABLET | Freq: Every day | ORAL | Status: DC

## 2015-07-26 MED FILL — PRAVASTATIN NA 40 MG TAB: 40 | 30 days supply | Qty: 30 | Fill #0

## 2015-07-26 NOTE — Telephone Encounter (Signed)
Refilled Pravastatin at 1 tab 40mg  daily at bedtime, +11 refills for 1 year.  Nobie Putnam, [place], PGY-3

## 2015-07-27 ENCOUNTER — Telehealth: Payer: Self-pay | Admitting: *Deleted

## 2015-07-27 ENCOUNTER — Telehealth: Payer: Self-pay | Admitting: Family Medicine

## 2015-07-27 DIAGNOSIS — R921 Mammographic calcification found on diagnostic imaging of breast: Secondary | ICD-10-CM

## 2015-07-27 DIAGNOSIS — N6459 Other signs and symptoms in breast: Secondary | ICD-10-CM | POA: Insufficient documentation

## 2015-07-27 NOTE — Telephone Encounter (Signed)
MRI scheduled at [facility] for 1/24 at 1pm, patient aware.

## 2015-07-27 NOTE — Telephone Encounter (Signed)
Patient called stating she wanted to speak with Dr. Parks Ranger regarding MRI of the breast.  Patient stated that the Breast Center was going to send PCP a request for the MRI.  She is requesting to have it completed at [facility].  Please give her a call at [phone].  Derl Barrow, RN

## 2015-07-27 NOTE — Telephone Encounter (Signed)
Patient had screening mammogram on 07/16/15 with suspicious calcified lesions of Right breast associated with nipple inversion, proceeded per Breast Center / Radiology to obtain Right diagnostic mammogram with [place], demonstrates Bi-rads 4, still with suspicious calcified lesions Right breast. Indicating recommendation for Right breast stereotactic-guided biopsy for calcifications, scheduled 08/23/15 at 0800, additionally, Radiology recommended MRI for nipple inversion (no mass identified behind nipple on [place] but MRI would further evaluate area behind nipple). Paged by Radiologist Dr Enriqueta Shutter [phone]) on 07/27/15 approx 1100 to review these breast imaging results and plans to proceed with biopsy. I agreed with his recommendation for MRI. He requested that I place order for breast MRI.  Ordered future MR Breast Bilateral [place] Contrast, location [facility] [place]. Per Dr Enriqueta Shutter instructions, once future order is placed, Radiology will contact patient 07/28/15 to schedule MRI to be obtained prior to upcoming R-breast biopsy. No further actions done on my end, will await further results and follow-up with patient as planned.  Nobie Putnam, [place], PGY-3

## 2015-07-28 NOTE — Telephone Encounter (Signed)
Attempted to call patient back twice today 0900 and 1330, patient not answering. I left a voicemail message now at 1330, confirming that her imaging study has been ordered and scheduled, also that the Radiologist team would be contacting her to discuss her imaging before and afterwards to hopefully address any other questions. She may call back and ask any specific questions for me and I can try to call her again if needed. Otherwise, we will follow results of Bilateral Breast MRI on 08/10/15, which is important to obtain prior to upcoming scheduled R-breast stereotactic biopsy on 08/23/15. See my previous telephone note dated 07/27/15.  Nobie Putnam, [place], PGY-3

## 2015-07-30 ENCOUNTER — Other Ambulatory Visit (INDEPENDENT_AMBULATORY_CARE_PROVIDER_SITE_OTHER): Payer: 59

## 2015-07-30 DIAGNOSIS — E663 Overweight: Secondary | ICD-10-CM | POA: Diagnosis not present

## 2015-07-30 DIAGNOSIS — R5382 Chronic fatigue, unspecified: Secondary | ICD-10-CM | POA: Diagnosis not present

## 2015-07-30 DIAGNOSIS — R7303 Prediabetes: Secondary | ICD-10-CM | POA: Diagnosis not present

## 2015-07-30 LAB — CBC
HEMATOCRIT: 38.5 % (ref 36.0–46.0)
HEMOGLOBIN: 12.8 g/dL (ref 12.0–15.0)
MCH: 30.6 pg (ref 26.0–34.0)
MCHC: 33.2 g/dL (ref 30.0–36.0)
MCV: 92.1 fL (ref 78.0–100.0)
MPV: 10 fL (ref 8.6–12.4)
Platelets: 322 10*3/uL (ref 150–400)
RBC: 4.18 MIL/uL (ref 3.87–5.11)
RDW: 13.7 % (ref 11.5–15.5)
WBC: 4.7 10*3/uL (ref 4.0–10.5)

## 2015-07-30 LAB — POCT GLYCOSYLATED HEMOGLOBIN (HGB A1C): HEMOGLOBIN A1C: 6.3

## 2015-07-30 NOTE — Progress Notes (Signed)
CBC,TSH,A1C DONE TODAY. LIPID UNABLE TO BE DONE DUE TO BEING TO SOON. [place]

## 2015-07-31 LAB — TSH: TSH: 1.228 u[IU]/mL (ref 0.350–4.500)

## 2015-08-02 MED FILL — HYDROCHLOROTHIAZIDE 12.5 MG: 12.5 | 90 days supply | Qty: 90 | Fill #2

## 2015-08-03 ENCOUNTER — Telehealth: Payer: Self-pay | Admitting: Family Medicine

## 2015-08-03 DIAGNOSIS — R7303 Prediabetes: Secondary | ICD-10-CM

## 2015-08-03 MED ORDER — METFORMIN HCL 500 MG PO TABS: 500.0000 mg | ORAL_TABLET | Freq: Two times a day (BID) | ORAL | Status: DC

## 2015-08-03 MED FILL — metFORMIN HCL 500 MG TABS: 500 | 30 days supply | Qty: 60 | Fill #0

## 2015-08-03 NOTE — Telephone Encounter (Signed)
Last OV 07/22/15, had labs done and other interval problems since that visit, see list below.  1. HgbA1c - Increased to 6.3 from 5.8 (08/2013), never been on metformin, concern with obesity and hyperTG. Discussed with patient that she is near T2DM >6.5, and this will be a chronic problem unless seriously addressed. She has tried lifestyle modifications. Offered medication with metformin, and she would like to try this. Start Metformin 500mg  BID WC #60, +3 refills, sent to [place], start titrate only 500mg  nightly then go to BID after 1-2 weeks if tolerating from GI standpoint. Follow-up A1c q 3 months, next by 10/2015.  2. Fatigue - Checked CBC, results unremarkable. Hgb stable 12.8 previously had been as low as 11.5 to 12 within past 5 years, now seems normal. Additionally, TSH is normal 1.228.  3. HyperTG - Still currently only taking Pravastatin 40mg  nightly without problems, still holding Fenofibrate by patient request, given prior MSk symptoms, however these were more acute and not present for years on this monotherapy. I advised her that she likely needs to be back on Fibrate + Statin, but we will hold for now while she starts new Metformin. Next follow-up discuss resuming Fenofibrate 130mg  daily for 1-2 weeks at lower dose, then inc to 160mg  if tolerated in future.  4. Right breast Abnormal Mammogram / Nipple Inversion - She is scheduled for bilateral breast MRI on 08/10/15. She has a future R-breast stereotatic biopsy scheduled tentatively for 08/23/15, unless results of MRI indicate other management. Results to be followed up by breast center. Additionally, patient had a complaint of "swollen area or knot at base of Right neck" last office visit 1/5 I did appreciate only mild swelling without focal mass or lymphadenopathy in this area, also no thyromegaly, she is asking if MRI will cover this region, and I do not believe so. I advised her that we will follow-up with results as currently  scheduled, monitor this area in future.  Nobie Putnam, [place], PGY-3

## 2015-08-10 ENCOUNTER — Ambulatory Visit (HOSPITAL_COMMUNITY)
Admission: RE | Admit: 2015-08-10 | Discharge: 2015-08-10 | Disposition: A | Discharge status: Home / Self Care | Payer: 59 | Source: Ambulatory Visit | Attending: Family Medicine | Admitting: Family Medicine

## 2015-08-10 DIAGNOSIS — R921 Mammographic calcification found on diagnostic imaging of breast: Secondary | ICD-10-CM | POA: Insufficient documentation

## 2015-08-10 DIAGNOSIS — N63 Unspecified lump in breast: Secondary | ICD-10-CM | POA: Diagnosis not present

## 2015-08-10 DIAGNOSIS — N6459 Other signs and symptoms in breast: Secondary | ICD-10-CM | POA: Insufficient documentation

## 2015-08-10 MED ORDER — GADOBENATE DIMEGLUMINE 529 MG/ML IV SOLN
15.0000 mL | Freq: Once | INTRAVENOUS | Status: AC | PRN
  Administered 2015-08-10: 14 mL via INTRAVENOUS

## 2015-08-11 ENCOUNTER — Telehealth: Payer: Self-pay | Admitting: Family Medicine

## 2015-08-11 DIAGNOSIS — R921 Mammographic calcification found on diagnostic imaging of breast: Secondary | ICD-10-CM

## 2015-08-11 NOTE — Telephone Encounter (Signed)
Called patient, spoke to her today 08/11/15 and discussed her MRI Breast results with suspicious enhancing mass right upper outer quad. This is concerning for malignancy until further biopsy results. She understands this. I discussed next steps as recommended by Dr Enriqueta Shutter. Future order placed for Right breast MR Biopsy, this is to be scheduled by breast center before stereotactic biopsy on 08/23/15 to correlate pathology of calcifications and mass. She will follow-up with breast center for further information and scheduling on this.  Forwarded to Terie Purser, Nurse Navigator at breast center. If any order changes or clarifications are needed please contact me.  Nobie Putnam, [place], PGY-3

## 2015-08-11 NOTE — Telephone Encounter (Signed)
-----   Message from Lupita Dawn, MD sent at 08/11/2015  2:40 PM EST ----- Regarding: RE: MRI follow up Ms. Mel Almond,  I am not the primary provider for this patient. I have CC'ed Dr. Parks Ranger who is her PCP. Please send all further correspondence to him. Thanks.  Dossie Arbour  ----- Message -----    From: Bary Leriche, RN    Sent: 08/11/2015   8:32 AM      To: Lupita Dawn, MD Subject: MRI follow up                                  Dr. Ree Kida,   Here is the recommendation from Dr. Franki Cabot for [name]. He would like her to have her MR Bx before her  Stereotactic biopsy which is scheduled on 08-23-15. Please discuss the results with the patient and place orders in EPIC.  I will notify one of our schedulers to get this scheduled.  Feel free to contact me with any questions.  Thank you,  Terie Purser RN, RNFA Nurse Navigator [facility] [phone]      ADDENDUM REPORT: 08/11/2015 08:20 ADDENDUM: After further consideration, MRI biopsy is recommended for the suspicious enhancing mass within the upper-outer quadrant of the right breast, 11 o'clock axis, at posterior depth, measuring 8 x 5 x 7 mm. A post biopsy mammogram will be obtained to ensure that the MRI biopsy site corresponds to the area of calcifications seen on earlier diagnostic mammogram. Additionally, correlation will be made with pathology to ensure calcifications are in the biopsy specimen. If the post biopsy clip does not correspond to the area of calcifications, or if calcifications are not present within the biopsy specimen, an additional stereotactic biopsy of the right breast calcifications would then be needed. Electronically Signed  By: Franki Cabot M.D.  On: 08/11/2015 08:20

## 2015-08-12 ENCOUNTER — Other Ambulatory Visit: Payer: Self-pay | Admitting: Family Medicine

## 2015-08-12 DIAGNOSIS — R928 Other abnormal and inconclusive findings on diagnostic imaging of breast: Secondary | ICD-10-CM

## 2015-08-12 DIAGNOSIS — R921 Mammographic calcification found on diagnostic imaging of breast: Secondary | ICD-10-CM

## 2015-08-18 ENCOUNTER — Ambulatory Visit
Admission: RE | Admit: 2015-08-18 | Discharge: 2015-08-18 | Disposition: A | Discharge status: Home / Self Care | Payer: 59 | Source: Ambulatory Visit | Attending: Family Medicine | Admitting: Family Medicine

## 2015-08-18 DIAGNOSIS — N63 Unspecified lump in breast: Secondary | ICD-10-CM | POA: Diagnosis not present

## 2015-08-18 DIAGNOSIS — D0511 Intraductal carcinoma in situ of right breast: Secondary | ICD-10-CM | POA: Diagnosis not present

## 2015-08-18 DIAGNOSIS — R928 Other abnormal and inconclusive findings on diagnostic imaging of breast: Secondary | ICD-10-CM

## 2015-08-18 DIAGNOSIS — R921 Mammographic calcification found on diagnostic imaging of breast: Secondary | ICD-10-CM

## 2015-08-18 DIAGNOSIS — C50911 Malignant neoplasm of unspecified site of right female breast: Secondary | ICD-10-CM | POA: Diagnosis not present

## 2015-08-18 MED ORDER — GADOBENATE DIMEGLUMINE 529 MG/ML IV SOLN
13.0000 mL | Freq: Once | INTRAVENOUS | Status: AC | PRN
  Administered 2015-08-18: 13 mL via INTRAVENOUS

## 2015-08-19 ENCOUNTER — Encounter: Payer: Self-pay | Admitting: *Deleted

## 2015-08-19 NOTE — Progress Notes (Signed)
Met with pt in the office. Scheduled and confirmed appt with Dr. Lindi Adie on 08/26/15 at 10:00. Sent referral for pt to see Dr. Pablo Ledger.

## 2015-08-20 ENCOUNTER — Telehealth: Payer: Self-pay | Admitting: Family Medicine

## 2015-08-20 NOTE — Telephone Encounter (Signed)
Last testing done 08/18/15 with MRI guided R-breast biopsy, results of surgical pathology confirm our concerns of breast cancer, with grade 2 invasive ductal carcinoma and DCIS (see surgical pathology report for details). Patient was notified of these results by breast center, and already referred and scheduled to see Dr Lindi Adie (Med-Oncology) along with future Surgical referral to establish with rest of her multidisciplinary team.  I called patient to check on her to see how she was handling the recent results and answer any questions. Spoke to her today 08/20/15. She was in good spirits, and anticipating further testing and establishing with oncology next week. She did not have many questions, but did ask about the small area of "swelling" that we discussed at last office visit 07/22/15, this area was at Right base of neck / [place] joint, it was most consistent with a mild non-specific soft tissue swelling without discrete palpable lymph node or mass. I had asked her to discuss this with breast center to see if MRI would include this area, and it does not seem like it necessarily did. Advised her to continue to follow-up to discuss this area of concern with Oncology, anticipate further staging evaluations.  Nobie Putnam, [place], PGY-3

## 2015-08-23 ENCOUNTER — Ambulatory Visit: Payer: 59

## 2015-08-24 ENCOUNTER — Encounter: Payer: Self-pay | Admitting: *Deleted

## 2015-08-24 NOTE — Progress Notes (Signed)
Scheduled and confirmed appt with Dr. [name] on 08/25/15 at 4:00PM.

## 2015-08-25 DIAGNOSIS — C50411 Malignant neoplasm of upper-outer quadrant of right female breast: Secondary | ICD-10-CM | POA: Diagnosis not present

## 2015-08-25 NOTE — Progress Notes (Signed)
Location of Breast Cancer:Breast Cance rUpper Outer Quadrant Right Breast  Histology per Pathology Report: 08-18-15 Diagnosis Breast, right, needle core biopsy, lateral - INVASIVE AND IN SITU DUCTAL CARCINOMA. Microscopic Comment The findings are consistent with grade 2 invasive ductal carcinoma and intermediate grade ductal carcinoma in situ with focal necrosis. There is also a microscopic focus with lobular non necrotizing granulomas. Breast prognostic profile will be performed. Dr. Avis Epley agrees. Called to [facility] on 08/19/15. (JDP:gt, 08/19/15) Claudette Laws MD Pathologist, Electronic Signature (Case signed 08/19/2015) Specimen Gross and Clinical Information Specimen Comment In formalin 8:15; nodule seen on mri Specimen(s) Obtained: Breast, right, needle core biopsy, lateral Specimen Clinical Information R/O [facility] Gross Received Receptor Status: ER(100%), PR (0%, Her2-neu (+),Ki-67 (50%)  Nichole Wilson  presented with suspicious calcifications on mammogram 08-18-15  Past/Anticipated interventions by surgeon, if BMB:OMQT scheduled yet  Past/Anticipated interventions by medical oncology, if TCN:GFRE needle biopsy 08-18-15 Lymphedema issues, if any:  No  Pain issues, if any: No  SAFETY ISSUES:  Prior radiation? No  Pacemaker/ICD? No  Possible current pregnancy?No  Is the patient on methotrexate? No  Current Complaints / other details:  Menarche age 55, G78, P86, Menopause age 89, BC 3 years 2003-2005, HRT No BP 141/87 mmHg  Pulse 103  Temp(Src) 97.9 F (36.6 C) (Oral)  Ht _0  (1.575 m)  Wt 155 lb (70.308 kg)  BMI 28.34 kg/m2  SpO2 98%  LMP 11/29/2011 Georgena Spurling, RN 08/25/2015,1:29 PM

## 2015-08-26 ENCOUNTER — Ambulatory Visit
Admission: RE | Admit: 2015-08-26 | Discharge: 2015-08-26 | Disposition: A | Discharge status: Home / Self Care | Payer: 59 | Source: Ambulatory Visit | Attending: Radiation Oncology | Admitting: Radiation Oncology

## 2015-08-26 ENCOUNTER — Ambulatory Visit (HOSPITAL_BASED_OUTPATIENT_CLINIC_OR_DEPARTMENT_OTHER): Payer: 59 | Admitting: Hematology and Oncology

## 2015-08-26 ENCOUNTER — Encounter: Payer: Self-pay | Admitting: Radiation Oncology

## 2015-08-26 ENCOUNTER — Other Ambulatory Visit: Payer: Self-pay | Admitting: *Deleted

## 2015-08-26 ENCOUNTER — Encounter: Payer: Self-pay | Admitting: Hematology and Oncology

## 2015-08-26 VITALS — BP 160/93 | HR 106 | Temp 97.6°F | Resp 18 | Ht 62.0 in | Wt 156.7 lb

## 2015-08-26 VITALS — BP 141/87 | HR 103 | Temp 97.9°F | Ht 62.0 in | Wt 155.0 lb

## 2015-08-26 DIAGNOSIS — C50411 Malignant neoplasm of upper-outer quadrant of right female breast: Secondary | ICD-10-CM

## 2015-08-26 DIAGNOSIS — Z170 Estrogen receptor positive status [ER+]: Secondary | ICD-10-CM | POA: Insufficient documentation

## 2015-08-26 HISTORY — DX: Malignant neoplasm of upper-outer quadrant of right female breast: C50.411

## 2015-08-26 NOTE — Progress Notes (Signed)
[facility] CONSULT NOTE  Patient Care Team: Olin Hauser, DO as PCP - General (Osteopathic Medicine)  CHIEF COMPLAINTS/PURPOSE OF CONSULTATION:  Newly diagnosed breast cancer  HISTORY OF PRESENTING ILLNESS:  Nichole Wilson 55 y.o. female is here because of recent diagnosis of right breast cancer. Patient had a mammogram for evaluation of nipple inversion that revealed an enhancing mass in the right breast upper-outer quadrant this was further evaluated by ultrasound. She also had a breast MRI that revealed an 8 mm mass. There was no retroareolar right breast abnormality on any abnormality at the site of nipple retraction. She underwent a biopsy on 08/18/2015 which revealed invasive ductal carcinoma with DCIS. 2, ER 100%, PR 0%, Ki-67 5% and HER-2 was positive with a ratio 4.52. She was sent to see Dr. [name] who reviewed her imaging. She also had a mammogram after the breast MRI would seem to suggest that the span of calcifications was measuring 4 cm. She has appointment to see Dr. Pablo Ledger later today. She was referred to [place] for discussion regarding multidisciplinary care. 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  SUMMARY OF ONCOLOGIC HISTORY:   Breast cancer of upper-outer quadrant of right female breast ([place])   08/10/2015 Breast MRI Enhancing mass in right breast upper outer quadrant 8 x 5 x 7 mm, no retroareolar right breast abnormality, no abnormality of the site of nipple retraction   08/18/2015 Initial Biopsy Right breast biopsy: Invasive ductal carcinoma with DCIS, grade 2 with focal necrosis,ER 100%, PR 0%, Ki-67 5%,HER-2 positive ratio 4.52   08/19/2015 Mammogram Right breast calcifications now seem to span 4 x 2 x 1.5 cm   MEDICAL HISTORY:  Past Medical History  Diagnosis Date  . Hypertriglyceridemia   . Hepatic steatosis   . Breast cyst, left  01-04-12    cyst no longer apparent  . Migraine without aura, without mention of intractable migraine without mention of status migrainosus   . Hypercholesterolemia with hypertriglyceridemia   . Cholelithiasis with choledocholithiasis   . Glucose intolerance (impaired glucose tolerance)     since at least 2011.   . Gallstone pancreatitis   . Hypertension 01-04-12    med d/c-last taken 12-07-11  -PT Carlin STOPPED HER B/P MEDICATION BECAUSE HER B/P TOO LOW AND PT STATES HER B/P'S HAVE BEEN OK SINCE.  [name] Complication of anesthesia 2002    PT STATES DURING HER C-SECTION SHE WAS AWARE OF INCISION AND PAIN -THEN WAS QUICKLY "PUT OUT"  . Chronic calculus cholecystitis 12/25/2011    Lap chole on 03/25/12     SURGICAL HISTORY: Past Surgical History  Procedure Laterality Date  . Cesarean section      2002  . Ercp  12/05/2011    Procedure: ENDOSCOPIC RETROGRADE CHOLANGIOPANCREATOGRAPHY (ERCP);  Surgeon: Inda Castle, MD;  Location: [facility];  Service: Endoscopy;  Laterality: N/A;  . Ercp  12/08/2011    Procedure: ENDOSCOPIC RETROGRADE CHOLANGIOPANCREATOGRAPHY (ERCP);  Surgeon: Inda Castle, MD;  Location: [facility];  Service: Gastroenterology;  Laterality: N/A;  . Cholecystectomy  03/25/2012    Procedure: LAPAROSCOPIC CHOLECYSTECTOMY WITH INTRAOPERATIVE CHOLANGIOGRAM;  Surgeon: Haywood Lasso, MD;  Location: [facility];  Service: General;  Laterality: N/A;  Laparoscopic Cholecystectomy with Intraoperative Cholangiogram    SOCIAL HISTORY: Social History   Social History  . Marital Status: Married    Spouse Name: N/A  . Number  of Children: 2  . Years of Education: N/A   Occupational History  . 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    Social History Main Topics  . Smoking status: Never Smoker   . Smokeless tobacco: Never Used  . Alcohol Use: No  . Drug Use: No  . Sexual Activity: Yes   Other Topics Concern  . Not on file    Social History Narrative   No caffein    FAMILY HISTORY: Family History  Problem Relation Age of Onset  . Diabetes Mother   . Diabetes Father   . Diabetes Brother   . Colon cancer Neg Hx     ALLERGIES:  has No Known Allergies.  MEDICATIONS:  Current Outpatient Prescriptions  Medication Sig Dispense Refill  . fenofibrate 160 MG tablet Take 1 tablet (160 mg total) by mouth daily. (Patient not taking: Reported on 05/12/2015) 30 tablet 3  . hydrochlorothiazide (HYDRODIURIL) 12.5 MG tablet Take 1 tablet (12.5 mg total) by mouth daily. 90 tablet 3  . Icosapent Ethyl 1 g CAPS Take 2 g by mouth 2 (two) times daily with a meal. 120 capsule 5  . metFORMIN (GLUCOPHAGE) 500 MG tablet Take 1 tablet (500 mg total) by mouth 2 (two) times daily with a meal. Start 1 tab nightly for 1 week, then increase to twice daily. 60 tablet 3  . Multiple Vitamin (MULTIVITAMIN WITH MINERALS) TABS Take 1 tablet by mouth daily.    [name] olopatadine (PATANOL) 0.1 % ophthalmic solution   2  . pravastatin (PRAVACHOL) 40 MG tablet Take 1 tablet (40 mg total) by mouth at bedtime. (Patient not taking: Reported on 08/26/2015) 30 tablet 11  . prednisoLONE acetate (PRED FORTE) 1 % ophthalmic suspension Reported on 08/26/2015  0  . vitamin C (ASCORBIC ACID) 500 MG tablet Take 500 mg by mouth daily.    . vitamin E 1000 UNIT capsule Take 1,000 Units by mouth daily.     No current facility-administered medications for this visit.    REVIEW OF SYSTEMS:   Constitutional: Denies fevers, chills or abnormal night sweats Eyes: Denies blurriness of vision, double vision or watery eyes Ears, nose, mouth, throat, and face: Denies mucositis or sore throat Respiratory: Denies cough, dyspnea or wheezes Cardiovascular: Denies palpitation, chest discomfort or lower extremity swelling Gastrointestinal:  Denies nausea, heartburn or change in bowel habits Skin: Denies abnormal skin rashes Lymphatics: Denies new lymphadenopathy or easy  bruising Neurological:Denies numbness, tingling or new weaknesses Behavioral/Psych: Mood is stable, no new changes  Breast: nipple inversion All other systems were reviewed with the patient and are negative.  PHYSICAL EXAMINATION: ECOG PERFORMANCE STATUS: 0 - Asymptomatic  Filed Vitals:   08/26/15 1020  BP: 160/93  Pulse: 106  Temp: 97.6 F (36.4 C)  Resp: 18   Filed Weights   08/26/15 1020  Weight: 156 lb 11.2 oz (71.079 kg)    GENERAL:alert, no distress and comfortable SKIN: skin color, texture, turgor are normal, no rashes or significant lesions EYES: normal, conjunctiva are pink and non-injected, sclera clear OROPHARYNX:no exudate, no erythema and lips, buccal mucosa, and tongue normal  NECK: supple, thyroid normal size, non-tender, without nodularity LYMPH:  no palpable lymphadenopathy in the cervical, axillary or inguinal LUNGS: clear to auscultation and percussion with normal breathing effort HEART: regular rate & rhythm and no murmurs and no lower extremity edema ABDOMEN:abdomen soft, non-tender and normal bowel sounds Musculoskeletal:no cyanosis of digits and no clubbing  PSYCH:  alert & oriented x 3 with fluent speech NEURO: no focal motor/sensory deficits BREAST: No palpable nodules in breast. No palpable axillary or supraclavicular lymphadenopathy (exam performed in the presence of a chaperone)   LABORATORY DATA:  I have reviewed the data as listed Lab Results  Component Value Date   WBC 4.7 07/30/2015   HGB 12.8 07/30/2015   HCT 38.5 07/30/2015   MCV 92.1 07/30/2015   PLT 322 07/30/2015   Lab Results  Component Value Date   NA 137 05/12/2015   K 4.3 05/12/2015   CL 99 05/12/2015   CO2 26 05/12/2015    RADIOGRAPHIC STUDIES: I have personally reviewed the radiological reports and agreed with the findings in the report.  ASSESSMENT AND PLAN:  Breast cancer of upper-outer quadrant of right female breast ([place]) Right breast biopsy 08/18/2015 : Invasive  ductal carcinoma with DCIS, grade 2 with focal necrosis,ER 100%, PR 0%, Ki-67 5%,HER-2 positive ratio 4.52 Breast MRI: Enhancing mass in right breast upper outer quadrant 8 x 5 x 7 mm, no retroareolar right breast abnormality, no abnormality of the site of nipple retraction Clinical staging: T1b N0 stage IA ( if he believed the MRI size of 8 mm) vs T2 N0 stage II a ( based on the extent of calcifications)  Recommendation: 1. Staging evaluation with CT chest abdomen pelvis and bone scan 2. Breast conserving surgery 3. Followed by adjuvant chemotherapy with Herceptin if  the final tumor size greater than 0.5 cm 4. Followed by adjuvant radiation 5. Follow-up adjuvant antiestrogen therapy  Patient is still uncertain if she wants to do a lumpectomy versus mastectomy. Return to clinic after surgery to discuss the final pathology report and the final adjuvant treatment plan.    All questions were answered. The patient knows to call the clinic with any problems, questions or concerns.    Rulon Eisenmenger, MD 08/26/2015

## 2015-08-26 NOTE — Assessment & Plan Note (Addendum)
Right breast biopsy 08/18/2015 : Invasive ductal carcinoma with DCIS, grade 2 with focal necrosis,ER 100%, PR 0%, Ki-67 5%,HER-2 positive ratio 4.52 Breast MRI: Enhancing mass in right breast upper outer quadrant 8 x 5 x 7 mm, no retroareolar right breast abnormality, no abnormality of the site of nipple retraction Clinical staging: T1b N0 stage IA ( if he believed the MRI size of 8 mm) vs T2 N0 stage II a ( based on the extent of calcifications)  Recommendation: 1. Staging evaluation with CT chest abdomen pelvis and bone scan 2. Breast conserving surgery 3. Followed by adjuvant chemotherapy with Herceptin if  the final tumor size greater than 0.5 cm 4. Followed by adjuvant radiation 5. Follow-up adjuvant antiestrogen therapy  Patient is still uncertain if she wants to do a lumpectomy versus mastectomy. Return to clinic after surgery to discuss the final pathology report and the final adjuvant treatment plan.

## 2015-08-26 NOTE — Progress Notes (Signed)
Radiation Oncology         [phone]) [phone] ________________________________  Initial outpatient Consultation - Date: 08/26/2015   Name: Nichole Wilson MRN: [id number]   DOB: 1961/01/19  REFERRING PHYSICIAN: Nobie Putnam *  DIAGNOSIS AND STAGE: T1 N0 Stage 1 right breast cancer  HISTORY OF PRESENT ILLNESS::Nichole Wilson is a pleasant 55 y.o. woman with T1 N0 Stage 1 right breast cancer. She underwent screening mammagram in January 2017. This showed calicfications in the upper-outer quadrant of the right breast as well as nipple retraction. Per patient's report, the nipple retraction had been on going for the past 2 years. Further workup included an ultrasound which showed no abnormal findings. She underwent a MRI on 08/11/2015 which showed an enhancing mass in the upper outer-quadrant measuring 8 mm. A biopsy of this mass was performed on 08/18/15 which showed invasive ductal carcinoma with associated DCIS. This was ER positive PR negative with a KI67 of 5% and this was HER2 positive. The post clip films showed a larger area of calcifications which were suspicious now measuring 4 cm. She has met with Dr. [name] and Dr. Lindi Adie and due to size of calcifications compared to size of breast, mastectomy has been recommended. She has no family history of malignancy. She is accompanied by her good friends today.   PREVIOUS RADIATION THERAPY: No  Past medical, social and family history were reviewed in the electronic chart. Review of symptoms was reviewed in the electronic chart. Medications were reviewed in the electronic chart.   PHYSICAL EXAM:  Filed Vitals:   08/26/15 1124  BP: 141/87  Pulse: 103  Temp: 97.9 F (36.6 C)  .155 lb (70.308 kg).   Pleasant female. No distress. Patient declined breast examination today.   IMPRESSION: Nichole Wilson is a pleasant 55 y.o. woman with T1 N0 Stage 1 right breast cancer.  PLAN: I spoke to the patient today regarding her diagnosis and options  for treatment. We discussed the equivalence in terms of survival and local failure between mastectomy and breast conservation. We discussed the role of radiation in decreasing local failures in patients who undergo mastectomy and have positive lymph nodes or tumors greater than 5 cm. We discussed the role of radiation and decreasing local failures in patients who undergo lumpectomy.We discussed the possible side effects including but not limited to asymptomatic rib, heart and lung damage, heart disease, skin redness, fatigue, permanent skin darkening, and chest wall swelling. We discussed increased complications that can occur with reconstruction after radiation. We discussed the process of simulation and the placement tattoos. We discussed the low likelihood of secondary malignancies.    At this point, due to the size of her calcifications in relation to the size of her breast, she has chosen a mastectomy. She has declined a second biopsy. She understands that she may require chemotherapy based on her final pathology. She is comfortable with her decisions. I will see her back if needed based on final pathology.   I spent 40 minutes  face to face with the patient and more than 50% of that time was spent in counseling and/or coordination of care.   ------------------------------------------------  Thea Silversmith, MD  This document serves as a record of services personally performed by Thea Silversmith, MD. It was created on her behalf by Jenell Milliner, a trained medical scribe. The creation of this record is based on the scribe's personal observations and the provider's statements to them. This document has been checked and approved by the  attending provider.

## 2015-09-09 ENCOUNTER — Other Ambulatory Visit: Payer: Self-pay

## 2015-09-09 DIAGNOSIS — C50411 Malignant neoplasm of upper-outer quadrant of right female breast: Secondary | ICD-10-CM | POA: Diagnosis not present

## 2015-09-10 ENCOUNTER — Other Ambulatory Visit: Payer: Self-pay | Admitting: General Surgery

## 2015-09-10 ENCOUNTER — Ambulatory Visit (HOSPITAL_COMMUNITY)
Admission: RE | Admit: 2015-09-10 | Discharge: 2015-09-10 | Disposition: A | Discharge status: Home / Self Care | Payer: 59 | Source: Ambulatory Visit | Attending: Hematology and Oncology | Admitting: Hematology and Oncology

## 2015-09-10 ENCOUNTER — Encounter (HOSPITAL_COMMUNITY)
Admission: RE | Admit: 2015-09-10 | Discharge: 2015-09-10 | Disposition: A | Discharge status: Home / Self Care | Payer: 59 | Source: Ambulatory Visit | Attending: Hematology and Oncology | Admitting: Hematology and Oncology

## 2015-09-10 ENCOUNTER — Encounter (HOSPITAL_COMMUNITY): Payer: Self-pay

## 2015-09-10 ENCOUNTER — Encounter (HOSPITAL_COMMUNITY): Payer: 59

## 2015-09-10 ENCOUNTER — Other Ambulatory Visit: Payer: Self-pay | Admitting: Hematology and Oncology

## 2015-09-10 ENCOUNTER — Ambulatory Visit (HOSPITAL_COMMUNITY): Payer: 59

## 2015-09-10 DIAGNOSIS — C50411 Malignant neoplasm of upper-outer quadrant of right female breast: Secondary | ICD-10-CM

## 2015-09-10 DIAGNOSIS — K760 Fatty (change of) liver, not elsewhere classified: Secondary | ICD-10-CM | POA: Insufficient documentation

## 2015-09-10 DIAGNOSIS — C50911 Malignant neoplasm of unspecified site of right female breast: Secondary | ICD-10-CM | POA: Diagnosis not present

## 2015-09-10 DIAGNOSIS — R948 Abnormal results of function studies of other organs and systems: Secondary | ICD-10-CM | POA: Insufficient documentation

## 2015-09-10 DIAGNOSIS — C50919 Malignant neoplasm of unspecified site of unspecified female breast: Secondary | ICD-10-CM | POA: Diagnosis not present

## 2015-09-10 DIAGNOSIS — K319 Disease of stomach and duodenum, unspecified: Secondary | ICD-10-CM | POA: Diagnosis not present

## 2015-09-10 MED ORDER — TECHNETIUM TC 99M MEDRONATE IV KIT
23.7000 | PACK | Freq: Once | INTRAVENOUS | Status: AC | PRN
  Administered 2015-09-10: 23.7 via INTRAVENOUS

## 2015-09-10 MED ORDER — IOHEXOL 300 MG/ML  SOLN
100.0000 mL | Freq: Once | INTRAMUSCULAR | Status: AC | PRN
  Administered 2015-09-10: 100 mL via INTRAVENOUS

## 2015-09-12 ENCOUNTER — Other Ambulatory Visit: Payer: Self-pay | Admitting: Oncology

## 2015-09-13 ENCOUNTER — Other Ambulatory Visit: Payer: Self-pay | Admitting: Hematology and Oncology

## 2015-09-13 DIAGNOSIS — K317 Polyp of stomach and duodenum: Secondary | ICD-10-CM

## 2015-09-13 NOTE — Pre-Procedure Instructions (Addendum)
Barara Coggeshall Hoe  09/13/2015      [facility] - [place], [place] - [address] [place] [address] Phone: [phone] Fax: [place], [place] [address] Phone: [phone] Fax: [phone]  [facility] [place], [place] [address] Phone: [phone] Fax: [phone]    Your procedure is scheduled on Wednesday, September 14, 2016  Report to [facility] Admitting at 8:45 A.M.  Call this number if you have problems the morning of surgery:  [phone]   Remember:  Do not eat food or drink liquids after midnight.  Take these medicines the morning of surgery with A SIP OF WATER : None  Stop taking Aspirin, vitamins ( C, E, multi ), fish oil (Icosapent Ethyl ) and herbal medications. Do not take any NSAIDs ie: Ibuprofen, Advil, Naproxen, BC and Goody Powder or any medication containing Aspirin stop now. How to Manage Your Diabetes Before Surgery Why is it important to control my blood sugar before and after surgery?   Improving blood sugar levels before and after surgery helps healing and can limit problems.  A way of improving blood sugar control is eating a healthy diet by:  - Eating less sugar and carbohydrates  - Increasing activity/exercise  - Talk with your doctor about reaching your blood sugar goals  High blood sugars (greater than 180 mg/dL) can raise your risk of infections and slow down your recovery so you will need to focus on controlling your diabetes during the weeks before surgery.  Make sure that the doctor who takes care of your diabetes knows about your planned surgery including the date and location.  How do I manage my blood sugars before surgery?   Check your blood sugar at least 4 times a day, 2 days before surgery to  make sure that they are not too high or low.   Check your blood sugar the morning of your surgery when you wake up and every 2  hours until you get to the Short-Stay unit.  If your blood sugar is less than 70 mg/dL, you will need to treat for low blood sugar by:  Treat a low blood sugar (less than 70 mg/dL) with 1/2 cup of clear juice (cranberry or apple), 4 glucose tablets, OR glucose gel.  Recheck blood sugar in 15 minutes after treatment (to make sure it is greater than 70 mg/dL).  If blood sugar is not greater than 70 mg/dL on re-check, call [phone] for further instructions.   Report your blood sugar to the Short-Stay nurse when you get to Short-Stay.  References:  [facility], 2007 "How to Manage your Diabetes Before and After Surgery".  What do I do about my diabetes medications?   Do not take oral diabetes medicines (pills) the morning of surgery such as metFORMIN (GLUCOPHAGE)   Do not wear jewelry, make-up or nail polish.  Do not wear lotions, powders, or perfumes.  You may not wear deodorant.  Do not shave 48 hours prior to surgery.    Do not bring valuables to the hospital.  [facility] is not responsible for any belongings or valuables.  Contacts, dentures or bridgework may not be worn into surgery.  Leave your suitcase in the car.  After surgery it may be brought to  your room.  For patients admitted to the hospital, discharge time will be determined by your treatment team.  Patients discharged the day of surgery will not be allowed to drive home.   Name and phone number of your driver:   Special instructions:  [place] - Preparing for Surgery  Before surgery, you can play an important role.  Because skin is not sterile, your skin needs to be as free of germs as possible.  You can reduce the number of germs on you skin by washing with CHG (chlorahexidine gluconate) soap before surgery.  CHG is an antiseptic cleaner which kills germs and  bonds with the skin to continue killing germs even after washing.  Please DO NOT use if you have an allergy to CHG or antibacterial soaps.  If your skin becomes reddened/irritated stop using the CHG and inform your nurse when you arrive at Short Stay.  Do not shave (including legs and underarms) for at least 48 hours prior to the first CHG shower.  You may shave your face.  Please follow these instructions carefully:   1.  Shower with CHG Soap the night before surgery and the morning of Surgery.  2.  If you choose to wash your hair, wash your hair first as usual with your normal shampoo.  3.  After you shampoo, rinse your hair and body thoroughly to remove the Shampoo.  4.  Use CHG as you would any other liquid soap.  You can apply chg directly  to the skin and wash gently with scrungie or a clean washcloth.  5.  Apply the CHG Soap to your body ONLY FROM THE NECK DOWN.  Do not use on open wounds or open sores.  Avoid contact with your eyes, ears, mouth and genitals (private parts).  Wash genitals (private parts) with your normal soap.  6.  Wash thoroughly, paying special attention to the area where your surgery will be performed.  7.  Thoroughly rinse your body with warm water from the neck down.  8.  DO NOT shower/wash with your normal soap after using and rinsing off the CHG Soap.  9.  Pat yourself dry with a clean towel.            10.  Wear clean pajamas.            11.  Place clean sheets on your bed the night of your first shower and do not sleep with pets.  Day of Surgery  Do not apply any lotions/deodorants the morning of surgery.  Please wear clean clothes to the hospital/surgery center.  Please read over the following fact sheets that you were given. Pain Booklet, Coughing and Deep Breathing and Surgical Site Infection Prevention

## 2015-09-14 ENCOUNTER — Encounter (HOSPITAL_COMMUNITY): Payer: Self-pay

## 2015-09-14 ENCOUNTER — Encounter (HOSPITAL_COMMUNITY)
Admission: RE | Admit: 2015-09-14 | Discharge: 2015-09-14 | Disposition: A | Discharge status: Home / Self Care | Payer: 59 | Source: Ambulatory Visit | Attending: General Surgery | Admitting: General Surgery

## 2015-09-14 ENCOUNTER — Other Ambulatory Visit: Payer: Self-pay | Admitting: *Deleted

## 2015-09-14 DIAGNOSIS — D0511 Intraductal carcinoma in situ of right breast: Secondary | ICD-10-CM | POA: Diagnosis not present

## 2015-09-14 DIAGNOSIS — Z7984 Long term (current) use of oral hypoglycemic drugs: Secondary | ICD-10-CM | POA: Diagnosis not present

## 2015-09-14 DIAGNOSIS — E119 Type 2 diabetes mellitus without complications: Secondary | ICD-10-CM | POA: Diagnosis not present

## 2015-09-14 DIAGNOSIS — Z7982 Long term (current) use of aspirin: Secondary | ICD-10-CM | POA: Diagnosis not present

## 2015-09-14 DIAGNOSIS — I10 Essential (primary) hypertension: Secondary | ICD-10-CM | POA: Diagnosis not present

## 2015-09-14 HISTORY — DX: Other specified postprocedural states: Z98.890

## 2015-09-14 HISTORY — DX: Prediabetes: R73.03

## 2015-09-14 HISTORY — DX: Nausea with vomiting, unspecified: R11.2

## 2015-09-14 HISTORY — DX: Malignant (primary) neoplasm, unspecified: C80.1

## 2015-09-14 LAB — CBC WITH DIFFERENTIAL/PLATELET
BASOS PCT: 0 %
Basophils Absolute: 0 10*3/uL (ref 0.0–0.1)
EOS ABS: 0.1 10*3/uL (ref 0.0–0.7)
Eosinophils Relative: 1 %
HCT: 39.2 % (ref 36.0–46.0)
HEMOGLOBIN: 13.3 g/dL (ref 12.0–15.0)
LYMPHS ABS: 2 10*3/uL (ref 0.7–4.0)
Lymphocytes Relative: 39 %
MCH: 31.3 pg (ref 26.0–34.0)
MCHC: 33.9 g/dL (ref 30.0–36.0)
MCV: 92.2 fL (ref 78.0–100.0)
Monocytes Absolute: 0.3 10*3/uL (ref 0.1–1.0)
Monocytes Relative: 6 %
NEUTROS ABS: 2.9 10*3/uL (ref 1.7–7.7)
NEUTROS PCT: 54 %
Platelets: 290 10*3/uL (ref 150–400)
RBC: 4.25 MIL/uL (ref 3.87–5.11)
RDW: 13.3 % (ref 11.5–15.5)
WBC: 5.2 10*3/uL (ref 4.0–10.5)

## 2015-09-14 LAB — HEPATIC FUNCTION PANEL
ALBUMIN: 4.1 g/dL (ref 3.5–5.0)
ALT: 36 U/L (ref 14–54)
AST: 32 U/L (ref 15–41)
Alkaline Phosphatase: 71 U/L (ref 38–126)
BILIRUBIN TOTAL: 0.6 mg/dL (ref 0.3–1.2)
Bilirubin, Direct: <0.1 mg/dL — ABNORMAL LOW (ref 0.1–0.5)
TOTAL PROTEIN: 7.5 g/dL (ref 6.5–8.1)

## 2015-09-14 LAB — BASIC METABOLIC PANEL
ANION GAP: 10 (ref 5–15)
BUN: 14 mg/dL (ref 6–20)
CHLORIDE: 108 mmol/L (ref 101–111)
CO2: 25 mmol/L (ref 22–32)
CREATININE: 0.62 mg/dL (ref 0.44–1.00)
Calcium: 9.7 mg/dL (ref 8.9–10.3)
GFR calc non Af Amer: >60 mL/min (ref >60)
Glucose, Bld: 133 mg/dL — ABNORMAL HIGH (ref 65–99)
Potassium: 3.9 mmol/L (ref 3.5–5.1)
SODIUM: 143 mmol/L (ref 135–145)

## 2015-09-14 LAB — GLUCOSE, CAPILLARY: GLUCOSE-CAPILLARY: 137 mg/dL — AB (ref 65–99)

## 2015-09-14 NOTE — Progress Notes (Signed)
Pt denies SOB, chest pain, and being under the care of a cardiologist. Pt denies having an echo and cardiac cath but stated that a stress test was completed 10 years ago. Anesthesia asked to review EKG ( see note).

## 2015-09-14 NOTE — Progress Notes (Signed)
Anesthesia Chart Review: Patient is a 55 year old female scheduled for right mastectomy with SN biopsy on 09/15/15 by Dr. [name].  History includes nonsmoker, postoperative N/V, hypercholesterolemia with hypertriglyceridemia, hepatic steatosis, migraines, glucose intolerance/pre-diabetes, HTN, gallstone pancreatitis s/p cholecystectomy '13. Reports what sounds like failed spinal anesthesia (felt pain during c-section and had to be "put out"). PCP is listed as Dr. Nobie Putnam.  09/14/15 EKG: NSR, possible LAE, cannot rule out anterior infarct (age undetermined). When compared to her 12/05/11 tracing, r waves in V2-3 are now lower, but had reverse r wave progression in V2-3 then as well. No CV symptoms reported.  09/10/15 CT chest/abd/pelvis (for cancer staging): IMPRESSION: 1. 4.2 cm polypoid mass extending into the gastric lumen from the gastric cardia region. Faint calcification within the inferior portion of this polypoid mass. Differential diagnostic considerations include neoplastic polyp such as adenomatous polyp ; polypoid gastric adenocarcinoma ; non neoplastic polyps such as hyperplastic or hamartoma does polyps; or stimulators a polyp such as gastric carcinoid, GI stromal tumor, lymphoma, metastatic disease, or leiomyoma. Tissue diagnosis recommended. 2. No pathologic adenopathy in the chest, abdomen, or pelvis. 3. Diffuse hepatic steatosis.  Preoperative labs noted. A1c 6.3 on 07/30/15.  If no acute changes then I anticipate that she can proceed as planned.  George Hugh [facility]/Anesthesiology Phone [phone] 09/14/2015 12:29 PM

## 2015-09-14 NOTE — Patient Instructions (Signed)
.  how

## 2015-09-15 ENCOUNTER — Other Ambulatory Visit: Payer: Self-pay | Admitting: General Surgery

## 2015-09-15 ENCOUNTER — Encounter (HOSPITAL_COMMUNITY): Payer: Self-pay | Admitting: General Practice

## 2015-09-15 ENCOUNTER — Observation Stay (HOSPITAL_COMMUNITY)
Admission: RE | Admit: 2015-09-15 | Discharge: 2015-09-16 | Disposition: A | Discharge status: Home / Self Care | Payer: 59 | Source: Ambulatory Visit | Attending: General Surgery | Admitting: General Surgery

## 2015-09-15 ENCOUNTER — Encounter (HOSPITAL_COMMUNITY)
Admission: RE | Disposition: A | Discharge status: Home / Self Care | Payer: Self-pay | Source: Ambulatory Visit | Attending: General Surgery

## 2015-09-15 ENCOUNTER — Ambulatory Visit (HOSPITAL_COMMUNITY)
Admission: RE | Admit: 2015-09-15 | Discharge: 2015-09-15 | Disposition: A | Discharge status: Home / Self Care | Payer: 59 | Source: Ambulatory Visit | Attending: General Surgery | Admitting: General Surgery

## 2015-09-15 ENCOUNTER — Ambulatory Visit (HOSPITAL_COMMUNITY): Payer: 59 | Admitting: Anesthesiology

## 2015-09-15 ENCOUNTER — Ambulatory Visit (HOSPITAL_COMMUNITY): Payer: 59 | Admitting: Vascular Surgery

## 2015-09-15 DIAGNOSIS — Z7984 Long term (current) use of oral hypoglycemic drugs: Secondary | ICD-10-CM | POA: Diagnosis not present

## 2015-09-15 DIAGNOSIS — Z7982 Long term (current) use of aspirin: Secondary | ICD-10-CM | POA: Diagnosis not present

## 2015-09-15 DIAGNOSIS — C50411 Malignant neoplasm of upper-outer quadrant of right female breast: Secondary | ICD-10-CM

## 2015-09-15 DIAGNOSIS — G8918 Other acute postprocedural pain: Secondary | ICD-10-CM | POA: Diagnosis not present

## 2015-09-15 DIAGNOSIS — E119 Type 2 diabetes mellitus without complications: Secondary | ICD-10-CM | POA: Diagnosis not present

## 2015-09-15 DIAGNOSIS — D0511 Intraductal carcinoma in situ of right breast: Secondary | ICD-10-CM | POA: Diagnosis not present

## 2015-09-15 DIAGNOSIS — C50919 Malignant neoplasm of unspecified site of unspecified female breast: Secondary | ICD-10-CM | POA: Diagnosis present

## 2015-09-15 DIAGNOSIS — I10 Essential (primary) hypertension: Secondary | ICD-10-CM | POA: Diagnosis not present

## 2015-09-15 DIAGNOSIS — C50911 Malignant neoplasm of unspecified site of right female breast: Secondary | ICD-10-CM | POA: Diagnosis not present

## 2015-09-15 HISTORY — PX: MASTECTOMY: SHX3

## 2015-09-15 HISTORY — PX: MASTECTOMY W/ SENTINEL NODE BIOPSY: SHX2001

## 2015-09-15 LAB — GLUCOSE, CAPILLARY: Glucose-Capillary: 126 mg/dL — ABNORMAL HIGH (ref 65–99)

## 2015-09-15 SURGERY — MASTECTOMY WITH SENTINEL LYMPH NODE BIOPSY
Anesthesia: Regional | Site: Breast | Laterality: Right

## 2015-09-15 MED ORDER — CEFAZOLIN SODIUM-DEXTROSE 2-3 GM-% IV SOLR: 2.0000 g | INTRAVENOUS | Status: DC

## 2015-09-15 MED ORDER — ONDANSETRON HCL 4 MG/2ML IJ SOLN
4.0000 mg | Freq: Four times a day (QID) | INTRAMUSCULAR | Status: DC | PRN
  Administered 2015-09-15: 4 mg via INTRAVENOUS
  Filled 2015-09-15: qty 2

## 2015-09-15 MED ORDER — ONDANSETRON HCL 4 MG/2ML IJ SOLN
INTRAMUSCULAR | Status: DC | PRN
  Administered 2015-09-15: 4 mg via INTRAVENOUS

## 2015-09-15 MED ORDER — FENTANYL CITRATE (PF) 100 MCG/2ML IJ SOLN
25.0000 ug | INTRAMUSCULAR | Status: DC | PRN
  Administered 2015-09-15 (×2): 50 ug via INTRAVENOUS

## 2015-09-15 MED ORDER — OXYCODONE HCL 5 MG PO TABS
5.0000 mg | ORAL_TABLET | ORAL | Status: DC | PRN
  Administered 2015-09-15 – 2015-09-16 (×2): 10 mg via ORAL
  Filled 2015-09-15 (×3): qty 2

## 2015-09-15 MED ORDER — MIDAZOLAM HCL 5 MG/5ML IJ SOLN
INTRAMUSCULAR | Status: DC | PRN
  Administered 2015-09-15 (×2): 1 mg via INTRAVENOUS

## 2015-09-15 MED ORDER — ONDANSETRON 4 MG PO TBDP: 4.0000 mg | ORAL_TABLET | Freq: Four times a day (QID) | ORAL | Status: DC | PRN

## 2015-09-15 MED ORDER — FENTANYL CITRATE (PF) 250 MCG/5ML IJ SOLN
INTRAMUSCULAR | Status: AC
  Filled 2015-09-15: qty 5

## 2015-09-15 MED ORDER — HYDROCHLOROTHIAZIDE 12.5 MG PO CAPS
12.5000 mg | ORAL_CAPSULE | Freq: Every day | ORAL | Status: DC
Start: 2015-09-16 — End: 2015-09-16
  Administered 2015-09-16: 12.5 mg via ORAL
  Filled 2015-09-15: qty 1

## 2015-09-15 MED ORDER — HEMOSTATIC AGENTS (NO CHARGE) OPTIME
TOPICAL | Status: DC | PRN
  Administered 2015-09-15 (×2): 1

## 2015-09-15 MED ORDER — SODIUM CHLORIDE 0.9 % IV SOLN
INTRAVENOUS | Status: DC
  Administered 2015-09-15: 15:00:00 via INTRAVENOUS

## 2015-09-15 MED ORDER — METHOCARBAMOL 500 MG PO TABS
500.0000 mg | ORAL_TABLET | Freq: Four times a day (QID) | ORAL | Status: DC | PRN
  Administered 2015-09-15: 500 mg via ORAL
  Filled 2015-09-15: qty 1

## 2015-09-15 MED ORDER — ACETAMINOPHEN 500 MG PO TABS
1000.0000 mg | ORAL_TABLET | Freq: Four times a day (QID) | ORAL | Status: DC
  Administered 2015-09-15 – 2015-09-16 (×3): 1000 mg via ORAL
  Filled 2015-09-15 (×4): qty 2

## 2015-09-15 MED ORDER — LIDOCAINE HCL (CARDIAC) 20 MG/ML IV SOLN
INTRAVENOUS | Status: AC
  Filled 2015-09-15: qty 5

## 2015-09-15 MED ORDER — FENTANYL CITRATE (PF) 100 MCG/2ML IJ SOLN
INTRAMUSCULAR | Status: DC | PRN
  Administered 2015-09-15: 50 ug via INTRAVENOUS
  Administered 2015-09-15 (×2): 100 ug via INTRAVENOUS

## 2015-09-15 MED ORDER — MIDAZOLAM HCL 2 MG/2ML IJ SOLN
INTRAMUSCULAR | Status: AC
  Filled 2015-09-15: qty 2

## 2015-09-15 MED ORDER — MORPHINE SULFATE (PF) 2 MG/ML IV SOLN: 2.0000 mg | INTRAVENOUS | Status: DC | PRN

## 2015-09-15 MED ORDER — FENTANYL CITRATE (PF) 100 MCG/2ML IJ SOLN
INTRAMUSCULAR | Status: AC
  Administered 2015-09-15: 50 ug via INTRAVENOUS
  Filled 2015-09-15: qty 2

## 2015-09-15 MED ORDER — BUPIVACAINE-EPINEPHRINE (PF) 0.5% -1:200000 IJ SOLN
INTRAMUSCULAR | Status: DC | PRN
  Administered 2015-09-15: 30 mL

## 2015-09-15 MED ORDER — PROPOFOL 10 MG/ML IV BOLUS
INTRAVENOUS | Status: AC
  Filled 2015-09-15: qty 20

## 2015-09-15 MED ORDER — TECHNETIUM TC 99M SULFUR COLLOID FILTERED
1.0000 | Freq: Once | INTRAVENOUS | Status: AC | PRN
  Administered 2015-09-15: 1 via INTRADERMAL

## 2015-09-15 MED ORDER — PROMETHAZINE HCL 25 MG/ML IJ SOLN: 6.2500 mg | INTRAMUSCULAR | Status: DC | PRN

## 2015-09-15 MED ORDER — LIDOCAINE HCL (CARDIAC) 20 MG/ML IV SOLN
INTRAVENOUS | Status: DC | PRN
  Administered 2015-09-15: 80 mg via INTRAVENOUS

## 2015-09-15 MED ORDER — ROCURONIUM BROMIDE 50 MG/5ML IV SOLN
INTRAVENOUS | Status: AC
  Filled 2015-09-15: qty 1

## 2015-09-15 MED ORDER — CEFAZOLIN SODIUM-DEXTROSE 2-3 GM-% IV SOLR
2.0000 g | INTRAVENOUS | Status: AC
  Administered 2015-09-15: 2 g via INTRAVENOUS
  Filled 2015-09-15: qty 50

## 2015-09-15 MED ORDER — DEXAMETHASONE SODIUM PHOSPHATE 10 MG/ML IJ SOLN
INTRAMUSCULAR | Status: DC | PRN
  Administered 2015-09-15: 4 mg via INTRAVENOUS

## 2015-09-15 MED ORDER — LACTATED RINGERS IV SOLN
INTRAVENOUS | Status: DC
  Administered 2015-09-15: 10:00:00 via INTRAVENOUS

## 2015-09-15 MED ORDER — 0.9 % SODIUM CHLORIDE (POUR BTL) OPTIME
TOPICAL | Status: DC | PRN
  Administered 2015-09-15: 1000 mL

## 2015-09-15 MED ORDER — LACTATED RINGERS IV SOLN
INTRAVENOUS | Status: DC | PRN
  Administered 2015-09-15 (×2): via INTRAVENOUS

## 2015-09-15 MED ORDER — DEXTROSE 5 % IV SOLN
10.0000 mg | INTRAVENOUS | Status: DC | PRN
  Administered 2015-09-15: 10 ug/min via INTRAVENOUS

## 2015-09-15 MED ORDER — PROPOFOL 10 MG/ML IV BOLUS
INTRAVENOUS | Status: DC | PRN
  Administered 2015-09-15: 200 mg via INTRAVENOUS

## 2015-09-15 SURGICAL SUPPLY — 63 items
APL SKNCLS STERI-STRIP NONHPOA (GAUZE/BANDAGES/DRESSINGS) ×1
APPLIER CLIP 9.375 MED OPEN (MISCELLANEOUS) ×2
APR CLP MED 9.3 20 MLT OPN (MISCELLANEOUS) ×1
BENZOIN TINCTURE PRP APPL 2/3 (GAUZE/BANDAGES/DRESSINGS) ×2 IMPLANT
BINDER BREAST LRG (GAUZE/BANDAGES/DRESSINGS) IMPLANT
BINDER BREAST XLRG (GAUZE/BANDAGES/DRESSINGS) ×1 IMPLANT
BIOPATCH RED 1 DISK 7.0 (GAUZE/BANDAGES/DRESSINGS) ×1 IMPLANT
CANISTER SUCTION 2500CC (MISCELLANEOUS) ×2 IMPLANT
CHLORAPREP W/TINT 26ML (MISCELLANEOUS) ×2 IMPLANT
CLIP APPLIE 9.375 MED OPEN (MISCELLANEOUS) IMPLANT
CONT SPEC 4OZ CLIKSEAL STRL BL (MISCELLANEOUS) ×2 IMPLANT
COVER PROBE W GEL 5X96 (DRAPES) ×2 IMPLANT
COVER SURGICAL LIGHT HANDLE (MISCELLANEOUS) ×2 IMPLANT
DEVICE DISSECT PLASMABLAD 3.0S (MISCELLANEOUS) IMPLANT
DRAIN CHANNEL 19F RND (DRAIN) ×2 IMPLANT
DRAPE LAPAROSCOPIC ABDOMINAL (DRAPES) ×2 IMPLANT
DRSG TEGADERM 2-3/8X2-3/4 SM (GAUZE/BANDAGES/DRESSINGS) ×1 IMPLANT
DRSG TEGADERM 4X4.75 (GAUZE/BANDAGES/DRESSINGS) ×1 IMPLANT
ELECT BLADE 4.0 EZ CLEAN MEGAD (MISCELLANEOUS) ×2
ELECT CAUTERY BLADE 6.4 (BLADE) ×2 IMPLANT
ELECT REM PT RETURN 9FT ADLT (ELECTROSURGICAL) ×2
ELECTRODE BLDE 4.0 EZ CLN MEGD (MISCELLANEOUS) ×1 IMPLANT
ELECTRODE REM PT RTRN 9FT ADLT (ELECTROSURGICAL) ×1 IMPLANT
EVACUATOR SILICONE 100CC (DRAIN) ×2 IMPLANT
GAUZE SPONGE 4X4 12PLY STRL (GAUZE/BANDAGES/DRESSINGS) ×2 IMPLANT
GLOVE BIO SURGEON STRL SZ7 (GLOVE) ×3 IMPLANT
GLOVE BIO SURGEON STRL SZ7.5 (GLOVE) ×1 IMPLANT
GLOVE BIOGEL PI IND STRL 7.0 (GLOVE) IMPLANT
GLOVE BIOGEL PI IND STRL 7.5 (GLOVE) ×1 IMPLANT
GLOVE BIOGEL PI INDICATOR 7.0 (GLOVE) ×1
GLOVE BIOGEL PI INDICATOR 7.5 (GLOVE) ×2
GLOVE ECLIPSE 7.5 STRL STRAW (GLOVE) ×1 IMPLANT
GOWN STRL REUS W/ TWL LRG LVL3 (GOWN DISPOSABLE) ×3 IMPLANT
GOWN STRL REUS W/TWL LRG LVL3 (GOWN DISPOSABLE) ×6
KIT BASIN OR (CUSTOM PROCEDURE TRAY) ×2 IMPLANT
KIT ROOM TURNOVER OR (KITS) ×2 IMPLANT
LIQUID BAND (GAUZE/BANDAGES/DRESSINGS) ×2 IMPLANT
MARKER SKIN DUAL TIP RULER LAB (MISCELLANEOUS) ×2 IMPLANT
NDL 18GX1X1/2 (RX/OR ONLY) (NEEDLE) IMPLANT
NDL HYPO 25GX1X1/2 BEV (NEEDLE) IMPLANT
NEEDLE 18GX1X1/2 (RX/OR ONLY) (NEEDLE) IMPLANT
NEEDLE HYPO 25GX1X1/2 BEV (NEEDLE) IMPLANT
NS IRRIG 1000ML POUR BTL (IV SOLUTION) ×2 IMPLANT
PACK GENERAL/GYN (CUSTOM PROCEDURE TRAY) ×2 IMPLANT
PAD ARMBOARD 7.5X6 YLW CONV (MISCELLANEOUS) ×2 IMPLANT
PIN SAFETY STERILE (MISCELLANEOUS) ×2 IMPLANT
PLASMABLADE 3.0S (MISCELLANEOUS)
SPECIMEN JAR X LARGE (MISCELLANEOUS) ×2 IMPLANT
SPONGE LAP 18X18 X RAY DECT (DISPOSABLE) ×2 IMPLANT
STAPLER VISISTAT 35W (STAPLE) ×2 IMPLANT
STRIP CLOSURE SKIN 1/2X4 (GAUZE/BANDAGES/DRESSINGS) ×3 IMPLANT
SUT ETHILON 2 0 FS 18 (SUTURE) ×2 IMPLANT
SUT MNCRL AB 4-0 PS2 18 (SUTURE) ×2 IMPLANT
SUT MON AB 4-0 PC3 18 (SUTURE) ×2 IMPLANT
SUT SILK 2 0 SH (SUTURE) IMPLANT
SUT VIC AB 3-0 54X BRD REEL (SUTURE) ×1 IMPLANT
SUT VIC AB 3-0 BRD 54 (SUTURE) ×2
SUT VIC AB 3-0 SH 18 (SUTURE) ×3 IMPLANT
SUT VIC AB 3-0 SH 8-18 (SUTURE) ×3 IMPLANT
SYR CONTROL 10ML LL (SYRINGE) IMPLANT
TOWEL OR 17X24 6PK STRL BLUE (TOWEL DISPOSABLE) ×2 IMPLANT
TOWEL OR 17X26 10 PK STRL BLUE (TOWEL DISPOSABLE) ×2 IMPLANT
TUBE CONNECTING 12X1/4 (SUCTIONS) IMPLANT

## 2015-09-15 NOTE — Anesthesia Postprocedure Evaluation (Signed)
Anesthesia Post Note  Patient: Nichole Wilson  Procedure(s) Performed: Procedure(s) (LRB): RIGHT MASTECTOMY WITH SENTINEL LYMPH NODE BIOPSY (Right)  Patient location during evaluation: PACU Anesthesia Type: General Level of consciousness: awake and alert Pain management: pain level controlled Vital Signs Assessment: post-procedure vital signs reviewed and stable Respiratory status: spontaneous breathing, nonlabored ventilation, respiratory function stable and patient connected to nasal cannula oxygen Cardiovascular status: blood pressure returned to baseline and stable Postop Assessment: no signs of nausea or vomiting Anesthetic complications: no    Last Vitals:  Filed Vitals:   09/15/15 1430 09/15/15 1456  BP:  123/80  Pulse: 85 93  Temp: 36.8 C 37.1 C  Resp: 13 17    Last Pain:  Filed Vitals:   09/15/15 1457  PainSc: 2                  Zenaida Deed

## 2015-09-15 NOTE — Discharge Instructions (Signed)
[facility] °[phone] ° °MASTECTOMY: POST OP INSTRUCTIONS ° °Always review your discharge instruction sheet given to you by the facility where your surgery was performed. °IF YOU HAVE DISABILITY OR FAMILY LEAVE FORMS, YOU MUST BRING THEM TO THE OFFICE FOR PROCESSING.   °DO NOT GIVE THEM TO YOUR DOCTOR. °A prescription for pain medication may be given to you upon discharge.  Take your pain medication as prescribed, if needed.  If narcotic pain medicine is not needed, then you may take acetaminophen (Tylenol), naprosyn (Alleve) or ibuprofen (Advil) as needed. °1. Take your usually prescribed medications unless otherwise directed. °2. If you need a refill on your pain medication, please contact your pharmacy.  They will contact our office to request authorization.  Prescriptions will not be filled after 5pm or on week-ends. °3. You should follow a light diet the first few days after arrival home, such as soup and crackers, etc.  Resume your normal diet the day after surgery. °4. Most patients will experience some swelling and bruising on the chest and underarm.  Ice packs will help.  Swelling and bruising can take several days to resolve. Wear the binder day and night until you return to the office.  °5. It is common to experience some constipation if taking pain medication after surgery.  Increasing fluid intake and taking a stool softener (such as Colace) will usually help or prevent this problem from occurring.  A mild laxative (Milk of Magnesia or Miralax) should be taken according to package instructions if there are no bowel movements after 48 hours. °6. Unless discharge instructions indicate otherwise, leave your bandage dry and in place until your next appointment in 3-5 days.  You may take a limited sponge bath.  No tube baths or showers until the drains are removed.  You may have steri-strips (small skin tapes) in place directly over the incision.  These strips should be left on the  skin for 7-10 days. If you have glue it will come off in next couple week.  Any sutures will be removed at an office visit °7. DRAINS:  If you have drains in place, it is important to keep a list of the amount of drainage produced each day in your drains.  Before leaving the hospital, you should be instructed on drain care.  Call our office if you have any questions about your drains. I will remove your drains when they put out less than 30 cc or ml for 2 consecutive days. °8. ACTIVITIES:  You may resume regular (light) daily activities beginning the next day--such as daily self-care, walking, climbing stairs--gradually increasing activities as tolerated.  You may have sexual intercourse when it is comfortable.  Refrain from any heavy lifting or straining until approved by your doctor. °a. You may drive when you are no longer taking prescription pain medication, you can comfortably wear a seatbelt, and you can safely maneuver your car and apply brakes. °b. RETURN TO WORK:  __________________________________________________________ °9. You should see your doctor in the office for a follow-up appointment approximately 3-5 days after your surgery.  Your doctor’s nurse will typically make your follow-up appointment when she calls you with your pathology report.  Expect your pathology report 3-4business days after surgery. °10. OTHER INSTRUCTIONS: ______________________________________________________________________________________________ ____________________________________________________________________________________________ °WHEN TO CALL YOUR DR Nichole Wilson: °1. Fever over 101.0 °2. Nausea and/or vomiting °3. Extreme swelling or bruising °4. Continued bleeding from incision. °5. Increased pain, redness, or drainage from the incision. °The clinic staff is available   to answer your questions during regular business hours.  Please don’t hesitate to call and ask to speak to one of the nurses for clinical concerns.  If  you have a medical emergency, go to the nearest emergency room or call 911.  A surgeon from [facility] is always on call at the hospital. °[address], [address], [place], [place]  [address] ? [address] [address], [place], [place]   [address] °[phone] ? [phone] ? FAX [phone] °Web site: [email or web address] ° °

## 2015-09-15 NOTE — Transfer of Care (Signed)
Immediate Anesthesia Transfer of Care Note  Patient: Nichole Wilson  Procedure(s) Performed: Procedure(s): RIGHT MASTECTOMY WITH SENTINEL LYMPH NODE BIOPSY (Right)  Patient Location: PACU  Anesthesia Type:General  Level of Consciousness: awake, sedated and patient cooperative  Airway & Oxygen Therapy: Patient Spontanous Breathing and Patient connected to face mask oxygen  Post-op Assessment: Report given to RN, Post -op Vital signs reviewed and stable and Patient moving all extremities  Post vital signs: Reviewed and stable  Last Vitals:  Filed Vitals:   09/15/15 0919  BP: 142/90  Pulse: 86  Temp: 36.7 C  Resp: 20    Complications: No apparent anesthesia complications

## 2015-09-15 NOTE — Anesthesia Preprocedure Evaluation (Signed)
Anesthesia Evaluation  Patient identified by MRN, date of birth, ID band Patient awake    Reviewed: Allergy & Precautions, H&P , NPO status , Patient's Chart, lab work & pertinent test results  History of Anesthesia Complications (+) PONV and history of anesthetic complications  Airway Mallampati: I  TM Distance: >3 FB Neck ROM: Full    Dental  (+) Teeth Intact, Dental Advisory Given   Pulmonary neg pulmonary ROS,    Pulmonary exam normal breath sounds clear to auscultation       Cardiovascular Exercise Tolerance: Good hypertension, (-) CAD, (-) Past MI, (-) CHF and (-) DVT Normal cardiovascular exam Rhythm:Regular Rate:Normal     Neuro/Psych  Headaches, negative psych ROS   GI/Hepatic negative GI ROS, Fatty liver   Endo/Other  diabetes  Renal/GU negative Renal ROS     Musculoskeletal negative musculoskeletal ROS (+)   Abdominal   Peds  Hematology negative hematology ROS (+)   Anesthesia Other Findings   Reproductive/Obstetrics                             Anesthesia Physical  Anesthesia Plan  ASA: II  Anesthesia Plan: General and Regional   Post-op Pain Management:    Induction: Intravenous  Airway Management Planned: Oral ETT  Additional Equipment:   Intra-op Plan:   Post-operative Plan: Extubation in OR  Informed Consent: I have reviewed the patients History and Physical, chart, labs and discussed the procedure including the risks, benefits and alternatives for the proposed anesthesia with the patient or authorized representative who has indicated his/her understanding and acceptance.   Dental advisory given  Plan Discussed with: CRNA and Surgeon  Anesthesia Plan Comments:         Anesthesia Quick Evaluation

## 2015-09-15 NOTE — Op Note (Signed)
Preoperative diagnosis: right breast cancer clinical stage I Postoperative diagnosis: Same as above Procedure: Right total mastectomy, Right axillary sentinel node biopsy Surgeon: Dr. Serita Grammes Anesthesia: Gen. With pectoral block Specimens: Right breast tissue and axillary tissue marked short superior and long stitch with axillary tail, Right axillary sentinel node biopsy with highest count of [id number] Complications: None Drains: 19 French Blake drain Estimated blood loss: 75 mL Sponge count was correct at completion Disposition to recovery in stable condition  Indications: This is a 64 yof with newly diagnosed right breast cancer. She has elected to undergo right mastectomy and sn biopsy. She has elected not to undergo reconstruction.   Procedure: After informed consent was obtained she was taken to the operating room. She had a pectoral block. She was injected with technetium in the standard periareolar fashion. She was administered antibiotics. Sequential compression devices were on her legs. She was then placed under general anesthesia without complication. Her right breast and axilla were then prepped and draped in the standard sterile surgical fashion. A surgical timeout was then performed.   I made an elliptical incision that encompassed the nipple areola and a fair amount of her skin. I then created flaps to the clavicle, parasternal area, inframammary crease, and the latissimus laterally. I then removed the breast tissue from the pectoralis muscle including the pectoralis fascia and rolled this laterally. I then entered into her axilla. I was able to identify a sentinel node with counts as above. Hemostasis was observed. There was no palpable tissue in the axilla. I then marked this as above. I passed this off the table as a specimen. I then obtained hemostasis. I irrigated.I placed Arista throughout the cavity. A 19 Fr Blake drain was placed and was secured with a 2-0 nylon suture. I  then closed the incision with a 3-0 Vicryl. I then closed all of the skin with a 4-0 Monocryl.I did remove an additional portion of tissue at lateral aspect and closed this in a y fashion.  I then placed glue and Steri-Strips. A breast binder was placed. She tolerated this well was extubated and transferred to recovery stable.

## 2015-09-15 NOTE — H&P (Signed)
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 She has noted for about a year some mild right nipple retraction. She has no discharge. she notes no mass. her density is B. Her initial mm showed a small area of calcs present in right uoq. there is some right nipple retraction also. the left breast is negative. she underwent more views that had a 12 mm area of calcs present. the nipple retraction was less conspicuous. targeted right retroareolar [place] is negative. MRI shows an 8x5x7 mm mass in right uoq. there is no abnormaity in the right nipple area. the left breast is negative as are all nodes. She underwent mr biopsy that shows a er pos, pr neg, Ki 5% her 2 positive idc with dcis, grade II. on reevaluation with post clip mm there were seen to be additional calcs that measure 4 cm in total, extending 2 cm anterior to the clip and 1.5 cm posterior. biopsy is recommended. she is here today to discuss options   Other Problems Nichole Wilson, [place]; 08/25/2015 4:40 PM) High blood pressure  Past Surgical History Nichole Wilson, [place]; 08/25/2015 4:40 PM) Cesarean Section - 1 Gallbladder Surgery - Laparoscopic  Diagnostic Studies History Nichole Wilson, CMA; 08/25/2015 4:40 PM) Mammogram 1-3 years ago  Allergies Nichole Wilson, CMA; 08/25/2015 4:40 PM) No Known Drug Allergies02/02/2016  Medication History Nichole Wilson, CMA; 08/25/2015 4:40 PM) MetFORMIN HCl (500MG  Tablet, Oral daily) Active. Pravastatin Sodium (40MG  Tablet, Oral daily) Active. Fenofibrate (160MG  Tablet, Oral daily) Active. Vitamin C (500MG  Tablet, Oral daily) Active. Vitamin E (1000UNIT Capsule, Oral daily) Active. Multivitamin (Oral daily) Active. Medications Reconciled  Social History Nichole Wilson, [place]; 08/25/2015 4:40 PM) Alcohol use Occasional alcohol use. No caffeine use No drug use Tobacco use Never smoker.  Family History Nichole Wilson, [place]; 08/25/2015  4:40 PM) Hypertension Mother.  Pregnancy / Birth History Nichole Wilson, [place]; 08/25/2015 4:40 PM) Age at menarche 48 years. Age of menopause <47 Gravida 2 Maternal age 63-35 Para 2    Review of Systems ([place]; 08/25/2015 4:40 PM) General Not Present- Appetite Loss, Chills, Fatigue, Fever, Night Sweats, Weight Gain and Weight Loss. Skin Not Present- Change in Wart/Mole, Dryness, Hives, Jaundice, New Lesions, Non-Healing Wounds, Rash and Ulcer. HEENT Not Present- Earache, Hearing Loss, Hoarseness, Nose Bleed, Oral Ulcers, Ringing in the Ears, Seasonal Allergies, Sinus Pain, Sore Throat, Visual Disturbances, Wears glasses/contact lenses and Yellow Eyes. Respiratory Not Present- Bloody sputum, Chronic Cough, Difficulty Breathing, Snoring and Wheezing. Breast Not Present- Breast Mass, Breast Pain, Nipple Discharge and Skin Changes. Psychiatric Not Present- Anxiety, Bipolar, Change in Sleep Pattern, Depression, Fearful and Frequent crying.  Vitals Jearld Fenton Morris CMA; 08/25/2015 4:41 PM) 08/25/2015 4:41 PM Weight: 155.8 lb Height: 62in Body Surface Area: 1.72 m Body Mass Index: 28.5 kg/m  Temp.: 98.26F(Oral)  Pulse: 96 (Regular)  BP: 118/76 (Sitting, Left Arm, Standard) Physical Exam Rolm Bookbinder MD; 08/25/2015 5:23 PM) General Mental Status-Alert. Orientation-Oriented X3. Chest and Lung Exam Chest and lung exam reveals -on auscultation, normal breath sounds, no adventitious sounds and normal vocal resonance. Breast Nipples-No Discharge. Breast Lump-No Palpable Breast Mass. Note: right nipple partially retracted, no abnormality Cardiovascular Cardiovascular examination reveals -normal heart sounds, regular rate and rhythm with no murmurs. Lymphatic Head & Neck General Head & Neck Lymphatics: Bilateral - Description - Normal. Axillary General Axillary Region: Bilateral - Description - Normal. Note: no Barnard adenopathy   Assessment & Plan  Rolm Bookbinder MD; 08/26/2015 4:51 PM) BREAST CANCER  OF UPPER-OUTER QUADRANT OF RIGHT FEMALE BREAST (C50.411) Story: Right total mastectomy, right axillary sentinel node biopsy  We discussed the staging and pathophysiology of breast cancer. We discussed all of the different options for treatment for breast cancer including surgery, chemotherapy, radiation therapy, Herceptin, and antiestrogen therapy. We discussed a sentinel lymph node biopsy as she does not appear to having lymph node involvement right now. We discussed the performance of that with injection of radioactive tracer. We discussed that there is a chance of having a positive node with a sentinel lymph node biopsy and we will await the permanent pathology to make any other first further decisions in terms of her treatment. We discussed up to a 5% risk lifetime of chronic shoulder pain as well as lymphedema associated with a sentinel lymph node biopsy. We discussed the options for treatment of the breast cancer which included lumpectomy versus a mastectomy. We also discussed that she will likely need radiation therapy if she undergoes lumpectomy. We discussed the mastectomy (removal of whole breast) and the postoperative care for that as well. Mastectomy can be followed by reconstruction. This is a more extensive surgery and requires more recovery. The decision for lumpectomy vs mastectomy has no impact on decision for chemotherapy. Most mastectomy patients will not need radiation therapy. We discussed that there is no difference in her survival whether she undergoes lumpectomy with radiation therapy or antiestrogen therapy versus a mastectomy. There is also no real difference between her recurrence in the breast. I think this larger area will require mastectomy. she does not want reconstruction or consideration of nsm. We discussed the risks of operation including bleeding, infection, possible reoperation. She understands her further therapy will  be based on what her stages at the time of her operation.

## 2015-09-15 NOTE — Interval H&P Note (Signed)
History and Physical Interval Note: She also has a gastric mass on ct scan that will undergo endoscopy in near future with Dr Watt Climes. We have decided to proceed today.  09/15/2015 10:15 AM  Nichole Wilson  has presented today for surgery, with the diagnosis of RIGHT BREAST CANCER  The various methods of treatment have been discussed with the patient and family. After consideration of risks, benefits and other options for treatment, the patient has consented to  Procedure(s): MASTECTOMY WITH SENTINEL LYMPH NODE BIOPSY (Right) as a surgical intervention .  The patient's history has been reviewed, patient examined, no change in status, stable for surgery.  I have reviewed the patient's chart and labs.  Questions were answered to the patient's satisfaction.     Conrado Nance

## 2015-09-15 NOTE — Anesthesia Procedure Notes (Addendum)
Anesthesia Regional Block:  Pectoralis block  Pre-Anesthetic Checklist: ,, timeout performed, Correct Patient, Correct Site, Correct Laterality, Correct Procedure, Correct Position, site marked, Risks and benefits discussed,  Surgical consent,  Pre-op evaluation,  At surgeon's request and post-op pain management  Laterality: Right  Prep: chloraprep       Needles:  Injection technique: Single-shot  Needle Type: Echogenic Stimulator Needle     Needle Length: 9cm 9 cm Needle Gauge: 21 and 21 G    Additional Needles:  Procedures: ultrasound guided (picture in chart) Pectoralis block Narrative:  Injection made incrementally with aspirations every 5 mL.  Performed by: Personally  Anesthesiologist: JUDD, BENJAMIN  Additional Notes: Risks, benefits and alternative to block explained extensively.  Patient tolerated procedure well, without complications.   Procedure Name: LMA Insertion Date/Time: 09/15/2015 11:20 AM Performed by: Izora Gala Pre-anesthesia Checklist: Patient identified, Emergency Drugs available, Suction available and Patient being monitored Patient Re-evaluated:Patient Re-evaluated prior to inductionOxygen Delivery Method: Circle system utilized Preoxygenation: Pre-oxygenation with 100% oxygen Intubation Type: IV induction Ventilation: Mask ventilation without difficulty LMA: LMA inserted LMA Size: 4.0 Number of attempts: 1 Placement Confirmation: positive ETCO2 Tube secured with: Tape Dental Injury: Teeth and Oropharynx as per pre-operative assessment

## 2015-09-16 ENCOUNTER — Encounter (HOSPITAL_COMMUNITY): Payer: Self-pay | Admitting: General Surgery

## 2015-09-16 ENCOUNTER — Telehealth: Payer: Self-pay | Admitting: Hematology and Oncology

## 2015-09-16 DIAGNOSIS — E119 Type 2 diabetes mellitus without complications: Secondary | ICD-10-CM | POA: Diagnosis not present

## 2015-09-16 DIAGNOSIS — Z7982 Long term (current) use of aspirin: Secondary | ICD-10-CM | POA: Diagnosis not present

## 2015-09-16 DIAGNOSIS — Z7984 Long term (current) use of oral hypoglycemic drugs: Secondary | ICD-10-CM | POA: Diagnosis not present

## 2015-09-16 DIAGNOSIS — D0511 Intraductal carcinoma in situ of right breast: Secondary | ICD-10-CM | POA: Diagnosis not present

## 2015-09-16 DIAGNOSIS — I10 Essential (primary) hypertension: Secondary | ICD-10-CM | POA: Diagnosis not present

## 2015-09-16 MED ORDER — HYDROCODONE-ACETAMINOPHEN 5-325 MG PO TABS
1.0000 | ORAL_TABLET | Freq: Four times a day (QID) | ORAL | Status: DC | PRN
  Administered 2015-09-16: 2 via ORAL
  Filled 2015-09-16: qty 2

## 2015-09-16 MED ORDER — HYDROCODONE-ACETAMINOPHEN 5-325 MG PO TABS: 1.0000 | ORAL_TABLET | Freq: Four times a day (QID) | ORAL | Status: DC | PRN

## 2015-09-16 MED ORDER — OXYCODONE HCL 5 MG PO TABS: 5.0000 mg | ORAL_TABLET | ORAL | Status: DC | PRN

## 2015-09-16 MED ORDER — METHOCARBAMOL 500 MG PO TABS
500.0000 mg | ORAL_TABLET | Freq: Four times a day (QID) | ORAL | Status: DC | PRN
Start: 2015-09-16 — End: 2015-10-01

## 2015-09-16 MED FILL — METHOCARBAMOL 500 MG TABLET: 500 | 3 days supply | Qty: 15 | Fill #0

## 2015-09-16 MED FILL — HYDROCODON-APAP 5-325: 5-325 | 3 days supply | Qty: 30 | Fill #0

## 2015-09-16 NOTE — Telephone Encounter (Signed)
Left message for pt to inform her of appt per 2/28 pof. Unable to schedule appt on 3/8 due to MD schedule is booked

## 2015-09-16 NOTE — Progress Notes (Signed)
1 Day Post-Op  Subjective: No complaints  Objective: Vital signs in last 24 hours: Temp:  [98 F (36.7 C)-98.7 F (37.1 C)] 98.3 F (36.8 C) (03/02 0554) Pulse Rate:  [85-100] 90 (03/02 0554) Resp:  [10-18] 18 (03/02 0554) BP: (102-130)/(63-90) 107/66 mmHg (03/02 0554) SpO2:  [98 %-100 %] 99 % (03/02 0554) Last BM Date: 09/14/15  Intake/Output from previous day: 03/01 0701 - 03/02 0700 In: 2706.5 [P.O.:840; I.V.:1816.5; IV Piggyback:50] Out: [id number] [Urine:1200; Drains:245; Blood:150] Intake/Output this shift: Total I/O In: 150 [I.V.:150] Out: 450 [Urine:450]  Right mastectomy incision clean without infection, no hematoma, some swelling, drain is serosang  Lab Results:   Recent Labs  09/14/15 0932  WBC 5.2  HGB 13.3  HCT 39.2  PLT 290   BMET  Recent Labs  09/14/15 0932  NA 143  K 3.9  CL 108  CO2 25  GLUCOSE 133*  BUN 14  CREATININE 0.62  CALCIUM 9.7    Studies/Results: Nm Sentinel Node Inj-no Rpt (breast)  09/15/2015  CLINICAL DATA: right axillary sn biopsy Sulfur colloid was injected intradermally by the nuclear medicine technologist for breast cancer sentinel node localization.    Anti-infectives: Anti-infectives    Start     Dose/Rate Route Frequency Ordered Stop   09/15/15 1015  ceFAZolin (ANCEF) IVPB 2 g/50 mL premix  Status:  Discontinued     2 g 100 mL/hr over 30 Minutes Intravenous To [facility] 09/15/15 1001 09/15/15 1448   09/15/15 0930  ceFAZolin (ANCEF) IVPB 2 g/50 mL premix     2 g 100 mL/hr over 30 Minutes Intravenous To [facility] 09/15/15 0917 09/15/15 1154      Assessment/Plan: POD 1 right mastectomy/sn biopsy  Po pain meds, mm relaxant Ambulate If does well with lunch can go home later  [facility] 09/16/2015

## 2015-09-16 NOTE — Discharge Planning (Signed)
Reviewed discharge instructions with patient and husband. They verbalize understanding of all discharge instructions, including home medications, follow up appointments, JP drain care and incisional care.

## 2015-09-20 NOTE — Discharge Summary (Signed)
Physician Discharge Summary  Patient ID: Nichole Wilson MRN: [id number] DOB/AGE: [id number] 78 y.o.  Admit date: 09/15/2015 Discharge date: 09/20/2015  Admission Diagnoses: 1. Breast cancer 2. Hypertension 3. diabetes  Discharge Diagnoses:  Active Problems:   Breast cancer [facility])   Discharged Condition: good  Hospital Course: 51 yof who was admitted and underwent right mastectomy with sn biopsy. She did well overnight with expected drain output and was discharged the following day  Consults:none  Significant Diagnostic Studies: none  Treatments: surgery   Disposition: 01-Home or Self Care  Discharge Instructions    Nursing communication    Complete by:  As directed   If tolerates lunch and doing well call me and we can discharge            Medication List    TAKE these medications        aspirin 81 MG tablet  Take 81 mg by mouth daily.     fenofibrate 160 MG tablet  Take 160 mg by mouth daily.     hydrochlorothiazide 12.5 MG tablet  Commonly known as:  HYDRODIURIL  Take 1 tablet (12.5 mg total) by mouth daily.     hydrochlorothiazide 12.5 MG capsule  Commonly known as:  MICROZIDE  Take 1 capsule by mouth daily.     HYDROcodone-acetaminophen 5-325 MG tablet  Commonly known as:  NORCO/VICODIN  Take 1-2 tablets by mouth every 6 (six) hours as needed for moderate pain or severe pain.     Icosapent Ethyl 1 g Caps  Take 2 g by mouth 2 (two) times daily with a meal.     metFORMIN 500 MG tablet  Commonly known as:  GLUCOPHAGE  Take 1 tablet (500 mg total) by mouth 2 (two) times daily with a meal. Start 1 tab nightly for 1 week, then increase to twice daily.     methocarbamol 500 MG tablet  Commonly known as:  ROBAXIN  Take 1 tablet (500 mg total) by mouth every 6 (six) hours as needed for muscle spasms.     multivitamin with minerals Tabs tablet  Take 1 tablet by mouth daily.           Follow-up Information    Follow up with [facility], MD In 1  week.   Specialty:  General Surgery   Contact information:   [address] [place] [address] [phone]       Signed: Rolm Bookbinder 09/20/2015, 10:07 AM

## 2015-09-20 NOTE — Assessment & Plan Note (Addendum)
Rt mastectomy 09/15/15: IDC Grade 2, 0.18 cm, with DCIS, 0/1 LN, ER 100%, PR 0%, HER2 Positive 4.52, T1aN0M0 (Stage 1A) Final tumor size: 0.35 cm on biopsy +0.18 cm on final lumpectomy equals 0.53 cm  Pathology counseling: I discussed the final pathology report of the patient provided  a copy of this report. I discussed the margins as well as lymph node surgeries. We also discussed the final staging along with previously performed ER/PR and HER-2/neu testing.  Plan: 1. Based on the final tumor size 0.53 cm a discussed the pros and cons of anti-HER-2 treatment. After much discussion we decided that she could be treated for a short interval with Herceptin every 3 weeks X 4 cycles (short duration of Herceptin was used in [facility] trial) 2. Adj Anti estrogen therapy with anastrozole 1 mg daily  Echocardiogram will be ordered. We will do Herceptin through peripheral vein. Plan to start Herceptin with anastrozole on 10/12/2015

## 2015-09-21 ENCOUNTER — Ambulatory Visit (HOSPITAL_BASED_OUTPATIENT_CLINIC_OR_DEPARTMENT_OTHER): Payer: 59 | Admitting: Hematology and Oncology

## 2015-09-21 ENCOUNTER — Encounter: Payer: Self-pay | Admitting: *Deleted

## 2015-09-21 ENCOUNTER — Telehealth: Payer: Self-pay | Admitting: Hematology and Oncology

## 2015-09-21 ENCOUNTER — Other Ambulatory Visit: Payer: Self-pay | Admitting: *Deleted

## 2015-09-21 ENCOUNTER — Encounter: Payer: Self-pay | Admitting: Hematology and Oncology

## 2015-09-21 ENCOUNTER — Other Ambulatory Visit (HOSPITAL_COMMUNITY): Payer: Self-pay | Admitting: *Deleted

## 2015-09-21 ENCOUNTER — Other Ambulatory Visit: Payer: Self-pay | Admitting: Hematology and Oncology

## 2015-09-21 VITALS — BP 133/82 | HR 92 | Temp 97.5°F | Resp 18 | Ht 62.0 in | Wt 149.4 lb

## 2015-09-21 DIAGNOSIS — C50411 Malignant neoplasm of upper-outer quadrant of right female breast: Secondary | ICD-10-CM

## 2015-09-21 DIAGNOSIS — C50911 Malignant neoplasm of unspecified site of right female breast: Secondary | ICD-10-CM | POA: Diagnosis not present

## 2015-09-21 NOTE — Progress Notes (Signed)
Patient Care Team: Olin Hauser, DO as PCP - General (Osteopathic Medicine)  SUMMARY OF ONCOLOGIC HISTORY:   Breast cancer of upper-outer quadrant of right female breast ([place])   08/10/2015 Breast MRI Enhancing mass in right breast upper outer quadrant 8 x 5 x 7 mm, no retroareolar right breast abnormality, no abnormality of the site of nipple retraction   08/18/2015 Initial Biopsy Right breast biopsy: Invasive ductal carcinoma with DCIS, grade 2 with focal necrosis,ER 100%, PR 0%, Ki-67 5%,HER-2 positive ratio 4.52   08/19/2015 Mammogram Right breast calcifications now seem to span 4 x 2 x 1.5 cm   09/15/2015 Surgery Rt mastectomy: IDC Grade 2, 0.18 cm, with DCIS, 0/1 LN, ER 100%, PR 0%, HER2 Positive 4.52, T1aN0M0 (Stage 1A)    CHIEF COMPLIANT: Follow-up after recent right mastectomy  INTERVAL HISTORY: Nichole Wilson is a 55 year old with above-mentioned history of right breast cancer with DCIS underwent right mastectomy on 09/15/2015. She is here for a follow-up. She reports moderate discomfort from the site of surgery. She has been taking Tylenol for pain. She denies any nausea vomiting. She is anxious to hear the results of pathology.  REVIEW OF SYSTEMS:   Constitutional: Denies fevers, chills or abnormal weight loss Eyes: Denies blurriness of vision Ears, nose, mouth, throat, and face: Denies mucositis or sore throat Respiratory: Denies cough, dyspnea or wheezes Cardiovascular: Denies palpitation, chest discomfort Gastrointestinal:  Denies nausea, heartburn or change in bowel habits Skin: Denies abnormal skin rashes Lymphatics: Denies new lymphadenopathy or easy bruising Neurological:Denies numbness, tingling or new weaknesses Behavioral/Psych: Mood is stable, no new changes  Extremities: No lower extremity edema Breast: Pain from recent right mastectomy. All other systems were reviewed with the patient and are negative.  I have reviewed the past medical history, past  surgical history, social history and family history with the patient and they are unchanged from previous note.  ALLERGIES:  has No Known Allergies.  MEDICATIONS:  Current Outpatient Prescriptions  Medication Sig Dispense Refill  . aspirin 81 MG tablet Take 81 mg by mouth daily.    . fenofibrate 160 MG tablet Take 160 mg by mouth daily.    . hydrochlorothiazide (HYDRODIURIL) 12.5 MG tablet Take 1 tablet (12.5 mg total) by mouth daily. 90 tablet 3  . hydrochlorothiazide (MICROZIDE) 12.5 MG capsule Take 1 capsule by mouth daily.  3  . HYDROcodone-acetaminophen (NORCO/VICODIN) 5-325 MG tablet Take 1-2 tablets by mouth every 6 (six) hours as needed for moderate pain or severe pain. 30 tablet 0  . Icosapent Ethyl 1 g CAPS Take 2 g by mouth 2 (two) times daily with a meal. 120 capsule 5  . metFORMIN (GLUCOPHAGE) 500 MG tablet Take 1 tablet (500 mg total) by mouth 2 (two) times daily with a meal. Start 1 tab nightly for 1 week, then increase to twice daily. (Patient taking differently: Take 500 mg by mouth 2 (two) times daily with a meal. Take 1 tablet by mouth 2 times daily) 60 tablet 3  . methocarbamol (ROBAXIN) 500 MG tablet Take 1 tablet (500 mg total) by mouth every 6 (six) hours as needed for muscle spasms. 15 tablet 0  . Multiple Vitamin (MULTIVITAMIN WITH MINERALS) TABS Take 1 tablet by mouth daily.     No current facility-administered medications for this visit.    PHYSICAL EXAMINATION: ECOG PERFORMANCE STATUS: 1 - Symptomatic but completely ambulatory  Filed Vitals:   09/21/15 0922  BP: 133/82  Pulse: 92  Temp: 97.5 F (36.4 C)  Resp: 18   Filed Weights   09/21/15 0922  Weight: 149 lb 6.4 oz (67.767 kg)    GENERAL:alert, no distress and comfortable SKIN: skin color, texture, turgor are normal, no rashes or significant lesions EYES: normal, Conjunctiva are pink and non-injected, sclera clear OROPHARYNX:no exudate, no erythema and lips, buccal mucosa, and tongue normal  NECK:  supple, thyroid normal size, non-tender, without nodularity LYMPH:  no palpable lymphadenopathy in the cervical, axillary or inguinal LUNGS: clear to auscultation and percussion with normal breathing effort HEART: regular rate & rhythm and no murmurs and no lower extremity edema ABDOMEN:abdomen soft, non-tender and normal bowel sounds MUSCULOSKELETAL:no cyanosis of digits and no clubbing  NEURO: alert & oriented x 3 with fluent speech, no focal motor/sensory deficits EXTREMITIES: No lower extremity edema  LABORATORY DATA:  I have reviewed the data as listed   Chemistry      Component Value Date/Time   NA 143 09/14/2015 0932   K 3.9 09/14/2015 0932   CL 108 09/14/2015 0932   CO2 25 09/14/2015 0932   BUN 14 09/14/2015 0932   CREATININE 0.62 09/14/2015 0932   CREATININE 0.52 05/12/2015 1117      Component Value Date/Time   CALCIUM 9.7 09/14/2015 0932   ALKPHOS 71 09/14/2015 0932   AST 32 09/14/2015 0932   ALT 36 09/14/2015 0932   BILITOT 0.6 09/14/2015 0932       Lab Results  Component Value Date   WBC 5.2 09/14/2015   HGB 13.3 09/14/2015   HCT 39.2 09/14/2015   MCV 92.2 09/14/2015   PLT 290 09/14/2015   NEUTROABS 2.9 09/14/2015     ASSESSMENT & PLAN:  Breast cancer of upper-outer quadrant of right female breast ([place]) Rt mastectomy 09/15/15: IDC Grade 2, 0.18 cm, with DCIS, 0/1 LN, ER 100%, PR 0%, HER2 Positive 4.52, T1aN0M0 (Stage 1A) Final tumor size: 0.35 cm on biopsy +0.18 cm on final lumpectomy equals 0.53 cm  Pathology counseling: I discussed the final pathology report of the patient provided  a copy of this report. I discussed the margins as well as lymph node surgeries. We also discussed the final staging along with previously performed ER/PR and HER-2/neu testing.  Plan: 1. Based on the final tumor size 0.53 cm, I discussed the pros and cons of anti-HER-2 treatment.  I offered patient 3 options. A. no adjuvant anti-HER-2 therapy based upon small tumor size B.  one year treatment with Herceptin plus/minus Taxol which would be considered standard of care for tumors that are greater than 0.5 cm C. ultimately shorter duration of anti-HER-2 therapy based upon [facility] trial where they used only 9 weeks of Herceptin treatment. I suggested that he could do every 3 weeks 3 cycles.   After much discussion we decided that she could be treated for a short interval with Herceptin every 3 weeks X 3 cycles (short duration of Herceptin was used in [facility] trial)  2. Adj Anti estrogen therapy with anastrozole 1 mg daily There is no role for radiation since she had mastectomy. We will however discuss the close margin for DCIS in the tumor board.  Echocardiogram will be ordered. We will do Herceptin through peripheral vein. Plan to start Herceptin with anastrozole on 10/12/2015  No orders of the defined types were placed in this encounter.   The patient has a good understanding of the overall plan. she agrees with it. she will call with any problems that may develop before the next visit here.   Lindi Adie,  Tyler Deis, MD 09/21/2015

## 2015-09-21 NOTE — Telephone Encounter (Signed)
appt made and avs printed. Echo to be scheduled. Message sent to [facility]

## 2015-09-22 ENCOUNTER — Telehealth: Payer: Self-pay | Admitting: Hematology and Oncology

## 2015-09-22 DIAGNOSIS — R140 Abdominal distension (gaseous): Secondary | ICD-10-CM | POA: Diagnosis not present

## 2015-09-22 DIAGNOSIS — R933 Abnormal findings on diagnostic imaging of other parts of digestive tract: Secondary | ICD-10-CM | POA: Diagnosis not present

## 2015-09-22 NOTE — Telephone Encounter (Signed)
Left message for patient 3/13 appt details.

## 2015-09-23 ENCOUNTER — Encounter: Payer: Self-pay | Admitting: Hematology and Oncology

## 2015-09-23 ENCOUNTER — Other Ambulatory Visit: Payer: Self-pay | Admitting: Gastroenterology

## 2015-09-23 NOTE — Progress Notes (Signed)
Called pt to discuss copay assistance.  Enrolled pt in the [employer] card assistance program which is she is approved for $25,000 for Herceptin from 09/23/15 to 09/21/16.  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  I also discussed the [employer].  She will bring her & her husbands proof of income on 09/27/15 to see if she qualifies for the grant.

## 2015-09-27 ENCOUNTER — Encounter: Payer: Self-pay | Admitting: *Deleted

## 2015-09-27 ENCOUNTER — Encounter: Payer: Self-pay | Admitting: Hematology and Oncology

## 2015-09-27 ENCOUNTER — Other Ambulatory Visit (HOSPITAL_COMMUNITY): Payer: 59

## 2015-09-27 ENCOUNTER — Other Ambulatory Visit: Payer: 59

## 2015-09-27 NOTE — Progress Notes (Signed)
left in pod and left for dr. Serena Croissant

## 2015-09-27 NOTE — Progress Notes (Signed)
Received office notes from [facility], reviewed by Dr. Lindi Adie, sent to scan.

## 2015-09-28 ENCOUNTER — Ambulatory Visit (HOSPITAL_COMMUNITY): Payer: 59 | Admitting: Certified Registered Nurse Anesthetist

## 2015-09-28 ENCOUNTER — Encounter (HOSPITAL_COMMUNITY): Payer: Self-pay | Admitting: Certified Registered Nurse Anesthetist

## 2015-09-28 ENCOUNTER — Encounter (HOSPITAL_COMMUNITY)
Admission: RE | Disposition: A | Discharge status: Home / Self Care | Payer: Self-pay | Source: Ambulatory Visit | Attending: Gastroenterology

## 2015-09-28 ENCOUNTER — Ambulatory Visit (HOSPITAL_COMMUNITY)
Admission: RE | Admit: 2015-09-28 | Discharge: 2015-09-28 | Disposition: A | Discharge status: Home / Self Care | Payer: 59 | Source: Ambulatory Visit | Attending: Gastroenterology | Admitting: Gastroenterology

## 2015-09-28 DIAGNOSIS — R933 Abnormal findings on diagnostic imaging of other parts of digestive tract: Secondary | ICD-10-CM | POA: Insufficient documentation

## 2015-09-28 DIAGNOSIS — Z7982 Long term (current) use of aspirin: Secondary | ICD-10-CM | POA: Diagnosis not present

## 2015-09-28 DIAGNOSIS — Z7984 Long term (current) use of oral hypoglycemic drugs: Secondary | ICD-10-CM | POA: Insufficient documentation

## 2015-09-28 DIAGNOSIS — K2970 Gastritis, unspecified, without bleeding: Secondary | ICD-10-CM | POA: Diagnosis not present

## 2015-09-28 DIAGNOSIS — E119 Type 2 diabetes mellitus without complications: Secondary | ICD-10-CM | POA: Diagnosis not present

## 2015-09-28 DIAGNOSIS — K317 Polyp of stomach and duodenum: Secondary | ICD-10-CM | POA: Diagnosis not present

## 2015-09-28 DIAGNOSIS — K2950 Unspecified chronic gastritis without bleeding: Secondary | ICD-10-CM | POA: Diagnosis not present

## 2015-09-28 DIAGNOSIS — K9289 Other specified diseases of the digestive system: Secondary | ICD-10-CM | POA: Diagnosis not present

## 2015-09-28 HISTORY — PX: ESOPHAGOGASTRODUODENOSCOPY (EGD) WITH PROPOFOL: SHX5813

## 2015-09-28 LAB — GLUCOSE, CAPILLARY: Glucose-Capillary: 101 mg/dL — ABNORMAL HIGH (ref 65–99)

## 2015-09-28 SURGERY — ESOPHAGOGASTRODUODENOSCOPY (EGD) WITH PROPOFOL
Anesthesia: Monitor Anesthesia Care

## 2015-09-28 MED ORDER — PROPOFOL 500 MG/50ML IV EMUL
INTRAVENOUS | Status: DC | PRN
  Administered 2015-09-28: 100 ug/kg/min via INTRAVENOUS

## 2015-09-28 MED ORDER — PROPOFOL 10 MG/ML IV BOLUS
INTRAVENOUS | Status: DC | PRN
  Administered 2015-09-28 (×4): 20 mg via INTRAVENOUS

## 2015-09-28 MED ORDER — SODIUM CHLORIDE 0.9 % IV SOLN: INTRAVENOUS | Status: DC

## 2015-09-28 MED ORDER — LACTATED RINGERS IV SOLN
INTRAVENOUS | Status: DC | PRN
  Administered 2015-09-28: 14:00:00 via INTRAVENOUS

## 2015-09-28 NOTE — Anesthesia Postprocedure Evaluation (Signed)
Anesthesia Post Note  Patient: Nichole Wilson  Procedure(s) Performed: Procedure(s) (LRB): ESOPHAGOGASTRODUODENOSCOPY (EGD) WITH PROPOFOL (N/A)  Patient location during evaluation: PACU Anesthesia Type: MAC Level of consciousness: awake and alert Pain management: pain level controlled Vital Signs Assessment: post-procedure vital signs reviewed and stable Respiratory status: spontaneous breathing, nonlabored ventilation, respiratory function stable and patient connected to nasal cannula oxygen Cardiovascular status: blood pressure returned to baseline and stable Postop Assessment: no signs of nausea or vomiting Anesthetic complications: no    Last Vitals:  Filed Vitals:   09/28/15 1321 09/28/15 1430  BP: 126/78 97/66  Pulse: 93 90  Temp:  36.6 C  Resp: 17 18    Last Pain: There were no vitals filed for this visit.               Shanterica Biehler S

## 2015-09-28 NOTE — Progress Notes (Signed)
[name] [place]ita 2:02 PM  Subjective: Patient with no new complaints since we saw her last week in the office  Objective: Vital signs stable afebrile exam please see preassessment evaluation  Assessment: Abnormal CAT scan  Plan: Okay to proceed with endoscopy with anesthesia assistance with further workup and plans pending those findings  [facility] E  Pager [phone] After 5PM or if no answer call [phone]

## 2015-09-28 NOTE — Discharge Instructions (Signed)
Call if question or problem otherwise call for biopsy report in 1 week and will decide if surgical consultation or endoscopic ultrasound with deeper biopsies is needed  Esophagogastroduodenoscopy, Care After Refer to this sheet in the next few weeks. These instructions provide you with information about caring for yourself after your procedure. Your health care provider may also give you more specific instructions. Your treatment has been planned according to current medical practices, but problems sometimes occur. Call your health care provider if you have any problems or questions after your procedure. WHAT TO EXPECT AFTER THE PROCEDURE After your procedure, it is typical to feel:  Soreness in your throat.  Pain with swallowing.  Sick to your stomach (nauseous).  Bloated.  Dizzy.  Fatigued. HOME CARE INSTRUCTIONS  Do not eat or drink anything until the numbing medicine (local anesthetic) has worn off and your gag reflex has returned. You will know that the local anesthetic has worn off when you can swallow comfortably.  Do not drive or operate machinery until directed by your health care provider.  Take medicines only as directed by your health care provider. SEEK MEDICAL CARE IF:   You cannot stop coughing.  You are not urinating at all or less than usual. SEEK IMMEDIATE MEDICAL CARE IF:  You have difficulty swallowing.  You cannot eat or drink.  You have worsening throat or chest pain.  You have dizziness or lightheadedness or you faint.  You have nausea or vomiting.  You have chills.  You have a fever.  You have severe abdominal pain.  You have black, tarry, or bloody stools.   This information is not intended to replace advice given to you by your health care provider. Make sure you discuss any questions you have with your health care provider.   Document Released: 06/19/2012 Document Revised: 07/24/2014 Document Reviewed: 06/19/2012 Elsevier Interactive  Patient Education [employer].

## 2015-09-28 NOTE — Anesthesia Preprocedure Evaluation (Addendum)
Anesthesia Evaluation  Patient identified by MRN, date of birth, ID band Patient awake    Reviewed: Allergy & Precautions, NPO status , Patient's Chart, lab work & pertinent test results  History of Anesthesia Complications (+) PONV and history of anesthetic complications ("PT STATES DURING HER C-SECTION SHE WAS AWARE OF INCISION AND PAIN -THEN WAS QUICKLY "PUT OUT"")  Airway Mallampati: II  TM Distance: >3 FB Neck ROM: Full    Dental no notable dental hx. (+) Dental Advisory Given   Pulmonary    Pulmonary exam normal breath sounds clear to auscultation       Cardiovascular Normal cardiovascular exam Rhythm:Regular Rate:Normal     Neuro/Psych  Headaches,  Neuromuscular disease (Sciatica)    GI/Hepatic Hepatic stenosis   Endo/Other  diabetes, Type 2, Oral Hypoglycemic Agents  Renal/GU      Musculoskeletal   Abdominal   Peds  Hematology   Anesthesia Other Findings   Reproductive/Obstetrics                            Anesthesia Physical Anesthesia Plan  ASA: II  Anesthesia Plan: MAC   Post-op Pain Management:    Induction: Intravenous  Airway Management Planned: Nasal Cannula  Additional Equipment:   Intra-op Plan:   Post-operative Plan:   Informed Consent: I have reviewed the patients History and Physical, chart, labs and discussed the procedure including the risks, benefits and alternatives for the proposed anesthesia with the patient or authorized representative who has indicated his/her understanding and acceptance.   Dental advisory given  Plan Discussed with: CRNA and Surgeon  Anesthesia Plan Comments:         Anesthesia Quick Evaluation

## 2015-09-28 NOTE — Anesthesia Procedure Notes (Signed)
Procedure Name: MAC Date/Time: 09/28/2015 2:00 PM Performed by: Salli Quarry Jakaria Lavergne Pre-anesthesia Checklist: Patient identified, Emergency Drugs available, Suction available and Patient being monitored Patient Re-evaluated:Patient Re-evaluated prior to inductionOxygen Delivery Method: Nasal cannula

## 2015-09-28 NOTE — Transfer of Care (Signed)
Immediate Anesthesia Transfer of Care Note  Patient: Nichole Wilson  Procedure(s) Performed: Procedure(s) with comments: ESOPHAGOGASTRODUODENOSCOPY (EGD) WITH PROPOFOL (N/A) - H AND P IN DRAWER  Patient Location: Endoscopy Unit  Anesthesia Type:MAC  Level of Consciousness: awake, alert , oriented and patient cooperative  Airway & Oxygen Therapy: Patient Spontanous Breathing and Patient connected to nasal cannula oxygen  Post-op Assessment: Report given to RN and Post -op Vital signs reviewed and stable  Post vital signs: Reviewed and stable  Last Vitals:  Filed Vitals:   09/28/15 1321  BP: 126/78  Pulse: 93  Resp: 17    Complications: No apparent anesthesia complications

## 2015-09-28 NOTE — Op Note (Signed)
[facility] Patient Name: Nichole Wilson Procedure Date : 09/28/2015 MRN: [id number] Attending MD: Clarene Essex , MD Date of Birth: 03/16/1961 CSN: [id number] Age: 55 Admit Type: Outpatient Procedure:                Upper GI endoscopy Indications:              Abnormal CT of the GI tract Providers:                Clarene Essex, MD, Malka So, RN, Despina Pole,                            Technician Referring MD:              Medicines:                Propofol total dose [id number] mg IV Complications:            No immediate complications. Estimated Blood Loss:     Estimated blood loss: none. Estimated blood loss                            was minimal. Procedure:                Pre-Anesthesia Assessment:                           - Prior to the procedure, a History and Physical                            was performed, and patient medications and                            allergies were reviewed. The patient's tolerance of                            previous anesthesia was also reviewed. The risks                            and benefits of the procedure and the sedation                            options and risks were discussed with the patient.                            All questions were answered, and informed consent                            was obtained. Prior Anticoagulants: The patient has                            taken no previous anticoagulant or antiplatelet                            agents. ASA Grade Assessment: II - A patient with  mild systemic disease. After reviewing the risks                            and benefits, the patient was deemed in                            satisfactory condition to undergo the procedure.                           After obtaining informed consent, the endoscope was                            passed under direct vision. Throughout the                            procedure, the patient's blood pressure,  pulse, and                            oxygen saturations were monitored continuously. The                            [id number]) scope was introduced through the                            mouth, and advanced to the second part of duodenum.                            The upper GI endoscopy was accomplished without                            difficulty. The patient tolerated the procedure                            well. Scope In: Scope Out: Findings:      The larynx was normal.      The examined esophagus was normal.      A medium-sized, submucosal, non-circumferential mass with no bleeding       and tiny umbilical area and no stigmata of recent bleeding was found in       the cardia. Biopsies were taken with a cold forceps for histology.      The ampulla, duodenal bulb, first portion of the duodenum, second       portion of the duodenum and area of the papilla were normal.      The exam was otherwise without abnormality. Impression:               - Normal larynx.                           - Normal esophagus.                           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                            Biopsied.                           -  Normal ampulla, duodenal bulb, first portion of                            the duodenum, second portion of the duodenum and                            area of the papilla.                           - The examination was otherwise normal. Moderate Sedation:      moderate sedation-none Recommendation:           - Patient has a contact number available for                            emergencies. The signs and symptoms of potential                            delayed complications were discussed with the                            patient. Return to normal activities tomorrow.                            Written discharge instructions were provided to the                            patient.                           - Soft diet today.                            - Continue present medications.                           - Await pathology results.                           - Return to GI clinic PRN.                           - Telephone GI clinic for pathology results in 1                            week.                           - Refer to a surgeon at appointment to be scheduled.                           - Perform an upper endoscopic ultrasound (UEUS) at                            appointment to be scheduled if needed. Procedure Code(s):        --- Professional ---  [id number], Esophagogastroduodenoscopy, flexible,                            transoral; with biopsy, single or multiple Diagnosis Code(s):        --- Professional ---                           D49.0, Neoplasm of unspecified behavior of                            digestive system                           R93.3, Abnormal findings on diagnostic imaging of                            other parts of digestive tract CPT copyright 2016 American Medical Association. All rights reserved. The codes documented in this report are preliminary and upon coder review may  be revised to meet current compliance requirements. Clarene Essex, MD Clarene Essex, MD 09/28/2015 2:34:21 PM This report has been signed electronically. Number of Addenda: 0

## 2015-09-29 ENCOUNTER — Encounter (HOSPITAL_COMMUNITY): Payer: Self-pay | Admitting: Gastroenterology

## 2015-09-29 ENCOUNTER — Encounter: Payer: Self-pay | Admitting: Hematology and Oncology

## 2015-09-29 NOTE — Progress Notes (Signed)
Faxed to [employer] [phone] and mailed cpy to patient.

## 2015-09-30 ENCOUNTER — Ambulatory Visit: Payer: 59 | Attending: General Surgery | Admitting: Physical Therapy

## 2015-09-30 DIAGNOSIS — Z9189 Other specified personal risk factors, not elsewhere classified: Secondary | ICD-10-CM | POA: Diagnosis not present

## 2015-09-30 DIAGNOSIS — M25511 Pain in right shoulder: Secondary | ICD-10-CM | POA: Insufficient documentation

## 2015-09-30 DIAGNOSIS — R222 Localized swelling, mass and lump, trunk: Secondary | ICD-10-CM | POA: Diagnosis not present

## 2015-09-30 DIAGNOSIS — R609 Edema, unspecified: Secondary | ICD-10-CM | POA: Insufficient documentation

## 2015-09-30 DIAGNOSIS — M25611 Stiffness of right shoulder, not elsewhere classified: Secondary | ICD-10-CM | POA: Insufficient documentation

## 2015-09-30 NOTE — Patient Instructions (Signed)
Cane Overhead - Supine  Hold cane at thighs with both hands, extend arms straight over head. Hold ___ seconds. Repeat _5__ times. Do __3_ times per day.   Copyright  VHI. All rights reserved.

## 2015-10-01 ENCOUNTER — Ambulatory Visit (HOSPITAL_BASED_OUTPATIENT_CLINIC_OR_DEPARTMENT_OTHER)
Admission: RE | Admit: 2015-10-01 | Discharge: 2015-10-01 | Disposition: A | Discharge status: Home / Self Care | Payer: 59 | Source: Ambulatory Visit | Attending: Cardiology | Admitting: Cardiology

## 2015-10-01 ENCOUNTER — Ambulatory Visit (HOSPITAL_COMMUNITY)
Admission: RE | Admit: 2015-10-01 | Discharge: 2015-10-01 | Disposition: A | Discharge status: Home / Self Care | Payer: 59 | Source: Ambulatory Visit | Attending: Cardiology | Admitting: Cardiology

## 2015-10-01 VITALS — BP 118/84 | HR 87 | Wt 148.8 lb

## 2015-10-01 DIAGNOSIS — Z7982 Long term (current) use of aspirin: Secondary | ICD-10-CM | POA: Diagnosis not present

## 2015-10-01 DIAGNOSIS — E785 Hyperlipidemia, unspecified: Secondary | ICD-10-CM | POA: Insufficient documentation

## 2015-10-01 DIAGNOSIS — E119 Type 2 diabetes mellitus without complications: Secondary | ICD-10-CM | POA: Diagnosis not present

## 2015-10-01 DIAGNOSIS — C50919 Malignant neoplasm of unspecified site of unspecified female breast: Secondary | ICD-10-CM

## 2015-10-01 DIAGNOSIS — Z7984 Long term (current) use of oral hypoglycemic drugs: Secondary | ICD-10-CM | POA: Insufficient documentation

## 2015-10-01 DIAGNOSIS — C50411 Malignant neoplasm of upper-outer quadrant of right female breast: Secondary | ICD-10-CM | POA: Diagnosis not present

## 2015-10-01 DIAGNOSIS — I10 Essential (primary) hypertension: Secondary | ICD-10-CM | POA: Insufficient documentation

## 2015-10-01 DIAGNOSIS — Z9011 Acquired absence of right breast and nipple: Secondary | ICD-10-CM | POA: Diagnosis not present

## 2015-10-01 DIAGNOSIS — Z170 Estrogen receptor positive status [ER+]: Secondary | ICD-10-CM | POA: Diagnosis not present

## 2015-10-01 DIAGNOSIS — Z79899 Other long term (current) drug therapy: Secondary | ICD-10-CM | POA: Insufficient documentation

## 2015-10-01 DIAGNOSIS — Z833 Family history of diabetes mellitus: Secondary | ICD-10-CM | POA: Diagnosis not present

## 2015-10-01 NOTE — Progress Notes (Signed)
  Echocardiogram 2D Echocardiogram has been performed.  Saide Lanuza 10/01/2015, 10:21 AM

## 2015-10-01 NOTE — Therapy (Signed)
[place], [place], [address] Phone: [phone]   Fax:  [phone]  Physical Therapy Evaluation  Patient Details  Name: Nichole Wilson MRN: [id number] Date of Birth: April 10, 1961 Referring Provider: Rolm Bookbinder   Encounter Date: 09/30/2015      PT End of Session - 10/01/15 0808    Visit Number 1   Number of Visits 9   Date for PT Re-Evaluation 11/01/15   PT Start Time 1125   PT Stop Time 1210   PT Time Calculation (min) 45 min   Activity Tolerance Patient tolerated treatment well   Behavior During Therapy [employer] for tasks assessed/performed      Past Medical History  Diagnosis Date  . Hypertriglyceridemia   . Hepatic steatosis   . Breast cyst, left 01-04-12    cyst no longer apparent  . Migraine without aura, without mention of intractable migraine without mention of status migrainosus   . Hypercholesterolemia with hypertriglyceridemia   . Cholelithiasis with choledocholithiasis   . Glucose intolerance (impaired glucose tolerance)     since at least 2011.   . Gallstone pancreatitis   . Hypertension 01-04-12    med d/c-last taken 12-07-11  -PT [facility] STOPPED HER B/P MEDICATION BECAUSE HER B/P TOO LOW AND PT STATES HER B/P'S HAVE BEEN OK SINCE.  [name] Complication of anesthesia 2002    PT STATES DURING HER C-SECTION SHE WAS AWARE OF INCISION AND PAIN -THEN WAS QUICKLY "PUT OUT"  . Chronic calculus cholecystitis 12/25/2011    Lap chole on 03/25/12   . PONV (postoperative nausea and vomiting)   . Pre-diabetes   . Cancer [facility])     right breast cancer    Past Surgical History  Procedure Laterality Date  . Cesarean section      2002  . Ercp  12/05/2011    Procedure: ENDOSCOPIC RETROGRADE CHOLANGIOPANCREATOGRAPHY (ERCP);  Surgeon: Inda Castle, MD;  Location: [facility];  Service: Endoscopy;  Laterality: N/A;  . Ercp  12/08/2011    Procedure: ENDOSCOPIC RETROGRADE  CHOLANGIOPANCREATOGRAPHY (ERCP);  Surgeon: Inda Castle, MD;  Location: [facility];  Service: Gastroenterology;  Laterality: N/A;  . Cholecystectomy  03/25/2012    Procedure: LAPAROSCOPIC CHOLECYSTECTOMY WITH INTRAOPERATIVE CHOLANGIOGRAM;  Surgeon: Haywood Lasso, MD;  Location: [facility];  Service: General;  Laterality: N/A;  Laparoscopic Cholecystectomy with Intraoperative Cholangiogram  . Mastectomy w/ sentinel node biopsy Right 09/15/2015    Procedure: RIGHT MASTECTOMY WITH SENTINEL LYMPH NODE BIOPSY;  Surgeon: Rolm Bookbinder, MD;  Location: [facility];  Service: General;  Laterality: Right;  . Esophagogastroduodenoscopy (egd) with propofol N/A 09/28/2015    Procedure: ESOPHAGOGASTRODUODENOSCOPY (EGD) WITH PROPOFOL;  Surgeon: Clarene Essex, MD;  Location: [facility] ENDOSCOPY;  Service: Endoscopy;  Laterality: N/A;  H AND P IN DRAWER    There were no vitals filed for this visit.  Visit Diagnosis:  Shoulder stiffness, right  Pain in joint of right shoulder  At risk for lymphedema  Postoperative edema      Subjective Assessment - 10/01/15 0803    Subjective has more trouble raising her arm when she is sitting up , she is able to do it better when she is laying down. She has been having a lot of problems with familty issues    Pertinent History right breast cancer with mastetomy on 09/15/15 sentinel node biopsy. She has not chemotherapy or radiation and has not plans  for it    Patient Stated Goals to be  able to riase her hand all the way and to get stronger    Currently in Pain? Yes   Pain Score 3    Pain Location Chest  at incision    Pain Descriptors / Indicators Sharp;Dull;Burning;Aching   Pain Type Surgical pain   Pain Radiating Towards down towards right upper arm    Pain Onset 1 to 4 weeks ago   Pain Frequency Constant   Aggravating Factors  when she has to use it    Pain Relieving Factors not using it    Effect of Pain on Daily Activities interferes with household chores              [facility] PT Assessment - 10/01/15 0001    Assessment   Medical Diagnosis right breast cancer    Referring Provider Rolm Bookbinder    Onset Date/Surgical Date 09/15/15   Hand Dominance Right   Precautions   Precautions Other (comment)   Precaution Comments cancer    Restrictions   Weight Bearing Restrictions No   Balance Screen   Has the patient fallen in the past 6 months No   Has the patient had a decrease in activity level because of a fear of falling?  No   Is the patient reluctant to leave their home because of a fear of falling?  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 She moves her arm slowly and has mild protectve postureing    Skin Integrity healing incision with no open area    Sensation   Light Touch Not tested   Coordination   Gross Motor Movements are Fluid and Coordinated Yes  scapular assymetry: decreased movement of right scap   Posture/Postural Control   Posture/Postural Control Postural limitations   Postural Limitations Rounded Shoulders;Forward head   AROM   Right Shoulder Flexion 120 Degrees   Right Shoulder ABduction 81 Degrees   Right Shoulder External Rotation 80 Degrees   Left Shoulder Flexion 154 Degrees   Left Shoulder ABduction 175 Degrees   Left Shoulder External Rotation 85 Degrees   Strength   Overall Strength Comments right  shoulder movement is limited by pain  and decreased scapular stabalization    Right Shoulder Flexion 2+/5   Right Shoulder ABduction 2+/5   Right Shoulder External Rotation 2+/5   Left Shoulder Flexion 4/5   Left Shoulder ABduction 4/5   Left Shoulder External Rotation 4/5           LYMPHEDEMA/ONCOLOGY QUESTIONNAIRE - 09/30/15 1145    Right Upper Extremity Lymphedema   10 cm Proximal to Olecranon Process 30.5 cm   Olecranon Process 26.5 cm   15 cm Proximal to Ulnar Styloid Process 26 cm   10 cm Proximal to Ulnar Styloid Process 23.5 cm   Just Proximal to Ulnar Styloid Process 15.3 cm   Across Hand at [employer] 18.7 cm   At [place] of 2nd  Digit 6.5 cm   Left Upper Extremity Lymphedema   10 cm Proximal to Olecranon Process 30 cm   Olecranon Process 26.5 cm   15 cm Proximal to Ulnar Styloid Process 26 cm   10 cm Proximal to Ulnar Styloid Process 23.5 cm   Just Proximal to Ulnar Styloid Process 15.5 cm   Across Hand at [employer] 18.5 cm   At [employer] of 2nd Digit 6 cm           Quick Dash - 10/01/15 0001    Open a tight or new jar Mild difficulty   Do heavy household chores (wash walls, wash floors) Mild difficulty   Carry a shopping bag or briefcase Mild difficulty   Wash your back Mild difficulty   Use a knife to cut food Mild difficulty   Recreational activities in which you take some force or impact through your arm, shoulder, or hand (golf, hammering, tennis) Moderate difficulty   During the past week, to what extent has your arm, shoulder or hand problem interfered with your normal social activities with family, friends, neighbors, or groups? Modererately   During the past week, to what extent has your arm, shoulder or hand problem limited your work or other regular daily activities Modererately   Arm, shoulder, or hand pain. Moderate   Tingling (pins and needles) in your arm, shoulder, or hand Mild   Difficulty Sleeping Mild difficulty   DASH Score 34.09 %              [facility] Adult PT Treatment/Exercise - 10/01/15 0001    Self-Care   Self-Care Other Self-Care Comments   Other Self-Care Comments  provided tg soft for right arm for symptomatic relief and 1/4 foam patch for lateral trunk inside compressin bra.  Pt reports that "that feels better"    Shoulder Exercises: Supine   Other Supine Exercises dowel rod flexion to tolerance                 PT Education - 10/01/15 0807    Education provided Yes   Education Details wear tg soft for comfort only, do not let it roll, remove if uncomfortable    Person(s) Educated Patient   Methods Explanation   Comprehension Verbalized understanding                [facility] Goals - 10/01/15 [id number]    CC Long Term Goal  #1   Title Patient with verbalize an understanding of lymphedema risk reduction precautions   Time 4   Period Weeks   CC Long Term Goal  #2   Title Patient will be independent in a home exercise program   Time 4   Period Weeks   Status New   CC Long Term Goal  #3   Title Patient will improve left shoulder abduction to 120 degrees so that she can achieve position needed for radiation therapy.   Time 4   Period Weeks   Status New   CC Long Term Goal  #4   Title Patient will improve right  shoulder abduction to 140 degrees so that she can return to previous functional level    Time 4   Period Weeks   Status New   CC Long Term Goal  #5   Title Patient will decrease the DASH score to < 20    to demonstrate increased functional use of upper extremity   Time 4   Period Weeks   Status  New            Plan - 10/01/15 0809    Clinical Impression Statement 54 yo female about 2 weeks post right mastectomy with sentinel  lymph node removed who is having post op edema and pain with decreased shoulder range of motion and strength.  She will not be having radiation or chemo.    Pt will benefit from skilled therapeutic intervention in order to improve on the  following deficits Increased fascial restricitons;Impaired UE functional use;Decreased knowledge of precautions;Decreased skin integrity;Pain;Decreased scar mobility;Decreased knowledge of use of DME;Decreased strength;Increased edema;Postural dysfunction   Rehab Potential Excellent   Clinical Impairments Affecting Rehab Potential none   PT Frequency 2x / week   PT Duration 4 weeks   PT Treatment/Interventions ADLs/Self Care Home Management;Manual lymph drainage;Scar mobilization;Passive range of motion;Patient/family education;DME Instruction;Therapeutic activities;Taping;Manual techniques;Therapeutic exercise   PT Next Visit Plan Instruct in HEP for scapular and shoulder AROM, assess post op edema, sign up for ABC class    Recommended Other Services ABC class    Consulted and Agree with Plan of Care Patient         Problem List Patient Active Problem List   Diagnosis Date Noted  . Breast cancer ([place]) 09/15/2015  . Gastric polyp 09/13/2015  . Breast cancer of upper-outer quadrant of right female breast ([place]) 08/26/2015  . Breast calcification, right 07/27/2015  . Inversion, nipple 07/27/2015  . Fatigue 07/22/2015  . Anterior scleritis of both eyes 05/12/2015  . Overweight (BMI 25.0-29.9) 05/12/2015  . Pre-diabetes 05/12/2015  . Essential hypertension, benign 09/05/2013  . Plantar fasciitis 09/05/2013  . Health care maintenance 03/24/2013  . Epidermal cyst 06/17/2012  . Sciatica 05/17/2012  . Hepatic steatosis 01/02/2012  . Sweating 12/12/2011  . Calculus of bile duct without mention of cholecystitis or obstruction 12/05/2011  . Mastitis 10/19/2010  . Hypercholesterolemia with hypertriglyceridemia 02/24/2009  . HYPERGLYCEMIA 02/24/2009  . Asymptomatic postmenopausal status 02/23/2009  . COMMON MIGRAINE 01/09/2007  . HEMORRHOIDS, NOS 09/13/2006   Donato Heinz. Owens Shark, PT    10/01/2015, 8:22 AM  [place], [place], [address] Phone: [phone]   Fax:  [phone]  Name: Nichole Wilson MRN: [id number] Date of Birth: Feb 22, 1961

## 2015-10-01 NOTE — Progress Notes (Signed)
Advanced Heart Failure Medication Review by a Pharmacist  Does the patient  feel that his/her medications are working for him/her?  yes  Has the patient been experiencing any side effects to the medications prescribed?  no  Does the patient measure his/her own blood pressure or blood glucose at home?  no   Does the patient have any problems obtaining medications due to transportation or finances?   no  Understanding of regimen: good Understanding of indications: good Potential of compliance: good Patient understands to avoid NSAIDs. Patient understands to avoid decongestants.  Issues to address at subsequent visits: None   Pharmacist comments: 55 YO pleasant female presenting for HF followup. Pt denies SE to her medication regimen or problems obtaining any medications.  Pt reports she was on Pravastatin in the past but is not currently taking a statin.     Time with patient: 5 min  Preparation and documentation time: 5 min  Total time: 10 min

## 2015-10-01 NOTE — Patient Instructions (Signed)
Follow up 3 months with echocardiogram.  Do the following things EVERYDAY: 1) Weigh yourself in the morning before breakfast. Write it down and keep it in a log. 2) Take your medicines as prescribed 3) Eat low salt foods-Limit salt (sodium) to 2000 mg per day.  4) Stay as active as you can everyday 5) Limit all fluids for the day to less than 2 liters  

## 2015-10-02 NOTE — Progress Notes (Signed)
Patient ID: Nichole Wilson, female   DOB: 28-Apr-1961, 55 y.o.   MRN: [id number] Oncologist: Dr Lindi Adie  55 yo with history of hyperlipidemia, HTN, and DM2 developed breast cancer.  She is now s/p right mastectomy in 3/17, ER+/PR-/HER2+ tumor.  Plan for Herceptin q 3 wks x 4 cycles.  She has no history of heart problems.  No chest pain or exertional dyspnea.  BP is controlled on current regimen.   PMH: 1. Hyperlipidemia 2. Gallstone pancreatitis: s/p cholecystectomy. 3. HTN 4. Type II diabetes 5. Breast cancer: Right mastectomy in 3/17, ER+/PR-/HER2+ tumor.  Plan for Herceptin q 3 wks x 4 cycles.   - Echo (3/17) with EF 60-65%, mild LVH, normal diastolic function, lateral s' 11.8, GLS -16.8% (strain did not track well towards apex).   SH: Married, nonsmoker, no ETOH.  Family History  Problem Relation Age of Onset  . Diabetes Mother   . Diabetes Father   . Diabetes Brother   . Colon cancer Neg Hx    ROS: All systems reviewed and negative except as per HPI.   Current Outpatient Prescriptions  Medication Sig Dispense Refill  . acetaminophen (TYLENOL) 500 MG tablet Take 1,000 mg by mouth every 8 (eight) hours as needed for mild pain.    [name] aspirin 81 MG tablet Take 81 mg by mouth daily.    . fenofibrate 160 MG tablet Take 160 mg by mouth daily.    . hydrochlorothiazide (HYDRODIURIL) 12.5 MG tablet Take 1 tablet (12.5 mg total) by mouth daily. 90 tablet 3  . hydrochlorothiazide (MICROZIDE) 12.5 MG capsule Take 1 capsule by mouth daily.  3  . Icosapent Ethyl 1 g CAPS Take 2 g by mouth 2 (two) times daily with a meal. 120 capsule 5  . metFORMIN (GLUCOPHAGE) 500 MG tablet Take 500 mg by mouth daily.    . Multiple Vitamin (MULTIVITAMIN WITH MINERALS) TABS Take 1 tablet by mouth daily.    [name] HYDROcodone-acetaminophen (NORCO/VICODIN) 5-325 MG tablet Take 1-2 tablets by mouth every 6 (six) hours as needed for moderate pain or severe pain. (Patient not taking: Reported on 09/27/2015) 30 tablet 0   No  current facility-administered medications for this encounter.   BP 118/84 mmHg  Pulse 87  Wt 148 lb 12.8 oz (67.495 kg)  SpO2 99%  LMP 11/29/2011 General: NAD Neck: No JVD, no thyromegaly or thyroid nodule.  Lungs: Clear to auscultation bilaterally with normal respiratory effort. CV: Nondisplaced PMI.  Heart regular S1/S2, no S3/S4, no murmur.  No peripheral edema.  No carotid bruit.  Normal pedal pulses.  Abdomen: Soft, nontender, no hepatosplenomegaly, no distention.  Skin: Intact without lesions or rashes.  Neurologic: Alert and oriented x 3.  Psych: Normal affect. Extremities: No clubbing or cyanosis.  HEENT: Normal.   Assessment/Plan: 1. Breast cancer: Plan for Herceptin x 4 cycles (shorter course than usual).  Baseline echo was done today, I reviewed.  EF is normal, strain is low but did not track well towards the apex so may not be accurate. I think that she should be stable to start Herceptin. - Repeat echo with office visit in 3 months.  2. HTN: BP controlled on HCTZ.   Loralie Champagne 10/02/2015

## 2015-10-05 ENCOUNTER — Ambulatory Visit: Payer: 59

## 2015-10-05 ENCOUNTER — Telehealth: Payer: Self-pay | Admitting: *Deleted

## 2015-10-05 DIAGNOSIS — M25611 Stiffness of right shoulder, not elsewhere classified: Secondary | ICD-10-CM

## 2015-10-05 DIAGNOSIS — M25511 Pain in right shoulder: Secondary | ICD-10-CM | POA: Diagnosis not present

## 2015-10-05 DIAGNOSIS — R222 Localized swelling, mass and lump, trunk: Secondary | ICD-10-CM | POA: Diagnosis not present

## 2015-10-05 DIAGNOSIS — Z9189 Other specified personal risk factors, not elsewhere classified: Secondary | ICD-10-CM | POA: Diagnosis not present

## 2015-10-05 DIAGNOSIS — R609 Edema, unspecified: Secondary | ICD-10-CM | POA: Diagnosis not present

## 2015-10-05 NOTE — Therapy (Signed)
[place], [place], [address] Phone: [phone]   Fax:  [phone]  Physical Therapy Treatment  Patient Details  Name: Nichole Wilson MRN: [id number] Date of Birth: [birth year] Referring Provider: Rolm Bookbinder   Encounter Date: 10/05/2015      PT End of Session - 10/05/15 1116    Visit Number 2   Number of Visits 9   Date for PT Re-Evaluation 11/01/15   PT Start Time 1017   PT Stop Time 1113   PT Time Calculation (min) 56 min   Activity Tolerance Patient tolerated treatment well   Behavior During Therapy [facility] for tasks assessed/performed      Past Medical History  Diagnosis Date  . Hypertriglyceridemia   . Hepatic steatosis   . Breast cyst, left 01-04-12    cyst no longer apparent  . Migraine without aura, without mention of intractable migraine without mention of status migrainosus   . Hypercholesterolemia with hypertriglyceridemia   . Cholelithiasis with choledocholithiasis   . Glucose intolerance (impaired glucose tolerance)     since at least 2011.   . Gallstone pancreatitis   . Hypertension 01-04-12    med d/c-last taken 12-07-11  -PT [place] STOPPED HER B/P MEDICATION BECAUSE HER B/P TOO LOW AND PT STATES HER B/P'S HAVE BEEN OK SINCE.  [name] Complication of anesthesia 2002    PT STATES DURING HER C-SECTION SHE WAS AWARE OF INCISION AND PAIN -THEN WAS QUICKLY "PUT OUT"  . Chronic calculus cholecystitis 12/25/2011    Lap chole on 03/25/12   . PONV (postoperative nausea and vomiting)   . Pre-diabetes   . Cancer [facility])     right breast cancer    Past Surgical History  Procedure Laterality Date  . Cesarean section      2002  . Ercp  12/05/2011    Procedure: ENDOSCOPIC RETROGRADE CHOLANGIOPANCREATOGRAPHY (ERCP);  Surgeon: Inda Castle, MD;  Location: [facility];  Service: Endoscopy;  Laterality: N/A;  . Ercp  12/08/2011    Procedure: ENDOSCOPIC RETROGRADE  CHOLANGIOPANCREATOGRAPHY (ERCP);  Surgeon: Inda Castle, MD;  Location: [place];  Service: Gastroenterology;  Laterality: N/A;  . Cholecystectomy  03/25/2012    Procedure: LAPAROSCOPIC CHOLECYSTECTOMY WITH INTRAOPERATIVE CHOLANGIOGRAM;  Surgeon: Haywood Lasso, MD;  Location[facility];  Service: General;  Laterality: N/A;  Laparoscopic Cholecystectomy with Intraoperative Cholangiogram  . Mastectomy w/ sentinel node biopsy Right 09/15/2015    Procedure: RIGHT MASTECTOMY WITH SENTINEL LYMPH NODE BIOPSY;  Surgeon: Rolm Bookbinder, MD;  Location: [place];  Service: General;  Laterality: Right;  . Esophagogastroduodenoscopy (egd) with propofol N/A 09/28/2015    Procedure: ESOPHAGOGASTRODUODENOSCOPY (EGD) WITH PROPOFOL;  Surgeon: Clarene Essex, MD;  Location: [facility] ENDOSCOPY;  Service: Endoscopy;  Laterality: N/A;  H AND P IN DRAWER    There were no vitals filed for this visit.  Visit Diagnosis:  Shoulder stiffness, right  Pain in joint of right shoulder  At risk for lymphedema      Subjective Assessment - 10/05/15 1020    Subjective I broke some of my dishes this weekend because my arm was so weak, it scared me! But I've been doing the exercises she gave me last time and they have helped.    Pertinent History right breast cancer with mastetomy on 09/15/15 sentinel node biopsy. She has not chemotherapy or radiation and has not plans  for it    Patient Stated Goals to be able to riase her hand all  the way and to get stronger    Currently in Pain? Yes   Pain Score 3    Pain Location Shoulder   Pain Orientation Right   Pain Descriptors / Indicators Dull   Pain Type Surgical pain   Pain Onset 1 to 4 weeks ago   Pain Frequency Intermittent   Aggravating Factors  end ROM   Pain Relieving Factors it's been getting better with the exercises!                         [place] Adult PT Treatment/Exercise - 10/05/15 0001    Shoulder Exercises: Pulleys   Flexion 2 minutes   ABduction 2 minutes    Shoulder Exercises: Therapy Ball   Flexion 10 reps   Shoulder Exercises: ROM/Strengthening   Other ROM/Strengthening Exercises Finger Ladder Rt UE into abduction 5 reps up to #17; also had pt do supine head press and scapular press for increase postural awareness 5 reps each 5 sec holds.   Other ROM/Strengthening Exercises Tried standing flexion with dowel rod but pt had difficulty with relaxing Rt UE so will cont these in supine for now.    Shoulder Exercises: Isometric Strengthening   Flexion 5X5"  Rt UE in doorframe   Flexion Limitations Mod verbal cuing for correct technique   Extension 5X5"  Rt UE in doorframe, 2 sets of 5 reps   External Rotation 5X5"  Rt UE in doorframe   Manual Therapy   Passive ROM In Supine to Rt sholder into flexion, abuction, and ER/IR all to pts tolerance.                 PT Education - 10/05/15 1054    Education provided Yes   Education Details Isometric strengthening and supine cane exercises   Person(s) Educated Patient   Methods Explanation;Demonstration;Handout   Comprehension Verbalized understanding;Returned demonstration;Need further instruction;Verbal cues required                [facility] Goals - 10/01/15 0819    CC Long Term Goal  #1   Title Patient with verbalize an understanding of lymphedema risk reduction precautions   Time 4   Period Weeks   CC Long Term Goal  #2   Title Patient will be independent in a home exercise program   Time 4   Period Weeks   Status New   CC Long Term Goal  #3   Title Patient will improve left shoulder abduction to 120 degrees so that she can achieve position needed for radiation therapy.   Time 4   Period Weeks   Status New   CC Long Term Goal  #4   Title Patient will improve right  shoulder abduction to 140 degrees so that she can return to previous functional level    Time 4   Period Weeks   Status New   CC Long Term Goal  #5   Title Patient will decrease the DASH score  to < 20    to demonstrate increased functional use of upper extremity   Time 4   Period Weeks   Status New            Plan - 10/05/15 1119    Clinical Impression Statement Pt did overall tolerated therapy well today. She had trouble relaxing Rt shoulder with standing cane exercises so continued these in supine. She did well with isometric strengthening and tolerated PROM very well reporting numerous times  throughout that the stretch "felt good". Progressed HEP to include supine abduction with cane and isomoetric strength.   Pt will benefit from skilled therapeutic intervention in order to improve on the following deficits Increased fascial restricitons;Impaired UE functional use;Decreased knowledge of precautions;Decreased skin integrity;Pain;Decreased scar mobility;Decreased knowledge of use of DME;Decreased strength;Increased edema;Postural dysfunction   Rehab Potential Excellent   Clinical Impairments Affecting Rehab Potential none   PT Frequency 2x / week   PT Duration 4 weeks   PT Treatment/Interventions ADLs/Self Care Home Management;Manual lymph drainage;Scar mobilization;Passive range of motion;Patient/family education;DME Instruction;Therapeutic activities;Taping;Manual techniques;Therapeutic exercise   PT Next Visit Plan Instruct in HEP for scapular and shoulder AROM, assess post op edema, sign up for ABC class    PT Home Exercise Plan see education section   Consulted and Agree with Plan of Care Patient        Problem List Patient Active Problem List   Diagnosis Date Noted  . Breast cancer ([place]) 09/15/2015  . Gastric polyp 09/13/2015  . Breast cancer of upper-outer quadrant of right female breast ([place]) 08/26/2015  . Breast calcification, right 07/27/2015  . Inversion, nipple 07/27/2015  . Fatigue 07/22/2015  . Anterior scleritis of both eyes 05/12/2015  . Overweight (BMI 25.0-29.9) 05/12/2015  . Pre-diabetes 05/12/2015  . Essential hypertension, benign 09/05/2013  .  Plantar fasciitis 09/05/2013  . Health care maintenance 03/24/2013  . Epidermal cyst 06/17/2012  . Sciatica 05/17/2012  . Hepatic steatosis 01/02/2012  . Sweating 12/12/2011  . Calculus of bile duct without mention of cholecystitis or obstruction 12/05/2011  . Mastitis 10/19/2010  . Hypercholesterolemia with hypertriglyceridemia 02/24/2009  . HYPERGLYCEMIA 02/24/2009  . Asymptomatic postmenopausal status 02/23/2009  . COMMON MIGRAINE 01/09/2007  . HEMORRHOIDS, NOS 09/13/2006    Otelia Limes, PTA 10/05/2015, [place], [place], [address] Phone: [phone]   Fax:  [phone]  Name: RIMAS ABDO MRN: [id number] Date of Birth: 01/31/1961

## 2015-10-05 NOTE — Telephone Encounter (Addendum)
FYI "Elsa Case Manager with [facility] Phone: [phone] ext [phone], Fax: [phone]. THIS PATIENT WAS REFERRED TO [place] BASED ON HER DIAGNOSIS.  COULD WE RECEIVE HER MOSE RECENT OFFICE NOTE/CONSULT NOTE.  WILL THERE BE ANY FURTHER TREATMENT AFTER THIS RECENT SURGERY?"  09-21-2015 Office note routed.

## 2015-10-05 NOTE — Patient Instructions (Addendum)
Cancer Rehab [phone]     Only do 5 reps of strengthening for about a week, then can start trying 8 or 10 reps at a time. Listen to your body, DON'T push into pain!!!!!  Flexion (Isometric)    Press right fist against wall. Hold __5__ seconds. Repeat _5-10___ times. Do __1-2__ sessions per day.  http://gt2.exer.us/114   Copyright  VHI. All rights reserved.   External Rotation (Isometric)    Place back of right fist against door frame, with elbow bent. Press fist against door frame. Hold _5___ seconds. Repeat _5-10___ times. Do _1-2___ sessions per day.  http://gt2.exer.us/110   Copyright  VHI. All rights reserved.   Extension (Isometric)    Place right bent elbow and back of arm against wall. Press elbow against wall. Hold __5__ seconds. Repeat _5-10___ times. Do _1-2___ sessions per day.  http://gt2.exer.us/112   Copyright  VHI. All rights reserved.    Cane Exercise: Abduction / Adduction    Hold cane palms down. Keeping back flat, move cane to right side away from chest chest. Hold __5__ seconds. Repeat _5___ times. Do _1-2___ sessions per day.  Also continue with raising the cane overhead for 5 reps holding 5 seconds. http://gt2.exer.us/90   Copyright  VHI. All rights reserved.    Also add in the AM head press and shoulder blade press before getting out of bed. 5 reps for 5 seconds each.

## 2015-10-07 ENCOUNTER — Other Ambulatory Visit: Payer: Self-pay | Admitting: Gastroenterology

## 2015-10-07 ENCOUNTER — Ambulatory Visit: Payer: 59

## 2015-10-07 DIAGNOSIS — M25511 Pain in right shoulder: Secondary | ICD-10-CM

## 2015-10-07 DIAGNOSIS — Z9189 Other specified personal risk factors, not elsewhere classified: Secondary | ICD-10-CM

## 2015-10-07 DIAGNOSIS — M25611 Stiffness of right shoulder, not elsewhere classified: Secondary | ICD-10-CM

## 2015-10-07 DIAGNOSIS — R609 Edema, unspecified: Secondary | ICD-10-CM | POA: Diagnosis not present

## 2015-10-07 DIAGNOSIS — R222 Localized swelling, mass and lump, trunk: Secondary | ICD-10-CM | POA: Diagnosis not present

## 2015-10-07 MED FILL — VASCEPA 1 GM CAPSULE: 1 | 30 days supply | Qty: 120 | Fill #1

## 2015-10-07 NOTE — Therapy (Signed)
[place], [place], [address] Phone: [phone]   Fax:  [phone]  Physical Therapy Treatment  Patient Details  Name: Nichole Wilson MRN: [id number] Date of Birth: [birth year] Referring Provider: Rolm Bookbinder   Encounter Date: 10/07/2015      PT End of Session - 10/07/15 1149    Visit Number 3   Number of Visits 9   Date for PT Re-Evaluation 11/01/15   PT Start Time 1102   PT Stop Time 1148   PT Time Calculation (min) 46 min   Activity Tolerance Patient tolerated treatment well   Behavior During Therapy [facility] for tasks assessed/performed      Past Medical History  Diagnosis Date  . Hypertriglyceridemia   . Hepatic steatosis   . Breast cyst, left 01-04-12    cyst no longer apparent  . Migraine without aura, without mention of intractable migraine without mention of status migrainosus   . Hypercholesterolemia with hypertriglyceridemia   . Cholelithiasis with choledocholithiasis   . Glucose intolerance (impaired glucose tolerance)     since at least 2011.   . Gallstone pancreatitis   . Hypertension 01-04-12    med d/c-last taken 12-07-11  -PT [place] STOPPED HER B/P MEDICATION BECAUSE HER B/P TOO LOW AND PT STATES HER B/P'S HAVE BEEN OK SINCE.  [name] Complication of anesthesia 2002    PT STATES DURING HER C-SECTION SHE WAS AWARE OF INCISION AND PAIN -THEN WAS QUICKLY "PUT OUT"  . Chronic calculus cholecystitis 12/25/2011    Lap chole on 03/25/12   . PONV (postoperative nausea and vomiting)   . Pre-diabetes   . Cancer [facility])     right breast cancer    Past Surgical History  Procedure Laterality Date  . Cesarean section      2002  . Ercp  12/05/2011    Procedure: ENDOSCOPIC RETROGRADE CHOLANGIOPANCREATOGRAPHY (ERCP);  Surgeon: Inda Castle, MD;  Location: [facility];  Service: Endoscopy;  Laterality: N/A;  . Ercp  12/08/2011    Procedure: ENDOSCOPIC RETROGRADE  CHOLANGIOPANCREATOGRAPHY (ERCP);  Surgeon: Inda Castle, MD;  Location: [place];  Service: Gastroenterology;  Laterality: N/A;  . Cholecystectomy  03/25/2012    Procedure: LAPAROSCOPIC CHOLECYSTECTOMY WITH INTRAOPERATIVE CHOLANGIOGRAM;  Surgeon: Haywood Lasso, MD;  [facility];  Service: General;  Laterality: N/A;  Laparoscopic Cholecystectomy with Intraoperative Cholangiogram  . Mastectomy w/ sentinel node biopsy Right 09/15/2015    Procedure: RIGHT MASTECTOMY WITH SENTINEL LYMPH NODE BIOPSY;  Surgeon: Rolm Bookbinder, MD;  Location: [place];  Service: General;  Laterality: Right;  . Esophagogastroduodenoscopy (egd) with propofol N/A 09/28/2015    Procedure: ESOPHAGOGASTRODUODENOSCOPY (EGD) WITH PROPOFOL;  Surgeon: Clarene Essex, MD;  Location: [facility] ENDOSCOPY;  Service: Endoscopy;  Laterality: N/A;  H AND P IN DRAWER    There were no vitals filed for this visit.  Visit Diagnosis:  Shoulder stiffness, right  Pain in joint of right shoulder  At risk for lymphedema  Postoperative edema      Subjective Assessment - 10/07/15 1105    Subjective I was a little sore after last visit but not unreasonably so. Still feeling just a little sore today in my Rt shoulder, no pain. I do notice the swelling is still there right under my armpit, can we do anything about that?   Pertinent History right breast cancer with mastetomy on 09/15/15 sentinel node biopsy. She has not chemotherapy or radiation and has not plans  for it  Patient Stated Goals to be able to riase her hand all the way and to get stronger    Currently in Pain? No/denies                         [facility] Adult PT Treatment/Exercise - 10/07/15 0001    Manual Therapy   Manual Lymphatic Drainage (MLD) In Supine: Short neck, superficial abdominals, (didn't do deep as pt was c/o of abdominal tenderness) diaphragmatic breathing, Rt inguinal nodes, Rt axillo-inguinal anastomosis and then focused on Rt lateral trunk at area of  swelling.    Passive ROM In Supine to Rt sholder into flexion, abuction, and ER/IR all to pts tolerance.    Neural Stretch To Rt UE during PROM                        [facility] Goals - 10/07/15 1155    CC Long Term Goal  #1   Title Patient with verbalize an understanding of lymphedema risk reduction precautions   Status On-going   CC Long Term Goal  #2   Title Patient will be independent in a home exercise program   Status On-going   CC Long Term Goal  #3   Title Patient will improve left shoulder abduction to 120 degrees so that she can achieve position needed for radiation therapy.   Status On-going   CC Long Term Goal  #4   Title Patient will improve right  shoulder abduction to 140 degrees so that she can return to previous functional level    Status On-going   CC Long Term Goal  #5   Title Patient will decrease the DASH score to < 20    to demonstrate increased functional use of upper extremity   Status On-going            Plan - 10/07/15 1152    Clinical Impression Statement Performed manual lymph drainage to focus on pts Rt lateral trunk edema where her incisions are stsill healing, but did not go over incisions, stayed inferior to them and did minimal skin stretch near them. Pt continued to tolerate PROM well and reports noticing that she is already starting to see improvements with her ROM.    Pt will benefit from skilled therapeutic intervention in order to improve on the following deficits Increased fascial restricitons;Impaired UE functional use;Decreased knowledge of precautions;Decreased skin integrity;Pain;Decreased scar mobility;Decreased knowledge of use of DME;Decreased strength;Increased edema;Postural dysfunction   Rehab Potential Excellent   Clinical Impairments Affecting Rehab Potential none   PT Frequency 2x / week   PT Duration 4 weeks   PT Treatment/Interventions ADLs/Self Care Home Management;Manual lymph drainage;Scar  mobilization;Passive range of motion;Patient/family education;DME Instruction;Therapeutic activities;Taping;Manual techniques;Therapeutic exercise   PT Next Visit Plan Instruct in HEP for scapular and shoulder AROM, assess post op edema   Recommended Other Services Pt wants to take the 4/6 ABC class, remind her when it gets close.    Consulted and Agree with Plan of Care Patient        Problem List Patient Active Problem List   Diagnosis Date Noted  . Breast cancer ([place]) 09/15/2015  . Gastric polyp 09/13/2015  . Breast cancer of upper-outer quadrant of right female breast ([place]) 08/26/2015  . Breast calcification, right 07/27/2015  . Inversion, nipple 07/27/2015  . Fatigue 07/22/2015  . Anterior scleritis of both eyes 05/12/2015  . Overweight (BMI 25.0-29.9) 05/12/2015  . Pre-diabetes 05/12/2015  .  Essential hypertension, benign 09/05/2013  . Plantar fasciitis 09/05/2013  . Health care maintenance 03/24/2013  . Epidermal cyst 06/17/2012  . Sciatica 05/17/2012  . Hepatic steatosis 01/02/2012  . Sweating 12/12/2011  . Calculus of bile duct without mention of cholecystitis or obstruction 12/05/2011  . Mastitis 10/19/2010  . Hypercholesterolemia with hypertriglyceridemia 02/24/2009  . HYPERGLYCEMIA 02/24/2009  . Asymptomatic postmenopausal status 02/23/2009  . COMMON MIGRAINE 01/09/2007  . HEMORRHOIDS, NOS 09/13/2006    Otelia Limes, PTA 10/07/2015, 11:57 AM  [place], [place], [address] Phone: [phone]   Fax:  [phone]  Name: MINDE GASH MRN: [id number] Date of Birth: Nov 04, 1960

## 2015-10-08 ENCOUNTER — Encounter (HOSPITAL_COMMUNITY): Payer: Self-pay | Admitting: *Deleted

## 2015-10-11 ENCOUNTER — Ambulatory Visit: Payer: 59

## 2015-10-11 ENCOUNTER — Other Ambulatory Visit: Payer: Self-pay

## 2015-10-11 DIAGNOSIS — C50411 Malignant neoplasm of upper-outer quadrant of right female breast: Secondary | ICD-10-CM

## 2015-10-11 DIAGNOSIS — Z9189 Other specified personal risk factors, not elsewhere classified: Secondary | ICD-10-CM

## 2015-10-11 DIAGNOSIS — R222 Localized swelling, mass and lump, trunk: Secondary | ICD-10-CM | POA: Diagnosis not present

## 2015-10-11 DIAGNOSIS — M25511 Pain in right shoulder: Secondary | ICD-10-CM | POA: Diagnosis not present

## 2015-10-11 DIAGNOSIS — M25611 Stiffness of right shoulder, not elsewhere classified: Secondary | ICD-10-CM | POA: Diagnosis not present

## 2015-10-11 DIAGNOSIS — R609 Edema, unspecified: Secondary | ICD-10-CM | POA: Diagnosis not present

## 2015-10-11 NOTE — Therapy (Signed)
[place], [place], [address] Phone: [phone]   Fax:  [phone]  Physical Therapy Treatment  Patient Details  Name: Nichole Wilson MRN: [id number] Date of Birth: 10-Jun-1961 Referring Provider: Rolm Bookbinder   Encounter Date: 10/11/2015      PT End of Session - 10/11/15 1208    Visit Number 4   Number of Visits 9   Date for PT Re-Evaluation 11/01/15   PT Start Time [id number]   PT Stop Time 1205   PT Time Calculation (min) 44 min   Activity Tolerance Patient tolerated treatment well   Behavior During Therapy [facility] for tasks assessed/performed      Past Medical History  Diagnosis Date  . Hypertriglyceridemia   . Hepatic steatosis   . Breast cyst, left 01-04-12    cyst no longer apparent  . Migraine without aura, without mention of intractable migraine without mention of status migrainosus   . Hypercholesterolemia with hypertriglyceridemia   . Cholelithiasis with choledocholithiasis   . Glucose intolerance (impaired glucose tolerance)     since at least 2011.   . Gallstone pancreatitis   . Hypertension 01-04-12    med d/c-last taken 12-07-11  -PT [place] STOPPED HER B/P MEDICATION BECAUSE HER B/P TOO LOW AND PT STATES HER B/P'S HAVE BEEN OK SINCE.  [name] Complication of anesthesia 2002    PT STATES DURING HER C-SECTION SHE WAS AWARE OF INCISION AND PAIN -THEN WAS QUICKLY "PUT OUT"  . Chronic calculus cholecystitis 12/25/2011    Lap chole on 03/25/12   . PONV (postoperative nausea and vomiting)   . Pre-diabetes   . Cancer [facility])     right breast cancer    Past Surgical History  Procedure Laterality Date  . Cesarean section      2002  . Ercp  12/05/2011    Procedure: ENDOSCOPIC RETROGRADE CHOLANGIOPANCREATOGRAPHY (ERCP);  Surgeon: Inda Castle, MD;  Location: [facility];  Service: Endoscopy;  Laterality: N/A;  . Ercp  12/08/2011    Procedure: ENDOSCOPIC RETROGRADE  CHOLANGIOPANCREATOGRAPHY (ERCP);  Surgeon: Inda Castle, MD;  Location: [place];  Service: Gastroenterology;  Laterality: N/A;  . Cholecystectomy  03/25/2012    Procedure: LAPAROSCOPIC CHOLECYSTECTOMY WITH INTRAOPERATIVE CHOLANGIOGRAM;  Surgeon: Haywood Lasso, MD;  [facility];  Service: General;  Laterality: N/A;  Laparoscopic Cholecystectomy with Intraoperative Cholangiogram  . Mastectomy w/ sentinel node biopsy Right 09/15/2015    Procedure: RIGHT MASTECTOMY WITH SENTINEL LYMPH NODE BIOPSY;  Surgeon: Rolm Bookbinder, MD;  Location: [place];  Service: General;  Laterality: Right;  . Esophagogastroduodenoscopy (egd) with propofol N/A 09/28/2015    Procedure: ESOPHAGOGASTRODUODENOSCOPY (EGD) WITH PROPOFOL;  Surgeon: Clarene Essex, MD;  Location: [facility] ENDOSCOPY;  Service: Endoscopy;  Laterality: N/A;  H AND P IN DRAWER    There were no vitals filed for this visit.  Visit Diagnosis:  Shoulder stiffness, right  Pain in joint of right shoulder  At risk for lymphedema      Subjective Assessment - 10/11/15 1124    Subjective Running late today, Ijust have so many appointments! My Rt shoulder is slowly getting better, I've been trying to use it more.    Pertinent History right breast cancer with mastetomy on 09/15/15 sentinel node biopsy. She has not chemotherapy or radiation and has not plans  for it    Patient Stated Goals to be able to riase her hand all the way and to get stronger    Currently  in Pain? No/denies                         [facility] Adult PT Treatment/Exercise - 10/11/15 0001    Manual Therapy   Passive ROM In Supine to Rt sholder into flexion, abuction, and ER/IR all to pts tolerance.    Neural Stretch To Rt UE during PROM                        Long Term Clinic Goals - 10/07/15 1155    CC Long Term Goal  #1   Title Patient with verbalize an understanding of lymphedema risk reduction precautions   Status On-going   CC Long Term Goal  #2    Title Patient will be independent in a home exercise program   Status On-going   CC Long Term Goal  #3   Title Patient will improve left shoulder abduction to 120 degrees so that she can achieve position needed for radiation therapy.   Status On-going   CC Long Term Goal  #4   Title Patient will improve right  shoulder abduction to 140 degrees so that she can return to previous functional level    Status On-going   CC Long Term Goal  #5   Title Patient will decrease the DASH score to < 20    to demonstrate increased functional use of upper extremity   Status On-going            Plan - 10/11/15 1208    Clinical Impression Statement Just focused on PROM today as pt reported this seemed most beneficial. Her end ROM wasn't as tight as last week so improvement noted with this.    Pt will benefit from skilled therapeutic intervention in order to improve on the following deficits Increased fascial restricitons;Impaired UE functional use;Decreased knowledge of precautions;Decreased skin integrity;Pain;Decreased scar mobility;Decreased knowledge of use of DME;Decreased strength;Increased edema;Postural dysfunction   Rehab Potential Excellent   Clinical Impairments Affecting Rehab Potential none   PT Frequency 2x / week   PT Duration 4 weeks   PT Treatment/Interventions ADLs/Self Care Home Management;Manual lymph drainage;Scar mobilization;Passive range of motion;Patient/family education;DME Instruction;Therapeutic activities;Taping;Manual techniques;Therapeutic exercise   PT Next Visit Plan Instruct in HEP for scapular and shoulder AROM, assess post op edema   Consulted and Agree with Plan of Care Patient        Problem List Patient Active Problem List   Diagnosis Date Noted  . Breast cancer ([place]) 09/15/2015  . Gastric polyp 09/13/2015  . Breast cancer of upper-outer quadrant of right female breast ([place]) 08/26/2015  . Breast calcification, right 07/27/2015  . Inversion, nipple  07/27/2015  . Fatigue 07/22/2015  . Anterior scleritis of both eyes 05/12/2015  . Overweight (BMI 25.0-29.9) 05/12/2015  . Pre-diabetes 05/12/2015  . Essential hypertension, benign 09/05/2013  . Plantar fasciitis 09/05/2013  . Health care maintenance 03/24/2013  . Epidermal cyst 06/17/2012  . Sciatica 05/17/2012  . Hepatic steatosis 01/02/2012  . Sweating 12/12/2011  . Calculus of bile duct without mention of cholecystitis or obstruction 12/05/2011  . Mastitis 10/19/2010  . Hypercholesterolemia with hypertriglyceridemia 02/24/2009  . HYPERGLYCEMIA 02/24/2009  . Asymptomatic postmenopausal status 02/23/2009  . COMMON MIGRAINE 01/09/2007  . HEMORRHOIDS, NOS 09/13/2006    Otelia Limes, PTA 10/11/2015, 12:12 PM  [place], [place], [address] Phone: [phone]   Fax:  [phone]  Name: Nichole Annear  Wilson MRN: [id number] Date of Birth: [birth year]

## 2015-10-12 ENCOUNTER — Encounter (HOSPITAL_COMMUNITY): Payer: Self-pay | Admitting: Anesthesiology

## 2015-10-12 ENCOUNTER — Ambulatory Visit (HOSPITAL_BASED_OUTPATIENT_CLINIC_OR_DEPARTMENT_OTHER): Payer: 59 | Admitting: Hematology and Oncology

## 2015-10-12 ENCOUNTER — Other Ambulatory Visit: Payer: Self-pay | Admitting: Gastroenterology

## 2015-10-12 ENCOUNTER — Encounter: Payer: Self-pay | Admitting: *Deleted

## 2015-10-12 ENCOUNTER — Other Ambulatory Visit (HOSPITAL_BASED_OUTPATIENT_CLINIC_OR_DEPARTMENT_OTHER): Payer: 59

## 2015-10-12 ENCOUNTER — Encounter: Payer: Self-pay | Admitting: Hematology and Oncology

## 2015-10-12 ENCOUNTER — Ambulatory Visit (HOSPITAL_BASED_OUTPATIENT_CLINIC_OR_DEPARTMENT_OTHER): Payer: 59

## 2015-10-12 ENCOUNTER — Telehealth: Payer: Self-pay | Admitting: Hematology and Oncology

## 2015-10-12 VITALS — BP 122/80 | HR 87 | Temp 98.0°F | Resp 18 | Ht 62.0 in | Wt 147.2 lb

## 2015-10-12 DIAGNOSIS — C50411 Malignant neoplasm of upper-outer quadrant of right female breast: Secondary | ICD-10-CM | POA: Diagnosis not present

## 2015-10-12 DIAGNOSIS — Z5112 Encounter for antineoplastic immunotherapy: Secondary | ICD-10-CM

## 2015-10-12 LAB — COMPREHENSIVE METABOLIC PANEL
ALT: 38 U/L (ref 0–55)
ANION GAP: 11 meq/L (ref 3–11)
AST: 22 U/L (ref 5–34)
Albumin: 4 g/dL (ref 3.5–5.0)
Alkaline Phosphatase: 90 U/L (ref 40–150)
BILIRUBIN TOTAL: 0.37 mg/dL (ref 0.20–1.20)
BUN: 14.2 mg/dL (ref 7.0–26.0)
CHLORIDE: 107 meq/L (ref 98–109)
CO2: 24 meq/L (ref 22–29)
Calcium: 9.7 mg/dL (ref 8.4–10.4)
Creatinine: 0.8 mg/dL (ref 0.6–1.1)
EGFR: 90 mL/min/{1.73_m2} — AB (ref >90)
GLUCOSE: 163 mg/dL — AB (ref 70–140)
POTASSIUM: 3.5 meq/L (ref 3.5–5.1)
SODIUM: 142 meq/L (ref 136–145)
Total Protein: 7.9 g/dL (ref 6.4–8.3)

## 2015-10-12 LAB — CBC WITH DIFFERENTIAL/PLATELET
BASO%: 0.4 % (ref 0.0–2.0)
Basophils Absolute: 0 10*3/uL (ref 0.0–0.1)
EOS%: 1.5 % (ref 0.0–7.0)
Eosinophils Absolute: 0.1 10*3/uL (ref 0.0–0.5)
HEMATOCRIT: 36.6 % (ref 34.8–46.6)
HEMOGLOBIN: 12.3 g/dL (ref 11.6–15.9)
LYMPH#: 1.8 10*3/uL (ref 0.9–3.3)
LYMPH%: 36.8 % (ref 14.0–49.7)
MCH: 31.2 pg (ref 25.1–34.0)
MCHC: 33.6 g/dL (ref 31.5–36.0)
MCV: 92.9 fL (ref 79.5–101.0)
MONO#: 0.3 10*3/uL (ref 0.1–0.9)
MONO%: 6.7 % (ref 0.0–14.0)
NEUT%: 54.6 % (ref 38.4–76.8)
NEUTROS ABS: 2.6 10*3/uL (ref 1.5–6.5)
Platelets: 364 10*3/uL (ref 145–400)
RBC: 3.94 10*6/uL (ref 3.70–5.45)
RDW: 13.5 % (ref 11.2–14.5)
WBC: 4.8 10*3/uL (ref 3.9–10.3)

## 2015-10-12 MED ORDER — TRASTUZUMAB CHEMO INJECTION 440 MG
8.0000 mg/kg | Freq: Once | INTRAVENOUS | Status: AC
  Administered 2015-10-12: 546 mg via INTRAVENOUS
  Filled 2015-10-12: qty 26

## 2015-10-12 MED ORDER — ACETAMINOPHEN 325 MG PO TABS
650.0000 mg | ORAL_TABLET | Freq: Once | ORAL | Status: AC
  Administered 2015-10-12: 650 mg via ORAL

## 2015-10-12 MED ORDER — DIPHENHYDRAMINE HCL 25 MG PO CAPS
50.0000 mg | ORAL_CAPSULE | Freq: Once | ORAL | Status: AC
  Administered 2015-10-12: 50 mg via ORAL

## 2015-10-12 MED ORDER — SODIUM CHLORIDE 0.9 % IV SOLN
Freq: Once | INTRAVENOUS | Status: AC
Start: 2015-10-12 — End: 2015-10-12
  Administered 2015-10-12: 10:00:00 via INTRAVENOUS

## 2015-10-12 MED ORDER — ACETAMINOPHEN 325 MG PO TABS
ORAL_TABLET | ORAL | Status: AC
  Filled 2015-10-12: qty 2

## 2015-10-12 MED ORDER — DIPHENHYDRAMINE HCL 25 MG PO CAPS
ORAL_CAPSULE | ORAL | Status: AC
  Filled 2015-10-12: qty 2

## 2015-10-12 NOTE — Progress Notes (Signed)
Unable to get in to exam room prior to MD.  No assessment performed.  

## 2015-10-12 NOTE — Progress Notes (Signed)
Patient Care Team: Olin Hauser, DO as PCP - General (Osteopathic Medicine)  SUMMARY OF ONCOLOGIC HISTORY:   Breast cancer of upper-outer quadrant of right female breast ([place])   08/10/2015 Breast MRI Enhancing mass in right breast upper outer quadrant 8 x 5 x 7 mm, no retroareolar right breast abnormality, no abnormality of the site of nipple retraction   08/18/2015 Initial Biopsy Right breast biopsy: Invasive ductal carcinoma with DCIS, grade 2 with focal necrosis,ER 100%, PR 0%, Ki-67 5%,HER-2 positive ratio 4.52   08/19/2015 Mammogram Right breast calcifications now seem to span 4 x 2 x 1.5 cm   09/15/2015 Surgery Rt mastectomy: IDC Grade 2, 0.18 cm, with DCIS, 0/1 LN, ER 100%, PR 0%, HER2 Positive 4.52, T1aN0M0 (Stage 1A)   10/12/2015 -  Chemotherapy Herceptin every 3 weeks 3 cycles (based on [facility] trial: Short herceptin protocol)    CHIEF COMPLIANT: cycle 1 Herceptin  INTERVAL HISTORY: Nichole Wilson is a 4. Over the above-mentioned history right breast cancer currently here to receive cycle 1 of Herceptin treatment. She had an echocardiogram which showed an ejection fraction 60-65%. This decision regarding short infusion of Herceptin treatment was a very complex one. Ideally we would like to give Herceptin combination of chemotherapy. Because of the small tumor size, chemotherapy was felt to be excessive treatment. But patient didn't want to receive anti-HER-2 based therapy. It was ultimately decided to give her a short course of Herceptin.  REVIEW OF SYSTEMS:   Constitutional: Denies fevers, chills or abnormal weight loss Eyes: Denies blurriness of vision Ears, nose, mouth, throat, and face: Denies mucositis or sore throat Respiratory: Denies cough, dyspnea or wheezes Cardiovascular: Denies palpitation, chest discomfort Gastrointestinal:  Denies nausea, heartburn or change in bowel habits Skin: Denies abnormal skin rashes Lymphatics: Denies new lymphadenopathy or easy  bruising Neurological:Denies numbness, tingling or new weaknesses Behavioral/Psych: Mood is stable, no new changes  Extremities: No lower extremity edema Breast:  denies any pain or lumps or nodules in either breasts All other systems were reviewed with the patient and are negative.  I have reviewed the past medical history, past surgical history, social history and family history with the patient and they are unchanged from previous note.  ALLERGIES:  has No Known Allergies.  MEDICATIONS:  Current Outpatient Prescriptions  Medication Sig Dispense Refill  . acetaminophen (TYLENOL) 500 MG tablet Take 1,000 mg by mouth every 8 (eight) hours as needed for mild pain.    [name] aspirin 81 MG tablet Take 81 mg by mouth daily.    . fenofibrate 160 MG tablet Take 160 mg by mouth daily.    . hydrochlorothiazide (HYDRODIURIL) 12.5 MG tablet Take 1 tablet (12.5 mg total) by mouth daily. 90 tablet 3  . HYDROcodone-acetaminophen (NORCO/VICODIN) 5-325 MG tablet Take 1-2 tablets by mouth every 6 (six) hours as needed for moderate pain or severe pain. (Patient not taking: Reported on 09/27/2015) 30 tablet 0  . Icosapent Ethyl 1 g CAPS Take 2 g by mouth 2 (two) times daily with a meal. 120 capsule 5  . metFORMIN (GLUCOPHAGE) 500 MG tablet Take 500 mg by mouth daily.    . Multiple Vitamin (MULTIVITAMIN WITH MINERALS) TABS Take 1 tablet by mouth daily.     No current facility-administered medications for this visit.    PHYSICAL EXAMINATION: ECOG PERFORMANCE STATUS: 0 - Asymptomatic  Filed Vitals:   10/12/15 0938  BP: 122/80  Pulse: 87  Temp: 98 F (36.7 C)  Resp: 18  Filed Weights   10/12/15 0938  Weight: 147 lb 3.2 oz (66.769 kg)    GENERAL:alert, no distress and comfortable SKIN: skin color, texture, turgor are normal, no rashes or significant lesions EYES: normal, Conjunctiva are pink and non-injected, sclera clear OROPHARYNX:no exudate, no erythema and lips, buccal mucosa, and tongue normal    NECK: supple, thyroid normal size, non-tender, without nodularity LYMPH:  no palpable lymphadenopathy in the cervical, axillary or inguinal LUNGS: clear to auscultation and percussion with normal breathing effort HEART: regular rate & rhythm and no murmurs and no lower extremity edema ABDOMEN:abdomen soft, non-tender and normal bowel sounds MUSCULOSKELETAL:no cyanosis of digits and no clubbing  NEURO: alert & oriented x 3 with fluent speech, no focal motor/sensory deficits EXTREMITIES: No lower extremity edema BREAST: No palpable masses or nodules in either right or left breasts. No palpable axillary supraclavicular or infraclavicular adenopathy no breast tenderness or nipple discharge. (exam performed in the presence of a chaperone)  LABORATORY DATA:  I have reviewed the data as listed   Chemistry      Component Value Date/Time   NA 142 10/12/2015 0909   NA 143 09/14/2015 0932   K 3.5 10/12/2015 0909   K 3.9 09/14/2015 0932   CL 108 09/14/2015 0932   CO2 24 10/12/2015 0909   CO2 25 09/14/2015 0932   BUN 14.2 10/12/2015 0909   BUN 14 09/14/2015 0932   CREATININE 0.8 10/12/2015 0909   CREATININE 0.62 09/14/2015 0932   CREATININE 0.52 05/12/2015 1117      Component Value Date/Time   CALCIUM 9.7 10/12/2015 0909   CALCIUM 9.7 09/14/2015 0932   ALKPHOS 90 10/12/2015 0909   ALKPHOS 71 09/14/2015 0932   AST 22 10/12/2015 0909   AST 32 09/14/2015 0932   ALT 38 10/12/2015 0909   ALT 36 09/14/2015 0932   BILITOT 0.37 10/12/2015 0909   BILITOT 0.6 09/14/2015 0932     Lab Results  Component Value Date   WBC 4.8 10/12/2015   HGB 12.3 10/12/2015   HCT 36.6 10/12/2015   MCV 92.9 10/12/2015   PLT 364 10/12/2015   NEUTROABS 2.6 10/12/2015   ASSESSMENT & PLAN:  Breast cancer of upper-outer quadrant of right female breast ([place]) Rt mastectomy 09/15/15: IDC Grade 2, 0.18 cm, with DCIS, 0/1 LN, ER 100%, PR 0%, HER2 Positive 4.52, T1aN0M0 (Stage 1A) Final tumor size: 0.35 cm on biopsy  +0.18 cm on final lumpectomy equals 0.53 cm  Treatment plan: 1. shorter duration of anti-HER-2 therapy based upon [facility] trial where they used only 9 weeks of Herceptin treatment. Herceptin q 3 weeks 3 cycles.  2. Adjuvant antiestrogen therapy with anastrozole 1 mg daily  Echocardiogram 10/01/2015: EF 60-65%  Current treatment: Today cycle 1 day 1 Herceptin We will be monitoring closely for toxicities. Return to Clinic in 3 weeks for cycle 2      No orders of the defined types were placed in this encounter.   The patient has a good understanding of the overall plan. she agrees with it. she will call with any problems that may develop before the next visit here.   Rulon Eisenmenger, MD 10/12/2015

## 2015-10-12 NOTE — Patient Instructions (Signed)
[facility] Discharge Instructions for Patients Receiving Chemotherapy  Today you received the following chemotherapy agents:  Herceptin  To help prevent nausea and vomiting after your treatment, we encourage you to take your nausea medication as prescribed.   If you develop nausea and vomiting that is not controlled by your nausea medication, call the clinic.   BELOW ARE SYMPTOMS THAT SHOULD BE REPORTED IMMEDIATELY:  *FEVER GREATER THAN 100.5 F  *CHILLS WITH OR WITHOUT FEVER  NAUSEA AND VOMITING THAT IS NOT CONTROLLED WITH YOUR NAUSEA MEDICATION  *UNUSUAL SHORTNESS OF BREATH  *UNUSUAL BRUISING OR BLEEDING  TENDERNESS IN MOUTH AND THROAT WITH OR WITHOUT PRESENCE OF ULCERS  *URINARY PROBLEMS  *BOWEL PROBLEMS  UNUSUAL RASH Items with * indicate a potential emergency and should be followed up as soon as possible.  Feel free to call the clinic you have any questions or concerns. The clinic phone number is [phone].  Please show the CHEMO ALERT CARD at check-in to the Emergency Department and triage nurse.   

## 2015-10-12 NOTE — Telephone Encounter (Signed)
appt made and avs printed °

## 2015-10-12 NOTE — Anesthesia Preprocedure Evaluation (Signed)
Anesthesia Evaluation  Patient identified by MRN, date of birth, ID band Patient awake    Reviewed: Allergy & Precautions, NPO status , Patient's Chart, lab work & pertinent test results  History of Anesthesia Complications (+) PONV and history of anesthetic complications  Airway Mallampati: II  TM Distance: >3 FB Neck ROM: Full    Dental no notable dental hx.    Pulmonary neg pulmonary ROS,    Pulmonary exam normal breath sounds clear to auscultation       Cardiovascular hypertension, Pt. on medications Normal cardiovascular exam Rhythm:Regular Rate:Normal     Neuro/Psych  Headaches,  Neuromuscular disease negative psych ROS   GI/Hepatic negative GI ROS, Neg liver ROS,   Endo/Other  negative endocrine ROS  Renal/GU negative Renal ROS  negative genitourinary   Musculoskeletal negative musculoskeletal ROS (+)   Abdominal   Peds negative pediatric ROS (+)  Hematology negative hematology ROS (+)   Anesthesia Other Findings   Reproductive/Obstetrics negative OB ROS                             Anesthesia Physical Anesthesia Plan  ASA: II  Anesthesia Plan: MAC   Post-op Pain Management:    Induction: Intravenous  Airway Management Planned: Natural Airway  Additional Equipment:   Intra-op Plan:   Post-operative Plan:   Informed Consent: I have reviewed the patients History and Physical, chart, labs and discussed the procedure including the risks, benefits and alternatives for the proposed anesthesia with the patient or authorized representative who has indicated his/her understanding and acceptance.   Dental advisory given  Plan Discussed with: CRNA  Anesthesia Plan Comments:         Anesthesia Quick Evaluation

## 2015-10-12 NOTE — Assessment & Plan Note (Signed)
Rt mastectomy 09/15/15: IDC Grade 2, 0.18 cm, with DCIS, 0/1 LN, ER 100%, PR 0%, HER2 Positive 4.52, T1aN0M0 (Stage 1A) Final tumor size: 0.35 cm on biopsy +0.18 cm on final lumpectomy equals 0.53 cm  Treatment plan: 1. shorter duration of anti-HER-2 therapy based upon [facility] trial where they used only 9 weeks of Herceptin treatment. Herceptin q 3 weeks 3 cycles.  2. Adjuvant antiestrogen therapy with anastrozole 1 mg daily  Echocardiogram 10/01/2015: EF 60-65%  Current treatment: Today cycle 1 day 1 Herceptin We will be monitoring closely for toxicities. Return to Clinic in 3 weeks for cycle 2

## 2015-10-13 ENCOUNTER — Encounter (HOSPITAL_COMMUNITY)
Admission: RE | Disposition: A | Discharge status: Home / Self Care | Payer: Self-pay | Source: Ambulatory Visit | Attending: Gastroenterology

## 2015-10-13 ENCOUNTER — Encounter (HOSPITAL_COMMUNITY): Payer: Self-pay

## 2015-10-13 ENCOUNTER — Ambulatory Visit (HOSPITAL_COMMUNITY): Payer: 59 | Admitting: Anesthesiology

## 2015-10-13 ENCOUNTER — Ambulatory Visit (HOSPITAL_COMMUNITY)
Admission: RE | Admit: 2015-10-13 | Discharge: 2015-10-13 | Disposition: A | Discharge status: Home / Self Care | Payer: 59 | Source: Ambulatory Visit | Attending: Gastroenterology | Admitting: Gastroenterology

## 2015-10-13 ENCOUNTER — Telehealth: Payer: Self-pay

## 2015-10-13 DIAGNOSIS — Z79899 Other long term (current) drug therapy: Secondary | ICD-10-CM | POA: Diagnosis not present

## 2015-10-13 DIAGNOSIS — Z7982 Long term (current) use of aspirin: Secondary | ICD-10-CM | POA: Diagnosis not present

## 2015-10-13 DIAGNOSIS — K929 Disease of digestive system, unspecified: Secondary | ICD-10-CM | POA: Diagnosis not present

## 2015-10-13 DIAGNOSIS — R935 Abnormal findings on diagnostic imaging of other abdominal regions, including retroperitoneum: Secondary | ICD-10-CM | POA: Diagnosis not present

## 2015-10-13 DIAGNOSIS — Z79891 Long term (current) use of opiate analgesic: Secondary | ICD-10-CM | POA: Insufficient documentation

## 2015-10-13 DIAGNOSIS — R933 Abnormal findings on diagnostic imaging of other parts of digestive tract: Secondary | ICD-10-CM | POA: Diagnosis not present

## 2015-10-13 DIAGNOSIS — K3189 Other diseases of stomach and duodenum: Secondary | ICD-10-CM | POA: Insufficient documentation

## 2015-10-13 DIAGNOSIS — Z7984 Long term (current) use of oral hypoglycemic drugs: Secondary | ICD-10-CM | POA: Diagnosis not present

## 2015-10-13 DIAGNOSIS — K319 Disease of stomach and duodenum, unspecified: Secondary | ICD-10-CM | POA: Diagnosis not present

## 2015-10-13 DIAGNOSIS — Z853 Personal history of malignant neoplasm of breast: Secondary | ICD-10-CM | POA: Insufficient documentation

## 2015-10-13 DIAGNOSIS — I10 Essential (primary) hypertension: Secondary | ICD-10-CM | POA: Diagnosis not present

## 2015-10-13 HISTORY — PX: EUS: SHX5427

## 2015-10-13 LAB — GLUCOSE, CAPILLARY: Glucose-Capillary: 109 mg/dL — ABNORMAL HIGH (ref 65–99)

## 2015-10-13 SURGERY — UPPER ENDOSCOPIC ULTRASOUND (EUS) RADIAL
Anesthesia: Monitor Anesthesia Care

## 2015-10-13 MED ORDER — CIPROFLOXACIN IN D5W 400 MG/200ML IV SOLN
400.0000 mg | Freq: Once | INTRAVENOUS | Status: AC
  Administered 2015-10-13: 400 mg via INTRAVENOUS

## 2015-10-13 MED ORDER — PROPOFOL 10 MG/ML IV BOLUS
INTRAVENOUS | Status: AC
  Filled 2015-10-13: qty 20

## 2015-10-13 MED ORDER — SODIUM CHLORIDE 0.9 % IV SOLN: INTRAVENOUS | Status: DC

## 2015-10-13 MED ORDER — PROPOFOL 10 MG/ML IV BOLUS
INTRAVENOUS | Status: AC
  Filled 2015-10-13: qty 40

## 2015-10-13 MED ORDER — CIPROFLOXACIN HCL 500 MG PO TABS: 500.0000 mg | ORAL_TABLET | Freq: Two times a day (BID) | ORAL | Status: DC

## 2015-10-13 MED ORDER — PROPOFOL 10 MG/ML IV BOLUS
INTRAVENOUS | Status: DC | PRN
  Administered 2015-10-13 (×8): 20 mg via INTRAVENOUS
  Administered 2015-10-13: 10 mg via INTRAVENOUS
  Administered 2015-10-13 (×5): 20 mg via INTRAVENOUS
  Administered 2015-10-13: 30 mg via INTRAVENOUS
  Administered 2015-10-13: 20 mg via INTRAVENOUS
  Administered 2015-10-13: 10 mg via INTRAVENOUS
  Administered 2015-10-13 (×5): 20 mg via INTRAVENOUS
  Administered 2015-10-13: 10 mg via INTRAVENOUS

## 2015-10-13 MED ORDER — CIPROFLOXACIN IN D5W 400 MG/200ML IV SOLN
INTRAVENOUS | Status: AC
  Filled 2015-10-13: qty 200

## 2015-10-13 MED ORDER — LACTATED RINGERS IV SOLN
INTRAVENOUS | Status: DC | PRN
  Administered 2015-10-13: 09:00:00 via INTRAVENOUS

## 2015-10-13 MED FILL — CIPROFLOXACIN HCL 500 MG TA: 500 | 5 days supply | Qty: 10 | Fill #0

## 2015-10-13 NOTE — Telephone Encounter (Signed)
LMOVM - Checking on pt after 1st chemo.  Pt to call the clinic with any questions or concerns.

## 2015-10-13 NOTE — Transfer of Care (Signed)
Immediate Anesthesia Transfer of Care Note  Patient: Nichole Wilson  Procedure(s) Performed: Procedure(s): UPPER ENDOSCOPIC ULTRASOUND (EUS) RADIAL (N/A)  Patient Location: PACU  Anesthesia Type:MAC  Level of Consciousness: sedated  Airway & Oxygen Therapy: Patient Spontanous Breathing and Patient connected to nasal cannula oxygen  Post-op Assessment: Report given to RN and Post -op Vital signs reviewed and stable  Post vital signs: Reviewed and stable  Last Vitals:  Filed Vitals:   10/13/15 0849 10/13/15 1000  BP: 135/79 97/66  Pulse: 97 86  Temp: 37.1 C 36.5 C  Resp: 14 20    Complications: No apparent anesthesia complications

## 2015-10-13 NOTE — Op Note (Signed)
[facility] Patient Name: Nichole Wilson Procedure Date: 10/13/2015 MRN: [id number] Attending MD: Arta Silence , MD Date of Birth: 04/06/1961 CSN:  Age: 55 Admit Type: Outpatient Procedure:                Upper EUS Indications:              Gastric deformity on endoscopy/Subepithelial tumor                            versus extrinsic compression, Abnormal                            abdominal/pelvic CT scan, Submucosal tumor versus                            extrinsic mass found on endoscopy Providers:                Arta Silence, MD, Laverta Baltimore, RN, Brianca Fortenberry Dalton, Technician Referring MD:              Medicines:                Propofol per Anesthesia, Cipro [id number] mg IV Complications:            No immediate complications. Estimated Blood Loss:     Estimated blood loss was minimal. Procedure:                Pre-Anesthesia Assessment:                           - Prior to the procedure, a History and Physical                            was performed, and patient medications and                            allergies were reviewed. The patient's tolerance of                            previous anesthesia was also reviewed. The risks                            and benefits of the procedure and the sedation                            options and risks were discussed with the patient.                            All questions were answered, and informed consent                            was obtained. Prior Anticoagulants: The patient has                            taken  no previous anticoagulant or antiplatelet                            agents. ASA Grade Assessment: II - A patient with                            mild systemic disease. After reviewing the risks                            and benefits, the patient was deemed in                            satisfactory condition to undergo the procedure.                           After obtaining  informed consent, the endoscope was                            passed under direct vision. Throughout the                            procedure, the patient's blood pressure, pulse, and                            oxygen saturations were monitored continuously. The                            [id number]) scope was introduced through                            the mouth, and advanced to the stomach for                            ultrasound examination from the esophagus and                            stomach. The [id number]) scope was                            introduced through the mouth, and advanced to the                            stomach for ultrasound examination from the                            esophagus and stomach. The upper EUS was                            accomplished without difficulty. The patient                            tolerated the procedure well. Scope In: Scope Out: Findings:      Endoscopic Finding :      A single 25 mm submucosal papule (nodule)  was found in the cardia.      Endosonographic Finding :      There was no sign of significant endosonographic abnormality in the       middle third of the esophagus and in the lower third of the esophagus.      There was no sign of significant endosonographic abnormality in the left       lobe of the liver.      There was no sign of significant endosonographic abnormality in the genu       of the pancreas, in the pancreatic body and in the pancreatic tail.      A lobulated intramural (subepithelial) lesion was found in the cardia of       the stomach. It was encountered at 2 cm distal to the gastroesophageal       junction. The lesion was heterogenous and had some cystic versus       necrotic regions; some regions of focal hyperechoic nature, possibly       small calcifications. Sonographically, the lesion appeared to originate       from the submucosa (Layer 3). The lesion measured 25 mm x 27 mm in        diameter. The outer endosonographic borders were well defined, overlying       mucosa appeared normal. Fine needle aspiration for cytology was       performed. Color Doppler imaging was utilized prior to needle puncture       to confirm a lack of significant vascular structures within the needle       path. Five passes were made with the 22 gauge needle using a       transgastric approach. A cytotechnologist was present to evaluate the       adequacy of the specimen. Final cytology results are pending.      No lymphadenopathy seen.      The region of the celiac plexus and celiac ganglia was visualized and       showed no sign of significant endosonographic abnormality. The vascular       anatomy of the region was normal. Impression:               - A single submucosal papule (nodule) found in the                            stomach, cardia region, most consistent with                            leiomyoma versus GI stromal tumor (GIST).                           - There was no sign of significant pathology in the                            middle third of the esophagus and in the lower                            third of the esophagus.                           - There was no evidence of significant pathology in  the left lobe of the liver.                           - There was no sign of significant pathology in the                            genu of the pancreas, in the pancreatic body and in                            the pancreatic tail.                           - An intramural (subepithelial) lesion was found in                            the cardia of the stomach. The lesion appeared to                            originate from within the submucosa (Layer 3).                            Cytology results are pending. However, the                            endosonographic appearance is consistent with a                            benign stromal cell (smooth  muscle) neoplasm versus                            leiomyoma. Fine needle aspiration performed. Moderate Sedation:      None Recommendation:           - Discharge patient to home (via wheelchair).                           - Resume previous diet today.                           - No aspirin, ibuprofen, naproxen, or other                            non-steroidal anti-inflammatory drugs for 3 days                            after biopsy.                           - Await cytology results.                           - Return to GI clinic after studies are complete.                           - Return to referring physician as previously  scheduled. Procedure Code(s):        --- Professional ---                           [phone], Esophagogastroduodenoscopy, flexible,                            transoral; with transendoscopic ultrasound-guided                            intramural or transmural fine needle                            aspiration/biopsy(s) (includes endoscopic                            ultrasound examination of the esophagus, stomach,                            and either the duodenum or a surgically altered                            stomach where the jejunum is examined distal to the                            anastomosis) Diagnosis Code(s):        --- Professional ---                           K31.89, Other diseases of stomach and duodenum                           K92.9, Disease of digestive system, unspecified                           R93.5, Abnormal findings on diagnostic imaging of                            other abdominal regions, including retroperitoneum CPT copyright 2016 [employer]. All rights reserved. The codes documented in this report are preliminary and upon coder review may  be revised to meet current compliance requirements. Arta Silence, MD Arta Silence, MD 10/13/2015 10:05:40 AM This report has been signed  electronically. Number of Addenda: 0

## 2015-10-13 NOTE — H&P (Signed)
Patient interval history reviewed.  Patient examined again.  There has been no change from documented H/P dated 09/22/15 (scanned into chart from our office) except as documented above.  Assessment:  1.  Gastric submucosal-appearing nodule.  Suspect leiomyoma versus GIST.  Plan:  1.  Endoscopic ultrasound with possible fine needle aspiration (FNA) biopsies. 2.  Risks (bleeding, infection, bowel perforation that could require surgery, sedation-related changes in cardiopulmonary systems), benefits (identification and possible treatment of source of symptoms, exclusion of certain causes of symptoms), and alternatives (watchful waiting, radiographic imaging studies, empiric medical treatment) of upper endoscopy with ultrasound and possible biopsies (EUS +/- FNA) were explained to patient/family in detail and patient wishes to proceed.

## 2015-10-13 NOTE — Discharge Instructions (Signed)

## 2015-10-13 NOTE — Anesthesia Postprocedure Evaluation (Signed)
Anesthesia Post Note  Patient: Nichole Wilson  Procedure(s) Performed: Procedure(s) (LRB): UPPER ENDOSCOPIC ULTRASOUND (EUS) RADIAL (N/A)  Patient location during evaluation: PACU Anesthesia Type: MAC Level of consciousness: awake and alert Pain management: pain level controlled Vital Signs Assessment: post-procedure vital signs reviewed and stable Respiratory status: spontaneous breathing, nonlabored ventilation, respiratory function stable and patient connected to nasal cannula oxygen Cardiovascular status: stable and blood pressure returned to baseline Anesthetic complications: no    Last Vitals:  Filed Vitals:   10/13/15 1021 10/13/15 1032  BP: 85/58 103/67  Pulse: 81 81  Temp:    Resp: 19 20    Last Pain: There were no vitals filed for this visit.               Bert Ptacek J

## 2015-10-14 ENCOUNTER — Ambulatory Visit: Payer: 59 | Admitting: Physical Therapy

## 2015-10-14 ENCOUNTER — Telehealth: Payer: Self-pay

## 2015-10-14 NOTE — Telephone Encounter (Signed)
-----   Message from Renford Dills, RN sent at 10/12/2015 11:38 AM EDT ----- Regarding: chemo f/u [facility] 1st Herceptin

## 2015-10-14 NOTE — Telephone Encounter (Signed)
Writer called patient today to see how she is feeling following 1st herceptin infusion.  Patient states outside of feeling fatigued she does not report any side effects or problems.

## 2015-10-15 ENCOUNTER — Ambulatory Visit: Payer: 59 | Admitting: Physical Therapy

## 2015-10-15 ENCOUNTER — Encounter (HOSPITAL_COMMUNITY): Payer: Self-pay | Admitting: Gastroenterology

## 2015-10-15 DIAGNOSIS — M25511 Pain in right shoulder: Secondary | ICD-10-CM

## 2015-10-15 DIAGNOSIS — M25611 Stiffness of right shoulder, not elsewhere classified: Secondary | ICD-10-CM

## 2015-10-15 DIAGNOSIS — R222 Localized swelling, mass and lump, trunk: Secondary | ICD-10-CM | POA: Diagnosis not present

## 2015-10-15 DIAGNOSIS — Z9189 Other specified personal risk factors, not elsewhere classified: Secondary | ICD-10-CM | POA: Diagnosis not present

## 2015-10-15 DIAGNOSIS — R609 Edema, unspecified: Secondary | ICD-10-CM | POA: Diagnosis not present

## 2015-10-15 NOTE — Patient Instructions (Signed)
  Shoulder Rotation: Double Arm   On back, knees bent, feet flat, elbows tucked at sides, bent 90, hands palms up. Pull hands apart and down toward floor, keeping elbows near sides. Hold momentarily. Slowly return to starting position. Can also do in sitting and standing as long as you are tall as you can be with belly button to the spine  Repeat _5-10__ times. Band color ___yellow ___

## 2015-10-15 NOTE — Therapy (Signed)
[place], [place], [address] Phone: [phone]   Fax:  [phone]  Physical Therapy Treatment  Patient Details  Name: Nichole Wilson MRN: [id number] Date of Birth: September 17, 1960 Referring Provider: Rolm Bookbinder   Encounter Date: 10/15/2015      PT End of Session - 10/15/15 1147    Visit Number 5   Number of Visits 9   Date for PT Re-Evaluation 11/01/15   PT Start Time 0845   PT Stop Time 0930   PT Time Calculation (min) 45 min   Activity Tolerance Patient tolerated treatment well   Behavior During Therapy [facility] for tasks assessed/performed      Past Medical History  Diagnosis Date  . Hypertriglyceridemia   . Hepatic steatosis   . Breast cyst, left 01-04-12    cyst no longer apparent  . Migraine without aura, without mention of intractable migraine without mention of status migrainosus   . Hypercholesterolemia with hypertriglyceridemia   . Cholelithiasis with choledocholithiasis   . Glucose intolerance (impaired glucose tolerance)     since at least 2011.   . Gallstone pancreatitis   . Hypertension 01-04-12    med d/c-last taken 12-07-11  -PT [place] STOPPED HER B/P MEDICATION BECAUSE HER B/P TOO LOW AND PT STATES HER B/P'S HAVE BEEN OK SINCE.  [name] Complication of anesthesia 2002    PT STATES DURING HER C-SECTION SHE WAS AWARE OF INCISION AND PAIN -THEN WAS QUICKLY "PUT OUT"  . Chronic calculus cholecystitis 12/25/2011    Lap chole on 03/25/12   . PONV (postoperative nausea and vomiting)   . Pre-diabetes   . Cancer [facility])     right breast cancer    Past Surgical History  Procedure Laterality Date  . Cesarean section      2002  . Ercp  12/05/2011    Procedure: ENDOSCOPIC RETROGRADE CHOLANGIOPANCREATOGRAPHY (ERCP);  Surgeon: Inda Castle, MD;  Location: [facility];  Service: Endoscopy;  Laterality: N/A;  . Ercp  12/08/2011    Procedure: ENDOSCOPIC RETROGRADE  CHOLANGIOPANCREATOGRAPHY (ERCP);  Surgeon: Inda Castle, MD;  Location: [place];  Service: Gastroenterology;  Laterality: N/A;  . Cholecystectomy  03/25/2012    Procedure: LAPAROSCOPIC CHOLECYSTECTOMY WITH INTRAOPERATIVE CHOLANGIOGRAM;  Surgeon: Haywood Lasso, MD;  [facility];  Service: General;  Laterality: N/A;  Laparoscopic Cholecystectomy with Intraoperative Cholangiogram  . Mastectomy w/ sentinel node biopsy Right 09/15/2015    Procedure: RIGHT MASTECTOMY WITH SENTINEL LYMPH NODE BIOPSY;  Surgeon: Rolm Bookbinder, MD;  Location: [place];  Service: General;  Laterality: Right;  . Esophagogastroduodenoscopy (egd) with propofol N/A 09/28/2015    Procedure: ESOPHAGOGASTRODUODENOSCOPY (EGD) WITH PROPOFOL;  Surgeon: Clarene Essex, MD;  Location: [facility] ENDOSCOPY;  Service: Endoscopy;  Laterality: N/A;  H AND P IN DRAWER  . Eus N/A 10/13/2015    Procedure: UPPER ENDOSCOPIC ULTRASOUND (EUS) RADIAL;  Surgeon: Arta Silence, MD;  [facility];  Service: Endoscopy;  Laterality: N/A;    There were no vitals filed for this visit.  Visit Diagnosis:  Shoulder stiffness, right  Pain in joint of right shoulder  Localized swelling, mass and lump, trunk      Subjective Assessment - 10/15/15 0856    Subjective Pt had to have an endoscopy this week to check for a stomach polyp, doesn't know results  she did not sleep well today and appears like she doesn't feel weel    Pertinent History right breast cancer with mastetomy on 09/15/15  sentinel node biopsy. She has not chemotherapy or radiation and has not plans  for it    Patient Stated Goals to be able to riase her hand all the way and to get stronger    Currently in Pain? Yes   Pain Score --  did not rate    Pain Location Shoulder   Pain Orientation Right   Pain Descriptors / Indicators Dull   Pain Type Surgical pain   Pain Radiating Towards toward right arm and axilla    Pain Onset 1 to 4 weeks ago   Pain Frequency Intermittent             [facility] PT Assessment - 10/15/15 0001    Observation/Other Assessments   Observations pt has full area at lateral point of axillary incision that is above where her bra ends                       [facility] Adult PT Treatment/Exercise - 10/15/15 0001    Self-Care   Self-Care Other Self-Care Comments   Other Self-Care Comments  provided chip pack to wear inside bra at [place] over full area    Shoulder Exercises: Supine   Protraction AROM;Right;10 reps   Protraction Limitations verbal cues to keep elbow straight and scapular with scapula retracted    External Rotation Strengthening;Both;10 reps  in supine and sitting    Theraband Level (Shoulder External Rotation) Level 1 (Yellow)   Other Supine Exercises dowel rod exercise for flexion and abduction stretch    Shoulder Exercises: Pulleys   Flexion 1 minute   Flexion Limitations pt keeps shoulder forward and hiked during exercise so it was stopped    Manual Therapy   Manual Lymphatic Drainage (MLD) briefly in supine and sidelying : anterior interaxillary anatamosis, right anterior axilla,and lateral chest toward inguinal nodes    Passive ROM In Supine to Rt sholder into flexion, abuction, and ER/IR all to pts tolerance.                 PT Education - 10/15/15 1146    Education provided Yes   Education Details reinforced supine shoulder exercise    Person(s) Educated Patient   Methods Explanation;Demonstration;Handout   Comprehension Verbalized understanding;Returned demonstration                [facility] Goals - 10/07/15 1155    CC Long Term Goal  #1   Title Patient with verbalize an understanding of lymphedema risk reduction precautions   Status On-going   CC Long Term Goal  #2   Title Patient will be independent in a home exercise program   Status On-going   CC Long Term Goal  #3   Title Patient will improve left shoulder abduction to 120 degrees so that she can achieve position  needed for radiation therapy.   Status On-going   CC Long Term Goal  #4   Title Patient will improve right  shoulder abduction to 140 degrees so that she can return to previous functional level    Status On-going   CC Long Term Goal  #5   Title Patient will decrease the DASH score to < 20    to demonstrate increased functional use of upper extremity   Status On-going            Plan - 10/15/15 1147    Clinical Impression Statement   Pt continues to have limitatation in shoulder range of motion with positioning  of right scapula forward with localized edema at lateral incision.   Emphasized scapular retraction and strengthening as weill as edema contol today.    PT Next Visit Plan progress to [place] exercise with empahsis on posture. Assess effect of chip pack on axillary edema    Consulted and Agree with Plan of Care Patient        Problem List Patient Active Problem List   Diagnosis Date Noted  . Gastric polyp 09/13/2015  . Breast cancer of upper-outer quadrant of right female breast ([place]) 08/26/2015  . Breast calcification, right 07/27/2015  . Inversion, nipple 07/27/2015  . Fatigue 07/22/2015  . Anterior scleritis of both eyes 05/12/2015  . Overweight (BMI 25.0-29.9) 05/12/2015  . Pre-diabetes 05/12/2015  . Essential hypertension, benign 09/05/2013  . Plantar fasciitis 09/05/2013  . Health care maintenance 03/24/2013  . Epidermal cyst 06/17/2012  . Sciatica 05/17/2012  . Hepatic steatosis 01/02/2012  . Sweating 12/12/2011  . Calculus of bile duct without mention of cholecystitis or obstruction 12/05/2011  . Mastitis 10/19/2010  . Hypercholesterolemia with hypertriglyceridemia 02/24/2009  . HYPERGLYCEMIA 02/24/2009  . Asymptomatic postmenopausal status 02/23/2009  . COMMON MIGRAINE 01/09/2007  . HEMORRHOIDS, NOS 09/13/2006   Donato Heinz. Owens Shark, PT   10/15/2015, 11:53 AM  [place], [place], [address] Phone: [phone]   Fax:  [phone]  Name: Nichole Wilson MRN: [id number] Date of Birth: 10/06/1960

## 2015-10-19 ENCOUNTER — Ambulatory Visit: Payer: 59 | Attending: General Surgery | Admitting: Physical Therapy

## 2015-10-19 DIAGNOSIS — R222 Localized swelling, mass and lump, trunk: Secondary | ICD-10-CM | POA: Insufficient documentation

## 2015-10-19 DIAGNOSIS — M25511 Pain in right shoulder: Secondary | ICD-10-CM | POA: Diagnosis not present

## 2015-10-19 DIAGNOSIS — M25611 Stiffness of right shoulder, not elsewhere classified: Secondary | ICD-10-CM | POA: Insufficient documentation

## 2015-10-19 NOTE — Therapy (Signed)
[place], [place], [address] Phone: [phone]   Fax:  [phone]  Physical Therapy Treatment  Patient Details  Name: Nichole Wilson MRN: [id number] Date of Birth: [birth year] Referring Provider: Rolm Bookbinder   Encounter Date: 10/19/2015      PT End of Session - 10/19/15 1135    Visit Number 6   Number of Visits 9   Date for PT Re-Evaluation 11/01/15   PT Start Time 1025   PT Stop Time 1105   PT Time Calculation (min) 40 min   Activity Tolerance Patient tolerated treatment well   Behavior During Therapy [facility] for tasks assessed/performed      Past Medical History  Diagnosis Date  . Hypertriglyceridemia   . Hepatic steatosis   . Breast cyst, left 01-04-12    cyst no longer apparent  . Migraine without aura, without mention of intractable migraine without mention of status migrainosus   . Hypercholesterolemia with hypertriglyceridemia   . Cholelithiasis with choledocholithiasis   . Glucose intolerance (impaired glucose tolerance)     since at least 2011.   . Gallstone pancreatitis   . Hypertension 01-04-12    med d/c-last taken 12-07-11  -PT [place] STOPPED HER B/P MEDICATION BECAUSE HER B/P TOO LOW AND PT STATES HER B/P'S HAVE BEEN OK SINCE.  [name] Complication of anesthesia 2002    PT STATES DURING HER C-SECTION SHE WAS AWARE OF INCISION AND PAIN -THEN WAS QUICKLY "PUT OUT"  . Chronic calculus cholecystitis 12/25/2011    Lap chole on 03/25/12   . PONV (postoperative nausea and vomiting)   . Pre-diabetes   . Cancer [facility])     right breast cancer    Past Surgical History  Procedure Laterality Date  . Cesarean section      2002  . Ercp  12/05/2011    Procedure: ENDOSCOPIC RETROGRADE CHOLANGIOPANCREATOGRAPHY (ERCP);  Surgeon: Inda Castle, MD;  Location: [facility];  Service: Endoscopy;  Laterality: N/A;  . Ercp  12/08/2011    Procedure: ENDOSCOPIC RETROGRADE  CHOLANGIOPANCREATOGRAPHY (ERCP);  Surgeon: Inda Castle, MD;  Location: [place];  Service: Gastroenterology;  Laterality: N/A;  . Cholecystectomy  03/25/2012    Procedure: LAPAROSCOPIC CHOLECYSTECTOMY WITH INTRAOPERATIVE CHOLANGIOGRAM;  Surgeon: Haywood Lasso, MD;  [facility];  Service: General;  Laterality: N/A;  Laparoscopic Cholecystectomy with Intraoperative Cholangiogram  . Mastectomy w/ sentinel node biopsy Right 09/15/2015    Procedure: RIGHT MASTECTOMY WITH SENTINEL LYMPH NODE BIOPSY;  Surgeon: Rolm Bookbinder, MD;  Location: [place];  Service: General;  Laterality: Right;  . Esophagogastroduodenoscopy (egd) with propofol N/A 09/28/2015    Procedure: ESOPHAGOGASTRODUODENOSCOPY (EGD) WITH PROPOFOL;  Surgeon: Clarene Essex, MD;  Location: [facility] ENDOSCOPY;  Service: Endoscopy;  Laterality: N/A;  H AND P IN DRAWER  . Eus N/A 10/13/2015    Procedure: UPPER ENDOSCOPIC ULTRASOUND (EUS) RADIAL;  Surgeon: Arta Silence, MD;  [facility];  Service: Endoscopy;  Laterality: N/A;    There were no vitals filed for this visit.  Visit Diagnosis:  Shoulder stiffness, right  Pain in joint of right shoulder  Localized swelling, mass and lump, trunk      Subjective Assessment - 10/19/15 1030    Subjective "I'm tired today "   Pertinent History right breast cancer with mastetomy on 09/15/15 sentinel node biopsy. She has not chemotherapy or radiation and has not plans  for it    Patient Stated Goals to be able to riase her  hand all the way and to get stronger    Currently in Pain? No/denies  occasional shooting pain             [facility] PT Assessment - 10/19/15 0001    Observation/Other Assessments   Observations steri strips are beginning to fall off. Incisions are healing well with tightness developing especillay at area where scars intersect in [place]ar[place].  She continues to have fullness at lateral scar at anterior axilla  that appears to be a "dogear"    Posture/Postural Control    Posture/Postural Control Postural limitations   Postural Limitations Rounded Shoulders;Forward head   Posture Comments Right shoulder is more forward than left..  Large  protruding abdomen that pt. states she has had for a while .                     [place] Adult PT Treatment/Exercise - 10/19/15 0001    Shoulder Exercises: Supine   Other Supine Exercises dowel rod exercise for flexion and abduction stretch    Shoulder Exercises: Sidelying   External Rotation AROM;Right;10 reps  verbal cues to keep shoulder from hiking    ABduction AROM;Right;5 reps  encouraged stretching overhead    Other Sidelying Exercises scapular retraction    Shoulder Exercises: ROM/Strengthening   Other ROM/Strengthening Exercises standing shoulder extension strech with hand at doorway at hip level.    Manual Therapy   Manual Lymphatic Drainage (MLD) In supine, short neck, superficial and deep abdominals, right inguinal nodes with right axillopinguinal anastamosis, anterior interaxillary ansatamosis with extra time spend on tightness in right anterior chest, right upper arm and axilla    Passive ROM In Supine to Rt sholder into flexion, abuction, and ER/IR all to pts tolerance.  gentle stretching to pec major and minor by pression shoulder back toward mat, in left sidelying,scapular mobility                         [facility] Goals - 10/07/15 1155    CC Long Term Goal  #1   Title Patient with verbalize an understanding of lymphedema risk reduction precautions   Status On-going   CC Long Term Goal  #2   Title Patient will be independent in a home exercise program   Status On-going   CC Long Term Goal  #3   Title Patient will improve left shoulder abduction to 120 degrees so that she can achieve position needed for radiation therapy.   Status On-going   CC Long Term Goal  #4   Title Patient will improve right  shoulder abduction to 140 degrees so that she can return to previous  functional level    Status On-going   CC Long Term Goal  #5   Title Patient will decrease the DASH score to < 20    to demonstrate increased functional use of upper extremity   Status On-going            Plan - 10/19/15 1135    Clinical Impression Statement Pt is making slow but steady gains  Encouraged pt to focus on supine dowel stretch and standing shoulder extension stretch with cues to keep shoulder down and back. She continues to have anterior chest and anterior axillary tightness an   Pt will benefit from skilled therapeutic intervention in order to improve on the following deficits Increased fascial restricitons;Impaired UE functional use;Decreased knowledge of precautions;Decreased skin integrity;Pain;Decreased scar mobility;Decreased knowledge of use of DME;Decreased  strength;Increased edema;Postural dysfunction   Clinical Impairments Affecting Rehab Potential none   PT Next Visit Plan Remeasure AROM progress to [place] exercise with empahsis on posture.         Problem List Patient Active Problem List   Diagnosis Date Noted  . Gastric polyp 09/13/2015  . Breast cancer of upper-outer quadrant of right female breast ([place]) 08/26/2015  . Breast calcification, right 07/27/2015  . Inversion, nipple 07/27/2015  . Fatigue 07/22/2015  . Anterior scleritis of both eyes 05/12/2015  . Overweight (BMI 25.0-29.9) 05/12/2015  . Pre-diabetes 05/12/2015  . Essential hypertension, benign 09/05/2013  . Plantar fasciitis 09/05/2013  . Health care maintenance 03/24/2013  . Epidermal cyst 06/17/2012  . Sciatica 05/17/2012  . Hepatic steatosis 01/02/2012  . Sweating 12/12/2011  . Calculus of bile duct without mention of cholecystitis or obstruction 12/05/2011  . Mastitis 10/19/2010  . Hypercholesterolemia with hypertriglyceridemia 02/24/2009  . HYPERGLYCEMIA 02/24/2009  . Asymptomatic postmenopausal status 02/23/2009  . COMMON MIGRAINE 01/09/2007  . HEMORRHOIDS, NOS 09/13/2006    Donato Heinz. Owens Shark, PT   10/19/2015, 11:38 AM  [place], [place], [address] Phone: [phone]   Fax:  [phone]  Name: Nichole Wilson MRN: [id number] Date of Birth: 09-Oct-1960

## 2015-10-21 ENCOUNTER — Encounter: Payer: 59 | Admitting: Physical Therapy

## 2015-10-22 ENCOUNTER — Encounter: Payer: Self-pay | Admitting: Physical Therapy

## 2015-10-22 ENCOUNTER — Ambulatory Visit: Payer: 59 | Admitting: Physical Therapy

## 2015-10-22 DIAGNOSIS — R222 Localized swelling, mass and lump, trunk: Secondary | ICD-10-CM | POA: Diagnosis not present

## 2015-10-22 DIAGNOSIS — M25511 Pain in right shoulder: Secondary | ICD-10-CM | POA: Diagnosis not present

## 2015-10-22 DIAGNOSIS — M25611 Stiffness of right shoulder, not elsewhere classified: Secondary | ICD-10-CM

## 2015-10-22 NOTE — Therapy (Signed)
[place], [place], [address] Phone: [phone]   Fax:  [phone]  Physical Therapy Treatment  Patient Details  Name: Nichole Wilson MRN: [id number] Date of Birth: March 06, 1961 Referring Provider: Rolm Bookbinder   Encounter Date: 10/22/2015      PT End of Session - 10/22/15 1207    Visit Number 7   Number of Visits 9   Date for PT Re-Evaluation 11/01/15   PT Start Time 0940   PT Stop Time 1018   PT Time Calculation (min) 38 min   Activity Tolerance Patient tolerated treatment well   Behavior During Therapy [facility] for tasks assessed/performed      Past Medical History  Diagnosis Date  . Hypertriglyceridemia   . Hepatic steatosis   . Breast cyst, left 01-04-12    cyst no longer apparent  . Migraine without aura, without mention of intractable migraine without mention of status migrainosus   . Hypercholesterolemia with hypertriglyceridemia   . Cholelithiasis with choledocholithiasis   . Glucose intolerance (impaired glucose tolerance)     since at least 2011.   . Gallstone pancreatitis   . Hypertension 01-04-12    med d/c-last taken 12-07-11  -PT [place] STOPPED HER B/P MEDICATION BECAUSE HER B/P TOO LOW AND PT STATES HER B/P'S HAVE BEEN OK SINCE.  [name] Complication of anesthesia 2002    PT STATES DURING HER C-SECTION SHE WAS AWARE OF INCISION AND PAIN -THEN WAS QUICKLY "PUT OUT"  . Chronic calculus cholecystitis 12/25/2011    Lap chole on 03/25/12   . PONV (postoperative nausea and vomiting)   . Pre-diabetes   . Cancer [facility])     right breast cancer    Past Surgical History  Procedure Laterality Date  . Cesarean section      2002  . Ercp  12/05/2011    Procedure: ENDOSCOPIC RETROGRADE CHOLANGIOPANCREATOGRAPHY (ERCP);  Surgeon: Inda Castle, MD;  Location: [facility];  Service: Endoscopy;  Laterality: N/A;  . Ercp  12/08/2011    Procedure: ENDOSCOPIC RETROGRADE  CHOLANGIOPANCREATOGRAPHY (ERCP);  Surgeon: Inda Castle, MD;  Location: [place];  Service: Gastroenterology;  Laterality: N/A;  . Cholecystectomy  03/25/2012    Procedure: LAPAROSCOPIC CHOLECYSTECTOMY WITH INTRAOPERATIVE CHOLANGIOGRAM;  Surgeon: Haywood Lasso, MD;  [facility];  Service: General;  Laterality: N/A;  Laparoscopic Cholecystectomy with Intraoperative Cholangiogram  . Mastectomy w/ sentinel node biopsy Right 09/15/2015    Procedure: RIGHT MASTECTOMY WITH SENTINEL LYMPH NODE BIOPSY;  Surgeon: Rolm Bookbinder, MD;  Location: [place];  Service: General;  Laterality: Right;  . Esophagogastroduodenoscopy (egd) with propofol N/A 09/28/2015    Procedure: ESOPHAGOGASTRODUODENOSCOPY (EGD) WITH PROPOFOL;  Surgeon: Clarene Essex, MD;  Location: [facility] END[facility];  Service: Endoscopy;  Laterality: N/A;  H AND P IN DRAWER  . Eus N/A 10/13/2015    Procedure: UPPER ENDOSCOPIC ULTRASOUND (EUS) RADIAL;  Surgeon: Arta Silence, MD;  [facility];  Service: Endoscopy;  Laterality: N/A;    There were no vitals filed for this visit.      Subjective Assessment - 10/22/15 0942    Subjective I am doing pretty good today. My arm is getting better. I am trying to do more exercising. I am getting there and I am getting my strength back.    Pertinent History right breast cancer with mastetomy on 09/15/15 sentinel node biopsy. She has not chemotherapy or radiation and has not plans  for it    Patient Stated Goals to  be able to riase her hand all the way and to get stronger    Currently in Pain? Yes   Pain Score 2    Pain Location Axilla   Pain Orientation Right                         [facility] Adult PT Treatment/Exercise - 10/22/15 0001    Manual Therapy   Manual Lymphatic Drainage (MLD) In supine, short neck, superficial and deep abdominals, right inguinal nodes with right axillopinguinal anastamosis, anterior interaxillary ansatamosis with extra time spend on tightness in right  anterior chest, right upper arm and axilla    Passive ROM In Supine to Rt sholder into flexion, abuction, and ER/IR all to pts tolerance.  gentle stretching to pec major and minor by pression shoulder back toward mat, in left sidelying,scapular mobility with emphasis on protraction and moving arm rhythmically through flexion                        [facility] Goals - 10/22/15 0945    CC Long Term Goal  #1   Title Patient with verbalize an understanding of lymphedema risk reduction precautions   Baseline 10/22/15 - pt given sheet with risk reduction practices   Time 4   Period Weeks   Status On-going   CC Long Term Goal  #2   Title Patient will be independent in a home exercise program   Period Weeks   Status On-going   CC Long Term Goal  #3   Title Patient will improve left shoulder abduction to 120 degrees so that she can achieve position needed for radiation therapy.   Baseline eror   Status Deferred   CC Long Term Goal  #4   Title Patient will improve right  shoulder abduction to 140 degrees so that she can return to previous functional level    Baseline 10/22/15- pt is at 110 degrees   Status On-going   CC Long Term Goal  #5   Title Patient will decrease the DASH score to < 20    to demonstrate increased functional use of upper extremity   Status On-going            Plan - 10/22/15 1207    Clinical Impression Statement Pt is progressing towards goals in therapy. She reports relief with PROM especially in left sidelying with right shoulder into flexion while mobilizing scapula into protraction. Pt has significant tightness in right axilla.   Rehab Potential Excellent   Clinical Impairments Affecting Rehab Potential none   PT Frequency 2x / week   PT Duration 4 weeks   PT Treatment/Interventions ADLs/Self Care Home Management;Manual lymph drainage;Scar mobilization;Passive range of motion;Patient/family education;DME Instruction;Therapeutic  activities;Taping;Manual techniques;Therapeutic exercise   PT Next Visit Plan progress to [place] exercises- focus on posture, possible myofascial to right chest to decrease tightness   Consulted and Agree with Plan of Care Patient      Patient will benefit from skilled therapeutic intervention in order to improve the following deficits and impairments:  Increased fascial restricitons, Impaired UE functional use, Decreased knowledge of precautions, Decreased skin integrity, Pain, Decreased scar mobility, Decreased knowledge of use of DME, Decreased strength, Increased edema, Postural dysfunction  Visit Diagnosis: Shoulder stiffness, right  Pain in joint of right shoulder  Localized swelling, mass and lump, trunk     Problem List Patient Active Problem List   Diagnosis Date Noted  .  Gastric polyp 09/13/2015  . Breast cancer of upper-outer quadrant of right female breast ([place]) 08/26/2015  . Breast calcification, right 07/27/2015  . Inversion, nipple 07/27/2015  . Fatigue 07/22/2015  . Anterior scleritis of both eyes 05/12/2015  . Overweight (BMI 25.0-29.9) 05/12/2015  . Pre-diabetes 05/12/2015  . Essential hypertension, benign 09/05/2013  . Plantar fasciitis 09/05/2013  . Health care maintenance 03/24/2013  . Epidermal cyst 06/17/2012  . Sciatica 05/17/2012  . Hepatic steatosis 01/02/2012  . Sweating 12/12/2011  . Calculus of bile duct without mention of cholecystitis or obstruction 12/05/2011  . Mastitis 10/19/2010  . Hypercholesterolemia with hypertriglyceridemia 02/24/2009  . HYPERGLYCEMIA 02/24/2009  . Asymptomatic postmenopausal status 02/23/2009  . COMMON MIGRAINE 01/09/2007  . HEMORRHOIDS, NOS 09/13/2006    Alexia Freestone 10/22/2015, 12:12 PM  [place], [place], [address] Phone: [phone]   Fax:  [phone]  Name: NICOLLETTE DUGARD MRN: [id number] Date of Birth:  Feb 21, 1961    Allyson Sabal, PT 10/22/2015 12:12 PM

## 2015-10-25 ENCOUNTER — Encounter: Payer: Self-pay | Admitting: Physical Therapy

## 2015-10-25 ENCOUNTER — Ambulatory Visit: Payer: 59 | Admitting: Physical Therapy

## 2015-10-25 DIAGNOSIS — M25611 Stiffness of right shoulder, not elsewhere classified: Secondary | ICD-10-CM | POA: Diagnosis not present

## 2015-10-25 DIAGNOSIS — R222 Localized swelling, mass and lump, trunk: Secondary | ICD-10-CM | POA: Diagnosis not present

## 2015-10-25 DIAGNOSIS — M25511 Pain in right shoulder: Secondary | ICD-10-CM | POA: Diagnosis not present

## 2015-10-25 NOTE — Therapy (Deleted)
[place], [place], [address] Phone: [phone]   Fax:  [phone]  Physical Therapy Treatment  Patient Details  Name: Nichole Wilson MRN: [id number] Date of Birth: [birth year] Referring Provider: Rolm Bookbinder   Encounter Date: 10/25/2015      PT End of Session - 10/25/15 0940    Visit Number 8   Number of Visits 9   Date for PT Re-Evaluation 11/01/15   PT Start Time 0855   PT Stop Time 0941   PT Time Calculation (min) 46 min   Activity Tolerance Patient tolerated treatment well   Behavior During Therapy [facility] for tasks assessed/performed      Past Medical History  Diagnosis Date  . Hypertriglyceridemia   . Hepatic steatosis   . Breast cyst, left 01-04-12    cyst no longer apparent  . Migraine without aura, without mention of intractable migraine without mention of status migrainosus   . Hypercholesterolemia with hypertriglyceridemia   . Cholelithiasis with choledocholithiasis   . Glucose intolerance (impaired glucose tolerance)     since at least 2011.   . Gallstone pancreatitis   . Hypertension 01-04-12    med d/c-last taken 12-07-11  -PT [place] STOPPED HER B/P MEDICATION BECAUSE HER B/P TOO LOW AND PT STATES HER B/P'S HAVE BEEN OK SINCE.  [name] Complication of anesthesia 2002    PT STATES DURING HER C-SECTION SHE WAS AWARE OF INCISION AND PAIN -THEN WAS QUICKLY "PUT OUT"  . Chronic calculus cholecystitis 12/25/2011    Lap chole on 03/25/12   . PONV (postoperative nausea and vomiting)   . Pre-diabetes   . Cancer [facility])     right breast cancer    Past Surgical History  Procedure Laterality Date  . Cesarean section      2002  . Ercp  12/05/2011    Procedure: ENDOSCOPIC RETROGRADE CHOLANGIOPANCREATOGRAPHY (ERCP);  Surgeon: Inda Castle, MD;  Location: [facility];  Service: Endoscopy;  Laterality: N/A;  . Ercp  12/08/2011    Procedure: ENDOSCOPIC RETROGRADE  CHOLANGIOPANCREATOGRAPHY (ERCP);  Surgeon: Inda Castle, MD;  Location: [place];  Service: Gastroenterology;  Laterality: N/A;  . Cholecystectomy  03/25/2012    Procedure: LAPAROSCOPIC CHOLECYSTECTOMY WITH INTRAOPERATIVE CHOLANGIOGRAM;  Surgeon: Haywood Lasso, MD;  Location: [facility];  Service: General;  Laterality: N/A;  Laparoscopic Cholecystectomy with Intraoperative Cholangiogram  . Mastectomy w/ sentinel node biopsy Right 09/15/2015    Procedure: RIGHT MASTECTOMY WITH SENTINEL LYMPH NODE BIOPSY;  Surgeon: Rolm Bookbinder, MD;  Location: [place];  Service: General;  Laterality: Right;  . Esophagogastroduodenoscopy (egd) with propofol N/A 09/28/2015    Procedure: ESOPHAGOGASTRODUODENOSCOPY (EGD) WITH PROPOFOL;  Surgeon: Clarene Essex, MD;  Location: [facility];  Service: Endoscopy;  Laterality: N/A;  H AND P IN DRAWER  . Eus N/A 10/13/2015    Procedure: UPPER ENDOSCOPIC ULTRASOUND (EUS) RADIAL;  Surgeon: Arta Silence, MD;  Location: [facility];  Service: Endoscopy;  Laterality: N/A;    There were no vitals filed for this visit.      Subjective Assessment - 10/25/15 0857    Subjective Pt is returning to work today. 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    Pertinent History right breast cancer with mastetomy on 09/15/15 sentinel node biopsy. She has not chemotherapy or radiation and has not plans  for it    Patient Stated Goals to be able to riase her hand all the way and  to get stronger    Currently in Pain? Yes   Pain Score 1    Pain Location Axilla   Pain Orientation Right   Pain Descriptors / Indicators Dull                         [facility] Adult PT Treatment/Exercise - 10/25/15 0001    Shoulder Exercises: Sidelying   External Rotation Strengthening;10 reps;Theraband  yellow   Internal Rotation Strengthening;10 reps;Theraband  yellow   Flexion Strengthening;10 reps;Theraband  yellow   ABduction Strengthening;10 reps;Theraband   yellow   Manual Therapy   Manual Therapy Myofascial release   Myofascial Release performed to right chest superior to mastectomy scar to decrease tightness and allow improved ROM   Passive ROM In Supine to Rt sholder into flexion, abuction, and ER/IR all to pts tolerance, in left sidelying,scapular mobility with emphasis on protraction and moving arm rhythmically through flexion                PT Education - 10/25/15 0940    Education provided Yes   Education Details rockwood shoulder exercises   Person(s) Educated Patient   Methods Explanation;Demonstration;Handout   Comprehension Verbalized understanding;Returned demonstration                [facility] Goals - 10/22/15 0945    CC Long Term Goal  #1   Title Patient with verbalize an understanding of lymphedema risk reduction precautions   Baseline 10/22/15 - pt given sheet with risk reduction practices   Time 4   Period Weeks   Status On-going   CC Long Term Goal  #2   Title Patient will be independent in a home exercise program   Period Weeks   Status On-going   CC Long Term Goal  #3   Title Patient will improve left shoulder abduction to 120 degrees so that she can achieve position needed for radiation therapy.   Baseline eror   Status Deferred   CC Long Term Goal  #4   Title Patient will improve right  shoulder abduction to 140 degrees so that she can return to previous functional level    Baseline 10/22/15- pt is at 110 degrees   Status On-going   CC Long Term Goal  #5   Title Patient will decrease the DASH score to < 20    to demonstrate increased functional use of upper extremity   Status On-going            Plan - 10/25/15 0941    Clinical Impression Statement Decreased tightness felt in right chest following myofascial release. Increased motion noted during PROM following myofascial release. Pt was instructed in new strengthening exercises (Rockwood) with yellow theraband attached to door  knob.    Rehab Potential Excellent   Clinical Impairments Affecting Rehab Potential none   PT Frequency 2x / week   PT Duration 4 weeks   PT Treatment/Interventions ADLs/Self Care Home Management;Manual lymph drainage;Scar mobilization;Passive range of motion;Patient/family education;DME Instruction;Therapeutic activities;Taping;Manual techniques;Therapeutic exercise   PT Next Visit Plan assess indep with rockwood exercises, myofascial to right chest, posture exercises   Consulted and Agree with Plan of Care Patient      Patient will benefit from skilled therapeutic intervention in order to improve the following deficits and impairments:  Increased fascial restricitons, Impaired UE functional use, Decreased knowledge of precautions, Decreased skin integrity, Pain, Decreased scar mobility, Decreased knowledge of use of DME, Decreased strength, Increased edema,  Postural dysfunction  Visit Diagnosis: Shoulder stiffness, right  Pain in joint of right shoulder     Problem List Patient Active Problem List   Diagnosis Date Noted  . Gastric polyp 09/13/2015  . Breast cancer of upper-outer quadrant of right female breast ([place]) 08/26/2015  . Breast calcification, right 07/27/2015  . Inversion, nipple 07/27/2015  . Fatigue 07/22/2015  . Anterior scleritis of both eyes 05/12/2015  . Overweight (BMI 25.0-29.9) 05/12/2015  . Pre-diabetes 05/12/2015  . Essential hypertension, benign 09/05/2013  . Plantar fasciitis 09/05/2013  . Health care maintenance 03/24/2013  . Epidermal cyst 06/17/2012  . Sciatica 05/17/2012  . Hepatic steatosis 01/02/2012  . Sweating 12/12/2011  . Calculus of bile duct without mention of cholecystitis or obstruction 12/05/2011  . Mastitis 10/19/2010  . Hypercholesterolemia with hypertriglyceridemia 02/24/2009  . HYPERGLYCEMIA 02/24/2009  . Asymptomatic postmenopausal status 02/23/2009  . COMMON MIGRAINE 01/09/2007  . HEMORRHOIDS, NOS 09/13/2006    Alexia Freestone 10/25/2015, 9:48 AM  [place], [place], [address] Phone: [phone]   Fax:  [phone]  Name: PERIANN BARREDO MRN: [id number] Date of Birth: [birth year]    Allyson Sabal, PT 10/25/2015 9:48 AM

## 2015-10-25 NOTE — Patient Instructions (Signed)
Strengthening: Resisted Internal Rotation   Hold tubing in right hand, elbow at side and forearm out. Rotate forearm in across body. Repeat _10___ times per set. Do __1__ sets per session. Do __2__ sessions per day.  http://orth.exer.us/830   Copyright  VHI. All rights reserved.  Strengthening: Resisted External Rotation   Hold tubing in right hand, elbow at side and forearm across body. Rotate forearm out. Repeat __10__ times per set. Do _1___ sets per session. Do _2___ sessions per day.  http://orth.exer.us/828   Copyright  VHI. All rights reserved.  Strengthening: Resisted Flexion   Hold tubing with right arm at side. Pull forward and up. Move shoulder through pain-free range of motion. Repeat _10___ times per set. Do _1___ sets per session. Do _2___ sessions per day.  http://orth.exer.us/824   Copyright  VHI. All rights reserved.  Strengthening: Resisted Extension   Hold tubing in right hand, arm forward. Pull arm back, elbow straight. Repeat 10____ times per set. Do __1__ sets per session. Do _2___ sessions per day.  http://orth.exer.us/832   Copyright  VHI. All rights reserved.

## 2015-10-27 MED FILL — metFORMIN HCL 500 MG TABS: 500 | 30 days supply | Qty: 60 | Fill #1

## 2015-10-28 ENCOUNTER — Ambulatory Visit: Payer: 59 | Admitting: Physical Therapy

## 2015-10-28 ENCOUNTER — Encounter: Payer: Self-pay | Admitting: Physical Therapy

## 2015-10-28 ENCOUNTER — Encounter: Payer: 59 | Admitting: Physical Therapy

## 2015-10-28 DIAGNOSIS — R222 Localized swelling, mass and lump, trunk: Secondary | ICD-10-CM | POA: Diagnosis not present

## 2015-10-28 DIAGNOSIS — M25611 Stiffness of right shoulder, not elsewhere classified: Secondary | ICD-10-CM

## 2015-10-28 DIAGNOSIS — M25511 Pain in right shoulder: Secondary | ICD-10-CM

## 2015-10-28 NOTE — Therapy (Signed)
[place], [place], [address] Phone: [phone]   Fax:  [phone]  Physical Therapy Treatment  Patient Details  Name: Nichole Wilson MRN: [id number] Date of Birth: 19-Feb-1961 Referring Provider: Rolm Bookbinder   Encounter Date: 10/25/2015      PT End of Session - 10/28/15 0903    Visit Number 9   Number of Visits 17   Date for PT Re-Evaluation 12/04/15   Authorization Type April Cert Completed   PT Start Time 0810   PT Stop Time 0855   PT Time Calculation (min) 45 min   Activity Tolerance Patient tolerated treatment well   Behavior During Therapy [facility] for tasks assessed/performed      Past Medical History  Diagnosis Date  . Hypertriglyceridemia   . Hepatic steatosis   . Breast cyst, left 01-04-12    cyst no longer apparent  . Migraine without aura, without mention of intractable migraine without mention of status migrainosus   . Hypercholesterolemia with hypertriglyceridemia   . Cholelithiasis with choledocholithiasis   . Glucose intolerance (impaired glucose tolerance)     since at least 2011.   . Gallstone pancreatitis   . Hypertension 01-04-12    med d/c-last taken 12-07-11  -PT [place] STOPPED HER B/P MEDICATION BECAUSE HER B/P TOO LOW AND PT STATES HER B/P'S HAVE BEEN OK SINCE.  [name] Complication of anesthesia 2002    PT STATES DURING HER C-SECTION SHE WAS AWARE OF INCISION AND PAIN -THEN WAS QUICKLY "PUT OUT"  . Chronic calculus cholecystitis 12/25/2011    Lap chole on 03/25/12   . PONV (postoperative nausea and vomiting)   . Pre-diabetes   . Cancer [facility])     right breast cancer    Past Surgical History  Procedure Laterality Date  . Cesarean section      2002  . Ercp  12/05/2011    Procedure: ENDOSCOPIC RETROGRADE CHOLANGIOPANCREATOGRAPHY (ERCP);  Surgeon: Inda Castle, MD;  Location: [facility];  Service: Endoscopy;  Laterality: N/A;  . Ercp  12/08/2011     Procedure: ENDOSCOPIC RETROGRADE CHOLANGIOPANCREATOGRAPHY (ERCP);  Surgeon: Inda Castle, MD;  Location: [place];  Service: Gastroenterology;  Laterality: N/A;  . Cholecystectomy  03/25/2012    Procedure: LAPAROSCOPIC CHOLECYSTECTOMY WITH INTRAOPERATIVE CHOLANGIOGRAM;  Surgeon: Haywood Lasso, MD;  [facility];  Service: General;  Laterality: N/A;  Laparoscopic Cholecystectomy with Intraoperative Cholangiogram  . Mastectomy w/ sentinel node biopsy Right 09/15/2015    Procedure: RIGHT MASTECTOMY WITH SENTINEL LYMPH NODE BIOPSY;  Surgeon: Rolm Bookbinder, MD;  Location: [place];  Service: General;  Laterality: Right;  . Esophagogastroduodenoscopy (egd) with propofol N/A 09/28/2015    Procedure: ESOPHAGOGASTRODUODENOSCOPY (EGD) WITH PROPOFOL;  Surgeon: Clarene Essex, MD;  Location[facility]COPY;  Service: Endoscopy;  Laterality: N/A;  H AND P IN DRAWER  . Eus N/A 10/13/2015    Procedure: UPPER ENDOSCOPIC ULTRASOUND (EUS) RADIAL;  Surgeon: Arta Silence, MD;  [facility];  Service: Endoscopy;  Laterality: N/A;    There were no vitals filed for this visit.      Subjective Assessment - 10/28/15 0814    Subjective Pt states she was able to put reach behind her head to put her earring on this morning which she wasn't able to do before.    Pertinent History right breast cancer with mastetomy on 09/15/15 sentinel node biopsy. She has not chemotherapy or radiation and has not plans  for it    Patient  Stated Goals to be able to riase her hand all the way and to get stronger    Currently in Pain? Yes   Pain Score 1    Pain Location Chest   Pain Orientation Right   Pain Descriptors / Indicators Pins and needles   Pain Type Surgical pain   Pain Onset 1 to 4 weeks ago   Pain Frequency Intermittent                         [facility] Adult PT Treatment/Exercise - 10/28/15 0001    Shoulder Exercises: Standing   External Rotation Strengthening;10 reps;Theraband  yellow   Internal  Rotation Strengthening;10 reps;Theraband;Right  yellow   Flexion Strengthening;10 reps;Theraband;Right  yellow   Extension Strengthening;10 reps;Theraband;Right  yellow   Row Strengthening;10 reps;Theraband;Both  yellow   Shoulder Exercises: Pulleys   Flexion 2 minutes   ABduction 2 minutes   Shoulder Exercises: Therapy Ball   Flexion 10 reps   ABduction 10 reps   Manual Therapy   Manual Therapy Myofascial release   Myofascial Release performed to right chest superior to mastectomy scar to decrease tightness and allow improved ROM   Passive ROM In Supine to Rt sholder into flexion, abuction, and ER/IR all to pts tolerance, in left sidelying,scapular mobility with emphasis on protraction and moving arm rhythmically through flexion                PT Education - 10/28/15 0827    Education provided Yes   Education Details proper posture with engaged core to decrease lumbar lordosis   Person(s) Educated Patient   Methods Explanation;Demonstration;Tactile cues;Verbal cues   Comprehension Verbalized understanding;Returned demonstration;Verbal cues required                [facility] Goals - 10/28/15 0909    CC Long Term Goal  #1   Title Patient with verbalize an understanding of lymphedema risk reduction precautions   Baseline 10/22/15 - pt given sheet with risk reduction practices   Time 4   Period Weeks   Status On-going   CC Long Term Goal  #2   Title Patient will be independent in a home exercise program   Time 4   Period Weeks   Status On-going   CC Long Term Goal  #4   Title Patient will improve right  shoulder abduction to 140 degrees so that she can return to previous functional level    Baseline 10/22/15- pt is at 110 degrees   Time 4   Period Weeks   Status On-going   CC Long Term Goal  #5   Title Patient will decrease the DASH score to < 20    to demonstrate increased functional use of upper extremity   Time 4   Period Weeks   Status On-going             Plan - 10/28/15 0905    Clinical Impression Statement Pt is demonstrating improving right shoulder ROM. She is demonstrating increasing independence with her home exercise program but is still requiring minimal cues. Tightness above right mastectomy scar is somewhat relieved following myofascial release. Pt would benefit from continued skilled PT services to further increase ROM and strength of RUE to allow pt to return to prior level of function.    Rehab Potential Excellent   Clinical Impairments Affecting Rehab Potential none   PT Frequency 2x / week   PT Duration 4 weeks   PT  Treatment/Interventions ADLs/Self Care Home Management;Manual lymph drainage;Scar mobilization;Passive range of motion;Patient/family education;DME Instruction;Therapeutic activities;Taping;Manual techniques;Therapeutic exercise   PT Next Visit Plan assess goals, add new ROM goal and strength goal, continue PROM/AAROM/AROM and strengthening exercises, add to HEP as needed   PT Home Exercise Plan rockwood exercises, supine scapular exercises   Consulted and Agree with Plan of Care Patient      Patient will benefit from skilled therapeutic intervention in order to improve the following deficits and impairments:  Increased fascial restricitons, Impaired UE functional use, Decreased knowledge of precautions, Decreased skin integrity, Pain, Decreased scar mobility, Decreased knowledge of use of DME, Decreased strength, Increased edema, Postural dysfunction  Visit Diagnosis: Shoulder stiffness, right  Pain in joint of right shoulder     Problem List Patient Active Problem List   Diagnosis Date Noted  . Gastric polyp 09/13/2015  . Breast cancer of upper-outer quadrant of right female breast ([place]) 08/26/2015  . Breast calcification, right 07/27/2015  . Inversion, nipple 07/27/2015  . Fatigue 07/22/2015  . Anterior scleritis of both eyes 05/12/2015  . Overweight (BMI 25.0-29.9) 05/12/2015  .  Pre-diabetes 05/12/2015  . Essential hypertension, benign 09/05/2013  . Plantar fasciitis 09/05/2013  . Health care maintenance 03/24/2013  . Epidermal cyst 06/17/2012  . Sciatica 05/17/2012  . Hepatic steatosis 01/02/2012  . Sweating 12/12/2011  . Calculus of bile duct without mention of cholecystitis or obstruction 12/05/2011  . Mastitis 10/19/2010  . Hypercholesterolemia with hypertriglyceridemia 02/24/2009  . HYPERGLYCEMIA 02/24/2009  . Asymptomatic postmenopausal status 02/23/2009  . COMMON MIGRAINE 01/09/2007  . HEMORRHOIDS, NOS 09/13/2006    Alexia Freestone 10/28/2015, 10:36 AM  [place], [place], [address] Phone: [phone]   Fax:  [phone]  Name: Nichole Wilson MRN: [id number] Date of Birth: 1960-10-19    Allyson Sabal, PT 10/28/2015 10:36 AM

## 2015-10-28 NOTE — Therapy (Signed)
[place], [place], [address] Phone: [phone]   Fax:  [phone]  Physical Therapy Treatment  Patient Details  Name: Nichole Wilson MRN: [id number] Date of Birth: [birth year] Referring Provider: Rolm Bookbinder   Encounter Date: 10/28/2015      PT End of Session - 10/28/15 0903    Visit Number 9   Number of Visits 17   Date for PT Re-Evaluation 12/04/15   Authorization Type April Cert Completed   PT Start Time 0810   PT Stop Time 0855   PT Time Calculation (min) 45 min   Activity Tolerance Patient tolerated treatment well   Behavior During Therapy [facility] for tasks assessed/performed      Past Medical History  Diagnosis Date  . Hypertriglyceridemia   . Hepatic steatosis   . Breast cyst, left 01-04-12    cyst no longer apparent  . Migraine without aura, without mention of intractable migraine without mention of status migrainosus   . Hypercholesterolemia with hypertriglyceridemia   . Cholelithiasis with choledocholithiasis   . Glucose intolerance (impaired glucose tolerance)     since at least 2011.   . Gallstone pancreatitis   . Hypertension 01-04-12    med d/c-last taken 12-07-11  -PT [place] STOPPED HER B/P MEDICATION BECAUSE HER B/P TOO LOW AND PT STATES HER B/P'S HAVE BEEN OK SINCE.  [name] Complication of anesthesia 2002    PT STATES DURING HER C-SECTION SHE WAS AWARE OF INCISION AND PAIN -THEN WAS QUICKLY "PUT OUT"  . Chronic calculus cholecystitis 12/25/2011    Lap chole on 03/25/12   . PONV (postoperative nausea and vomiting)   . Pre-diabetes   . Cancer [facility])     right breast cancer    Past Surgical History  Procedure Laterality Date  . Cesarean section      2002  . Ercp  12/05/2011    Procedure: ENDOSCOPIC RETROGRADE CHOLANGIOPANCREATOGRAPHY (ERCP);  Surgeon: Inda Castle, MD;  Location: [facility];  Service: Endoscopy;  Laterality: N/A;  . Ercp  12/08/2011     Procedure: ENDOSCOPIC RETROGRADE CHOLANGIOPANCREATOGRAPHY (ERCP);  Surgeon: Inda Castle, MD;  Location: [place];  Service: Gastroenterology;  Laterality: N/A;  . Cholecystectomy  03/25/2012    Procedure: LAPAROSCOPIC CHOLECYSTECTOMY WITH INTRAOPERATIVE CHOLANGIOGRAM;  Surgeon: Haywood Lasso, MD;  Location: [facility];  Service: General;  Laterality: N/A;  Laparoscopic Cholecystectomy with Intraoperative Cholangiogram  . Mastectomy w/ sentinel node biopsy Right 09/15/2015    Procedure: RIGHT MASTECTOMY WITH SENTINEL LYMPH NODE BIOPSY;  Surgeon: Rolm Bookbinder, MD;  Location: [place];  Service: General;  Laterality: Right;  . Esophagogastroduodenoscopy (egd) with propofol N/A 09/28/2015    Procedure: ESOPHAGOGASTRODUODENOSCOPY (EGD) WITH PROPOFOL;  Surgeon: Clarene Essex, MD;  Location: [facility];  Service: Endoscopy;  Laterality: N/A;  H AND P IN DRAWER  . Eus N/A 10/13/2015    Procedure: UPPER ENDOSCOPIC ULTRASOUND (EUS) RADIAL;  Surgeon: Arta Silence, MD;  Location: [facility];  Service: Endoscopy;  Laterality: N/A;    There were no vitals filed for this visit.      Subjective Assessment - 10/28/15 0814    Subjective Pt states she was able to put reach behind her head to put her earring on this morning which she wasn't able to do before.    Pertinent History right breast cancer with mastetomy on 09/15/15 sentinel node biopsy. She has not chemotherapy or radiation and has not plans  for it    Patient  Stated Goals to be able to riase her hand all the way and to get stronger    Currently in Pain? Yes   Pain Score 1    Pain Location Chest   Pain Orientation Right   Pain Descriptors / Indicators Pins and needles   Pain Type Surgical pain   Pain Onset 1 to 4 weeks ago   Pain Frequency Intermittent                         [facility] Adult PT Treatment/Exercise - 10/28/15 0001    Shoulder Exercises: Standing   External Rotation Strengthening;10 reps;Theraband  yellow   Internal  Rotation Strengthening;10 reps;Theraband;Right  yellow   Flexion Strengthening;10 reps;Theraband;Right  yellow   Extension Strengthening;10 reps;Theraband;Right  yellow   Row Strengthening;10 reps;Theraband;Both  yellow   Shoulder Exercises: Pulleys   Flexion 2 minutes   ABduction 2 minutes   Shoulder Exercises: Therapy Ball   Flexion 10 reps   ABduction 10 reps   Manual Therapy   Manual Therapy Myofascial release   Myofascial Release performed to right chest superior to mastectomy scar to decrease tightness and allow improved ROM   Passive ROM In Supine to Rt sholder into flexion, abuction, and ER/IR all to pts tolerance, in left sidelying,scapular mobility with emphasis on protraction and moving arm rhythmically through flexion                PT Education - 10/28/15 0827    Education provided Yes   Education Details proper posture with engaged core to decrease lumbar lordosis   Person(s) Educated Patient   Methods Explanation;Demonstration;Tactile cues;Verbal cues   Comprehension Verbalized understanding;Returned demonstration;Verbal cues required                [facility] Goals - 10/28/15 0909    CC Long Term Goal  #1   Title Patient with verbalize an understanding of lymphedema risk reduction precautions   Baseline 10/22/15 - pt given sheet with risk reduction practices   Time 4   Period Weeks   Status On-going   CC Long Term Goal  #2   Title Patient will be independent in a home exercise program   Time 4   Period Weeks   Status On-going   CC Long Term Goal  #4   Title Patient will improve right  shoulder abduction to 140 degrees so that she can return to previous functional level    Baseline 10/22/15- pt is at 110 degrees   Time 4   Period Weeks   Status On-going   CC Long Term Goal  #5   Title Patient will decrease the DASH score to < 20    to demonstrate increased functional use of upper extremity   Time 4   Period Weeks   Status On-going             Plan - 10/28/15 0905    Clinical Impression Statement Pt is demonstrating improving right shoulder ROM. She is demonstrating increasing independence with her home exercise program but is still requiring minimal cues. Tightness above right mastectomy scar is somewhat relieved following myofascial release. Pt would benefit from continued skilled PT services to further increase ROM and strength of RUE to allow pt to return to prior level of function.    Rehab Potential Excellent   Clinical Impairments Affecting Rehab Potential none   PT Frequency 2x / week   PT Duration 4 weeks   PT  Treatment/Interventions ADLs/Self Care Home Management;Manual lymph drainage;Scar mobilization;Passive range of motion;Patient/family education;DME Instruction;Therapeutic activities;Taping;Manual techniques;Therapeutic exercise   PT Next Visit Plan assess goals, add new ROM goal and strength goal, continue PROM/AAROM/AROM and strengthening exercises, add to HEP as needed   PT Home Exercise Plan rockwood exercises, supine scapular exercises   Consulted and Agree with Plan of Care Patient      Patient will benefit from skilled therapeutic intervention in order to improve the following deficits and impairments:  Increased fascial restricitons, Impaired UE functional use, Decreased knowledge of precautions, Decreased skin integrity, Pain, Decreased scar mobility, Decreased knowledge of use of DME, Decreased strength, Increased edema, Postural dysfunction  Visit Diagnosis: Stiffness of right shoulder, not elsewhere classified - Plan: PT plan of care cert/re-cert  Pain in joint of right shoulder - Plan: PT plan of care cert/re-cert  Localized swelling, mass and lump, trunk - Plan: PT plan of care cert/re-cert     Problem List Patient Active Problem List   Diagnosis Date Noted  . Gastric polyp 09/13/2015  . Breast cancer of upper-outer quadrant of right female breast ([place]) 08/26/2015  . Breast  calcification, right 07/27/2015  . Inversion, nipple 07/27/2015  . Fatigue 07/22/2015  . Anterior scleritis of both eyes 05/12/2015  . Overweight (BMI 25.0-29.9) 05/12/2015  . Pre-diabetes 05/12/2015  . Essential hypertension, benign 09/05/2013  . Plantar fasciitis 09/05/2013  . Health care maintenance 03/24/2013  . Epidermal cyst 06/17/2012  . Sciatica 05/17/2012  . Hepatic steatosis 01/02/2012  . Sweating 12/12/2011  . Calculus of bile duct without mention of cholecystitis or obstruction 12/05/2011  . Mastitis 10/19/2010  . Hypercholesterolemia with hypertriglyceridemia 02/24/2009  . HYPERGLYCEMIA 02/24/2009  . Asymptomatic postmenopausal status 02/23/2009  . COMMON MIGRAINE 01/09/2007  . HEMORRHOIDS, NOS 09/13/2006    Alexia Freestone 10/28/2015, 9:17 AM  [place], [place], [address] Phone: [phone]   Fax:  [phone]  Name: Nichole Wilson MRN: [id number] Date of Birth: [birth year]    Allyson Sabal, PT 10/28/2015 9:17 AM

## 2015-11-01 ENCOUNTER — Ambulatory Visit: Payer: 59 | Admitting: Physical Therapy

## 2015-11-01 ENCOUNTER — Encounter: Payer: Self-pay | Admitting: Physical Therapy

## 2015-11-01 ENCOUNTER — Encounter: Payer: Self-pay | Admitting: Hematology and Oncology

## 2015-11-01 DIAGNOSIS — M25611 Stiffness of right shoulder, not elsewhere classified: Secondary | ICD-10-CM | POA: Diagnosis not present

## 2015-11-01 DIAGNOSIS — R222 Localized swelling, mass and lump, trunk: Secondary | ICD-10-CM

## 2015-11-01 DIAGNOSIS — M25511 Pain in right shoulder: Secondary | ICD-10-CM

## 2015-11-01 NOTE — Progress Notes (Signed)
Sent notes to [employer] for std  [address]

## 2015-11-01 NOTE — Therapy (Signed)
[place], [place], [address] Phone: [phone]   Fax:  [phone]  Physical Therapy Treatment  Patient Details  Name: Nichole Wilson MRN: [id number] Date of Birth: [birth year] Referring Provider: Rolm Bookbinder   Encounter Date: 11/01/2015      PT End of Session - 11/01/15 0850    Visit Number 10   Number of Visits 17   Date for PT Re-Evaluation 12/04/15   Authorization Type April Cert Completed   Authorization Time Period pt arrived late   PT Start Time 0810   PT Stop Time 0845   PT Time Calculation (min) 35 min   Activity Tolerance Patient tolerated treatment well   Behavior During Therapy [facility] for tasks assessed/performed      Past Medical History  Diagnosis Date  . Hypertriglyceridemia   . Hepatic steatosis   . Breast cyst, left 01-04-12    cyst no longer apparent  . Migraine without aura, without mention of intractable migraine without mention of status migrainosus   . Hypercholesterolemia with hypertriglyceridemia   . Cholelithiasis with choledocholithiasis   . Glucose intolerance (impaired glucose tolerance)     since at least 2011.   . Gallstone pancreatitis   . Hypertension 01-04-12    med d/c-last taken 12-07-11  -PT [place] STOPPED HER B/P MEDICATION BECAUSE HER B/P TOO LOW AND PT STATES HER B/P'S HAVE BEEN OK SINCE.  [name] Complication of anesthesia 2002    PT STATES DURING HER C-SECTION SHE WAS AWARE OF INCISION AND PAIN -THEN WAS QUICKLY "PUT OUT"  . Chronic calculus cholecystitis 12/25/2011    Lap chole on 03/25/12   . PONV (postoperative nausea and vomiting)   . Pre-diabetes   . Cancer [facility])     right breast cancer    Past Surgical History  Procedure Laterality Date  . Cesarean section      2002  . Ercp  12/05/2011    Procedure: ENDOSCOPIC RETROGRADE CHOLANGIOPANCREATOGRAPHY (ERCP);  Surgeon: Inda Castle, MD;  Location: [facility];  Service:  Endoscopy;  Laterality: N/A;  . Ercp  12/08/2011    Procedure: ENDOSCOPIC RETROGRADE CHOLANGIOPANCREATOGRAPHY (ERCP);  Surgeon: Inda Castle, MD;  Location: [place];  Service: Gastroenterology;  Laterality: N/A;  . Cholecystectomy  03/25/2012    Procedure: LAPAROSCOPIC CHOLECYSTECTOMY WITH INTRAOPERATIVE CHOLANGIOGRAM;  Surgeon: Haywood Lasso, MD;  [facility];  Service: General;  Laterality: N/A;  Laparoscopic Cholecystectomy with Intraoperative Cholangiogram  . Mastectomy w/ sentinel node biopsy Right 09/15/2015    Procedure: RIGHT MASTECTOMY WITH SENTINEL LYMPH NODE BIOPSY;  Surgeon: Rolm Bookbinder, MD;  Location: [place];  Service: General;  Laterality: Right;  . Esophagogastroduodenoscopy (egd) with propofol N/A 09/28/2015    Procedure: ESOPHAGOGASTRODUODENOSCOPY (EGD) WITH PROPOFOL;  Surgeon: Clarene Essex, MD;  Location: [facility];  Service: Endoscopy;  Laterality: N/A;  H AND P IN DRAWER  . Eus N/A 10/13/2015    Procedure: UPPER ENDOSCOPIC ULTRASOUND (EUS) RADIAL;  Surgeon: Arta Silence, MD;  [facility];  Service: Endoscopy;  Laterality: N/A;    There were no vitals filed for this visit.      Subjective Assessment - 11/01/15 0849    Subjective I had a bug over the weekend I think that is why I was feeling tired last week   Pertinent History right breast cancer with mastetomy on 09/15/15 sentinel node biopsy. She has not chemotherapy or radiation and has not plans  for it    Patient  Stated Goals to be able to riase her hand all the way and to get stronger    Currently in Pain? No/denies   Pain Score 0-No pain                         [facility] Adult PT Treatment/Exercise - 11/01/15 0001    Shoulder Exercises: Standing   External Rotation Strengthening;10 reps;Theraband  yellow   Internal Rotation Strengthening;10 reps;Theraband;Right  yellow, with cues not to rotate body   Flexion Strengthening;10 reps;Theraband;Right  yellow   ABduction  Strengthening;10 reps;Theraband  yellow   Extension Strengthening;10 reps;Theraband;Right  yellow   Row Strengthening;10 reps;Theraband;Both  yellow   Other Standing Exercises strengthening, theraband adduction yellow x 10   Shoulder Exercises: Pulleys   Flexion 2 minutes   ABduction 2 minutes   Shoulder Exercises: Therapy Ball   Flexion 10 reps   ABduction 10 reps   Manual Therapy   Passive ROM In Supine to Rt sholder into flexion, abuction, and ER/IR all to pts tolerance, in left sidelying,scapular mobility with emphasis on protraction and moving arm rhythmically through flexion                        [facility] Goals - 11/01/15 0816    CC Long Term Goal  #1   Baseline 10/22/15 - pt given sheet with risk reduction practices   Time 4   Period Weeks   Status On-going   CC Long Term Goal  #2   Title Patient will be independent in a home exercise program   Time 4   Period Weeks   Status On-going   CC Long Term Goal  #4   Title Patient will improve right  shoulder abduction to 140 degrees so that she can return to previous functional level    Baseline 10/22/15- pt is at 110 degrees, 11/01/15- 134 degrees   Time 4   Period Weeks   Status On-going   CC Long Term Goal  #5   Title Patient will decrease the DASH score to < 20    to demonstrate increased functional use of upper extremity   Time 4   Period Weeks   Status On-going            Plan - 11/01/15 0851    Clinical Impression Statement Pt is progressing towards goals in therapy. Her ROM is improving and pt is becoming more independent in her home exercise program. She does still require minimal cues to perform exercises correctly and to avoid rotating her body during theraband exercises.    Clinical Impairments Affecting Rehab Potential none   PT Frequency 2x / week   PT Duration 4 weeks   PT Treatment/Interventions ADLs/Self Care Home Management;Manual lymph drainage;Scar mobilization;Passive range  of motion;Patient/family education;DME Instruction;Therapeutic activities;Taping;Manual techniques;Therapeutic exercise   PT Next Visit Plan add new ROM and strength goals as pt meets goals, PROM/AAROM/AROM and strengthening   Consulted and Agree with Plan of Care Patient      Patient will benefit from skilled therapeutic intervention in order to improve the following deficits and impairments:  Increased fascial restricitons, Impaired UE functional use, Decreased knowledge of precautions, Decreased skin integrity, Pain, Decreased scar mobility, Decreased knowledge of use of DME, Decreased strength, Increased edema, Postural dysfunction  Visit Diagnosis: Stiffness of right shoulder, not elsewhere classified  Pain in joint of right shoulder  Localized swelling, mass and lump, trunk  Problem List Patient Active Problem List   Diagnosis Date Noted  . Gastric polyp 09/13/2015  . Breast cancer of upper-outer quadrant of right female breast ([place]) 08/26/2015  . Breast calcification, right 07/27/2015  . Inversion, nipple 07/27/2015  . Fatigue 07/22/2015  . Anterior scleritis of both eyes 05/12/2015  . Overweight (BMI 25.0-29.9) 05/12/2015  . Pre-diabetes 05/12/2015  . Essential hypertension, benign 09/05/2013  . Plantar fasciitis 09/05/2013  . Health care maintenance 03/24/2013  . Epidermal cyst 06/17/2012  . Sciatica 05/17/2012  . Hepatic steatosis 01/02/2012  . Sweating 12/12/2011  . Calculus of bile duct without mention of cholecystitis or obstruction 12/05/2011  . Mastitis 10/19/2010  . Hypercholesterolemia with hypertriglyceridemia 02/24/2009  . HYPERGLYCEMIA 02/24/2009  . Asymptomatic postmenopausal status 02/23/2009  . COMMON MIGRAINE 01/09/2007  . HEMORRHOIDS, NOS 09/13/2006    Alexia Freestone 11/01/2015, 8:57 AM  [place], [place], [address] Phone: [phone]   Fax:   [phone]  Name: Nichole Wilson MRN: [id number] Date of Birth: 09/06/1960    Allyson Sabal, PT 11/01/2015 8:57 AM

## 2015-11-02 ENCOUNTER — Ambulatory Visit (HOSPITAL_BASED_OUTPATIENT_CLINIC_OR_DEPARTMENT_OTHER): Payer: 59 | Admitting: Hematology and Oncology

## 2015-11-02 ENCOUNTER — Ambulatory Visit (HOSPITAL_BASED_OUTPATIENT_CLINIC_OR_DEPARTMENT_OTHER): Payer: 59

## 2015-11-02 ENCOUNTER — Encounter: Payer: Self-pay | Admitting: Hematology and Oncology

## 2015-11-02 ENCOUNTER — Encounter: Payer: Self-pay | Admitting: *Deleted

## 2015-11-02 ENCOUNTER — Telehealth: Payer: Self-pay | Admitting: Hematology and Oncology

## 2015-11-02 VITALS — BP 116/72 | HR 83 | Temp 97.9°F | Resp 18 | Ht 62.0 in | Wt 148.6 lb

## 2015-11-02 DIAGNOSIS — R140 Abdominal distension (gaseous): Secondary | ICD-10-CM | POA: Diagnosis not present

## 2015-11-02 DIAGNOSIS — C50411 Malignant neoplasm of upper-outer quadrant of right female breast: Secondary | ICD-10-CM | POA: Diagnosis not present

## 2015-11-02 DIAGNOSIS — Z5112 Encounter for antineoplastic immunotherapy: Secondary | ICD-10-CM | POA: Diagnosis not present

## 2015-11-02 MED ORDER — SODIUM CHLORIDE 0.9 % IV SOLN
Freq: Once | INTRAVENOUS | Status: AC
  Administered 2015-11-02: 09:00:00 via INTRAVENOUS

## 2015-11-02 MED ORDER — ACETAMINOPHEN 325 MG PO TABS
ORAL_TABLET | ORAL | Status: AC
  Filled 2015-11-02: qty 2

## 2015-11-02 MED ORDER — ACETAMINOPHEN 500 MG PO TABS
ORAL_TABLET | ORAL | Status: AC
  Filled 2015-11-02: qty 2

## 2015-11-02 MED ORDER — ACETAMINOPHEN 325 MG PO TABS
650.0000 mg | ORAL_TABLET | Freq: Once | ORAL | Status: AC
  Administered 2015-11-02: 650 mg via ORAL

## 2015-11-02 MED ORDER — TRASTUZUMAB CHEMO INJECTION 440 MG
6.0000 mg/kg | Freq: Once | INTRAVENOUS | Status: AC
  Administered 2015-11-02: 399 mg via INTRAVENOUS
  Filled 2015-11-02: qty 19

## 2015-11-02 NOTE — Progress Notes (Signed)
Unable to get in to exam room prior to MD.  No assessment performed.  

## 2015-11-02 NOTE — Telephone Encounter (Signed)
left msg..added lab on may 9 per pof

## 2015-11-02 NOTE — Patient Instructions (Signed)
Trastuzumab injection for infusion What is this medicine? TRASTUZUMAB (tras TOO zoo mab) is a monoclonal antibody. It is used to treat breast cancer and stomach cancer. This medicine may be used for other purposes; ask your health care provider or pharmacist if you have questions. What should I tell my health care provider before I take this medicine? They need to know if you have any of these conditions: -heart disease -heart failure -infection (especially a virus infection such as chickenpox, cold sores, or herpes) -lung or breathing disease, like asthma -recent or ongoing radiation therapy -an unusual or allergic reaction to trastuzumab, benzyl alcohol, or other medications, foods, dyes, or preservatives -pregnant or trying to get pregnant -breast-feeding How should I use this medicine? This drug is given as an infusion into a vein. It is administered in a hospital or clinic by a specially trained health care professional. Talk to your pediatrician regarding the use of this medicine in children. This medicine is not approved for use in children. Overdosage: If you think you have taken too much of this medicine contact a poison control center or emergency room at once. NOTE: This medicine is only for you. Do not share this medicine with others. What if I miss a dose? It is important not to miss a dose. Call your doctor or health care professional if you are unable to keep an appointment. What may interact with this medicine? -doxorubicin -warfarin This list may not describe all possible interactions. Give your health care provider a list of all the medicines, herbs, non-prescription drugs, or dietary supplements you use. Also tell them if you smoke, drink alcohol, or use illegal drugs. Some items may interact with your medicine. What should I watch for while using this medicine? Visit your doctor for checks on your progress. Report any side effects. Continue your course of treatment even  though you feel ill unless your doctor tells you to stop. Call your doctor or health care professional for advice if you get a fever, chills or sore throat, or other symptoms of a cold or flu. Do not treat yourself. Try to avoid being around people who are sick. You may experience fever, chills and shaking during your first infusion. These effects are usually mild and can be treated with other medicines. Report any side effects during the infusion to your health care professional. Fever and chills usually do not happen with later infusions. Do not become pregnant while taking this medicine or for 7 months after stopping it. Women should inform their doctor if they wish to become pregnant or think they might be pregnant. Women of child-bearing potential will need to have a negative pregnancy test before starting this medicine. There is a potential for serious side effects to an unborn child. Talk to your health care professional or pharmacist for more information. Do not breast-feed an infant while taking this medicine or for 7 months after stopping it. Women must use effective birth control with this medicine. What side effects may I notice from receiving this medicine? Side effects that you should report to your doctor or other health care professional as soon as possible: -breathing difficulties -chest pain or palpitations -cough -dizziness or fainting -fever or chills, sore throat -skin rash, itching or hives -swelling of the legs or ankles -unusually weak or tired Side effects that usually do not require medical attention (report to your doctor or other health care professional if they continue or are bothersome): -loss of appetite -headache -muscle aches -nausea This   list may not describe all possible side effects. Call your doctor for medical advice about side effects. You may report side effects to FDA at 1-800-FDA-1088. Where should I keep my medicine? This drug is given in a hospital  or clinic and will not be stored at home. NOTE: This sheet is a summary. It may not cover all possible information. If you have questions about this medicine, talk to your doctor, pharmacist, or health care provider.    2016, [employer]/Gold Standard. (2014-10-09 11:49:32)   

## 2015-11-02 NOTE — Progress Notes (Signed)
Patient Care Team: Olin Hauser, DO as PCP - General (Osteopathic Medicine)  SUMMARY OF ONCOLOGIC HISTORY:   Breast cancer of upper-outer quadrant of right female breast ([place])   08/10/2015 Breast MRI Enhancing mass in right breast upper outer quadrant 8 x 5 x 7 mm, no retroareolar right breast abnormality, no abnormality of the site of nipple retraction   08/18/2015 Initial Biopsy Right breast biopsy: Invasive ductal carcinoma with DCIS, grade 2 with focal necrosis,ER 100%, PR 0%, Ki-67 5%,HER-2 positive ratio 4.52   08/19/2015 Mammogram Right breast calcifications now seem to span 4 x 2 x 1.5 cm   09/15/2015 Surgery Rt mastectomy: IDC Grade 2, 0.18 cm, with DCIS, 0/1 LN, ER 100%, PR 0%, HER2 Positive 4.52, T1aN0M0 (Stage 1A)   10/12/2015 -  Chemotherapy Herceptin every 3 weeks 3 cycles (based on [facility] trial: Short herceptin protocol)    CHIEF COMPLIANT: Cycle 2 Herceptin  INTERVAL HISTORY: Nichole Wilson is a 55 year old with above-mentioned history of right breast cancer HER-2 positive disease who is currently on Herceptin alone. Because of final tumor size was so small, we elected to not use chemotherapy backbone. She is getting 3 doses of Herceptin only. Today is cycle 2 of Herceptin and she appears to have tolerated cycle one extremely well. She did however have profound drowsiness related to the Benadryl IV.  REVIEW OF SYSTEMS:   Constitutional: Denies fevers, chills or abnormal weight loss Eyes: Denies blurriness of vision Ears, nose, mouth, throat, and face: Denies mucositis or sore throat Respiratory: Denies cough, dyspnea or wheezes Cardiovascular: Denies palpitation, chest discomfort Gastrointestinal:  Denies nausea, heartburn or change in bowel habits Skin: Denies abnormal skin rashes Lymphatics: Denies new lymphadenopathy or easy bruising Neurological:Denies numbness, tingling or new weaknesses Behavioral/Psych: Mood is stable, no new changes  Extremities: No lower  extremity edema Breast:  denies any pain or lumps or nodules in either breasts All other systems were reviewed with the patient and are negative.  I have reviewed the past medical history, past surgical history, social history and family history with the patient and they are unchanged from previous note.  ALLERGIES:  has No Known Allergies.  MEDICATIONS:  Current Outpatient Prescriptions  Medication Sig Dispense Refill  . acetaminophen (TYLENOL) 500 MG tablet Take 1,000 mg by mouth every 8 (eight) hours as needed for mild pain.    [name] aspirin 81 MG tablet Take 81 mg by mouth daily.    . ciprofloxacin (CIPRO) 500 MG tablet Take 1 tablet (500 mg total) by mouth 2 (two) times daily. 10 tablet 0  . fenofibrate 160 MG tablet Take 160 mg by mouth daily.    . hydrochlorothiazide (HYDRODIURIL) 12.5 MG tablet Take 1 tablet (12.5 mg total) by mouth daily. 90 tablet 3  . HYDROcodone-acetaminophen (NORCO/VICODIN) 5-325 MG tablet Take 1-2 tablets by mouth every 6 (six) hours as needed for moderate pain or severe pain. (Patient not taking: Reported on 09/27/2015) 30 tablet 0  . Icosapent Ethyl 1 g CAPS Take 2 g by mouth 2 (two) times daily with a meal. 120 capsule 5  . metFORMIN (GLUCOPHAGE) 500 MG tablet Take 500 mg by mouth daily.    . Multiple Vitamin (MULTIVITAMIN WITH MINERALS) TABS Take 1 tablet by mouth daily.     No current facility-administered medications for this visit.   Facility-Administered Medications Ordered in Other Visits  Medication Dose Route Frequency Provider Last Rate Last Dose  . trastuzumab (HERCEPTIN) 399 mg in sodium chloride 0.9 % 250  mL chemo infusion  6 mg/kg (Treatment Plan Actual) Intravenous Once Nicholas Lose, MD        PHYSICAL EXAMINATION: ECOG PERFORMANCE STATUS: 1 - Symptomatic but completely ambulatory  Filed Vitals:   11/02/15 0825  BP: 116/72  Pulse: 83  Temp: 97.9 F (36.6 C)  Resp: 18   Filed Weights   11/02/15 0825  Weight: 148 lb 9.6 oz (67.405 kg)      GENERAL:alert, no distress and comfortable SKIN: skin color, texture, turgor are normal, no rashes or significant lesions EYES: normal, Conjunctiva are pink and non-injected, sclera clear OROPHARYNX:no exudate, no erythema and lips, buccal mucosa, and tongue normal  NECK: supple, thyroid normal size, non-tender, without nodularity LYMPH:  no palpable lymphadenopathy in the cervical, axillary or inguinal LUNGS: clear to auscultation and percussion with normal breathing effort HEART: regular rate & rhythm and no murmurs and no lower extremity edema ABDOMEN:abdomen soft, non-tender and normal bowel sounds MUSCULOSKELETAL:no cyanosis of digits and no clubbing  NEURO: alert & oriented x 3 with fluent speech, no focal motor/sensory deficits EXTREMITIES: No lower extremity edema  LABORATORY DATA:  I have reviewed the data as listed   Chemistry      Component Value Date/Time   NA 142 10/12/2015 0909   NA 143 09/14/2015 0932   K 3.5 10/12/2015 0909   K 3.9 09/14/2015 0932   CL 108 09/14/2015 0932   CO2 24 10/12/2015 0909   CO2 25 09/14/2015 0932   BUN 14.2 10/12/2015 0909   BUN 14 09/14/2015 0932   CREATININE 0.8 10/12/2015 0909   CREATININE 0.62 09/14/2015 0932   CREATININE 0.52 05/12/2015 1117      Component Value Date/Time   CALCIUM 9.7 10/12/2015 0909   CALCIUM 9.7 09/14/2015 0932   ALKPHOS 90 10/12/2015 0909   ALKPHOS 71 09/14/2015 0932   AST 22 10/12/2015 0909   AST 32 09/14/2015 0932   ALT 38 10/12/2015 0909   ALT 36 09/14/2015 0932   BILITOT 0.37 10/12/2015 0909   BILITOT 0.6 09/14/2015 0932       Lab Results  Component Value Date   WBC 4.8 10/12/2015   HGB 12.3 10/12/2015   HCT 36.6 10/12/2015   MCV 92.9 10/12/2015   PLT 364 10/12/2015   NEUTROABS 2.6 10/12/2015     ASSESSMENT & PLAN:  Breast cancer of upper-outer quadrant of right female breast ([place]) Rt mastectomy 09/15/15: IDC Grade 2, 0.18 cm, with DCIS, 0/1 LN, ER 100%, PR 0%, HER2 Positive 4.52,  T1aN0M0 (Stage 1A) Final tumor size: 0.35 cm on biopsy +0.18 cm on final lumpectomy equals 0.53 cm  Treatment plan: 1. shorter duration of anti-HER-2 therapy based upon [facility] trial where they used only 9 weeks of Herceptin treatment. Herceptin q 3 weeks 3 cycles.  2. Adjuvant antiestrogen therapy with anastrozole 1 mg daily  Echocardiogram 10/01/2015: EF 60-65%  Current treatment: Today cycle 2 day 1 Herceptin Severe drowsiness related to Benadryl: We will discontinue Benadryl  We will be monitoring closely for toxicities.  Gastric biopsy: Revealed granulomatous inflammation suggestive of sarcoidosis versus Crohn's disease. Patient sees Dr. Paulita Fujita with gastroenterology. I gave her a copy of her pathology report. I instructed her to make an appointment to see him to discuss this further. Based on the prior CT scans she does not have any other evidence of diagnosis. She does however complain of abdominal bloating symptoms. Return to Clinic in 3 weeks for cycle 2    No orders of the defined types were  placed in this encounter.   The patient has a good understanding of the overall plan. she agrees with it. she will call with any problems that may develop before the next visit here.   Rulon Eisenmenger, MD 11/02/2015

## 2015-11-02 NOTE — Assessment & Plan Note (Signed)
Rt mastectomy 09/15/15: IDC Grade 2, 0.18 cm, with DCIS, 0/1 LN, ER 100%, PR 0%, HER2 Positive 4.52, T1aN0M0 (Stage 1A) Final tumor size: 0.35 cm on biopsy +0.18 cm on final lumpectomy equals 0.53 cm  Treatment plan: 1. shorter duration of anti-HER-2 therapy based upon [facility] trial where they used only 9 weeks of Herceptin treatment. Herceptin q 3 weeks 3 cycles.  2. Adjuvant antiestrogen therapy with anastrozole 1 mg daily  Echocardiogram 10/01/2015: EF 60-65%  Current treatment: Today cycle 2 day 1 Herceptin Severe drowsiness related to Benadryl: We will discontinue Benadryl  We will be monitoring closely for toxicities.  Gastric biopsy: Revealed granulomatous inflammation suggestive of sarcoidosis versus Crohn's disease. Patient sees Dr. Paulita Fujita with gastroenterology. I gave her a copy of her pathology report. I instructed her to make an appointment to see him to discuss this further. Based on the prior CT scans she does not have any other evidence of diagnosis. She does however complain of abdominal bloating symptoms. Return to Clinic in 3 weeks for cycle 2

## 2015-11-03 ENCOUNTER — Ambulatory Visit: Payer: 59

## 2015-11-03 DIAGNOSIS — R222 Localized swelling, mass and lump, trunk: Secondary | ICD-10-CM | POA: Diagnosis not present

## 2015-11-03 DIAGNOSIS — M25611 Stiffness of right shoulder, not elsewhere classified: Secondary | ICD-10-CM

## 2015-11-03 DIAGNOSIS — M25511 Pain in right shoulder: Secondary | ICD-10-CM | POA: Diagnosis not present

## 2015-11-03 NOTE — Therapy (Signed)
[place], [place], [address] Phone: [phone]   Fax:  [phone]  Physical Therapy Treatment  Patient Details  Name: Nichole Wilson MRN: [id number] Date of Birth: [birth year] Referring Provider: Rolm Bookbinder   Encounter Date: 11/03/2015      PT End of Session - 11/03/15 0943    Visit Number 11   Number of Visits 17   Date for PT Re-Evaluation 12/04/15   PT Start Time 0847   PT Stop Time 0941   PT Time Calculation (min) 54 min   Activity Tolerance Patient tolerated treatment well   Behavior During Therapy [facility] for tasks assessed/performed      Past Medical History  Diagnosis Date  . Hypertriglyceridemia   . Hepatic steatosis   . Breast cyst, left 01-04-12    cyst no longer apparent  . Migraine without aura, without mention of intractable migraine without mention of status migrainosus   . Hypercholesterolemia with hypertriglyceridemia   . Cholelithiasis with choledocholithiasis   . Glucose intolerance (impaired glucose tolerance)     since at least 2011.   . Gallstone pancreatitis   . Hypertension 01-04-12    med d/c-last taken 12-07-11  -PT [place] STOPPED HER B/P MEDICATION BECAUSE HER B/P TOO LOW AND PT STATES HER B/P'S HAVE BEEN OK SINCE.  [name] Complication of anesthesia 2002    PT STATES DURING HER C-SECTION SHE WAS AWARE OF INCISION AND PAIN -THEN WAS QUICKLY "PUT OUT"  . Chronic calculus cholecystitis 12/25/2011    Lap chole on 03/25/12   . PONV (postoperative nausea and vomiting)   . Pre-diabetes   . Cancer [facility])     right breast cancer    Past Surgical History  Procedure Laterality Date  . Cesarean section      2002  . Ercp  12/05/2011    Procedure: ENDOSCOPIC RETROGRADE CHOLANGIOPANCREATOGRAPHY (ERCP);  Surgeon: Inda Castle, MD;  Location: [facility];  Service: Endoscopy;  Laterality: N/A;  . Ercp  12/08/2011    Procedure: ENDOSCOPIC RETROGRADE  CHOLANGIOPANCREATOGRAPHY (ERCP);  Surgeon: Inda Castle, MD;  Location: [facility];  Service: Gastroenterology;  Laterality: N/A;  . Cholecystectomy  03/25/2012    Procedure: LAPAROSCOPIC CHOLECYSTECTOMY WITH INTRAOPERATIVE CHOLANGIOGRAM;  Surgeon: Haywood Lasso, MD;  Location: [facility];  Service: General;  Laterality: N/A;  Laparoscopic Cholecystectomy with Intraoperative Cholangiogram  . Mastectomy w/ sentinel node biopsy Right 09/15/2015    Procedure: RIGHT MASTECTOMY WITH SENTINEL LYMPH NODE BIOPSY;  Surgeon: Rolm Bookbinder, MD;  Location: [facility];  Service: General;  Laterality: Right;  . Esophagogastroduodenoscopy (egd) with propofol N/A 09/28/2015    Procedure: ESOPHAGOGASTRODUODENOSCOPY (EGD) WITH PROPOFOL;  Surgeon: Clarene Essex, MD;  Location: [facility] ENDOSCOPY;  Service: Endoscopy;  Laterality: N/A;  H AND P IN DRAWER  . Eus N/A 10/13/2015    Procedure: UPPER ENDOSCOPIC ULTRASOUND (EUS) RADIAL;  Surgeon: Arta Silence, MD;  Location: [facility];  Service: Endoscopy;  Laterality: N/A;    There were no vitals filed for this visit.      Subjective Assessment - 11/03/15 0852    Subjective I saw Dr. Lindi Adie yesterday and he told me the results of my endoscopy US biopsy were either sarcoidosis (wrote that down for pt per her request) versus Crohns Disease. She will need to make an appt with a GI doctor now. My Rt shoulder is feeling better. I'm getting more control of it now instead of it controlling me.    Pertinent  History right breast cancer with mastetomy on 09/15/15 sentinel node biopsy. She has not chemotherapy or radiation and has not plans  for it    Patient Stated Goals to be able to riase her hand all the way and to get stronger    Currently in Pain? No/denies                         [facility] Adult PT Treatment/Exercise - 11/03/15 0001    Shoulder Exercises: Standing   Horizontal ABduction Strengthening;Both;15 reps;Theraband   Theraband Level (Shoulder Horizontal  ABduction) Level 1 (Yellow)   External Rotation Strengthening;Right;10 reps;Theraband  Verbal cue to remind to keep elbow at 90 degrees   Theraband Level (Shoulder External Rotation) Level 1 (Yellow)   Internal Rotation Strengthening;Right;10 reps   Theraband Level (Shoulder Internal Rotation) Level 1 (Yellow)   Flexion Strengthening;Right;10 reps;Theraband  Pt did with elbow extension today and great technique   Theraband Level (Shoulder Flexion) Level 1 (Yellow)   Extension Strengthening;Right;10 reps;Theraband  Elbow extended, verbal cuing for scapular retraction    Theraband Level (Shoulder Extension) Level 1 (Yellow)   Shoulder Exercises: Pulleys   Flexion 3 minutes   ABduction 3 minutes   Shoulder Exercises: Therapy Ball   Flexion 10 reps  With forward lean at top of stretch   ABduction 10 reps  Rt UE with Rt side lean at top of stretch   Manual Therapy   Passive ROM In Supine to Rt shoulder into flexion, abuction, and ER/IR all to pts tolerance, in left sidelying,scapular mobility with emphasis on protraction and moving arm rhythmically through flexion                        [facility] Goals - 11/01/15 0816    CC Long Term Goal  #1   Baseline 10/22/15 - pt given sheet with risk reduction practices   Time 4   Period Weeks   Status On-going   CC Long Term Goal  #2   Title Patient will be independent in a home exercise program   Time 4   Period Weeks   Status On-going   CC Long Term Goal  #4   Title Patient will improve right  shoulder abduction to 140 degrees so that she can return to previous functional level    Baseline 10/22/15- pt is at 110 degrees, 11/01/15- 134 degrees   Time 4   Period Weeks   Status On-going   CC Long Term Goal  #5   Title Patient will decrease the DASH score to < 20    to demonstrate increased functional use of upper extremity   Time 4   Period Weeks   Status On-going            Plan - 11/03/15 0948    Clinical  Impression Statement Pt did well with review of HEP today only requiring minor verbal cuing for technique with a few exercises and able to correct quickly. Pt continues to report benefit of PROM stretching imporving her ADLs and has even noticed being able to sleep on Rt side some lately as well without increase pain.    Rehab Potential Excellent   Clinical Impairments Affecting Rehab Potential none   PT Frequency 2x / week   PT Duration 4 weeks   PT Treatment/Interventions ADLs/Self Care Home Management;Manual lymph drainage;Scar mobilization;Passive range of motion;Patient/family education;DME Instruction;Therapeutic activities;Taping;Manual techniques;Therapeutic exercise   PT Next Visit  Plan add new ROM and strength goals as pt meets goals, PROM/AAROM/AROM and strengthening   Consulted and Agree with Plan of Care Patient      Patient will benefit from skilled therapeutic intervention in order to improve the following deficits and impairments:  Increased fascial restricitons, Impaired UE functional use, Decreased knowledge of precautions, Decreased skin integrity, Pain, Decreased scar mobility, Decreased knowledge of use of DME, Decreased strength, Increased edema, Postural dysfunction  Visit Diagnosis: Stiffness of right shoulder, not elsewhere classified     Problem List Patient Active Problem List   Diagnosis Date Noted  . Gastric polyp 09/13/2015  . Breast cancer of upper-outer quadrant of right female breast ([place]) 08/26/2015  . Breast calcification, right 07/27/2015  . Inversion, nipple 07/27/2015  . Fatigue 07/22/2015  . Anterior scleritis of both eyes 05/12/2015  . Overweight (BMI 25.0-29.9) 05/12/2015  . Pre-diabetes 05/12/2015  . Essential hypertension, benign 09/05/2013  . Plantar fasciitis 09/05/2013  . Health care maintenance 03/24/2013  . Epidermal cyst 06/17/2012  . Sciatica 05/17/2012  . Hepatic steatosis 01/02/2012  . Sweating 12/12/2011  . Calculus of bile  duct without mention of cholecystitis or obstruction 12/05/2011  . Mastitis 10/19/2010  . Hypercholesterolemia with hypertriglyceridemia 02/24/2009  . HYPERGLYCEMIA 02/24/2009  . Asymptomatic postmenopausal status 02/23/2009  . COMMON MIGRAINE 01/09/2007  . HEMORRHOIDS, NOS 09/13/2006    Otelia Limes, PTA 11/03/2015, 9:51 AM  [place], [place], [address] Phone: [phone]   Fax:  [phone]  Name: Nichole Wilson MRN: [id number] Date of Birth: 11-26-1960

## 2015-11-09 ENCOUNTER — Ambulatory Visit: Payer: 59

## 2015-11-09 DIAGNOSIS — M25511 Pain in right shoulder: Secondary | ICD-10-CM | POA: Diagnosis not present

## 2015-11-09 DIAGNOSIS — R222 Localized swelling, mass and lump, trunk: Secondary | ICD-10-CM | POA: Diagnosis not present

## 2015-11-09 DIAGNOSIS — M25611 Stiffness of right shoulder, not elsewhere classified: Secondary | ICD-10-CM | POA: Diagnosis not present

## 2015-11-09 NOTE — Therapy (Signed)
[place], [place], [address] Phone: [phone]   Fax:  [phone]  Physical Therapy Treatment  Patient Details  Name: Nichole Wilson MRN: [id number] Date of Birth: [birth year] Referring Provider: Rolm Bookbinder   Encounter Date: 11/09/2015      PT End of Session - 11/09/15 0830    Visit Number 12   Number of Visits 17   Date for PT Re-Evaluation 12/04/15   PT Start Time 0806  Pt arrived late   PT Stop Time 0847   PT Time Calculation (min) 41 min   Activity Tolerance Patient tolerated treatment well   Behavior During Therapy [facility] for tasks assessed/performed      Past Medical History  Diagnosis Date  . Hypertriglyceridemia   . Hepatic steatosis   . Breast cyst, left 01-04-12    cyst no longer apparent  . Migraine without aura, without mention of intractable migraine without mention of status migrainosus   . Hypercholesterolemia with hypertriglyceridemia   . Cholelithiasis with choledocholithiasis   . Glucose intolerance (impaired glucose tolerance)     since at least 2011.   . Gallstone pancreatitis   . Hypertension 01-04-12    med d/c-last taken 12-07-11  -PT [place] STOPPED HER B/P MEDICATION BECAUSE HER B/P TOO LOW AND PT STATES HER B/P'S HAVE BEEN OK SINCE.  [name] Complication of anesthesia 2002    PT STATES DURING HER C-SECTION SHE WAS AWARE OF INCISION AND PAIN -THEN WAS QUICKLY "PUT OUT"  . Chronic calculus cholecystitis 12/25/2011    Lap chole on 03/25/12   . PONV (postoperative nausea and vomiting)   . Pre-diabetes   . Cancer [facility])     right breast cancer    Past Surgical History  Procedure Laterality Date  . Cesarean section      2002  . Ercp  12/05/2011    Procedure: ENDOSCOPIC RETROGRADE CHOLANGIOPANCREATOGRAPHY (ERCP);  Surgeon: Inda Castle, MD;  Location: [facility];  Service: Endoscopy;  Laterality: N/A;  . Ercp  12/08/2011    Procedure: ENDOSCOPIC  RETROGRADE CHOLANGIOPANCREATOGRAPHY (ERCP);  Surgeon: Inda Castle, MD;  Location: [place];  Service: Gastroenterology;  Laterality: N/A;  . Cholecystectomy  03/25/2012    Procedure: LAPAROSCOPIC CHOLECYSTECTOMY WITH INTRAOPERATIVE CHOLANGIOGRAM;  Surgeon: Haywood Lasso, MD;  Location: [facility];  Service: General;  Laterality: N/A;  Laparoscopic Cholecystectomy with Intraoperative Cholangiogram  . Mastectomy w/ sentinel node biopsy Right 09/15/2015    Procedure: RIGHT MASTECTOMY WITH SENTINEL LYMPH NODE BIOPSY;  Surgeon: Rolm Bookbinder, MD;  Location: [place];  Service: General;  Laterality: Right;  . Esophagogastroduodenoscopy (egd) with propofol N/A 09/28/2015    Procedure: ESOPHAGOGASTRODUODENOSCOPY (EGD) WITH PROPOFOL;  Surgeon: Clarene Essex, MD;  Location: [facility];  Service: Endoscopy;  Laterality: N/A;  H AND P IN DRAWER  . Eus N/A 10/13/2015    Procedure: UPPER ENDOSCOPIC ULTRASOUND (EUS) RADIAL;  Surgeon: Arta Silence, MD;  Location: [facility];  Service: Endoscopy;  Laterality: N/A;    There were no vitals filed for this visit.      Subjective Assessment - 11/09/15 0808    Subjective I haven't made an appt with a GI doctor yet but am planning to do that soon. Rt shoulder is still doing good.     Pertinent History right breast cancer with mastetomy on 09/15/15 sentinel node biopsy. She has not chemotherapy or radiation and has not plans  for it    Patient Stated Goals to be  able to riase her hand all the way and to get stronger    Currently in Pain? No/denies            [facility] PT Assessment - 11/09/15 0001    AROM   Right Shoulder Flexion 154 Degrees   Right Shoulder ABduction 146 Degrees   Right Shoulder External Rotation 87 Degrees                     [facility] Adult PT Treatment/Exercise - 11/09/15 0001    Shoulder Exercises: Standing   Flexion Strengthening;Both;12 reps;Weights  To 90 degrees    Shoulder Flexion Weight (lbs) 2   ABduction  Strengthening;Both;12 reps;Weights  To 90 degrees   Shoulder ABduction Weight (lbs) 2   Row Strengthening;Both;10 reps;Weights  Using chair to support correct position   Row Weight (lbs) 2   Other Standing Exercises Standing scaption to 90 degrees with 2 lbs 12 reps bil UE   Other Standing Exercises Wall Push ups 2 sets of 10 reps, tavtile cuing and demonstration for correct technique.    Shoulder Exercises: Pulleys   Flexion 2 minutes   ABduction 2 minutes   Shoulder Exercises: Therapy Ball   Flexion 10 reps  With forward lean at top of stretch   ABduction 10 reps  Rt UE with Rt side bend at top of stretch   Shoulder Exercises: ROM/Strengthening   "W" Arms 10 reps standing against wall   Manual Therapy   Passive ROM In Supine to Rt shoulder into flexion, abuction, and ER all to pts tolerance                        [facility] Term Clinic Goals - 11/09/15 9735    CC Long Term Goal  #1   Title Patient with verbalize an understanding of lymphedema risk reduction precautions   Status On-going   CC Long Term Goal  #2   Title Patient will be independent in a home exercise program   Status Partially Met   CC Long Term Goal  #4   Title Patient will improve right  shoulder abduction to 140 degrees so that she can return to previous functional level    Baseline 10/22/15- pt is at 110 degrees, 11/01/15- 134 degrees, 146 degrees 11/09/15   Status Achieved   CC Long Term Goal  #5   Title Patient will decrease the DASH score to < 20    to demonstrate increased functional use of upper extremity   Status On-going            Plan - 11/09/15 0830    Clinical Impression Statement Pt did well with new strengthening exercises and reported no pain after, just felt like the muscles "woke up", especially in the shoulder blades. And pt had great improvement with her AROM abduction this week meeting this goal. Pt is making great progress and will benfot from cpontinuiing PT for a few more  sessions to finalize her HEP and pt agrees.    Rehab Potential Excellent   Clinical Impairments Affecting Rehab Potential none   PT Frequency 2x / week   PT Duration 4 weeks   PT Treatment/Interventions ADLs/Self Care Home Management;Manual lymph drainage;Scar mobilization;Passive range of motion;Patient/family education;DME Instruction;Therapeutic activities;Taping;Manual techniques;Therapeutic exercise   PT Next Visit Plan add new ROM and strength goals as pt meets goals, PROM/AAROM/AROM and strengthening. Finalize HEP and possible D/C in next 2-3 sessions.    Consulted and Agree  with Plan of Care Patient      Patient will benefit from skilled therapeutic intervention in order to improve the following deficits and impairments:  Increased fascial restricitons, Impaired UE functional use, Decreased knowledge of precautions, Decreased skin integrity, Pain, Decreased scar mobility, Decreased knowledge of use of DME, Decreased strength, Increased edema, Postural dysfunction  Visit Diagnosis: Stiffness of right shoulder, not elsewhere classified     Problem List Patient Active Problem List   Diagnosis Date Noted  . Gastric polyp 09/13/2015  . Breast cancer of upper-outer quadrant of right female breast ([place]) 08/26/2015  . Breast calcification, right 07/27/2015  . Inversion, nipple 07/27/2015  . Fatigue 07/22/2015  . Anterior scleritis of both eyes 05/12/2015  . Overweight (BMI 25.0-29.9) 05/12/2015  . Pre-diabetes 05/12/2015  . Essential hypertension, benign 09/05/2013  . Plantar fasciitis 09/05/2013  . Health care maintenance 03/24/2013  . Epidermal cyst 06/17/2012  . Sciatica 05/17/2012  . Hepatic steatosis 01/02/2012  . Sweating 12/12/2011  . Calculus of bile duct without mention of cholecystitis or obstruction 12/05/2011  . Mastitis 10/19/2010  . Hypercholesterolemia with hypertriglyceridemia 02/24/2009  . HYPERGLYCEMIA 02/24/2009  . Asymptomatic postmenopausal status  02/23/2009  . COMMON MIGRAINE 01/09/2007  . HEMORRHOIDS, NOS 09/13/2006    Otelia Limes, PTA 11/09/2015, 8:50 AM  [place], [place], [address] Phone: [phone]   Fax:  [phone]  Name: LAUREEN FREDERIC MRN: [id number] Date of Birth: [birth year]

## 2015-11-11 ENCOUNTER — Ambulatory Visit: Payer: 59

## 2015-11-12 ENCOUNTER — Telehealth: Payer: Self-pay | Admitting: Internal Medicine

## 2015-11-15 ENCOUNTER — Ambulatory Visit: Payer: 59 | Attending: General Surgery

## 2015-11-15 DIAGNOSIS — M25611 Stiffness of right shoulder, not elsewhere classified: Secondary | ICD-10-CM | POA: Diagnosis not present

## 2015-11-15 NOTE — Therapy (Addendum)
[place], [place], [address] Phone: [phone]   Fax:  [phone]  Physical Therapy Treatment  Patient Details  Name: Nichole Wilson MRN: [id number] Date of Birth: [birth year] Referring Provider: Rolm Bookbinder   Encounter Date: 11/15/2015      PT End of Session - 11/15/15 0841    Visit Number 13   Number of Visits 17   Date for PT Re-Evaluation 12/04/15   PT Start Time 0814  Pt arrived late   PT Stop Time 0845   PT Time Calculation (min) 31 min   Activity Tolerance Patient tolerated treatment well   Behavior During Therapy [facility] for tasks assessed/performed      Past Medical History  Diagnosis Date  . Hypertriglyceridemia   . Hepatic steatosis   . Breast cyst, left 01-04-12    cyst no longer apparent  . Migraine without aura, without mention of intractable migraine without mention of status migrainosus   . Hypercholesterolemia with hypertriglyceridemia   . Cholelithiasis with choledocholithiasis   . Glucose intolerance (impaired glucose tolerance)     since at least 2011.   . Gallstone pancreatitis   . Hypertension 01-04-12    med d/c-last taken 12-07-11  -PT [facility] STOPPED HER B/P MEDICATION BECAUSE HER B/P TOO LOW AND PT STATES HER B/P'S HAVE BEEN OK SINCE.  [name] Complication of anesthesia 2002    PT STATES DURING HER C-SECTION SHE WAS AWARE OF INCISION AND PAIN -THEN WAS QUICKLY "PUT OUT"  . Chronic calculus cholecystitis 12/25/2011    Lap chole on 03/25/12   . PONV (postoperative nausea and vomiting)   . Pre-diabetes   . Cancer [facility])     right breast cancer    Past Surgical History  Procedure Laterality Date  . Cesarean section      2002  . Ercp  12/05/2011    Procedure: ENDOSCOPIC RETROGRADE CHOLANGIOPANCREATOGRAPHY (ERCP);  Surgeon: Inda Castle, MD;  Location: [facility];  Service: Endoscopy;  Laterality: N/A;  . Ercp  12/08/2011    Procedure: ENDOSCOPIC  RETROGRADE CHOLANGIOPANCREATOGRAPHY (ERCP);  Surgeon: Inda Castle, MD;  Location: [place];  Service: Gastroenterology;  Laterality: N/A;  . Cholecystectomy  03/25/2012    Procedure: LAPAROSCOPIC CHOLECYSTECTOMY WITH INTRAOPERATIVE CHOLANGIOGRAM;  Surgeon: Haywood Lasso, MD;  [facility];  Service: General;  Laterality: N/A;  Laparoscopic Cholecystectomy with Intraoperative Cholangiogram  . Mastectomy w/ sentinel node biopsy Right 09/15/2015    Procedure: RIGHT MASTECTOMY WITH SENTINEL LYMPH NODE BIOPSY;  Surgeon: Rolm Bookbinder, MD;  Location: [place];  Service: General;  Laterality: Right;  . Esophagogastroduodenoscopy (egd) with propofol N/A 09/28/2015    Procedure: ESOPHAGOGASTRODUODENOSCOPY (EGD) WITH PROPOFOL;  Surgeon: Clarene Essex, MD;  Location: [facility];  Service: Endoscopy;  Laterality: N/A;  H AND P IN DRAWER  . Eus N/A 10/13/2015    Procedure: UPPER ENDOSCOPIC ULTRASOUND (EUS) RADIAL;  Surgeon: Arta Silence, MD;  [facility];  Service: Endoscopy;  Laterality: N/A;    There were no vitals filed for this visit.      Subjective Assessment - 11/15/15 0816    Subjective Did alot of extra cooking this weekend and my shoulder did really good. I think I'm ready to D/C!   Pertinent History right breast cancer with mastetomy on 09/15/15 sentinel node biopsy. She has not chemotherapy or radiation and has not plans  for it    Patient Stated Goals to be able to riase her hand all  the way and to get stronger    Currently in Pain? No/denies            [facility] PT Assessment - 11/15/15 0001    AROM   Right Shoulder Flexion 163 Degrees   Right Shoulder ABduction 155 Degrees              Quick Dash - 11/15/15 0001    Open a tight or new jar No difficulty   Do heavy household chores (wash walls, wash floors) No difficulty   Carry a shopping bag or briefcase No difficulty   Wash your back No difficulty   Use a knife to cut food No difficulty   Recreational  activities in which you take some force or impact through your arm, shoulder, or hand (golf, hammering, tennis) No difficulty   During the past week, to what extent has your arm, shoulder or hand problem interfered with your normal social activities with family, friends, neighbors, or groups? Not at all   During the past week, to what extent has your arm, shoulder or hand problem limited your work or other regular daily activities Not at all   Arm, shoulder, or hand pain. None   Tingling (pins and needles) in your arm, shoulder, or hand Mild   Difficulty Sleeping No difficulty   DASH Score 2.27 %               [facility] Adult PT Treatment/Exercise - 11/15/15 0001    Self-Care   Other Self-Care Comments  Educated pt about lymphedema risk reduction practices and infection prevention, answering her questions.   Shoulder Exercises: Pulleys   Flexion 2 minutes   ABduction 2 minutes   Shoulder Exercises: Therapy Ball   Flexion 10 reps  With forward lean at top of stretch   ABduction 10 reps  Rt UE with Lt side bend at top of stretch   Shoulder Exercises: ROM/Strengthening   Other ROM/Strengthening Exercises Standing 3 way raises with 2 lbs, 10 reps each direction; vrebal cuing to decrease shoulder hiking.                 PT Education - 11/15/15 0845    Education provided Yes   Education Details Lymphedema risk reduction practices and infection prevention   Person(s) Educated Patient   Methods Explanation;Handout   Comprehension Verbalized understanding                [facility] Goals - 11/15/15 [phone]    CC Long Term Goal  #1   Title Patient with verbalize an understanding of lymphedema risk reduction precautions   Status Achieved   CC Long Term Goal  #2   Title Patient will be independent in a home exercise program   Status Achieved   CC Long Term Goal  #4   Title Patient will improve right  shoulder abduction to 140 degrees so that she can return to  previous functional level    Baseline 10/22/15- pt is at 110 degrees, 11/01/15- 134 degrees, 146 degrees 11/09/15, 155 degrees 11/15/15   Status Achieved   CC Long Term Goal  #5   Title Patient will decrease the DASH score to < 20    to demonstrate increased functional use of upper extremity   Baseline Pt scored 2.27 on last visit 11/15/15   Status Achieved            Plan - 11/15/15 0842    Clinical Impression Statement Reviewed all HEP verbally  with pt and then did new standing strength  exercises issued last visit. Pt has met all her goals at this time and is ready to D/C.    Rehab Potential Excellent   Clinical Impairments Affecting Rehab Potential none   PT Frequency 2x / week   PT Duration 4 weeks   PT Treatment/Interventions ADLs/Self Care Home Management;Manual lymph drainage;Scar mobilization;Passive range of motion;Patient/family education;DME Instruction;Therapeutic activities;Taping;Manual techniques;Therapeutic exercise   PT Next Visit Plan D/C this visit   Consulted and Agree with Plan of Care Patient      Patient will benefit from skilled therapeutic intervention in order to improve the following deficits and impairments:  Increased fascial restricitons, Impaired UE functional use, Decreased knowledge of precautions, Decreased skin integrity, Pain, Decreased scar mobility, Decreased knowledge of use of DME, Decreased strength, Increased edema, Postural dysfunction  Visit Diagnosis: Stiffness of right shoulder, not elsewhere classified     Problem List Patient Active Problem List   Diagnosis Date Noted  . Gastric polyp 09/13/2015  . Breast cancer of upper-outer quadrant of right female breast ([place]) 08/26/2015  . Breast calcification, right 07/27/2015  . Inversion, nipple 07/27/2015  . Fatigue 07/22/2015  . Anterior scleritis of both eyes 05/12/2015  . Overweight (BMI 25.0-29.9) 05/12/2015  . Pre-diabetes 05/12/2015  . Essential hypertension, benign 09/05/2013  .  Plantar fasciitis 09/05/2013  . Health care maintenance 03/24/2013  . Epidermal cyst 06/17/2012  . Sciatica 05/17/2012  . Hepatic steatosis 01/02/2012  . Sweating 12/12/2011  . Calculus of bile duct without mention of cholecystitis or obstruction 12/05/2011  . Mastitis 10/19/2010  . Hypercholesterolemia with hypertriglyceridemia 02/24/2009  . HYPERGLYCEMIA 02/24/2009  . Asymptomatic postmenopausal status 02/23/2009  . COMMON MIGRAINE 01/09/2007  . HEMORRHOIDS, NOS 09/13/2006    SALISBURY,DONNA, PTA 11/15/2015, 9:24 PM  [place], [place], [address] Phone: [phone]   Fax:  [phone]  Name: SHEERA ILLINGWORTH MRN: [id number] Date of Birth: 1961/01/11    PHYSICAL THERAPY DISCHARGE SUMMARY  Visits from Start of Care: 13  Current functional level related to goals / functional outcomes: Goals met as noted above   Remaining deficits: Slight AROM limitations at right shoulder.   Education / Equipment: Home exercise program, self-care, lymphedema risk reduction. Plan: Patient agrees to discharge.  Patient goals were partially met. Patient is being discharged due to meeting the stated rehab goals.  ?????    Serafina Royals, PT 11/15/2015 9:24 PM

## 2015-11-15 NOTE — Telephone Encounter (Signed)
Appoinment scheduled 11-26-15

## 2015-11-17 ENCOUNTER — Ambulatory Visit: Payer: 59

## 2015-11-23 ENCOUNTER — Encounter: Payer: Self-pay | Admitting: Hematology and Oncology

## 2015-11-23 ENCOUNTER — Encounter: Payer: Self-pay | Admitting: *Deleted

## 2015-11-23 ENCOUNTER — Ambulatory Visit (HOSPITAL_BASED_OUTPATIENT_CLINIC_OR_DEPARTMENT_OTHER): Payer: 59 | Admitting: Hematology and Oncology

## 2015-11-23 ENCOUNTER — Other Ambulatory Visit: Payer: Self-pay | Admitting: *Deleted

## 2015-11-23 ENCOUNTER — Ambulatory Visit (HOSPITAL_BASED_OUTPATIENT_CLINIC_OR_DEPARTMENT_OTHER): Payer: 59

## 2015-11-23 ENCOUNTER — Telehealth: Payer: Self-pay | Admitting: Hematology and Oncology

## 2015-11-23 ENCOUNTER — Other Ambulatory Visit (HOSPITAL_BASED_OUTPATIENT_CLINIC_OR_DEPARTMENT_OTHER): Payer: 59

## 2015-11-23 VITALS — BP 137/85 | HR 85 | Temp 97.1°F | Resp 16

## 2015-11-23 DIAGNOSIS — C50411 Malignant neoplasm of upper-outer quadrant of right female breast: Secondary | ICD-10-CM

## 2015-11-23 DIAGNOSIS — Z5112 Encounter for antineoplastic immunotherapy: Secondary | ICD-10-CM | POA: Diagnosis not present

## 2015-11-23 LAB — CBC WITH DIFFERENTIAL/PLATELET
BASO%: 0.4 % (ref 0.0–2.0)
Basophils Absolute: 0 10*3/uL (ref 0.0–0.1)
EOS%: 1.4 % (ref 0.0–7.0)
Eosinophils Absolute: 0.1 10*3/uL (ref 0.0–0.5)
HCT: 37.1 % (ref 34.8–46.6)
HGB: 12.3 g/dL (ref 11.6–15.9)
LYMPH%: 32.1 % (ref 14.0–49.7)
MCH: 30 pg (ref 25.1–34.0)
MCHC: 33.2 g/dL (ref 31.5–36.0)
MCV: 90.2 fL (ref 79.5–101.0)
MONO#: 0.3 10*3/uL (ref 0.1–0.9)
MONO%: 5.8 % (ref 0.0–14.0)
NEUT%: 60.3 % (ref 38.4–76.8)
NEUTROS ABS: 3.2 10*3/uL (ref 1.5–6.5)
Platelets: 285 10*3/uL (ref 145–400)
RBC: 4.11 10*6/uL (ref 3.70–5.45)
RDW: 13.5 % (ref 11.2–14.5)
WBC: 5.3 10*3/uL (ref 3.9–10.3)
lymph#: 1.7 10*3/uL (ref 0.9–3.3)

## 2015-11-23 LAB — COMPREHENSIVE METABOLIC PANEL
ALBUMIN: 3.8 g/dL (ref 3.5–5.0)
ALK PHOS: 116 U/L (ref 40–150)
ALT: 36 U/L (ref 0–55)
ANION GAP: 10 meq/L (ref 3–11)
AST: 23 U/L (ref 5–34)
BUN: 18 mg/dL (ref 7.0–26.0)
CO2: 23 mEq/L (ref 22–29)
Calcium: 9.3 mg/dL (ref 8.4–10.4)
Chloride: 107 mEq/L (ref 98–109)
Creatinine: 0.7 mg/dL (ref 0.6–1.1)
GLUCOSE: 167 mg/dL — AB (ref 70–140)
POTASSIUM: 3.9 meq/L (ref 3.5–5.1)
Sodium: 141 mEq/L (ref 136–145)
Total Bilirubin: 0.38 mg/dL (ref 0.20–1.20)
Total Protein: 7.4 g/dL (ref 6.4–8.3)

## 2015-11-23 MED ORDER — ACETAMINOPHEN 325 MG PO TABS
650.0000 mg | ORAL_TABLET | Freq: Once | ORAL | Status: AC
  Administered 2015-11-23: 650 mg via ORAL

## 2015-11-23 MED ORDER — ACETAMINOPHEN 325 MG PO TABS
ORAL_TABLET | ORAL | Status: AC
  Filled 2015-11-23: qty 2

## 2015-11-23 MED ORDER — SODIUM CHLORIDE 0.9 % IV SOLN
Freq: Once | INTRAVENOUS | Status: AC
  Administered 2015-11-23: 09:00:00 via INTRAVENOUS

## 2015-11-23 MED ORDER — TRASTUZUMAB CHEMO INJECTION 440 MG
6.0000 mg/kg | Freq: Once | INTRAVENOUS | Status: AC
  Administered 2015-11-23: 399 mg via INTRAVENOUS
  Filled 2015-11-23: qty 19

## 2015-11-23 MED ORDER — ANASTROZOLE 1 MG PO TABS: 1.0000 mg | ORAL_TABLET | Freq: Every day | ORAL | Status: DC

## 2015-11-23 MED FILL — HYDROCHLOROTHIAZIDE 12.5 MG: 12.5 | 90 days supply | Qty: 90 | Fill #3

## 2015-11-23 MED FILL — ANASTROZOLE 1 MG TABLET: 1 | 90 days supply | Qty: 90 | Fill #0

## 2015-11-23 NOTE — Assessment & Plan Note (Signed)
Rt mastectomy 09/15/15: IDC Grade 2, 0.18 cm, with DCIS, 0/1 LN, ER 100%, PR 0%, HER2 Positive 4.52, [id number] (Stage 1A) Final tumor size: 0.35 cm on biopsy +0.18 cm on final lumpectomy equals 0.53 cm  Treatment plan: 1. shorter duration of anti-HER-2 therapy based upon [facility] trial where they used only 9 weeks of Herceptin treatment. Herceptin q 3 weeks 3 cycles.  2. Adjuvant antiestrogen therapy with anastrozole 1 mg daily  Echocardiogram 10/01/2015: EF 60-65%  Current treatment: Completed cycle 3 Herceptin today Anastrozole counseling:We discussed the risks and benefits of anti-estrogen therapy with aromatase inhibitors. These include but not limited to insomnia, hot flashes, mood changes, vaginal dryness, bone density loss, and weight gain. We strongly believe that the benefits far outweigh the risks. Patient understands these risks and consented to starting treatment. Planned treatment duration is 5 years.  Gastric biopsy: Revealed granulomatous inflammation suggestive of sarcoidosis versus Crohn's disease. Patient sees Dr. Paulita Fujita with gastroenterology.  Return to clinic in 3 months for toxicity check on anastrozole.

## 2015-11-23 NOTE — Telephone Encounter (Signed)
appt made and avs printed °

## 2015-11-23 NOTE — Progress Notes (Signed)
 Patient Care Team: Alexander J Karamalegos, DO as PCP - General (Osteopathic Medicine)   SUMMARY OF ONCOLOGIC HISTORY:   Breast cancer of upper-outer quadrant of right female breast (HCC)   08/10/2015 Breast MRI Enhancing mass in right breast upper outer quadrant 8 x 5 x 7 mm, no retroareolar right breast abnormality, no abnormality of the site of nipple retraction   08/18/2015 Initial Biopsy Right breast biopsy: Invasive ductal carcinoma with DCIS, grade 2 with focal necrosis,ER 100%, PR 0%, Ki-67 5%,HER-2 positive ratio 4.52   08/19/2015 Mammogram Right breast calcifications now seem to span 4 x 2 x 1.5 cm   09/15/2015 Surgery Rt mastectomy: IDC Grade 2, 0.18 cm, with DCIS, 0/1 LN, ER 100%, PR 0%, HER2 Positive 4.52, T1aN0M0 (Stage 1A)   10/12/2015 -  Chemotherapy Herceptin every 3 weeks 3 cycles (based on FINHER trial: Short herceptin protocol)    CHIEF COMPLIANT: cycle 3 Herceptin  INTERVAL HISTORY: Nichole Wilson is a 55-year-old with above-mentioned history right breast cancer treated with mastectomy and completed third cycle of Herceptin today. She is here to discuss the next steps in adjuvant treatment plan. She done very well from Herceptin without any major side effects or concerns.  REVIEW OF SYSTEMS:   Constitutional: Denies fevers, chills or abnormal weight loss Eyes: Denies blurriness of vision Ears, nose, mouth, throat, and face: Denies mucositis or sore throat Respiratory: Denies cough, dyspnea or wheezes Cardiovascular: Denies palpitation, chest discomfort Gastrointestinal:  Denies nausea, heartburn or change in bowel habits Skin: Denies abnormal skin rashes Lymphatics: Denies new lymphadenopathy or easy bruising Neurological:Denies numbness, tingling or new weaknesses Behavioral/Psych: Mood is stable, no new changes  Extremities: No lower extremity edema Breast:  denies any pain or lumps or nodules in either breasts All other systems were reviewed with the patient and  are negative.  I have reviewed the past medical history, past surgical history, social history and family history with the patient and they are unchanged from previous note.  ALLERGIES:  has No Known Allergies.  MEDICATIONS:  Current Outpatient Prescriptions  Medication Sig Dispense Refill  . acetaminophen (TYLENOL) 500 MG tablet Take 1,000 mg by mouth every 8 (eight) hours as needed for mild pain.    . anastrozole (ARIMIDEX) 1 MG tablet Take 1 tablet (1 mg total) by mouth daily. 90 tablet 3  . aspirin 81 MG tablet Take 81 mg by mouth daily.    . ciprofloxacin (CIPRO) 500 MG tablet Take 1 tablet (500 mg total) by mouth 2 (two) times daily. 10 tablet 0  . fenofibrate 160 MG tablet Take 160 mg by mouth daily.    . hydrochlorothiazide (HYDRODIURIL) 12.5 MG tablet Take 1 tablet (12.5 mg total) by mouth daily. 90 tablet 3  . HYDROcodone-acetaminophen (NORCO/VICODIN) 5-325 MG tablet Take 1-2 tablets by mouth every 6 (six) hours as needed for moderate pain or severe pain. (Patient not taking: Reported on 09/27/2015) 30 tablet 0  . Icosapent Ethyl 1 g CAPS Take 2 g by mouth 2 (two) times daily with a meal. 120 capsule 5  . metFORMIN (GLUCOPHAGE) 500 MG tablet Take 500 mg by mouth daily.    . Multiple Vitamin (MULTIVITAMIN WITH MINERALS) TABS Take 1 tablet by mouth daily.     No current facility-administered medications for this visit.    PHYSICAL EXAMINATION: ECOG PERFORMANCE STATUS: 0 - Asymptomatic  Filed Vitals:   11/23/15 1051  BP: 140/84  Pulse: 88  Temp: 97.8 F (36.6 C)  Resp: 18     Filed Weights   11/23/15 1051  Weight: 151 lb 6.4 oz (68.675 kg)    GENERAL:alert, no distress and comfortable SKIN: skin color, texture, turgor are normal, no rashes or significant lesions EYES: normal, Conjunctiva are pink and non-injected, sclera clear OROPHARYNX:no exudate, no erythema and lips, buccal mucosa, and tongue normal  NECK: supple, thyroid normal size, non-tender, without  nodularity LYMPH:  no palpable lymphadenopathy in the cervical, axillary or inguinal LUNGS: clear to auscultation and percussion with normal breathing effort HEART: regular rate & rhythm and no murmurs and no lower extremity edema ABDOMEN:abdomen soft, non-tender and normal bowel sounds MUSCULOSKELETAL:no cyanosis of digits and no clubbing  NEURO: alert & oriented x 3 with fluent speech, no focal motor/sensory deficits EXTREMITIES: No lower extremity edema  LABORATORY DATA:  I have reviewed the data as listed   Chemistry      Component Value Date/Time   NA 141 11/23/2015 0842   NA 143 09/14/2015 0932   K 3.9 11/23/2015 0842   K 3.9 09/14/2015 0932   CL 108 09/14/2015 0932   CO2 23 11/23/2015 0842   CO2 25 09/14/2015 0932   BUN 18.0 11/23/2015 0842   BUN 14 09/14/2015 0932   CREATININE 0.7 11/23/2015 0842   CREATININE 0.62 09/14/2015 0932   CREATININE 0.52 05/12/2015 1117      Component Value Date/Time   CALCIUM 9.3 11/23/2015 0842   CALCIUM 9.7 09/14/2015 0932   ALKPHOS 116 11/23/2015 0842   ALKPHOS 71 09/14/2015 0932   AST 23 11/23/2015 0842   AST 32 09/14/2015 0932   ALT 36 11/23/2015 0842   ALT 36 09/14/2015 0932   BILITOT 0.38 11/23/2015 0842   BILITOT 0.6 09/14/2015 0932       Lab Results  Component Value Date   WBC 5.3 11/23/2015   HGB 12.3 11/23/2015   HCT 37.1 11/23/2015   MCV 90.2 11/23/2015   PLT 285 11/23/2015   NEUTROABS 3.2 11/23/2015     ASSESSMENT & PLAN:  Breast cancer of upper-outer quadrant of right female breast (HCC) Rt mastectomy 09/15/15: IDC Grade 2, 0.18 cm, with DCIS, 0/1 LN, ER 100%, PR 0%, HER2 Positive 4.52, T1aN0M0 (Stage 1A) Final tumor size: 0.35 cm on biopsy +0.18 cm on final lumpectomy equals 0.53 cm  Treatment plan: 1. shorter duration of anti-HER-2 therapy based upon FINHER trial where they used only 9 weeks of Herceptin treatment. Herceptin q 3 weeks 3 cycles.  2. Adjuvant antiestrogen therapy with anastrozole 1 mg  daily  Echocardiogram 10/01/2015: EF 60-65%  Current treatment: Completed cycle 3 Herceptin today Anastrozole counseling:We discussed the risks and benefits of anti-estrogen therapy with aromatase inhibitors. These include but not limited to insomnia, hot flashes, mood changes, vaginal dryness, bone density loss, and weight gain. We strongly believe that the benefits far outweigh the risks. Patient understands these risks and consented to starting treatment. Planned treatment duration is 5 years.  Gastric biopsy: Revealed granulomatous inflammation suggestive of sarcoidosis versus Crohn's disease. Patient sees Dr. Outlaw with gastroenterology.  Return to clinic in 3 months for toxicity check on anastrozole.     No orders of the defined types were placed in this encounter.   The patient has a good understanding of the overall plan. she agrees with it. she will call with any problems that may develop before the next visit here.   Gudena, Vinay K, MD 11/23/2015      

## 2015-11-23 NOTE — Patient Instructions (Signed)
Trastuzumab injection for infusion What is this medicine? TRASTUZUMAB (tras TOO zoo mab) is a monoclonal antibody. It is used to treat breast cancer and stomach cancer. This medicine may be used for other purposes; ask your health care provider or pharmacist if you have questions. What should I tell my health care provider before I take this medicine? They need to know if you have any of these conditions: -heart disease -heart failure -infection (especially a virus infection such as chickenpox, cold sores, or herpes) -lung or breathing disease, like asthma -recent or ongoing radiation therapy -an unusual or allergic reaction to trastuzumab, benzyl alcohol, or other medications, foods, dyes, or preservatives -pregnant or trying to get pregnant -breast-feeding How should I use this medicine? This drug is given as an infusion into a vein. It is administered in a hospital or clinic by a specially trained health care professional. Talk to your pediatrician regarding the use of this medicine in children. This medicine is not approved for use in children. Overdosage: If you think you have taken too much of this medicine contact a poison control center or emergency room at once. NOTE: This medicine is only for you. Do not share this medicine with others. What if I miss a dose? It is important not to miss a dose. Call your doctor or health care professional if you are unable to keep an appointment. What may interact with this medicine? -doxorubicin -warfarin This list may not describe all possible interactions. Give your health care provider a list of all the medicines, herbs, non-prescription drugs, or dietary supplements you use. Also tell them if you smoke, drink alcohol, or use illegal drugs. Some items may interact with your medicine. What should I watch for while using this medicine? Visit your doctor for checks on your progress. Report any side effects. Continue your course of treatment even  though you feel ill unless your doctor tells you to stop. Call your doctor or health care professional for advice if you get a fever, chills or sore throat, or other symptoms of a cold or flu. Do not treat yourself. Try to avoid being around people who are sick. You may experience fever, chills and shaking during your first infusion. These effects are usually mild and can be treated with other medicines. Report any side effects during the infusion to your health care professional. Fever and chills usually do not happen with later infusions. Do not become pregnant while taking this medicine or for 7 months after stopping it. Women should inform their doctor if they wish to become pregnant or think they might be pregnant. Women of child-bearing potential will need to have a negative pregnancy test before starting this medicine. There is a potential for serious side effects to an unborn child. Talk to your health care professional or pharmacist for more information. Do not breast-feed an infant while taking this medicine or for 7 months after stopping it. Women must use effective birth control with this medicine. What side effects may I notice from receiving this medicine? Side effects that you should report to your doctor or other health care professional as soon as possible: -breathing difficulties -chest pain or palpitations -cough -dizziness or fainting -fever or chills, sore throat -skin rash, itching or hives -swelling of the legs or ankles -unusually weak or tired Side effects that usually do not require medical attention (report to your doctor or other health care professional if they continue or are bothersome): -loss of appetite -headache -muscle aches -nausea This   list may not describe all possible side effects. Call your doctor for medical advice about side effects. You may report side effects to FDA at 1-800-FDA-1088. Where should I keep my medicine? This drug is given in a hospital  or clinic and will not be stored at home. NOTE: This sheet is a summary. It may not cover all possible information. If you have questions about this medicine, talk to your doctor, pharmacist, or health care provider.    2016, [employer]/Gold Standard. (2014-10-09 11:49:32)   

## 2015-11-26 ENCOUNTER — Encounter: Payer: 59 | Admitting: Physical Therapy

## 2015-11-26 ENCOUNTER — Encounter: Payer: Self-pay | Admitting: Internal Medicine

## 2015-11-26 ENCOUNTER — Ambulatory Visit (INDEPENDENT_AMBULATORY_CARE_PROVIDER_SITE_OTHER): Payer: 59 | Admitting: Internal Medicine

## 2015-11-26 VITALS — BP 122/70 | HR 72 | Ht 62.0 in | Wt 149.0 lb

## 2015-11-26 DIAGNOSIS — K602 Anal fissure, unspecified: Secondary | ICD-10-CM | POA: Insufficient documentation

## 2015-11-26 DIAGNOSIS — K319 Disease of stomach and duodenum, unspecified: Secondary | ICD-10-CM | POA: Diagnosis not present

## 2015-11-26 DIAGNOSIS — K3189 Other diseases of stomach and duodenum: Secondary | ICD-10-CM

## 2015-11-26 DIAGNOSIS — C49A2 Gastrointestinal stromal tumor of stomach: Secondary | ICD-10-CM | POA: Diagnosis not present

## 2015-11-26 DIAGNOSIS — K760 Fatty (change of) liver, not elsewhere classified: Secondary | ICD-10-CM | POA: Diagnosis not present

## 2015-11-26 HISTORY — DX: Anal fissure, unspecified: K60.2

## 2015-11-26 MED ORDER — DILTIAZEM GEL 2 %: 1.0000 "application " | Freq: Three times a day (TID) | CUTANEOUS | Status: DC

## 2015-11-26 NOTE — Progress Notes (Signed)
Referred by: Olin Hauser, DO [place], [place] [address]  Subjective:    Patient ID: Nichole Wilson, female    DOB: 1961-03-19, 55 y.o.   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 She had a staging CT scan after a diagnosis of breast cancer - 09/10/15 -  1. 4.2 cm polypoid mass extending into the gastric lumen from the gastric cardia region. Faint calcification within the inferior portion of this polypoid mass. Differential diagnostic considerations include neoplastic polyp such as adenomatous polyp ; polypoid gastric adenocarcinoma ; non neoplastic polyps such as hyperplastic or hamartoma does polyps; or stimulators a polyp such as gastric carcinoid, GI stromal tumor, lymphoma, metastatic disease, or leiomyoma. Tissue diagnosis recommended.  Also had fatty liver changes  She then had an EGD and an EUS that showed same and EGD bxs showed granulomatous inflammation and FNA showed spindle cells and differential dx was leiomyoma vs bx of muscularis mucosa. She says she did not receive follow-up information and recommendations for treatment and then asked to see me for another opinion.   A lobulated intramural (subepithelial) lesion was found in the cardia of the stomach. It was encountered at 2 cm distal to the gastroesophageal junction. The lesion was heterogenous and had some cystic versus necrotic regions; some regions of focal hyperechoic nature, possibly small calcifications. Sonographically, the lesion appeared to originate from the submucosa (Layer 3). The lesion measured 25 mm x 27 mm in diameter.  She does have symtoms of early satiety but no pain.  She has had chronic intermittent rectal bleeding with pain ever since childbirth and has some bloating and gas sxs. She has  had a screening colonoscopy in past. No Known Allergies Outpatient Prescriptions Prior to Visit  Medication Sig Dispense Refill  . acetaminophen (TYLENOL) 500 MG tablet Take 1,000 mg by mouth every 8 (eight) hours as needed for mild pain.    [name] aspirin 81 MG tablet Take 81 mg by mouth daily.    . fenofibrate 160 MG tablet Take 160 mg by mouth daily.    . hydrochlorothiazide (HYDRODIURIL) 12.5 MG tablet Take 1 tablet (12.5 mg total) by mouth daily. 90 tablet 3  . Icosapent Ethyl 1 g CAPS Take 2 g by mouth 2 (two) times daily with a meal. 120 capsule 5  . metFORMIN (GLUCOPHAGE) 500 MG tablet Take 500 mg by mouth daily.    . Multiple Vitamin (MULTIVITAMIN WITH MINERALS) TABS Take 1 tablet by mouth daily.    [name] anastrozole (ARIMIDEX) 1 MG tablet Take 1 tablet (1 mg total) by mouth daily. 90 tablet 3  . ciprofloxacin (CIPRO) 500 MG tablet Take 1 tablet (500 mg total) by mouth 2 (two) times daily. 10 tablet 0  . HYDROcodone-acetaminophen (NORCO/VICODIN) 5-325 MG tablet Take 1-2 tablets by mouth every 6 (six) hours as needed for moderate pain or severe pain. (Patient not taking: Reported on 09/27/2015) 30 tablet 0   No facility-administered medications prior to visit.   Past Medical History  Diagnosis Date  . Hypertriglyceridemia   . Hepatic steatosis   . Breast cyst, left 01-04-12    cyst no longer apparent  . Migraine without aura, without mention of intractable migraine without mention of status migrainosus   . Hypercholesterolemia with hypertriglyceridemia   . Cholelithiasis with choledocholithiasis   .  Glucose intolerance (impaired glucose tolerance)     since at least 2011.   . Gallstone pancreatitis   . Hypertension 01-04-12    med d/c-last taken 12-07-11  -PT [place] STOPPED HER B/P MEDICATION BECAUSE HER B/P TOO LOW AND PT STATES HER B/P'S HAVE BEEN OK SINCE.  [name] Complication of anesthesia 2002    PT STATES DURING HER C-SECTION SHE WAS AWARE OF INCISION AND PAIN -THEN WAS  QUICKLY "PUT OUT"  . Chronic calculus cholecystitis 12/25/2011    Lap chole on 03/25/12   . PONV (postoperative nausea and vomiting)   . Pre-diabetes   . Cancer [facility])     right breast cancer   Past Surgical History  Procedure Laterality Date  . Cesarean section      2002  . Ercp  12/05/2011    Procedure: ENDOSCOPIC RETROGRADE CHOLANGIOPANCREATOGRAPHY (ERCP);  Surgeon: Inda Castle, MD;  Location: [facility];  Service: Endoscopy;  Laterality: N/A;  . Ercp  12/08/2011    Procedure: ENDOSCOPIC RETROGRADE CHOLANGIOPANCREATOGRAPHY (ERCP);  Surgeon: Inda Castle, MD;  Location: [facility];  Service: Gastroenterology;  Laterality: N/A;  . Cholecystectomy  03/25/2012    Procedure: LAPAROSCOPIC CHOLECYSTECTOMY WITH INTRAOPERATIVE CHOLANGIOGRAM;  Surgeon: Haywood Lasso, MD;  Location: [facility];  Service: General;  Laterality: N/A;  Laparoscopic Cholecystectomy with Intraoperative Cholangiogram  . Mastectomy w/ sentinel node biopsy Right 09/15/2015    Procedure: RIGHT MASTECTOMY WITH SENTINEL LYMPH NODE BIOPSY;  Surgeon: Rolm Bookbinder, MD;  Location: [facility];  Service: General;  Laterality: Right;  . Esophagogastroduodenoscopy (egd) with propofol N/A 09/28/2015    Procedure: ESOPHAGOGASTRODUODENOSCOPY (EGD) WITH PROPOFOL;  Surgeon: Clarene Essex, MD;  Location: [facility] END[facility];  Service: Endoscopy;  Laterality: N/A;  H AND P IN DRAWER  . Eus N/A 10/13/2015    Procedure: UPPER ENDOSCOPIC ULTRASOUND (EUS) RADIAL;  Surgeon: Arta Silence, MD;  Location: [facility];  Service: Endoscopy;  Laterality: N/A;   Social History   Social History  . Marital Status: Married    Spouse Name: N/A  . Number of Children: 2  . Years of Education: N/A   Occupational History  . 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    Social History Main Topics  . Smoking status: Never Smoker   . Smokeless tobacco: Never Used  . Alcohol Use: No  . Drug Use: No  . Sexual Activity: Yes    Other Topics Concern  . None   Social History Narrative   Married    - spanish interpreter   + children   No caffeine   Family History  Problem Relation Age of Onset  . Diabetes Mother   . Diabetes Father   . Diabetes Brother   . Colon cancer Neg Hx   . Heart disease Paternal Uncle   . Liver disease Brother    Review of Systems Doing well after excisional bx of breast cancer. No other problems now. All other ROS negative.    Objective:   Physical Exam  @BP  122/70 mmHg  Pulse 72  Ht 5\' 2"  (1.575 m)  Wt 149 lb (67.586 kg)  BMI 27.25 kg/m2  LMP 11/29/2011@  General:  Well-developed, well-nourished and in no acute distress Eyes:  anicteric. ENT:   Mouth and posterior pharynx free of lesions.  Neck:   supple w/o thyromegaly or mass.  Lungs: Clear to auscultation bilaterally. Heart:  S1S2, no rubs, murmurs, gallops. Abdomen:  soft, mildly tender  bilat lqtender, no hepatosplenomegaly, hernia, or mass and BS+.  Rectal: Female CMA chaperone  Posterior sentinel pile Some induration in anal canal post and tender Anoscopy was performed with the patient in the left lateral decubitus position while a chaperone was present and revealed posterior anal fissure Lymph:  no cervical or supraclavicular adenopathy. Extremities:   no edema, cyanosis or clubbing Skin   no rash. Neuro:  A&O x 3.  Psych:  appropriate mood and  Affect.   Data Reviewed:  EGD EUS CT scan and images (with patient) pathology CBC and CMET 5/9 - glucose 167 o/w NL May oncology note    Assessment & Plan:   Encounter Diagnoses  Name Primary?  . Gastric mass Yes  . Anal fissure - posterior   . Hepatic steatosis    Gastric mass seems most consistent with a GIST vs leiomyoma ? Next step - resection seems likely - probably needs surgical resection though I do ? If could be removed endoscopically (tertiary center) It should be removed. Could need a more definitive diagnosis prior to removal but  probably not  Will review at multidisciplinary GI cancer conference and determine next steps.  Will treat fissure w/ topical diltiazem 2% and local measures   I appreciate the opportunity to care for this patient. Cc"Alexander Atglen, DO Nicholas Lose, MD

## 2015-11-26 NOTE — Patient Instructions (Addendum)
  Today we are giving you information on Sitz baths and anal fissure to read and follow.   We have sent the following medications to your pharmacy for you to pick up at your convenience: Diltiazem cream to use 2-3 times a day.(faxed to [facility])    I appreciate the opportunity to care for you.

## 2015-11-29 ENCOUNTER — Encounter: Payer: 59 | Admitting: Physical Therapy

## 2015-12-01 ENCOUNTER — Encounter: Payer: Self-pay | Admitting: Internal Medicine

## 2015-12-01 DIAGNOSIS — K3189 Other diseases of stomach and duodenum: Secondary | ICD-10-CM

## 2015-12-01 HISTORY — DX: Other diseases of stomach and duodenum: K31.89

## 2015-12-03 ENCOUNTER — Encounter: Payer: Self-pay | Admitting: Hematology and Oncology

## 2015-12-03 NOTE — Progress Notes (Signed)
Fax done 09/29/15 I sent to medical records

## 2015-12-08 ENCOUNTER — Telehealth: Payer: Self-pay

## 2015-12-08 NOTE — Telephone Encounter (Signed)
Patient is scheduled with Dr. Barry Dienes for 12/14/15 3:45.   Left message for patient to call back

## 2015-12-08 NOTE — Telephone Encounter (Signed)
-----   Message from Gatha Mayer, MD sent at 12/08/2015  1:01 PM EDT ----- Regarding: appt Dr. Barry Dienes Please let her know that I reviewed her case with Drs at cancer conference and we think surgical removal makes sense  Dr. Chrissie Noa was contacted and he thinks she is best served by seeing Dr. Barry Dienes the surgical oncologist so please make a referral.

## 2015-12-09 ENCOUNTER — Encounter: Payer: 59 | Admitting: Physical Therapy

## 2015-12-09 NOTE — Telephone Encounter (Signed)
Patient notified of recommendations and office visit with Dr. Barry Dienes

## 2015-12-14 ENCOUNTER — Other Ambulatory Visit: Payer: Self-pay | Admitting: General Surgery

## 2015-12-14 DIAGNOSIS — K319 Disease of stomach and duodenum, unspecified: Secondary | ICD-10-CM | POA: Diagnosis not present

## 2015-12-20 MED FILL — metFORMIN HCL 500 MG TABS: 500 | 30 days supply | Qty: 60 | Fill #2

## 2015-12-24 DIAGNOSIS — H524 Presbyopia: Secondary | ICD-10-CM | POA: Diagnosis not present

## 2015-12-24 DIAGNOSIS — H52221 Regular astigmatism, right eye: Secondary | ICD-10-CM | POA: Diagnosis not present

## 2015-12-24 DIAGNOSIS — H5203 Hypermetropia, bilateral: Secondary | ICD-10-CM | POA: Diagnosis not present

## 2016-01-03 ENCOUNTER — Ambulatory Visit (HOSPITAL_COMMUNITY)
Admission: RE | Admit: 2016-01-03 | Discharge: 2016-01-03 | Disposition: A | Discharge status: Home / Self Care | Payer: 59 | Source: Ambulatory Visit | Attending: Family Medicine | Admitting: Family Medicine

## 2016-01-03 ENCOUNTER — Ambulatory Visit (HOSPITAL_BASED_OUTPATIENT_CLINIC_OR_DEPARTMENT_OTHER)
Admission: RE | Admit: 2016-01-03 | Discharge: 2016-01-03 | Disposition: A | Discharge status: Home / Self Care | Payer: 59 | Source: Ambulatory Visit | Attending: Cardiology | Admitting: Cardiology

## 2016-01-03 VITALS — BP 160/102 | HR 86 | Resp 18 | Wt 148.5 lb

## 2016-01-03 DIAGNOSIS — I10 Essential (primary) hypertension: Secondary | ICD-10-CM | POA: Insufficient documentation

## 2016-01-03 DIAGNOSIS — C50411 Malignant neoplasm of upper-outer quadrant of right female breast: Secondary | ICD-10-CM

## 2016-01-03 DIAGNOSIS — R7303 Prediabetes: Secondary | ICD-10-CM | POA: Diagnosis not present

## 2016-01-03 DIAGNOSIS — I313 Pericardial effusion (noninflammatory): Secondary | ICD-10-CM | POA: Diagnosis not present

## 2016-01-03 LAB — ECHOCARDIOGRAM COMPLETE
CHL CUP MV DEC (S): 173
CHL CUP TV REG PEAK VELOCITY: 233 cm/s
EERAT: 8.15
EWDT: 173 ms
FS: 47 % — AB (ref 28–44)
IV/PV OW: 1.03
LA ID, A-P, ES: 37 mm
LA diam index: 2.19 cm/m2
LA vol index: 19.6 mL/m2
LAVOL: 33.1 mL
LAVOLA4C: 22.6 mL
LEFT ATRIUM END SYS DIAM: 37 mm
LV E/e' medial: 8.15
LV E/e'average: 8.15
LVELAT: 8.81 cm/s
LVOT area: 2.84 cm2
LVOT diameter: 19 mm
MV Peak grad: 2 mmHg
MV pk E vel: 71.8 m/s
MVPKAVEL: 63.5 m/s
PW: 10.2 mm — AB (ref 0.6–1.1)
TAPSE: 14.6 mm
TDI e' lateral: 8.81
TDI e' medial: 5.44
TR max vel: 233 cm/s

## 2016-01-03 MED ORDER — LISINOPRIL 10 MG PO TABS: 10.0000 mg | ORAL_TABLET | Freq: Every day | ORAL | Status: DC

## 2016-01-03 NOTE — Patient Instructions (Signed)
Start Lisinopril 10mg  daily.  Lab work: 2 weeks (bmet)  Follow up with Dr.McLean as needed.

## 2016-01-03 NOTE — Progress Notes (Signed)
*  PRELIMINARY RESULTS* Echocardiogram 2D Echocardiogram has been performed.  Leavy Cella 01/03/2016, 1:43 PM

## 2016-01-03 NOTE — Pre-Procedure Instructions (Signed)
Nichole Wilson  01/03/2016      [facility] - [place], [place] - [address] [place] [address] Phone: [phone] Fax: [place], [address] [place] [address] Phone: [phone] Fax: [phone]  [facility] [place], [place] [address] Phone: [phone] Fax: [phone]  [place], [place] [address] Phone: [phone] Fax: [phone]    Your procedure is scheduled on Tuesday, June 27th, 2017.  Report to [facility] Admitting at 5:30 A.M.   Call this number if you have problems the morning of surgery:  [phone]   Remember:  Do not eat food or drink liquids after midnight.   Take these medicines the morning of surgery with A SIP OF WATER: Acetaminophen (Tylenol) if needed, Anastrozole (Arimidex).   WHAT DO I DO ABOUT MY DIABETES MEDICATION?  [name] Do not take oral diabetes medicines (pills) the morning of surgery. Do NOT take Metformin the morning of surgery.    5 days prior to surgery, stop taking: Aspirin, NSAIDS, Aleve, Naproxen, Ibuprofen, Advil, Motrin, BC's, Goody's, Fish oil, all herbal medications and all vitamins.    Do not wear jewelry, make-up or nail polish.  Do not wear lotions, powders, or perfumes.  You may NOT wear deoderant.  Do not shave 48 hours prior to surgery.   Do not bring valuables to the hospital.  [facility] is not responsible for any belongings or valuables.  Contacts, dentures or bridgework may not be worn into surgery.  Leave your suitcase in the car.  After surgery it may be brought to your room.  For patients admitted to the hospital, discharge time will be determined by your treatment team.  Patients discharged the  day of surgery will not be allowed to drive home.   Special instructions:  Preparing for Surgery.   Please read over the following fact sheets that you were given.   How to Manage Your Diabetes Before and After Surgery  Why is it important to control my blood sugar before and after surgery? . Improving blood sugar levels before and after surgery helps healing and can limit problems. . A way of improving blood sugar control is eating a healthy diet by: o  Eating less sugar and carbohydrates o  Increasing activity/exercise o  Talking with your doctor about reaching your blood sugar goals . High blood sugars (greater than 180 mg/dL) can raise your risk of infections and slow your recovery, so you will need to focus on controlling your diabetes during the weeks before surgery. . Make sure that the doctor who takes care of your diabetes knows about your planned surgery including the date and location.  How do I manage my blood sugar before surgery? . Check your blood sugar at least 4 times a day, starting 2 days before surgery, to make sure that the level is not too high or low. o Check your blood sugar the morning of your surgery when you wake up and every 2 hours until you get to the Short Stay unit. . If your blood sugar is less than 70 mg/dL, you will need to treat for low blood sugar: o Do not take insulin. o Treat a low blood sugar (less than 70 mg/dL) with  cup of clear juice (cranberry or apple), 4 glucose tablets, OR glucose gel. o Recheck blood sugar in 15 minutes after treatment (to make sure it is greater than 70 mg/dL). If your blood sugar is not greater than 70 mg/dL on recheck, call [phone] for further instructions. . Report your blood sugar to the short stay nurse when you get to Short Stay.  . If you are admitted to the hospital after surgery: o Your blood sugar will be checked by the staff and you will probably be given insulin after surgery (instead of oral diabetes  medicines) to make sure you have good blood sugar levels. o The goal for blood sugar control after surgery is 80-180 mg/dL.    [facility]- Preparing For Surgery  Before surgery, you can play an important role. Because skin is not sterile, your skin needs to be as free of germs as possible. You can reduce the number of germs on your skin by washing with CHG (chlorahexidine gluconate) Soap before surgery.  CHG is an antiseptic cleaner which kills germs and bonds with the skin to continue killing germs even after washing.  Please do not use if you have an allergy to CHG or antibacterial soaps. If your skin becomes reddened/irritated stop using the CHG.  Do not shave (including legs and underarms) for at least 48 hours prior to first CHG shower. It is OK to shave your face.  Please follow these instructions carefully.   1. Shower the NIGHT BEFORE SURGERY and the MORNING OF SURGERY with CHG.   2. If you chose to wash your hair, wash your hair first as usual with your normal shampoo.  3. After you shampoo, rinse your hair and body thoroughly to remove the shampoo.  4. Use CHG as you would any other liquid soap. You can apply CHG directly to the skin and wash gently with a scrungie or a clean washcloth.   5. Apply the CHG Soap to your body ONLY FROM THE NECK DOWN.  Do not use on open wounds or open sores. Avoid contact with your eyes, ears, mouth and genitals (private parts). Wash genitals (private parts) with your normal soap.  6. Wash thoroughly, paying special attention to the area where your surgery will be performed.  7. Thoroughly rinse your body with warm water from the neck down.  8. DO NOT shower/wash with your normal soap after using and rinsing off the CHG Soap.  9. Pat yourself dry with a CLEAN TOWEL.   10. Wear CLEAN PAJAMAS   11. Place CLEAN SHEETS on your bed the night of your first shower and DO NOT SLEEP WITH PETS.  Day of Surgery: Do not apply any deodorants/lotions.  Please wear clean clothes to the hospital/surgery center.

## 2016-01-04 ENCOUNTER — Encounter (HOSPITAL_COMMUNITY): Payer: Self-pay

## 2016-01-04 ENCOUNTER — Encounter (HOSPITAL_COMMUNITY)
Admission: RE | Admit: 2016-01-04 | Discharge: 2016-01-04 | Disposition: A | Discharge status: Home / Self Care | Payer: 59 | Source: Ambulatory Visit | Attending: General Surgery | Admitting: General Surgery

## 2016-01-04 DIAGNOSIS — I119 Hypertensive heart disease without heart failure: Secondary | ICD-10-CM

## 2016-01-04 DIAGNOSIS — K319 Disease of stomach and duodenum, unspecified: Secondary | ICD-10-CM | POA: Insufficient documentation

## 2016-01-04 DIAGNOSIS — I10 Essential (primary) hypertension: Secondary | ICD-10-CM | POA: Insufficient documentation

## 2016-01-04 DIAGNOSIS — Z01812 Encounter for preprocedural laboratory examination: Secondary | ICD-10-CM | POA: Diagnosis not present

## 2016-01-04 HISTORY — DX: Family history of other specified conditions: Z84.89

## 2016-01-04 HISTORY — DX: Hypertensive heart disease without heart failure: I11.9

## 2016-01-04 HISTORY — DX: Essential (primary) hypertension: I10

## 2016-01-04 LAB — BASIC METABOLIC PANEL
Anion gap: 11 (ref 5–15)
BUN: 10 mg/dL (ref 6–20)
CALCIUM: 9.6 mg/dL (ref 8.9–10.3)
CO2: 23 mmol/L (ref 22–32)
CREATININE: 0.57 mg/dL (ref 0.44–1.00)
Chloride: 104 mmol/L (ref 101–111)
GFR calc Af Amer: >60 mL/min (ref >60)
GFR calc non Af Amer: >60 mL/min (ref >60)
GLUCOSE: 163 mg/dL — AB (ref 65–99)
Potassium: 3.4 mmol/L — ABNORMAL LOW (ref 3.5–5.1)
SODIUM: 138 mmol/L (ref 135–145)

## 2016-01-04 LAB — CBC
HCT: 38.8 % (ref 36.0–46.0)
Hemoglobin: 12.5 g/dL (ref 12.0–15.0)
MCH: 29.1 pg (ref 26.0–34.0)
MCHC: 32.2 g/dL (ref 30.0–36.0)
MCV: 90.2 fL (ref 78.0–100.0)
PLATELETS: 267 10*3/uL (ref 150–400)
RBC: 4.3 MIL/uL (ref 3.87–5.11)
RDW: 13.7 % (ref 11.5–15.5)
WBC: 4.6 10*3/uL (ref 4.0–10.5)

## 2016-01-04 LAB — GLUCOSE, CAPILLARY: GLUCOSE-CAPILLARY: 181 mg/dL — AB (ref 65–99)

## 2016-01-04 MED ORDER — LISINOPRIL 10 MG PO TABS: 10.0000 mg | ORAL_TABLET | Freq: Every day | ORAL | Status: DC

## 2016-01-04 MED FILL — LISINOPRIL 10 MG TABLET: 10 | 90 days supply | Qty: 90 | Fill #0

## 2016-01-04 NOTE — Progress Notes (Signed)
Patient ID: Nichole Wilson, female   DOB: October 02, 1960, 55 y.o.   MRN: [id number] Oncologist: Dr Lindi Adie  55 yo with history of hyperlipidemia, HTN, and DM2 developed breast cancer.  She is now s/p right mastectomy in 3/17, ER+/PR-/HER2+ tumor.  She has now completed a shortened Herceptin course.  She has no history of heart problems.  No chest pain or exertional dyspnea.  BP is high today.   Of note, she has been diagnosed with a gastric mass => GIST versus leiomyoma.   PMH: 1. Hyperlipidemia 2. Gallstone pancreatitis: s/p cholecystectomy. 3. HTN 4. Type II diabetes 5. Breast cancer: Right mastectomy in 3/17, ER+/PR-/HER2+ tumor.  She has now completed Herceptin course.   - Echo (3/17) with EF 60-65%, mild LVH, normal diastolic function, lateral s' 11.8, GLS -16.8% (strain did not track well towards apex).  - Echo (6/17) with EF 60-65%, grade II diastolic dysfunction, normal RV size and systolic function, lateral s' 11.0 cm/sec, GLS -20.9%.  6. Gastric mass: GIST versus leiomyoma.   SH: Married, nonsmoker, no ETOH.  Family History  Problem Relation Age of Onset  . Diabetes Mother   . Diabetes Father   . Diabetes Brother   . Colon cancer Neg Hx   . Heart disease Paternal Uncle   . Liver disease Brother    ROS: All systems reviewed and negative except as per HPI.   Current Outpatient Prescriptions  Medication Sig Dispense Refill  . acetaminophen (TYLENOL) 500 MG tablet Take 1,000 mg by mouth every 8 (eight) hours as needed for mild pain.    [name] anastrozole (ARIMIDEX) 1 MG tablet Take 1 mg by mouth daily.    [name] aspirin 81 MG tablet Take 81 mg by mouth daily.    [name] diltiazem 2 % GEL Apply 1 application topically 3 (three) times daily. 30 g 4  . hydrochlorothiazide (HYDRODIURIL) 12.5 MG tablet Take 1 tablet (12.5 mg total) by mouth daily. 90 tablet 3  . Icosapent Ethyl 1 g CAPS Take 2 g by mouth 2 (two) times daily with a meal. 120 capsule 5  . metFORMIN (GLUCOPHAGE) 500 MG tablet Take 500 mg  by mouth daily.    [name] lisinopril (PRINIVIL) 10 MG tablet Take 1 tablet (10 mg total) by mouth daily. 30 tablet 3   No current facility-administered medications for this encounter.   BP 160/102 mmHg  Pulse 86  Resp 18  Wt 148 lb 8 oz (67.359 kg)  SpO2 98%  LMP 11/29/2011 General: NAD Neck: No JVD, no thyromegaly or thyroid nodule.  Lungs: Clear to auscultation bilaterally with normal respiratory effort. CV: Nondisplaced PMI.  Heart regular S1/S2, no S3/S4, no murmur.  No peripheral edema.  No carotid bruit.  Normal pedal pulses.  Abdomen: Soft, nontender, no hepatosplenomegaly, no distention.  Skin: Intact without lesions or rashes.  Neurologic: Alert and oriented x 3.  Psych: Normal affect. Extremities: No clubbing or cyanosis.  HEENT: Normal.   Assessment/Plan: 1. Breast cancer: She has completed her Herceptin course.  I reviewed her echo done in the office today.  EF and strain remain stable.  No evidence for cardiotoxicity.  She does not need any further screening echoes as she has completed Herceptin. 2. HTN: BP has been running high recently.  I will add lisinopril 10 mg daily to her regimen with BMET in 2 wks.   She can followup prn.   Loralie Champagne 01/04/2016

## 2016-01-04 NOTE — Progress Notes (Signed)
PCP - Dr. Nobie Putnam Cardiologist - Dr. Aundra Dubin  EKG - 09/14/15 CXR - denies  Echo - 01/03/16 Stress test/cardiac cath - denies  Patient denies chest pain and shortness of breath at PAT appointment.    Patient states that she does not check her blood sugars at home but will try to find her machine and start checking them prior to surgery.

## 2016-01-05 LAB — HEMOGLOBIN A1C
Hgb A1c MFr Bld: 6.5 % — ABNORMAL HIGH (ref 4.8–5.6)
Mean Plasma Glucose: 140 mg/dL

## 2016-01-10 ENCOUNTER — Encounter (HOSPITAL_COMMUNITY): Payer: Self-pay | Admitting: Anesthesiology

## 2016-01-10 NOTE — Anesthesia Preprocedure Evaluation (Addendum)
Anesthesia Evaluation  Patient identified by MRN, date of birth, ID band Patient awake    Reviewed: Allergy & Precautions, NPO status , Patient's Chart, lab work & pertinent test results  History of Anesthesia Complications (+) PONV and history of anesthetic complications  Airway Mallampati: II  TM Distance: >3 FB Neck ROM: Full    Dental  (+) Partial Upper, Caps, Dental Advisory Given   Pulmonary neg pulmonary ROS,    Pulmonary exam normal breath sounds clear to auscultation       Cardiovascular hypertension, Pt. on medications Normal cardiovascular exam Rhythm:Regular Rate:Normal     Neuro/Psych  Headaches,  Neuromuscular disease negative psych ROS   GI/Hepatic Hepatic steatosis Gastric mass   Endo/Other  diabetes, Well Controlled, Type 2, Oral Hypoglycemic AgentsHyperlipidemia   Renal/GU negative Renal ROS  negative genitourinary   Musculoskeletal Sciatica   Abdominal   Peds  Hematology negative hematology ROS (+)   Anesthesia Other Findings   Reproductive/Obstetrics negative OB ROS                           Anesthesia Physical Anesthesia Plan  ASA: II  Anesthesia Plan: General   Post-op Pain Management:    Induction: Intravenous  Airway Management Planned: Oral ETT  Additional Equipment:   Intra-op Plan:   Post-operative Plan: Extubation in OR  Informed Consent: I have reviewed the patients History and Physical, chart, labs and discussed the procedure including the risks, benefits and alternatives for the proposed anesthesia with the patient or authorized representative who has indicated his/her understanding and acceptance.   Dental advisory given  Plan Discussed with: CRNA, Anesthesiologist and Surgeon  Anesthesia Plan Comments:         Anesthesia Quick Evaluation

## 2016-01-11 ENCOUNTER — Inpatient Hospital Stay (HOSPITAL_COMMUNITY): Payer: 59 | Admitting: Certified Registered Nurse Anesthetist

## 2016-01-11 ENCOUNTER — Inpatient Hospital Stay (HOSPITAL_COMMUNITY)
Admission: RE | Admit: 2016-01-11 | Discharge: 2016-01-14 | DRG: 566 | Disposition: A | Discharge status: Home / Self Care | Payer: 59 | Source: Ambulatory Visit | Attending: General Surgery | Admitting: General Surgery

## 2016-01-11 ENCOUNTER — Encounter (HOSPITAL_COMMUNITY): Payer: Self-pay | Admitting: *Deleted

## 2016-01-11 ENCOUNTER — Encounter (HOSPITAL_COMMUNITY)
Admission: RE | Disposition: A | Discharge status: Home / Self Care | Payer: Self-pay | Source: Ambulatory Visit | Attending: General Surgery

## 2016-01-11 DIAGNOSIS — Z9011 Acquired absence of right breast and nipple: Secondary | ICD-10-CM | POA: Diagnosis not present

## 2016-01-11 DIAGNOSIS — I10 Essential (primary) hypertension: Secondary | ICD-10-CM | POA: Diagnosis present

## 2016-01-11 DIAGNOSIS — C50411 Malignant neoplasm of upper-outer quadrant of right female breast: Secondary | ICD-10-CM | POA: Diagnosis present

## 2016-01-11 DIAGNOSIS — Z7984 Long term (current) use of oral hypoglycemic drugs: Secondary | ICD-10-CM | POA: Diagnosis not present

## 2016-01-11 DIAGNOSIS — D214 Benign neoplasm of connective and other soft tissue of abdomen: Secondary | ICD-10-CM | POA: Diagnosis not present

## 2016-01-11 DIAGNOSIS — Z8249 Family history of ischemic heart disease and other diseases of the circulatory system: Secondary | ICD-10-CM

## 2016-01-11 DIAGNOSIS — K3189 Other diseases of stomach and duodenum: Secondary | ICD-10-CM | POA: Diagnosis not present

## 2016-01-11 DIAGNOSIS — Z79811 Long term (current) use of aromatase inhibitors: Secondary | ICD-10-CM

## 2016-01-11 DIAGNOSIS — K319 Disease of stomach and duodenum, unspecified: Secondary | ICD-10-CM | POA: Diagnosis present

## 2016-01-11 DIAGNOSIS — E119 Type 2 diabetes mellitus without complications: Secondary | ICD-10-CM | POA: Diagnosis present

## 2016-01-11 DIAGNOSIS — Z23 Encounter for immunization: Secondary | ICD-10-CM | POA: Diagnosis not present

## 2016-01-11 DIAGNOSIS — D481 Neoplasm of uncertain behavior of connective and other soft tissue: Secondary | ICD-10-CM | POA: Diagnosis not present

## 2016-01-11 HISTORY — PX: UPPER GI ENDOSCOPY: SHX6162

## 2016-01-11 HISTORY — PX: LAPAROSCOPIC PARTIAL GASTRECTOMY: SHX5908

## 2016-01-11 LAB — GLUCOSE, CAPILLARY
GLUCOSE-CAPILLARY: 173 mg/dL — AB (ref 65–99)
Glucose-Capillary: 113 mg/dL — ABNORMAL HIGH (ref 65–99)
Glucose-Capillary: 174 mg/dL — ABNORMAL HIGH (ref 65–99)
Glucose-Capillary: 124 mg/dL — ABNORMAL HIGH (ref 65–99)
Glucose-Capillary: 192 mg/dL — ABNORMAL HIGH (ref 65–99)

## 2016-01-11 SURGERY — LAPAROSCOPIC PARTIAL GASTRECTOMY
Anesthesia: General | Site: Abdomen

## 2016-01-11 MED ORDER — ONDANSETRON HCL 4 MG/2ML IJ SOLN
INTRAMUSCULAR | Status: AC
  Filled 2016-01-11: qty 6

## 2016-01-11 MED ORDER — HYDROMORPHONE HCL 1 MG/ML IJ SOLN
INTRAMUSCULAR | Status: AC
  Administered 2016-01-11: 0.25 mg via INTRAVENOUS
  Filled 2016-01-11: qty 1

## 2016-01-11 MED ORDER — SCOPOLAMINE 1 MG/3DAYS TD PT72
1.0000 | MEDICATED_PATCH | TRANSDERMAL | Status: DC
  Administered 2016-01-11: 1.5 mg via TRANSDERMAL

## 2016-01-11 MED ORDER — DEXAMETHASONE SODIUM PHOSPHATE 10 MG/ML IJ SOLN
INTRAMUSCULAR | Status: AC
  Filled 2016-01-11: qty 1

## 2016-01-11 MED ORDER — PHENYLEPHRINE HCL 10 MG/ML IJ SOLN
INTRAMUSCULAR | Status: AC
  Filled 2016-01-11: qty 1

## 2016-01-11 MED ORDER — DEXAMETHASONE SODIUM PHOSPHATE 10 MG/ML IJ SOLN
INTRAMUSCULAR | Status: DC | PRN
  Administered 2016-01-11: 5 mg via INTRAVENOUS

## 2016-01-11 MED ORDER — DIPHENHYDRAMINE HCL 50 MG/ML IJ SOLN: 12.5000 mg | Freq: Four times a day (QID) | INTRAMUSCULAR | Status: DC | PRN

## 2016-01-11 MED ORDER — MIDAZOLAM HCL 5 MG/5ML IJ SOLN
INTRAMUSCULAR | Status: DC | PRN
Start: 2016-01-11 — End: 2016-01-11
  Administered 2016-01-11: 2 mg via INTRAVENOUS

## 2016-01-11 MED ORDER — PHENOL 1.4 % MT LIQD
1.0000 | OROMUCOSAL | Status: DC | PRN
  Administered 2016-01-11 (×2): 1 via OROMUCOSAL
  Filled 2016-01-11: qty 177

## 2016-01-11 MED ORDER — LIDOCAINE 2% (20 MG/ML) 5 ML SYRINGE
INTRAMUSCULAR | Status: AC
  Filled 2016-01-11: qty 10

## 2016-01-11 MED ORDER — DILTIAZEM GEL 2 %
1.0000 "application " | Freq: Three times a day (TID) | CUTANEOUS | Status: DC
  Administered 2016-01-12: 1 via TOPICAL

## 2016-01-11 MED ORDER — MIDAZOLAM HCL 2 MG/2ML IJ SOLN
INTRAMUSCULAR | Status: AC
  Filled 2016-01-11: qty 2

## 2016-01-11 MED ORDER — PROPOFOL 10 MG/ML IV BOLUS
INTRAVENOUS | Status: AC
  Filled 2016-01-11: qty 40

## 2016-01-11 MED ORDER — HYDROMORPHONE HCL 1 MG/ML IJ SOLN
0.2500 mg | INTRAMUSCULAR | Status: DC | PRN
  Administered 2016-01-11 (×2): 0.25 mg via INTRAVENOUS

## 2016-01-11 MED ORDER — ROCURONIUM BROMIDE 10 MG/ML (PF) SYRINGE
PREFILLED_SYRINGE | INTRAVENOUS | Status: DC | PRN
  Administered 2016-01-11 (×4): 10 mg via INTRAVENOUS
  Administered 2016-01-11: 50 mg via INTRAVENOUS
  Administered 2016-01-11: 10 mg via INTRAVENOUS

## 2016-01-11 MED ORDER — PROMETHAZINE HCL 25 MG/ML IJ SOLN: 6.2500 mg | INTRAMUSCULAR | Status: DC | PRN

## 2016-01-11 MED ORDER — LIDOCAINE 2% (20 MG/ML) 5 ML SYRINGE
INTRAMUSCULAR | Status: DC | PRN
  Administered 2016-01-11: 80 mg via INTRAVENOUS

## 2016-01-11 MED ORDER — PHENYLEPHRINE HCL 10 MG/ML IJ SOLN
10.0000 mg | INTRAMUSCULAR | Status: DC | PRN
  Administered 2016-01-11: 25 ug/min via INTRAVENOUS

## 2016-01-11 MED ORDER — LIDOCAINE HCL (PF) 1 % IJ SOLN
INTRAMUSCULAR | Status: AC
  Filled 2016-01-11: qty 30

## 2016-01-11 MED ORDER — BUPIVACAINE-EPINEPHRINE (PF) 0.25% -1:200000 IJ SOLN
INTRAMUSCULAR | Status: AC
  Filled 2016-01-11: qty 30

## 2016-01-11 MED ORDER — DIPHENHYDRAMINE HCL 12.5 MG/5ML PO ELIX: 12.5000 mg | ORAL_SOLUTION | Freq: Four times a day (QID) | ORAL | Status: DC | PRN

## 2016-01-11 MED ORDER — PROPOFOL 10 MG/ML IV BOLUS
INTRAVENOUS | Status: DC | PRN
  Administered 2016-01-11: 20 mg via INTRAVENOUS
  Administered 2016-01-11: 170 mg via INTRAVENOUS

## 2016-01-11 MED ORDER — SUGAMMADEX SODIUM 200 MG/2ML IV SOLN
INTRAVENOUS | Status: DC | PRN
  Administered 2016-01-11: 200 mg via INTRAVENOUS

## 2016-01-11 MED ORDER — FENTANYL CITRATE (PF) 100 MCG/2ML IJ SOLN
INTRAMUSCULAR | Status: DC | PRN
  Administered 2016-01-11: 50 ug via INTRAVENOUS
  Administered 2016-01-11: 100 ug via INTRAVENOUS
  Administered 2016-01-11: 75 ug via INTRAVENOUS
  Administered 2016-01-11: 25 ug via INTRAVENOUS
  Administered 2016-01-11: 50 ug via INTRAVENOUS

## 2016-01-11 MED ORDER — KCL IN DEXTROSE-NACL 20-5-0.45 MEQ/L-%-% IV SOLN
INTRAVENOUS | Status: DC
  Administered 2016-01-11 – 2016-01-13 (×6): via INTRAVENOUS
  Filled 2016-01-11 (×6): qty 1000

## 2016-01-11 MED ORDER — ACETAMINOPHEN 650 MG RE SUPP: 650.0000 mg | Freq: Four times a day (QID) | RECTAL | Status: DC | PRN

## 2016-01-11 MED ORDER — KETOROLAC TROMETHAMINE 30 MG/ML IJ SOLN
30.0000 mg | Freq: Four times a day (QID) | INTRAMUSCULAR | Status: DC | PRN
  Administered 2016-01-12 – 2016-01-13 (×4): 30 mg via INTRAVENOUS
  Filled 2016-01-11 (×4): qty 1

## 2016-01-11 MED ORDER — ONDANSETRON 4 MG PO TBDP: 4.0000 mg | ORAL_TABLET | Freq: Four times a day (QID) | ORAL | Status: DC | PRN

## 2016-01-11 MED ORDER — INSULIN ASPART 100 UNIT/ML ~~LOC~~ SOLN
0.0000 [IU] | Freq: Three times a day (TID) | SUBCUTANEOUS | Status: DC
  Administered 2016-01-11: 2 [IU] via SUBCUTANEOUS
  Administered 2016-01-12 (×2): 1 [IU] via SUBCUTANEOUS
  Administered 2016-01-12 – 2016-01-13 (×2): 2 [IU] via SUBCUTANEOUS
  Administered 2016-01-13: 1 [IU] via SUBCUTANEOUS

## 2016-01-11 MED ORDER — ROCURONIUM BROMIDE 50 MG/5ML IV SOLN
INTRAVENOUS | Status: AC
  Filled 2016-01-11: qty 3

## 2016-01-11 MED ORDER — FENTANYL CITRATE (PF) 250 MCG/5ML IJ SOLN
INTRAMUSCULAR | Status: AC
  Filled 2016-01-11: qty 5

## 2016-01-11 MED ORDER — KETOROLAC TROMETHAMINE 30 MG/ML IJ SOLN
30.0000 mg | Freq: Four times a day (QID) | INTRAMUSCULAR | Status: AC
  Administered 2016-01-11 – 2016-01-12 (×4): 30 mg via INTRAVENOUS
  Filled 2016-01-11 (×4): qty 1

## 2016-01-11 MED ORDER — WHITE PETROLATUM GEL
Status: AC
  Administered 2016-01-11: 0.2
  Filled 2016-01-11: qty 1

## 2016-01-11 MED ORDER — LIDOCAINE HCL 1 % IJ SOLN
INTRAMUSCULAR | Status: DC | PRN
  Administered 2016-01-11: 10:00:00

## 2016-01-11 MED ORDER — ONDANSETRON HCL 4 MG/2ML IJ SOLN: 4.0000 mg | Freq: Four times a day (QID) | INTRAMUSCULAR | Status: DC | PRN

## 2016-01-11 MED ORDER — CEFAZOLIN SODIUM-DEXTROSE 2-4 GM/100ML-% IV SOLN
2.0000 g | Freq: Three times a day (TID) | INTRAVENOUS | Status: AC
  Administered 2016-01-11: 2 g via INTRAVENOUS
  Filled 2016-01-11: qty 100

## 2016-01-11 MED ORDER — PHENYLEPHRINE 40 MCG/ML (10ML) SYRINGE FOR IV PUSH (FOR BLOOD PRESSURE SUPPORT)
PREFILLED_SYRINGE | INTRAVENOUS | Status: AC
  Filled 2016-01-11: qty 20

## 2016-01-11 MED ORDER — CEFAZOLIN SODIUM-DEXTROSE 2-4 GM/100ML-% IV SOLN
2.0000 g | INTRAVENOUS | Status: AC
  Administered 2016-01-11: 2 g via INTRAVENOUS
  Filled 2016-01-11: qty 100

## 2016-01-11 MED ORDER — PHENYLEPHRINE HCL 10 MG/ML IJ SOLN
INTRAMUSCULAR | Status: DC | PRN
  Administered 2016-01-11 (×2): 40 ug via INTRAVENOUS
  Administered 2016-01-11: 120 ug via INTRAVENOUS

## 2016-01-11 MED ORDER — LACTATED RINGERS IV SOLN
INTRAVENOUS | Status: DC | PRN
  Administered 2016-01-11 (×2): via INTRAVENOUS

## 2016-01-11 MED ORDER — ACETAMINOPHEN 325 MG PO TABS: 650.0000 mg | ORAL_TABLET | Freq: Four times a day (QID) | ORAL | Status: DC | PRN

## 2016-01-11 MED ORDER — MEPERIDINE HCL 25 MG/ML IJ SOLN: 6.2500 mg | INTRAMUSCULAR | Status: DC | PRN

## 2016-01-11 MED ORDER — SODIUM CHLORIDE 0.9 % IR SOLN
Status: DC | PRN
  Administered 2016-01-11: 1

## 2016-01-11 MED ORDER — ONDANSETRON HCL 4 MG/2ML IJ SOLN
INTRAMUSCULAR | Status: DC | PRN
  Administered 2016-01-11: 4 mg via INTRAVENOUS

## 2016-01-11 MED ORDER — FAMOTIDINE IN NACL 20-0.9 MG/50ML-% IV SOLN
20.0000 mg | Freq: Two times a day (BID) | INTRAVENOUS | Status: DC
  Administered 2016-01-11 – 2016-01-13 (×6): 20 mg via INTRAVENOUS
  Filled 2016-01-11 (×7): qty 50

## 2016-01-11 MED ORDER — EPHEDRINE 5 MG/ML INJ
INTRAVENOUS | Status: AC
  Filled 2016-01-11: qty 10

## 2016-01-11 MED ORDER — HYDRALAZINE HCL 20 MG/ML IJ SOLN: 10.0000 mg | INTRAMUSCULAR | Status: DC | PRN

## 2016-01-11 MED ORDER — PHENYLEPHRINE 40 MCG/ML (10ML) SYRINGE FOR IV PUSH (FOR BLOOD PRESSURE SUPPORT)
PREFILLED_SYRINGE | INTRAVENOUS | Status: DC | PRN
  Administered 2016-01-11: 80 ug via INTRAVENOUS

## 2016-01-11 MED ORDER — PNEUMOCOCCAL VAC POLYVALENT 25 MCG/0.5ML IJ INJ
0.5000 mL | INJECTION | INTRAMUSCULAR | Status: AC
  Administered 2016-01-12: 0.5 mL via INTRAMUSCULAR
  Filled 2016-01-11: qty 0.5

## 2016-01-11 MED ORDER — SCOPOLAMINE 1 MG/3DAYS TD PT72
MEDICATED_PATCH | TRANSDERMAL | Status: AC
  Filled 2016-01-11: qty 1

## 2016-01-11 SURGICAL SUPPLY — 79 items
ADH SKN CLS APL DERMABOND .7 (GAUZE/BANDAGES/DRESSINGS) ×2
APL SRG 32X5 SNPLK LF DISP (MISCELLANEOUS)
BAG SPEC RTRVL LRG 6X4 10 (ENDOMECHANICALS) ×2
BLADE SURG ROTATE 9660 (MISCELLANEOUS) IMPLANT
CANISTER SUCTION 2500CC (MISCELLANEOUS) ×3 IMPLANT
CHLORAPREP W/TINT 26ML (MISCELLANEOUS) ×3 IMPLANT
COVER SURGICAL LIGHT HANDLE (MISCELLANEOUS) ×3 IMPLANT
DERMABOND ADVANCED (GAUZE/BANDAGES/DRESSINGS) ×1
DERMABOND ADVANCED .7 DNX12 (GAUZE/BANDAGES/DRESSINGS) IMPLANT
DRAIN CHANNEL 19F RND (DRAIN) IMPLANT
DRAIN PENROSE 1/2X36 STERILE (WOUND CARE) IMPLANT
DRAPE UTILITY XL STRL (DRAPES) ×6 IMPLANT
DRAPE WARM FLUID 44X44 (DRAPE) ×3 IMPLANT
DRSG COVADERM 4X10 (GAUZE/BANDAGES/DRESSINGS) IMPLANT
DRSG COVADERM 4X14 (GAUZE/BANDAGES/DRESSINGS) IMPLANT
ELECT BLADE 6.5 EXT (BLADE) IMPLANT
ELECT CAUTERY BLADE 6.4 (BLADE) IMPLANT
ELECT REM PT RETURN 9FT ADLT (ELECTROSURGICAL) ×3
ELECTRODE REM PT RTRN 9FT ADLT (ELECTROSURGICAL) ×2 IMPLANT
EVACUATOR SILICONE 100CC (DRAIN) IMPLANT
GAUZE SPONGE 4X4 12PLY STRL (GAUZE/BANDAGES/DRESSINGS) IMPLANT
GLOVE BIO SURGEON STRL SZ 6 (GLOVE) ×3 IMPLANT
GLOVE BIO SURGEON STRL SZ7 (GLOVE) ×3 IMPLANT
GLOVE BIO SURGEON STRL SZ7.5 (GLOVE) ×2 IMPLANT
GLOVE BIOGEL PI IND STRL 6.5 (GLOVE) ×2 IMPLANT
GLOVE BIOGEL PI IND STRL 7.0 (GLOVE) IMPLANT
GLOVE BIOGEL PI IND STRL 7.5 (GLOVE) IMPLANT
GLOVE BIOGEL PI INDICATOR 6.5 (GLOVE) ×1
GLOVE BIOGEL PI INDICATOR 7.0 (GLOVE) ×1
GLOVE BIOGEL PI INDICATOR 7.5 (GLOVE) ×3
GLOVE ECLIPSE 7.0 STRL STRAW (GLOVE) ×1 IMPLANT
GOWN STRL REUS W/ TWL LRG LVL3 (GOWN DISPOSABLE) ×6 IMPLANT
GOWN STRL REUS W/TWL 2XL LVL3 (GOWN DISPOSABLE) ×3 IMPLANT
GOWN STRL REUS W/TWL LRG LVL3 (GOWN DISPOSABLE) ×24
HEMOSTAT SURGICEL 2X14 (HEMOSTASIS) IMPLANT
KIT BASIN OR (CUSTOM PROCEDURE TRAY) ×3 IMPLANT
KIT ROOM TURNOVER OR (KITS) ×3 IMPLANT
LOOP VESSEL MAXI BLUE (MISCELLANEOUS) IMPLANT
NS IRRIG 1000ML POUR BTL (IV SOLUTION) ×6 IMPLANT
PAD ARMBOARD 7.5X6 YLW CONV (MISCELLANEOUS) ×6 IMPLANT
PENCIL BUTTON HOLSTER BLD 10FT (ELECTRODE) IMPLANT
POUCH SPECIMEN RETRIEVAL 10MM (ENDOMECHANICALS) ×3 IMPLANT
RELOAD BLUE (STAPLE) IMPLANT
RELOAD STAPLE 60 3.6 BLU REG (STAPLE) IMPLANT
RELOAD STAPLER BLUE 60MM (STAPLE) ×12 IMPLANT
RELOAD WHITE ECR60W (STAPLE) IMPLANT
SCALPEL HARMONIC ACE (MISCELLANEOUS) ×3 IMPLANT
SEALANT SURGICAL APPL DUAL CAN (MISCELLANEOUS) IMPLANT
SET IRRIG TUBING LAPAROSCOPIC (IRRIGATION / IRRIGATOR) ×1 IMPLANT
SLEEVE ENDOPATH XCEL 5M (ENDOMECHANICALS) ×9 IMPLANT
STAPLE ECHEON FLEX 60 POW ENDO (STAPLE) ×1 IMPLANT
STAPLER RELOAD BLUE 60MM (STAPLE) ×18
STAPLER STANDARD HANDLE (STAPLE) IMPLANT
STAPLER VISISTAT 35W (STAPLE) IMPLANT
SUT ETHILON 2 0 FS 18 (SUTURE) IMPLANT
SUT MNCRL AB 4-0 PS2 18 (SUTURE) ×4 IMPLANT
SUT PDS AB 1 TP1 96 (SUTURE) IMPLANT
SUT PDS II 0 TP-1 LOOPED 60 (SUTURE) IMPLANT
SUT SILK 2 0 SH CR/8 (SUTURE) ×3 IMPLANT
SUT SILK 2 0 TIES 10X30 (SUTURE) ×3 IMPLANT
SUT SILK 3 0 SH CR/8 (SUTURE) ×3 IMPLANT
SUT SILK 3 0 TIES 10X30 (SUTURE) ×3 IMPLANT
SYS LAPSCP GELPORT 120MM (MISCELLANEOUS)
SYSTEM LAPSCP GELPORT 120MM (MISCELLANEOUS) IMPLANT
TIP INNERVISION DETACH 40FR (MISCELLANEOUS) IMPLANT
TIP INNERVISION DETACH 50FR (MISCELLANEOUS) ×1 IMPLANT
TIP INNERVISION DETACH 56FR (MISCELLANEOUS) IMPLANT
TIPS INNERVISION DETACH 40FR (MISCELLANEOUS)
TOWEL OR 17X24 6PK STRL BLUE (TOWEL DISPOSABLE) ×3 IMPLANT
TOWEL OR 17X26 10 PK STRL BLUE (TOWEL DISPOSABLE) ×3 IMPLANT
TRAY FOLEY CATH 14FRSI W/METER (CATHETERS) ×3 IMPLANT
TRAY LAPAROSCOPIC MC (CUSTOM PROCEDURE TRAY) ×3 IMPLANT
TROCAR BLADELESS 12MM (ENDOMECHANICALS) ×1 IMPLANT
TROCAR XCEL 12X100 BLDLESS (ENDOMECHANICALS) ×1 IMPLANT
TROCAR XCEL BLUNT TIP 100MML (ENDOMECHANICALS) IMPLANT
TROCAR XCEL NON-BLD 5MMX100MML (ENDOMECHANICALS) ×3 IMPLANT
TUBE CONNECTING 12X1/4 (SUCTIONS) IMPLANT
TUBING FILTER THERMOFLATOR (ELECTROSURGICAL) ×3 IMPLANT
YANKAUER SUCT BULB TIP NO VENT (SUCTIONS) IMPLANT

## 2016-01-11 NOTE — H&P (Signed)
Nichole Wilson. [name] 12/14/2015 4:06 PM Location: [place] Office Patient #: [id number] DOB: 04/02/1961 Married / Language: Nichole Wilson / Race: White Female   History of Present Illness [name] MD; 01/09/2016 3:49 PM) Patient words: gastric mass.  The patient is a 55 year old female who presents with an abdominal mass. Patient is a breast cancer patient of Dr. [name] who is referred for consultation regarding a gastric mass by Dr. [name]. CT was performed as part of her staging workup for breast cancer. Polypoid mass was seen at the gastric cardia. She underwent EUS in March of this year, but does not feel that much was recommended for this. She has now completed therapy and would like to get this evaluated. She denies abdominal pain, but does have some early satiety. She has not had any weight loss. 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   09/10/15 - 1. 4.2 cm polypoid mass extending into the gastric lumen from the gastric cardia region. Faint calcification within the inferior portion of this polypoid mass. Differential diagnostic considerations include neoplastic polyp such as adenomatous polyp ; polypoid gastric adenocarcinoma ; non neoplastic polyps such as hyperplastic or hamartoma does polyps; or stimulators a polyp such as gastric carcinoid, GI stromal tumor, lymphoma, metastatic disease, or leiomyoma. Tissue diagnosis recommended.  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   Problem List/Past Medical Nichole Wilson, [place]; 12/14/2015 4:07 PM) POSTOPERATIVE STATE [id number]) doing well, released to full activity, rx for prosthetic given today, I can see her back in december. BREAST CANCER OF UPPER-OUTER QUADRANT OF RIGHT FEMALE BREAST (C50.411) Right total mastectomy, right axillary sentinel node biopsy We discussed a sentinel lymph node biopsy as she does not appear to having lymph node involvement right now. We discussed the performance of  that with injection of radioactive tracer. We discussed that there is a chance of having a positive node with a sentinel lymph node biopsy and we will await the permanent pathology to make any other first further decisions in terms of her treatment. We discussed up to a 5% risk lifetime of chronic shoulder pain as well as lymphedema associated with a sentinel lymph node biopsy. We discussed the options for treatment of the breast cancer which included lumpectomy versus a mastectomy. We also discussed that she will likely need radiation therapy if she undergoes lumpectomy. We discussed the mastectomy (removal of whole breast) and the postoperative care for that as well. Mastectomy can be followed by reconstruction. This is a more extensive surgery and requires more recovery. The decision for lumpectomy vs mastectomy has no impact on decision for chemotherapy. Most mastectomy patients will not need radiation therapy. We discussed that there is no difference in her survival whether she undergoes lumpectomy with radiation therapy or antiestrogen therapy versus a mastectomy. There is also no real difference between her recurrence in the breast. I think this larger area will require mastectomy. she does not want reconstruction or consideration of nsm. We discussed the risks of operation including bleeding, infection, possible reoperation. She understands her further therapy will be based on what her stages at the time of her operation. we talked a long time today about bilateral and I told her there was no benefit to preventive mastectomy for her. she has agreed to proceed with only right side.  Other Problems Nichole Wilson, [place]; 12/14/2015 4:07 PM) High blood pressure  Past Surgical History Nichole Wilson, [place]; 12/14/2015 4:07 PM) Cesarean Section - 1 Gallbladder Surgery - Laparoscopic  Diagnostic Studies History Nichole Wilson, [place]; 12/14/2015 4:07 PM) Mammogram 1-3 years ago  Allergies  Nichole Wilson, [place]; 12/14/2015 4:07 PM) No Known Drug Allergies02/02/2016  Medication History Nichole Wilson, [place]; 12/14/2015 4:08 PM) MetFORMIN HCl (500MG  Tablet, Oral daily) Active. Pravastatin Sodium (40MG  Tablet, Oral daily) Active. Fenofibrate (160MG  Tablet, Oral daily) Active. Vitamin C (500MG  Tablet, Oral daily) Active. Vitamin E (1000UNIT Capsule, Oral daily) Active. Multivitamin (Oral daily) Active. Illusions AA Breast Prosthesis (1 (one)) Active. [phone] Post-mastectomy camisole - to hold drain tubes and wear during healing.; QTY: 2; Length of use: 3 Months No Refills ; L8000 Post-surgical bras - to hold breast prosthesis ; QTY: 6; Length of use:3 Months 3 Refills ; Replacement: Compression, healing; [id number]-silicone breast prosthesis - for temporary use during healing or for comfort at end of day, lighter weight, cooler for hot weather.; QTY: 1 for single, 2 for bilateral ; Length of use: 6 months Up to 1 refill; Replacement ; [id number] Silicone Breast Prosthesis - to restore balance and symmetry after breast surgery; QTY: 1 for single, 2 for Bilateral; Length of use: 2 years No refills) Anastrozole (1MG  Tablet, Oral) Active. Medications Reconciled  Social History Nichole Wilson, [place]; 12/14/2015 4:07 PM) Alcohol use Occasional alcohol use. No caffeine use No drug use Tobacco use Never smoker.  Family History Nichole Wilson, [place]; 12/14/2015 4:07 PM) Hypertension Mother.  Pregnancy / Birth History Nichole Wilson, [place]; 12/14/2015 4:07 PM) Age at menarche 54 years. Age of menopause <47 Gravida 2 Maternal age 109-35 Para 2    Review of Systems [name] MD; 01/09/2016 3:49 PM) All other systems negative  Vitals [name] CMA; 12/14/2015 4:06 PM) 12/14/2015 4:06 PM Weight: 149.5 lb Height: 62in Body Surface Area: 1.69 m Body Mass Index: 27.34 kg/m  Temp.: 98.92F(Oral)  BP: 112/76 (Sitting, Left Arm,  Standard)       Physical Exam [name] MD; 01/09/2016 3:49 PM) General Mental Status-Alert. General Appearance-Consistent with stated age. Hydration-Well hydrated. Voice-Normal.  Head and Neck Head-normocephalic, atraumatic with no lesions or palpable masses.  Eye Sclera/Conjunctiva - Bilateral-No scleral icterus.  Chest and Lung Exam Chest and lung exam reveals -quiet, even and easy respiratory effort with no use of accessory muscles. Inspection Chest Wall - Normal. Back - normal.  Cardiovascular Cardiovascular examination reveals -normal pedal pulses bilaterally. Note: regular rate and rhythm  Abdomen Inspection-Inspection Normal. Palpation/Percussion Palpation and Percussion of the abdomen reveal - Soft, Non Tender, No Rebound tenderness, No Rigidity (guarding) and No hepatosplenomegaly.  Peripheral Vascular Upper Extremity Inspection - Bilateral - Normal - No Clubbing, No Cyanosis, No Edema, Pulses Intact. Lower Extremity Palpation - Edema - Bilateral - No edema.  Neurologic Neurologic evaluation reveals -alert and oriented x 3 with no impairment of recent or remote memory. Mental Status-Normal.  Musculoskeletal Global Assessment -Note: no gross deformities.  Normal Exam - Left-Upper Extremity Strength Normal and Lower Extremity Strength Normal. Normal Exam - Right-Upper Extremity Strength Normal and Lower Extremity Strength Normal.  Lymphatic Head & Neck  General Head & Neck Lymphatics: Bilateral - Description - Normal. Axillary  General Axillary Region: Bilateral - Description - Normal. Tenderness - Non Tender.    Assessment & Plan [name] MD; 01/09/2016 3:53 PM) GASTRIC MASS (K31.9) Impression: This is a rather proximal gastric mass. I do think it is worthwhile to attempt to remove laparoscopically. It looks to be on the greater curve. I discussed with the patient laparoscopic removal vs hand assisted  laparoscopic removal.  I discussed post op care including around 2-3 days in the hospital followed by no lifting or strenuous activity for 4-8 weeks. I discussed that at least one incision would need to be large enough to remove the specimen through.  I do not think she needs any additional imaging. I reviewed risk of bleeding, staple line leak, heart or lung complications, possible prolonged post op nausea, change in appetite and eating patterns that would likely be temporary but may be long term.  40 min spent in evaluation, examination, counseling, and coordination of care. >50% spent in counseling. Current Plans You are being scheduled for surgery - Our schedulers will call you.  You should hear from our office's scheduling department within 5 working days about the location, date, and time of surgery. We try to make accommodations for patient's preferences in scheduling surgery, but sometimes the OR schedule or the surgeon's schedule prevents [place] from making those accommodations.  If you have not heard from our office [phone]) in 5 working days, call the office and ask for your surgeon's nurse.  If you have other questions about your diagnosis, plan, or surgery, call the office and ask for your surgeon's nurse.  Pt Education - [facility] Free Text Education/Instructions: discussed with patient and provided information.   Signed by [name], MD (01/09/2016 3:54 PM)

## 2016-01-11 NOTE — Anesthesia Procedure Notes (Signed)
Procedure Name: Intubation Date/Time: 01/11/2016 8:44 AM Performed by: Merdis Delay Pre-anesthesia Checklist: Patient identified, Emergency Drugs available, Patient being monitored, Timeout performed and Suction available Patient Re-evaluated:Patient Re-evaluated prior to inductionOxygen Delivery Method: Circle system utilized Preoxygenation: Pre-oxygenation with 100% oxygen Intubation Type: IV induction Ventilation: Mask ventilation without difficulty Laryngoscope Size: Mac and 3 Grade View: Grade II Tube type: Oral Tube size: 7.0 mm Number of attempts: 1 Placement Confirmation: ETT inserted through vocal cords under direct vision,  positive ETCO2,  CO2 detector and breath sounds checked- equal and bilateral Secured at: 22 cm Tube secured with: Tape Dental Injury: Teeth and Oropharynx as per pre-operative assessment  Comments: Very tight mouth opening after induction

## 2016-01-11 NOTE — Transfer of Care (Signed)
Immediate Anesthesia Transfer of Care Note  Patient: Nichole Wilson  Procedure(s) Performed: Procedure(s): LAPAROSCOPIC PARTIAL GASTRECTOMY (N/A) UPPER GI ENDOSCOPY (N/A)  Patient Location: PACU  Anesthesia Type:General  Level of Consciousness: awake, alert  and oriented  Airway & Oxygen Therapy: Patient Spontanous Breathing and Patient connected to nasal cannula oxygen  Post-op Assessment: Report given to RN and Post -op Vital signs reviewed and stable  Post vital signs: Reviewed and stable  Last Vitals:  Filed Vitals:   01/11/16 0637  BP: 120/87  Pulse: 93  Temp: 36.7 C  Resp: 18    Last Pain: There were no vitals filed for this visit.       Complications: No apparent anesthesia complications

## 2016-01-11 NOTE — Anesthesia Postprocedure Evaluation (Signed)
Anesthesia Post Note  Patient: LUREE WINSLOW  Procedure(s) Performed: Procedure(s) (LRB): LAPAROSCOPIC PARTIAL GASTRECTOMY (N/A) UPPER GI ENDOSCOPY (N/A)  Patient location during evaluation: PACU Anesthesia Type: General Level of consciousness: awake and alert and oriented Pain management: pain level controlled Vital Signs Assessment: post-procedure vital signs reviewed and stable Respiratory status: spontaneous breathing, nonlabored ventilation, respiratory function stable and patient connected to nasal cannula oxygen Cardiovascular status: blood pressure returned to baseline and stable Postop Assessment: no signs of nausea or vomiting Anesthetic complications: no    Last Vitals:  Filed Vitals:   01/11/16 1035 01/11/16 1050  BP: 135/91 134/88  Pulse: 104 103  Temp:    Resp: 14 13    Last Pain:  Filed Vitals:   01/11/16 1054  PainSc: 3                  Emrah Ariola A.

## 2016-01-11 NOTE — Interval H&P Note (Signed)
History and Physical Interval Note:  01/11/2016 7:27 AM  Nichole Wilson  has presented today for surgery, with the diagnosis of gastric mass  The various methods of treatment have been discussed with the patient and family. After consideration of risks, benefits and other options for treatment, the patient has consented to  Procedure(s): LAPAROSCOPIC PARTIAL GASTRECTOMY (N/A) UPPER GI ENDOSCOPY (N/A) as a surgical intervention .  The patient's history has been reviewed, patient examined, no change in status, stable for surgery.  I have reviewed the patient's chart and labs.  Questions were answered to the patient's satisfaction.     Stanely Sexson

## 2016-01-11 NOTE — Op Note (Signed)
PRE-OPERATIVE DIAGNOSIS: GIST proximal stomach  POST-OPERATIVE DIAGNOSIS:  Same  PROCEDURE:  Procedure(s): Laparoscopic partial gastrectomy  SURGEON:  Surgeon(s): [name], MD  ASSISTANT:   Serita Grammes, MD  ANESTHESIA:   local and general  DRAINS: none   LOCAL MEDICATIONS USED:  BUPIVICAINE  and LIDOCAINE   SPECIMEN:  Source of Specimen:  stomach mass  DISPOSITION OF SPECIMEN:  PATHOLOGY  COUNTS:  YES  PLAN OF CARE: Admit to inpatient   PATIENT DISPOSITION:  PACU - hemodynamically stable.   FINDINGS:  Rubbery mass in proximal stomach approximately 4.5 cm.  PROCEDURE:  Pt was identified in the holding area and taken to the operating room and placed supine on the operating room table.  General anesthesia was induced.  A foley catheter was placed.  The abdomen was prepped and draped in sterile fashion.  Timeout was performed according to the surgical safety checklist.  When all was correct, we continued.    The patient was placed in reverse Trendelenberg position and rotated to the left.  A 5 mm Optiview trocar was placed at the left costal margin under direct visualization.  Two 5 mm trocars were placed in the upper midline as well, and one in the left upper quadrant.   The short gastric arteries were taken down with the harmonic scalpel.  Care was taken to avoid the spleen.  The stomach was mobilized up the greater curve to the GE junction.  The lesser curve was mobilized as well.  The mass was visible.  A 5 mm port was placed in the subxiphoid position and the articulating liver retractor was used to hold the left lateral segment up.  The supraumbilical port was upsized to a 12 mm port.  The 50 Fr lighted Bougie retractor was placed by Dr. [name].  There was adequate diameter of the esophagus to staple off the mass.  Two fires of the Echelon stapler were used to divide part of the cardia of the stomach with the mass from the stomach. However, this was a bad angle to get  the rest of the mass.  A second 12 mm port was placed in the LUQ.  Additional loads were used to complete the stapled removal.   There was no bleeding at the staple line.  The mass was retrieved with an endocatch bag via the 12 mm supraumbilical port.  This was opened and examined to make sure the gross margin was negative.    The left upper quadrant was reexamined for hemostasis.  There was no evidence of bleeding.  A 4 quadrant inspection was performed and was negative for signs of succus or blood.  The supraumbilical 12 mm port was closed with two 0 vicryl figure of 8 suture.  There was no air leakage from the port.  The remaining ports were removed and pneumoperitoneum was allowed to evacuate.  The skin of all the incisions was closed with 4-0 Monocryl in subcuticular fashion.  The wounds were cleaned, dried, and dressed with dermabond. Needle, sponge, and instrument counts were correct.  The patient was awakened from anesthesia and taken to the PACU in stable condition.

## 2016-01-12 ENCOUNTER — Encounter (HOSPITAL_COMMUNITY): Payer: Self-pay | Admitting: General Surgery

## 2016-01-12 LAB — BASIC METABOLIC PANEL
Anion gap: 6 (ref 5–15)
BUN: 7 mg/dL (ref 6–20)
CHLORIDE: 107 mmol/L (ref 101–111)
CO2: 24 mmol/L (ref 22–32)
CREATININE: 0.55 mg/dL (ref 0.44–1.00)
Calcium: 8.8 mg/dL — ABNORMAL LOW (ref 8.9–10.3)
GFR calc non Af Amer: >60 mL/min (ref >60)
GLUCOSE: 140 mg/dL — AB (ref 65–99)
Potassium: 4 mmol/L (ref 3.5–5.1)
Sodium: 137 mmol/L (ref 135–145)

## 2016-01-12 LAB — GLUCOSE, CAPILLARY
GLUCOSE-CAPILLARY: 144 mg/dL — AB (ref 65–99)
Glucose-Capillary: 155 mg/dL — ABNORMAL HIGH (ref 65–99)
Glucose-Capillary: 143 mg/dL — ABNORMAL HIGH (ref 65–99)
Glucose-Capillary: 128 mg/dL — ABNORMAL HIGH (ref 65–99)

## 2016-01-12 LAB — CBC
HEMATOCRIT: 32.9 % — AB (ref 36.0–46.0)
HEMOGLOBIN: 11.1 g/dL — AB (ref 12.0–15.0)
MCH: 30.1 pg (ref 26.0–34.0)
MCHC: 33.7 g/dL (ref 30.0–36.0)
MCV: 89.2 fL (ref 78.0–100.0)
Platelets: 256 10*3/uL (ref 150–400)
RBC: 3.69 MIL/uL — ABNORMAL LOW (ref 3.87–5.11)
RDW: 13.9 % (ref 11.5–15.5)
WBC: 8.1 10*3/uL (ref 4.0–10.5)

## 2016-01-12 NOTE — Progress Notes (Signed)
1 Day Post-Op  Subjective: No n/v.  Soreness and hoarseness.  Pain controlled.    Objective: Vital signs in last 24 hours: Temp:  [97.8 F (36.6 C)-98.9 F (37.2 C)] 98.9 F (37.2 C) (06/28 0554) Pulse Rate:  [93-111] 93 (06/28 0554) Resp:  [13-24] 22 (06/27 1135) BP: (118-136)/(74-91) 118/76 mmHg (06/28 0554) SpO2:  [98 %-100 %] 98 % (06/28 0554)    Intake/Output from previous day: 06/27 0701 - 06/28 0700 In: 3356.7 [I.V.:3126.7; NG/GT:30; IV Piggyback:200] Out: 2700 [Urine:2400; Emesis/NG output:200; Blood:100] Intake/Output this shift:    General appearance: alert, cooperative and no distress Resp: breathing comfortably GI: soft, non distended, approp tender.  some bruising at incisions.  Lab Results:   Recent Labs  01/12/16 0418  WBC 8.1  HGB 11.1*  HCT 32.9*  PLT 256   BMET  Recent Labs  01/12/16 0418  NA 137  K 4.0  CL 107  CO2 24  GLUCOSE 140*  BUN 7  CREATININE 0.55  CALCIUM 8.8*   PT/INR No results for input(s): LABPROT, INR in the last 72 hours. ABG No results for input(s): PHART, HCO3 in the last 72 hours.  Invalid input(s): PCO2, PO2  Studies/Results: No results found.  Anti-infectives: Anti-infectives    Start     Dose/Rate Route Frequency Ordered Stop   01/11/16 1600  ceFAZolin (ANCEF) IVPB 2g/100 mL premix     2 g 200 mL/hr over 30 Minutes Intravenous Every 8 hours 01/11/16 1142 01/11/16 1801   01/11/16 0531  ceFAZolin (ANCEF) IVPB 2g/100 mL premix     2 g 200 mL/hr over 30 Minutes Intravenous On call to O.R. 01/11/16 0531 01/11/16 0753      Assessment/Plan: s/p Procedure(s): LAPAROSCOPIC PARTIAL GASTRECTOMY (N/A) UPPER GI ENDOSCOPY (N/A) d/c foley  D/c NGT later today. Sparing amounts of clears today. Ambulate.   LOS: 1 day    [facility] 01/12/2016

## 2016-01-13 LAB — BASIC METABOLIC PANEL
Anion gap: 6 (ref 5–15)
BUN: 8 mg/dL (ref 6–20)
CALCIUM: 8.7 mg/dL — AB (ref 8.9–10.3)
CO2: 25 mmol/L (ref 22–32)
CREATININE: 0.51 mg/dL (ref 0.44–1.00)
Chloride: 108 mmol/L (ref 101–111)
Glucose, Bld: 119 mg/dL — ABNORMAL HIGH (ref 65–99)
Potassium: 3.9 mmol/L (ref 3.5–5.1)
SODIUM: 139 mmol/L (ref 135–145)

## 2016-01-13 LAB — GLUCOSE, CAPILLARY
GLUCOSE-CAPILLARY: 141 mg/dL — AB (ref 65–99)
GLUCOSE-CAPILLARY: 110 mg/dL — AB (ref 65–99)
GLUCOSE-CAPILLARY: 147 mg/dL — AB (ref 65–99)
Glucose-Capillary: 169 mg/dL — ABNORMAL HIGH (ref 65–99)

## 2016-01-13 LAB — CBC
HCT: 32.1 % — ABNORMAL LOW (ref 36.0–46.0)
Hemoglobin: 10.4 g/dL — ABNORMAL LOW (ref 12.0–15.0)
MCH: 30.1 pg (ref 26.0–34.0)
MCHC: 32.4 g/dL (ref 30.0–36.0)
MCV: 92.8 fL (ref 78.0–100.0)
Platelets: 230 10*3/uL (ref 150–400)
RBC: 3.46 MIL/uL — ABNORMAL LOW (ref 3.87–5.11)
RDW: 14.4 % (ref 11.5–15.5)
WBC: 6.9 10*3/uL (ref 4.0–10.5)

## 2016-01-13 MED ORDER — BISACODYL 5 MG PO TBEC
10.0000 mg | DELAYED_RELEASE_TABLET | Freq: Every day | ORAL | Status: DC | PRN
  Administered 2016-01-13: 10 mg via ORAL
  Filled 2016-01-13: qty 2

## 2016-01-13 MED ORDER — OXYCODONE HCL 5 MG PO TABS: 5.0000 mg | ORAL_TABLET | ORAL | Status: DC | PRN

## 2016-01-13 NOTE — Progress Notes (Signed)
Patient ID: MACALA PROKOSCH, female   DOB: 02-23-1961, 55 y.o.   MRN: [id number] 2 Days Post-Op  Subjective: Doing better.  Having flatus.  No n/v.  Getting up and around.    Objective: Vital signs in last 24 hours: Temp:  [98.3 F (36.8 C)-98.4 F (36.9 C)] 98.3 F (36.8 C) (06/29 0521) Pulse Rate:  [80-88] 80 (06/29 0521) Resp:  [18-19] 18 (06/29 0521) BP: (116-125)/(66-76) 116/76 mmHg (06/29 0521) SpO2:  [95 %-98 %] 98 % (06/29 0521) Last BM Date: 01/11/16  Intake/Output from previous day: 06/28 0701 - 06/29 0700 In: 1320 [P.O.:120; I.V.:1100; NG/GT:100] Out: 2400 [Urine:2300; Emesis/NG output:100] Intake/Output this shift: Total I/O In: 120 [P.O.:120] Out: 500 [Urine:500]  General appearance: alert, cooperative and no distress Resp: breathing comfortably GI: soft, non distended, approp tender.  some bruising at incisions.  Lab Results:   Recent Labs  01/12/16 0418 01/13/16 0544  WBC 8.1 6.9  HGB 11.1* 10.4*  HCT 32.9* 32.1*  PLT 256 230   BMET  Recent Labs  01/12/16 0418 01/13/16 0544  NA 137 139  K 4.0 3.9  CL 107 108  CO2 24 25  GLUCOSE 140* 119*  BUN 7 8  CREATININE 0.55 0.51  CALCIUM 8.8* 8.7*   PT/INR No results for input(s): LABPROT, INR in the last 72 hours. ABG No results for input(s): PHART, HCO3 in the last 72 hours.  Invalid input(s): PCO2, PO2  Studies/Results: No results found.  Anti-infectives: Anti-infectives    Start     Dose/Rate Route Frequency Ordered Stop   01/11/16 1600  ceFAZolin (ANCEF) IVPB 2g/100 mL premix     2 g 200 mL/hr over 30 Minutes Intravenous Every 8 hours 01/11/16 1142 01/11/16 1801   01/11/16 0531  ceFAZolin (ANCEF) IVPB 2g/100 mL premix     2 g 200 mL/hr over 30 Minutes Intravenous On call to O.R. 01/11/16 0531 01/11/16 0753      Assessment/Plan: s/p Procedure(s): LAPAROSCOPIC PARTIAL GASTRECTOMY (N/A)  Advance diet. Possibly home tomorrow.     LOS: 2 days    [facility] 01/13/2016

## 2016-01-13 NOTE — Discharge Instructions (Signed)
[place] Office Phone Number [phone]   POST OP INSTRUCTIONS  Always review your discharge instruction sheet given to you by the facility where your surgery was performed.  IF YOU HAVE DISABILITY OR FAMILY LEAVE FORMS, YOU MUST BRING THEM TO THE OFFICE FOR PROCESSING.  DO NOT GIVE THEM TO YOUR DOCTOR.  1. A prescription for pain medication may be given to you upon discharge.  Take your pain medication as prescribed, if needed.  If narcotic pain medicine is not needed, then you may take acetaminophen (Tylenol) or ibuprofen (Advil) as needed. 2. Take your usually prescribed medications unless otherwise directed 3. If you need a refill on your pain medication, please contact your pharmacy.  They will contact our office to request authorization.  Prescriptions will not be filled after 5pm or on week-ends. 4. You should eat very light the first 24 hours after surgery, such as soup, crackers, pudding, etc.  Resume your normal diet the day after surgery 5. It is common to experience some constipation if taking pain medication after surgery.  Increasing fluid intake and taking a stool softener will usually help or prevent this problem from occurring.  A mild laxative (Milk of Magnesia or Miralax) should be taken according to package directions if there are no bowel movements after 48 hours. 6. You may shower in 48 hours.  The surgical glue will flake off in 2-3 weeks.   7. ACTIVITIES:  No strenuous activity or heavy lifting for 1 week.   a. You may drive when you no longer are taking prescription pain medication, you can comfortably wear a seatbelt, and you can safely maneuver your car and apply brakes. b. RETURN TO WORK:  _________1-6 weeks depending on how she is doing._______________ Dennis Bast should see your doctor in the office for a follow-up appointment approximately three-four weeks after your surgery.    WHEN TO CALL YOUR DOCTOR: 1. Fever over 101.0 2. Nausea and/or  vomiting. 3. Extreme swelling or bruising. 4. Continued bleeding from incision. 5. Increased pain, redness, or drainage from the incision.  The clinic staff is available to answer your questions during regular business hours.  Please dont hesitate to call and ask to speak to one of the nurses for clinical concerns.  If you have a medical emergency, go to the nearest emergency room or call 911.  A surgeon from [facility] is always on call at the hospital.  For further questions, please visit [email or web address]

## 2016-01-14 LAB — GLUCOSE, CAPILLARY: GLUCOSE-CAPILLARY: 112 mg/dL — AB (ref 65–99)

## 2016-01-14 MED ORDER — FAMOTIDINE 20 MG PO TABS
20.0000 mg | ORAL_TABLET | Freq: Two times a day (BID) | ORAL | Status: DC
  Administered 2016-01-14: 20 mg via ORAL

## 2016-01-14 MED ORDER — FAMOTIDINE 20 MG PO TABS: 20.0000 mg | ORAL_TABLET | Freq: Two times a day (BID) | ORAL | Status: DC

## 2016-01-14 MED ORDER — TRAMADOL HCL 50 MG PO TABS: 50.0000 mg | ORAL_TABLET | Freq: Four times a day (QID) | ORAL | Status: DC | PRN

## 2016-01-14 MED ORDER — FAMOTIDINE 20 MG PO TABS
20.0000 mg | ORAL_TABLET | Freq: Two times a day (BID) | ORAL | Status: DC
  Filled 2016-01-14: qty 1

## 2016-01-14 MED FILL — FAMOTIDINE 20 MG TABLET: 20 | 15 days supply | Qty: 30 | Fill #0

## 2016-01-14 MED FILL — traMADol HCL 50 MG TABS: 50 | 4 days supply | Qty: 30 | Fill #0

## 2016-01-14 NOTE — Progress Notes (Signed)
Discussed discharge summary with patient. Reviewed all medications with patient. Patient received Rx. Patient ready for discharge. 

## 2016-01-14 NOTE — Consult Note (Signed)
   [facility] CM Inpatient Consult   01/14/2016  ILER LO 1961-01-23 [id number]    Came to visit at bedside on behalf Link to [facility] Care Management program for [facility] employees/dependents with [facility] insurance. Discussed Link to Wellness program. Left packet with contact information at bedside. Made aware that she will receive post hospital discharge call. Confirmed best contact number as [phone]. Appreciative of visit.   Marthenia Rolling, MSN-Ed, RN,BSN [facility] Liaison [phone]

## 2016-01-17 ENCOUNTER — Other Ambulatory Visit (HOSPITAL_COMMUNITY): Payer: 59

## 2016-01-19 ENCOUNTER — Other Ambulatory Visit: Payer: Self-pay | Admitting: *Deleted

## 2016-01-19 NOTE — Patient Outreach (Addendum)
[facility]) Care Management  01/19/2016  Nichole Wilson [birth year] [id number]  Subjective: Telephone call to patient's home number, spoke with patient, and HIPAA verified.  Patient states she is doing okay and managing her pain.  Discussed [facility] Care Management services and patient in agreement to complete transition of care follow up.   Patient states she accidentally missed her lab appointment last week and has rescheduled it for this week.   States she has has a next week, follow up appointment with surgeon.  States she does not have the specific dates in front of her during conversation and has made appointments as instructed in discharge paperwork.  States she has a blood pressure cuff at home and will continue to monitor blood pressure.   Patient states her utilized the [employer] for her medications and has no pharmacy issues.   States she does not have any hypertension disease education, hypertension disease monitoring, community resources, or transportation issues at this time.    States she has completed her family medical leave paperwork.   Patient in agreement to receive [facility] Care Management information.  Objective: Per chart review: Patient hospitalized  01/11/16 - 01/14/16 for gastric mass and LAPAROSCOPIC PARTIAL GASTRECTOMY.    Patient also has a history of hypertension and breast cancer.   Assessment:  Received [facility] Transition of Care referral on 01/17/16.    Transition of care follow up completed and patient has no care coordination or transition of care follow up needs at this time.    Plan: RNCM will send patient successful outreach letter, [facility] Care Management pamphlet, magnet, and proceed with case closure. RNCM will send case closure due to program follow up completion to [facility] .    Mauri Temkin H. Annia Friendly, BSN, [place] [facility] Telephonic CM Phone: [phone] Fax: [phone]

## 2016-01-20 ENCOUNTER — Encounter: Payer: Self-pay | Admitting: *Deleted

## 2016-01-27 NOTE — Discharge Summary (Signed)
Physician Discharge Summary  Patient ID: Nichole Wilson MRN: [id number] DOB/AGE: [birth year] 53 y.o.  Admit date: 01/11/2016 Discharge date: 01/27/2016  Admission Diagnoses: Patient Active Problem List   Diagnosis Date Noted  . Essential hypertension 01/04/2016  . Gastric mass GIST vs leiomyoma 12/01/2015  . Anal fissure - posterior 11/26/2015  . Breast cancer of upper-outer quadrant of right female breast ([place]) 08/26/2015  . Breast calcification, right 07/27/2015  . Inversion, nipple 07/27/2015  . Fatigue 07/22/2015  . Anterior scleritis of both eyes 05/12/2015  . Overweight (BMI 25.0-29.9) 05/12/2015  . Pre-diabetes 05/12/2015  . Essential hypertension, benign 09/05/2013  . Plantar fasciitis 09/05/2013  . Health care maintenance 03/24/2013  . Epidermal cyst 06/17/2012  . Sciatica 05/17/2012  . Hepatic steatosis 01/02/2012  . Sweating 12/12/2011  . Mastitis 10/19/2010  . Hypercholesterolemia with hypertriglyceridemia 02/24/2009  . HYPERGLYCEMIA 02/24/2009  . Asymptomatic postmenopausal status 02/23/2009  . COMMON MIGRAINE 01/09/2007  . HEMORRHOIDS, NOS 09/13/2006    Discharge Diagnoses:  Same Active Problems:   Gastric mass GIST vs leiomyoma  Discharged Condition: stable  Hospital Course:  Pt was admitted to the floor following lap partial gastrectomy.  She had some pain and nausea up front.  This slowly abated and she was able to transition to a full liquid/soft diet.  She was able to ambulate and void independently.  She was discharged to home in stable condition.  Final pathology was benign.    Consults: None  Significant Diagnostic Studies: Cr 0.51, HCT 32.1 prior to discharge.    Treatments: surgery: see above.    Discharge Exam: Blood pressure 90/66, pulse 61, temperature 98.4 F (36.9 C), temperature source Oral, resp. rate 19, height 5' (1.524 m), weight 66.679 kg (147 lb), last menstrual period 11/29/2011, SpO2 100 %. General appearance: alert, cooperative  and no distress Resp: breathing comfortably GI: soft, non distended, non tender.    Disposition: 01-Home or Self Care  Discharge Instructions    AMB Referral to [facility]    Complete by:  As directed   Please assign UMR member for post hospital toc. To discharge home today 01/14/16. Thanks and please call with questions.Marthenia Rolling, [place], RN,BSN-THN [facility] [id number]  Reason for consult:  Please assign UMR member for post hospital toc  Expected date of contact:  1-3 days (reserved for hospital discharges)     Diet - low sodium heart healthy    Complete by:  As directed      Increase activity slowly    Complete by:  As directed             Medication List    TAKE these medications        acetaminophen 500 MG tablet  Commonly known as:  TYLENOL  Take 1,000 mg by mouth every 8 (eight) hours as needed for mild pain.     anastrozole 1 MG tablet  Commonly known as:  ARIMIDEX  Take 1 mg by mouth daily.     aspirin 81 MG tablet  Take 81 mg by mouth daily.     diltiazem 2 % Gel  Apply 1 application topically 3 (three) times daily.     famotidine 20 MG tablet  Commonly known as:  PEPCID  Take 1 tablet (20 mg total) by mouth 2 (two) times daily.     hydrochlorothiazide 12.5 MG tablet  Commonly known as:  HYDRODIURIL  Take 1 tablet (12.5 mg total) by mouth daily.     Icosapent Ethyl  1 g Caps  Take 2 g by mouth 2 (two) times daily with a meal.     lisinopril 10 MG tablet  Commonly known as:  PRINIVIL  Take 1 tablet (10 mg total) by mouth daily.     metFORMIN 500 MG tablet  Commonly known as:  GLUCOPHAGE  Take 500 mg by mouth daily.     traMADol 50 MG tablet  Commonly known as:  ULTRAM  Take 1-2 tablets (50-100 mg total) by mouth every 6 (six) hours as needed for moderate pain or severe pain.           Follow-up Information    Follow up with [facility] In 2 weeks.   Specialty:  General Surgery   Contact information:    [address] [place] [address] [phone]       Signed: [name] 01/27/2016, 7:40 AM

## 2016-02-07 ENCOUNTER — Other Ambulatory Visit: Payer: Self-pay | Admitting: Family Medicine

## 2016-02-07 ENCOUNTER — Telehealth: Payer: Self-pay | Admitting: Internal Medicine

## 2016-02-07 DIAGNOSIS — I10 Essential (primary) hypertension: Secondary | ICD-10-CM

## 2016-02-07 MED FILL — metFORMIN HCL 500 MG TABS: 500 | 30 days supply | Qty: 60 | Fill #3

## 2016-02-08 ENCOUNTER — Encounter: Payer: 59 | Admitting: Nurse Practitioner

## 2016-02-08 NOTE — Progress Notes (Deleted)
CLINIC:  Survivorship   REASON FOR VISIT:  Routine follow-up post-treatment for a recent history of breast cancer.  BRIEF ONCOLOGIC HISTORY:    Breast cancer of upper-outer quadrant of right female breast ([place])   11/15/2012 Imaging    DEXA scan: Normal. T-score -0.7     08/10/2015 Breast MRI    Enhancing mass in right breast upper outer quadrant 8 x 5 x 7 mm, no retroareolar right breast abnormality, no abnormality of the site of nipple retraction     08/18/2015 Initial Biopsy    Right breast biopsy: Invasive ductal carcinoma with DCIS, grade 2 with focal necrosis,ER 100%, PR 0%, Ki-67 5%,HER-2 positive ratio 4.52     08/19/2015 Mammogram    Right breast calcifications now seem to span 4 x 2 x 1.5 cm     09/15/2015 Surgery    Rt mastectomy [name]): IDC Grade 2, 0.18 cm, with DCIS, 0/1 LN, ER 100%, PR 0%, HER2 Positive 4.52, T1aN0M0 (Stage 1A)     10/01/2015 Echocardiogram    EF 60-65%     10/12/2015 - 11/23/2015 Chemotherapy    Herceptin every 3 weeks 3 cycles (based on [facility] trial: Short herceptin protocol)     11/2015 -  Anti-estrogen oral therapy    Anastrazole 1 mg daily. Planned duration of treatment: 5 years ([place])     01/03/2016 Echocardiogram    EF 60-65%       INTERVAL HISTORY:  Nichole Wilson presents to the [facility] today for our initial meeting to review her survivorship care plan detailing her treatment course for breast cancer, as well as monitoring long-term side effects of that treatment, education regarding health maintenance, screening, and overall wellness and health promotion.     Overall, Nichole Wilson reports feeling pretty well since her last Herceptin treatment in 11/2015.  She did undergo partial gastrectomy with Dr. Barry Dienes on 01/11/16 for a 4.1 cm gastric mass; path revealed leiomyoma, no malignancy.    REVIEW OF SYSTEMS:   GU: Denies vaginal bleeding, discharge, or dryness.  Breast: Denies any new nodularity, masses, tenderness, nipple changes,  or nipple discharge.    A 14-point review of systems was completed and was negative, except as noted above.   ONCOLOGY TREATMENT TEAM:  1. Breast Surgeon:  Dr. [name] at [facility] 2. Medical Oncologist: Dr. Lindi Adie    PAST MEDICAL/SURGICAL HISTORY:  Past Medical History:  Diagnosis Date  . Breast cyst, left 01-04-12   cyst no longer apparent  . Cancer [facility])    right breast cancer  . Cholelithiasis with choledocholithiasis   . Chronic calculus cholecystitis 12/25/2011   Lap chole on 03/25/12   . Complication of anesthesia 2002   PT STATES DURING HER C-SECTION SHE WAS AWARE OF INCISION AND PAIN -THEN WAS QUICKLY "PUT OUT"  . Diabetes mellitus without complication ([place])   . Family history of adverse reaction to anesthesia    father became aggressive  . Gallstone pancreatitis   . Glucose intolerance (impaired glucose tolerance)    since at least 2011.   [name] Hepatic steatosis   . Hypercholesterolemia with hypertriglyceridemia   . Hypertension 01-04-12   med d/c-last taken 12-07-11  -PT [place] STOPPED HER B/P MEDICATION BECAUSE HER B/P TOO LOW AND PT STATES HER B/P'S HAVE BEEN OK SINCE.  [name] Hypertriglyceridemia   . Migraine without aura, without mention of intractable migraine without mention of status migrainosus   . PONV (postoperative nausea and vomiting)    patient  states that she did not get sick with mastectomy  . Pre-diabetes    Past Surgical History:  Procedure Laterality Date  . CESAREAN SECTION     2002  . CHOLECYSTECTOMY  03/25/2012   Procedure: LAPAROSCOPIC CHOLECYSTECTOMY WITH INTRAOPERATIVE CHOLANGIOGRAM;  Surgeon: Haywood Lasso, MD;  Location: [facility];  Service: General;  Laterality: N/A;  Laparoscopic Cholecystectomy with Intraoperative Cholangiogram  . COLONOSCOPY    . ERCP  12/05/2011   Procedure: ENDOSCOPIC RETROGRADE CHOLANGIOPANCREATOGRAPHY (ERCP);  Surgeon: Inda Castle, MD;  Location: [facility];  Service: Endoscopy;   Laterality: N/A;  . ERCP  12/08/2011   Procedure: ENDOSCOPIC RETROGRADE CHOLANGIOPANCREATOGRAPHY (ERCP);  Surgeon: Inda Castle, MD;  Location: [place];  Service: Gastroenterology;  Laterality: N/A;  . ESOPHAGOGASTRODUODENOSCOPY (EGD) WITH PROPOFOL N/A 09/28/2015   Procedure: ESOPHAGOGASTRODUODENOSCOPY (EGD) WITH PROPOFOL;  Surgeon: Clarene Essex, MD;  Location: [facility] ENDOSCOPY;  Service: Endoscopy;  Laterality: N/A;  H AND P IN DRAWER  . EUS N/A 10/13/2015   Procedure: UPPER ENDOSCOPIC ULTRASOUND (EUS) RADIAL;  Surgeon: Arta Silence, MD;  Location: [facility];  Service: Endoscopy;  Laterality: N/A;  . LAPAROSCOPIC PARTIAL GASTRECTOMY N/A 01/11/2016   Procedure: LAPAROSCOPIC PARTIAL GASTRECTOMY;  Surgeon: [name], MD;  Location: [place];  Service: General;  Laterality: N/A;  . MASTECTOMY W/ SENTINEL NODE BIOPSY Right 09/15/2015   Procedure: RIGHT MASTECTOMY WITH SENTINEL LYMPH NODE BIOPSY;  Surgeon: Rolm Bookbinder, MD;  Location: [place];  Service: General;  Laterality: Right;  . UPPER GI ENDOSCOPY N/A 01/11/2016   Procedure: UPPER GI ENDOSCOPY;  Surgeon: [name], MD;  Location: [place];  Service: General;  Laterality: N/A;     ALLERGIES:  No Known Allergies   CURRENT MEDICATIONS:  Outpatient Encounter Prescriptions as of 02/09/2016  Medication Sig Note  . acetaminophen (TYLENOL) 500 MG tablet Take 1,000 mg by mouth every 8 (eight) hours as needed for mild pain.   [name] anastrozole (ARIMIDEX) 1 MG tablet Take 1 mg by mouth daily.   [name] aspirin 81 MG tablet Take 81 mg by mouth daily. 10/08/2015: Stopped for procedure   . diltiazem 2 % GEL Apply 1 application topically 3 (three) times daily.   . famotidine (PEPCID) 20 MG tablet Take 1 tablet (20 mg total) by mouth 2 (two) times daily.   . hydrochlorothiazide (MICROZIDE) 12.5 MG capsule TAKE 1 CAPSULE BY MOUTH ONCE DAILY   . Icosapent Ethyl 1 g CAPS Take 2 g by mouth 2 (two) times daily with a meal.   . lisinopril (PRINIVIL) 10 MG tablet Take 1  tablet (10 mg total) by mouth daily.   . metFORMIN (GLUCOPHAGE) 500 MG tablet Take 500 mg by mouth daily.   . traMADol (ULTRAM) 50 MG tablet Take 1-2 tablets (50-100 mg total) by mouth every 6 (six) hours as needed for moderate pain or severe pain.    No facility-administered encounter medications on file as of 02/09/2016.      ONCOLOGIC FAMILY HISTORY:  Family History  Problem Relation Age of Onset  . Diabetes Mother   . Diabetes Father   . Diabetes Brother   . Colon cancer Neg Hx   . Heart disease Paternal Uncle   . Liver disease Brother      GENETIC COUNSELING/TESTING: None  SOCIAL HISTORY:  Nichole Wilson is married and lives with her husband in [place], [place].  She has 2 daughters. 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  She denies any history or  current tobacco, alcohol, or illicit drug abuse.   PHYSICAL EXAMINATION:  Vital Signs:  There were no vitals filed for this visit. There were no vitals filed for this visit. General: Well-nourished, well-appearing female in no acute distress.  She is unaccompanied/accompanied in clinic by her ***** today.   HEENT: Head is normocephalic.  Pupils equal and reactive to light and accomodation. Conjunctivae clear without exudate.  Sclerae anicteric. Oral mucosa is pink, moist.  Oropharynx is pink without lesions or erythema.  Lymph: No cervical, supraclavicular, or infraclavicular lymphadenopathy noted on palpation.  Cardiovascular: Regular rate and rhythm.[name] Respiratory: Clear to auscultation bilaterally. Chest expansion symmetric; breathing non-labored.  GI: Abdomen soft and round; non-tender, non-distended. Bowel sounds normoactive. No hepatosplenomegaly.   GU: Deferred.  Neuro: No focal deficits. Steady gait.  Psych: Mood and affect normal and appropriate for situation.  Extremities: No edema. Skin: Warm and dry.  LABORATORY DATA:  None for this visit.  DIAGNOSTIC IMAGING:  None for  this visit.      ASSESSMENT AND PLAN:  Ms.. Wilson is a pleasant 55 y.o. female with Stage IA right breast invasive ductal carcinoma, ER+/PR-/HER2+, diagnosed in 08/2015, treated with right mastectomy, adjuvant anti-HER2 therapy with Herceptin x 3 cycles [based on shortened Herceptin course of treatment in [facility] trial] and anti-estrogen therapy with anastrazole beginning in 11/2015.  She presents to the Survivorship Clinic for our initial meeting and routine follow-up post-completion of treatment for breast cancer.    1. Stage IA right breast cancer:  Nichole Wilson is continuing to recover from definitive treatment for breast cancer. She will follow-up with her medical oncologist, Dr. Lindi Adie in 02/2016 with history and physical exam per surveillance protocol.  She will continue her anti-estrogen therapy with anastrazole as prescribed by Dr. Lindi Adie. She was instructed to make Dr. Lindi Adie or myself aware if she begins to experience any worsening side effects of the medication and I could see her back in clinic to help manage those side effects, as needed. A comprehensive survivorship care plan and treatment summary was reviewed with the patient today detailing her breast cancer diagnosis, treatment course, potential late/long-term effects of treatment, appropriate follow-up care with recommendations for the future, and patient education resources.  A copy of this summary, along with a letter will be sent to the patient's primary care provider via mail/fax/In Basket message after today's visit.    #. Problem(s) at Visit______________  #. Bone health:  Given Nichole Wilson's history of breast cancer and her current treatment regimen including anti-estrogen therapy with anastrazole, she is at risk for bone demineralization.  Per our records, her last DEXA scan was 11/15/12 and was normal. Dr. Lindi Adie or her PCP will consider repeat DEXA scan as clinically indicated.  In the meantime, she was encouraged to increase her  consumption of foods rich in calcium, as well as increase her weight-bearing activities.  She was given education on specific activities to promote bone health.  #. Cancer screening:  Due to Nichole Wilson's history and her age, she should receive screening for skin cancers, colon cancer, and gynecologic cancers.  The information and recommendations are listed on the patient's comprehensive care plan/treatment summary and were reviewed in detail with the patient.    #. Health maintenance and wellness promotion: Nichole Wilson was encouraged to consume 5-7 servings of fruits and vegetables per day. We reviewed the "Nutrition Rainbow" handout, as well as the handout about "Nutrition for Breast Cancer Survivors."  She was also encouraged to engage in moderate  to vigorous exercise for 30 minutes per day most days of the week. We discussed the [employer] fitness program, which is designed for cancer survivors to help them become more physically fit after cancer treatments.  She was instructed to limit her alcohol consumption and continue to abstain from tobacco use.   #. Support services/counseling: It is not uncommon for this period of the patient's cancer care trajectory to be one of many emotions and stressors.  We discussed an opportunity for her to participate in the next session of [facility] ("Finding Your New Normal") support group series designed for patients after they have completed treatment.   Nichole Wilson was encouraged to take advantage of our many other support services programs, support groups, and/or counseling in coping with her new life as a cancer survivor after completing anti-cancer treatment.  She was offered support today through active listening and expressive supportive counseling.  She was given information regarding our available services and encouraged to contact me with any questions or for help enrolling in any of our support group/programs.    Dispo:   -Return to cancer center to see Dr.  Lindi Adie in 02/2016.  -Left breast only mammogram will be due in 08/2016 (per Dr. Lindi Adie) -She is welcome to return back to the Survivorship Clinic at any time; no additional follow-up needed at this time.  -Consider referral back to survivorship as a long-term survivor for continued surveillance  A total of (#) minutes of face-to-face time was spent with this patient with greater than 50% of that time in counseling and care-coordination.   Mike Craze, NP Survivorship Program [place] [phone]   Note: PRIMARY CARE PROVIDER Adin Hector, [place] [phone]

## 2016-02-09 ENCOUNTER — Encounter: Payer: 59 | Admitting: Adult Health

## 2016-02-17 ENCOUNTER — Other Ambulatory Visit (HOSPITAL_COMMUNITY): Payer: Self-pay | Admitting: General Surgery

## 2016-02-17 DIAGNOSIS — R112 Nausea with vomiting, unspecified: Secondary | ICD-10-CM

## 2016-02-17 DIAGNOSIS — Z9889 Other specified postprocedural states: Principal | ICD-10-CM

## 2016-02-22 ENCOUNTER — Telehealth: Payer: Self-pay

## 2016-02-22 ENCOUNTER — Ambulatory Visit: Payer: 59 | Admitting: Hematology and Oncology

## 2016-02-22 NOTE — Assessment & Plan Note (Deleted)
Rt mastectomy 09/15/15: IDC Grade 2, 0.18 cm, with DCIS, 0/1 LN, ER 100%, PR 0%, HER2 Positive 4.52, [id number] (Stage 1A) Final tumor size: 0.35 cm on biopsy +0.18 cm on final lumpectomy equals 0.53 cm  Treatment summary: 1. shorter duration of anti-HER-2 therapy based upon [facility] trial where they used only 9 weeks of Herceptin treatment. Herceptin q 3 weeks 3 cycles completed 11/23/2015.  2. Adjuvant antiestrogen therapy with anastrozole 1 mg daily started 11/23/2015  Anastrozole toxicities:  Gastric biopsy: Revealed granulomatous inflammation suggestive of sarcoidosis versus Crohn's disease. Patient sees Dr. Paulita Fujita with gastroenterology.  Return to clinic in 6 months for follow-up.

## 2016-02-22 NOTE — Telephone Encounter (Signed)
Pt missed appt w/survivorship and Dr. Lindi Adie.  LMOVM checking on patient to make sure she is okay.  Pt to return call to clinic.

## 2016-03-10 ENCOUNTER — Other Ambulatory Visit (HOSPITAL_COMMUNITY): Payer: Self-pay | Admitting: General Surgery

## 2016-03-10 ENCOUNTER — Ambulatory Visit (HOSPITAL_COMMUNITY)
Admission: RE | Admit: 2016-03-10 | Discharge: 2016-03-10 | Disposition: A | Discharge status: Home / Self Care | Payer: 59 | Source: Ambulatory Visit | Attending: General Surgery | Admitting: General Surgery

## 2016-03-10 DIAGNOSIS — Z903 Acquired absence of stomach [part of]: Secondary | ICD-10-CM | POA: Insufficient documentation

## 2016-03-10 DIAGNOSIS — Z9889 Other specified postprocedural states: Principal | ICD-10-CM

## 2016-03-10 DIAGNOSIS — R197 Diarrhea, unspecified: Secondary | ICD-10-CM | POA: Diagnosis not present

## 2016-03-10 DIAGNOSIS — R112 Nausea with vomiting, unspecified: Secondary | ICD-10-CM | POA: Insufficient documentation

## 2016-03-10 DIAGNOSIS — K449 Diaphragmatic hernia without obstruction or gangrene: Secondary | ICD-10-CM | POA: Diagnosis not present

## 2016-03-10 DIAGNOSIS — R110 Nausea: Secondary | ICD-10-CM | POA: Diagnosis not present

## 2016-03-14 MED FILL — ANASTROZOLE 1 MG TABLET: 1 | 90 days supply | Qty: 90 | Fill #1

## 2016-03-28 ENCOUNTER — Telehealth: Payer: Self-pay

## 2016-03-28 NOTE — Telephone Encounter (Signed)
Sent her a page to call me, her voice mail was full. Per Dr [name] we can set up a direct EGD and pre-visit instead of her coming to see him 04/18/16 for abnormal upper GI findings.

## 2016-03-28 NOTE — Telephone Encounter (Signed)
Azlee called back and we have set up a pre-visit for 03/31/16 at 4pm and an EGD for 04/17/16 at 9:00AM, records taken upstairs to pre-visit room 51.

## 2016-03-30 ENCOUNTER — Encounter: Payer: 59 | Admitting: Internal Medicine

## 2016-03-31 ENCOUNTER — Ambulatory Visit (AMBULATORY_SURGERY_CENTER): Payer: Self-pay

## 2016-03-31 VITALS — Ht 61.0 in | Wt 139.6 lb

## 2016-03-31 DIAGNOSIS — K319 Disease of stomach and duodenum, unspecified: Secondary | ICD-10-CM

## 2016-03-31 MED FILL — HYDROCHLOROTHIAZIDE 12.5 MG: 12.5 | 90 days supply | Qty: 90 | Fill #0

## 2016-03-31 NOTE — Progress Notes (Signed)
Severe n/v with general anesthesia No allergies to eggs or soy No diet meds No home oxygen  Declined emmi

## 2016-04-10 ENCOUNTER — Telehealth: Payer: Self-pay | Admitting: Hematology and Oncology

## 2016-04-10 NOTE — Telephone Encounter (Signed)
Dr. Liliane Channel office left vm stating the patient no showed for an appt.

## 2016-04-17 ENCOUNTER — Encounter: Payer: Self-pay | Admitting: Internal Medicine

## 2016-04-17 ENCOUNTER — Ambulatory Visit (AMBULATORY_SURGERY_CENTER): Payer: 59 | Admitting: Internal Medicine

## 2016-04-17 VITALS — BP 147/95 | HR 73 | Temp 99.3°F | Resp 14 | Ht 61.0 in | Wt 139.0 lb

## 2016-04-17 DIAGNOSIS — K3189 Other diseases of stomach and duodenum: Secondary | ICD-10-CM

## 2016-04-17 DIAGNOSIS — R933 Abnormal findings on diagnostic imaging of other parts of digestive tract: Secondary | ICD-10-CM

## 2016-04-17 DIAGNOSIS — I10 Essential (primary) hypertension: Secondary | ICD-10-CM | POA: Diagnosis not present

## 2016-04-17 LAB — GLUCOSE, CAPILLARY
GLUCOSE-CAPILLARY: 128 mg/dL — AB (ref 65–99)
Glucose-Capillary: 104 mg/dL — ABNORMAL HIGH (ref 65–99)

## 2016-04-17 MED ORDER — SODIUM CHLORIDE 0.9 % IV SOLN: 500.0000 mL | INTRAVENOUS | Status: DC

## 2016-04-17 NOTE — Progress Notes (Signed)
Report given to PACU RN, vss 

## 2016-04-17 NOTE — Patient Instructions (Addendum)
There is food in the stomach - my thought is that the stomach is not emptying properly due to the surgery - we see this sometimes.  I did not see any structural problems though the food may have been covering something up.  Please follow a step 3 gastroparesis diet (see handout) for now and go to step 2 or 1 of you are vomiting.  Call and schedule a follow-up with me for 3-4 months (December or January).  There are medications that may help so if diet change is not working call back or send me a My Chart message.  I appreciate the opportunity to care for you. Gatha Mayer, MD, FACG     YOU HAD AN ENDOSCOPIC PROCEDURE TODAY AT [facility]:   Refer to the procedure report that was given to you for any specific questions about what was found during the examination.  If the procedure report does not answer your questions, please call your gastroenterologist to clarify.  If you requested that your care partner not be given the details of your procedure findings, then the procedure report has been included in a sealed envelope for you to review at your convenience later.  YOU SHOULD EXPECT: Some feelings of bloating in the abdomen. Passage of more gas than usual.  Walking can help get rid of the air that was put into your GI tract during the procedure and reduce the bloating. If you had a lower endoscopy (such as a colonoscopy or flexible sigmoidoscopy) you may notice spotting of blood in your stool or on the toilet paper. If you underwent a bowel prep for your procedure, you may not have a normal bowel movement for a few days.  Please Note:  You might notice some irritation and congestion in your nose or some drainage.  This is from the oxygen used during your procedure.  There is no need for concern and it should clear up in a day or so.  SYMPTOMS TO REPORT IMMEDIATELY:   Following lower endoscopy (colonoscopy or flexible sigmoidoscopy):  Excessive amounts of blood  in the stool  Significant tenderness or worsening of abdominal pains  Swelling of the abdomen that is new, acute  Fever of 100F or higher   Following upper endoscopy (EGD)  Vomiting of blood or coffee ground material  New chest pain or pain under the shoulder blades  Painful or persistently difficult swallowing  New shortness of breath  Fever of 100F or higher  Black, tarry-looking stools  For urgent or emergent issues, a gastroenterologist can be reached at any hour by calling [phone].   DIET:  Please follow the gastroparesis diet step 3 indefinitely.  Handout was provided.  Drink plenty of fluids but you should avoid alcoholic beverages for 24 hours.  ACTIVITY:  You should plan to take it easy for the rest of today and you should NOT DRIVE or use heavy machinery until tomorrow (because of the sedation medicines used during the test).    FOLLOW UP: Our staff will call the number listed on your records the next business day following your procedure to check on you and address any questions or concerns that you may have regarding the information given to you following your procedure. If we do not reach you, we will leave a message.  However, if you are feeling well and you are not experiencing any problems, there is no need to return our call.  We will assume that you have returned  to your regular daily activities without incident.  If any biopsies were taken you will be contacted by phone or by letter within the next 1-3 weeks.  Please call us at [phone] if you have not heard about the biopsies in 3 weeks.    SIGNATURES/CONFIDENTIALITY: You and/or your care partner have signed paperwork which will be entered into your electronic medical record.  These signatures attest to the fact that that the information above on your After Visit Summary has been reviewed and is understood.  Full responsibility of the confidentiality of this discharge information lies with you and/or  your care-partner.   Handouts were given to your care partner on gastroparesis diet - step 3  handout. Resume aspirin at prior dose today. You may resume your current medications today. Return to office to see Dr. [name] in 3 months either December or January. Please call if any questions or concerns.

## 2016-04-17 NOTE — Progress Notes (Signed)
No problems noted in the recovery room. maw 

## 2016-04-17 NOTE — Op Note (Signed)
Nichole Wilson Patient Name: Nichole Wilson Procedure Date: 04/17/2016 9:01 AM MRN: [id number] Endoscopist: Gatha Mayer , MD Age: 55 Referring MD:  Date of Birth: 09-29-1960 Gender: Female Account #: [id number] Procedure:                Upper GI endoscopy Indications:              Abnormal UGI series,                           S/P RESECTION OF GASTRIC LEIOMYOMA Medicines:                Propofol per Anesthesia, Monitored Anesthesia Care Procedure:                Pre-Anesthesia Assessment:                           - Prior to the procedure, a History and Physical                            was performed, and patient medications and                            allergies were reviewed. The patient's tolerance of                            previous anesthesia was also reviewed. The risks                            and benefits of the procedure and the sedation                            options and risks were discussed with the patient.                            All questions were answered, and informed consent                            was obtained. Prior Anticoagulants: The patient                            last took aspirin 5 days prior to the procedure.                            ASA Grade Assessment: II - A patient with mild                            systemic disease. After reviewing the risks and                            benefits, the patient was deemed in satisfactory                            condition to undergo the procedure.  After obtaining informed consent, the endoscope was                            passed under direct vision. Throughout the                            procedure, the patient's blood pressure, pulse, and                            oxygen saturations were monitored continuously. The                            Model GIF-HQ190 [id number]) scope was introduced                            through the mouth, and advanced to the  second part                            of duodenum. The upper GI endoscopy was                            accomplished without difficulty. The patient                            tolerated the procedure well. Scope In: Scope Out: Findings:                 A large amount of food (residue) was found in the                            gastric body and on the posterior wall of the                            stomach.                           The exam was otherwise without abnormality.                           Gastric retroflexion did not reveal any other                            abnormalities. Complications:            No immediate complications. Estimated Blood Loss:     Estimated blood loss: none. Impression:               - A large amount of food (residue) in the stomach.                           - The examination was otherwise normal.                           - No specimens collected. Recommendation:           - Patient has a contact number available for  emergencies. The signs and symptoms of potential                            delayed complications were discussed with the                            patient. Return to normal activities tomorrow.                            Written discharge instructions were provided to the                            patient.                           - Gastroparesis diet indefinitely.                           - Continue present medications.                           - Resume aspirin at prior dose today.                           - Return to my office in 3 months.                           -                           COULD NOT SEE SHELF-LIKE AREAS AS SEEN ON UPPER GI                            - ? FOOD OBSCURING VS FOOD WAS WHAT WAS SEEN ON UGI                           TREAT WITH GASTROPARESIS DIET STEP 3 FOR NOW -                            COULD ADD METACLOPRAMIDE IF NEEDED                           [name]                           -                           CC: DR. FAERA Corinda Gubler, MD 04/17/2016 9:36:44 AM This report has been signed electronically.

## 2016-04-18 ENCOUNTER — Telehealth: Payer: Self-pay

## 2016-04-18 ENCOUNTER — Ambulatory Visit: Payer: 59 | Admitting: Internal Medicine

## 2016-04-18 NOTE — Telephone Encounter (Signed)
  Follow up Call-  Call back number 04/17/2016 03/20/2014  Post procedure Call Back phone  # [phone]  Permission to leave phone message Yes Yes  Some recent data might be hidden     Patient questions:  Do you have a fever, pain , or abdominal swelling? No. Pain Score  0 *  Have you tolerated food without any problems? Yes.    Have you been able to return to your normal activities? Yes.    Do you have any questions about your discharge instructions: Diet   No. Medications  No. Follow up visit  No.  Do you have questions or concerns about your Care? No.  Actions: * If pain score is 4 or above: No action needed, pain <4.

## 2016-05-05 DIAGNOSIS — H16223 Keratoconjunctivitis sicca, not specified as Sjogren's, bilateral: Secondary | ICD-10-CM | POA: Diagnosis not present

## 2016-05-05 MED FILL — RESTASIS 0.05% EYE EMULSION: 0.05 | 90 days supply | Qty: 180 | Fill #0

## 2016-05-16 DIAGNOSIS — H16223 Keratoconjunctivitis sicca, not specified as Sjogren's, bilateral: Secondary | ICD-10-CM | POA: Diagnosis not present

## 2016-06-05 MED FILL — LISINOPRIL 10 MG TABLET: 10 | 30 days supply | Qty: 30 | Fill #1

## 2016-06-19 MED FILL — ANASTROZOLE 1 MG TABLET: 1 | 90 days supply | Qty: 90 | Fill #2

## 2016-06-23 ENCOUNTER — Telehealth: Payer: Self-pay | Admitting: Internal Medicine

## 2016-06-23 NOTE — Telephone Encounter (Signed)
Patient will come for appt with Ellouise Newer, PA On 07/04/16 10:15

## 2016-07-04 ENCOUNTER — Encounter: Payer: Self-pay | Admitting: Physician Assistant

## 2016-07-04 ENCOUNTER — Ambulatory Visit (INDEPENDENT_AMBULATORY_CARE_PROVIDER_SITE_OTHER): Payer: 59 | Admitting: Physician Assistant

## 2016-07-04 VITALS — BP 112/80 | HR 96 | Ht 62.0 in | Wt 140.2 lb

## 2016-07-04 DIAGNOSIS — K3184 Gastroparesis: Secondary | ICD-10-CM | POA: Diagnosis not present

## 2016-07-04 NOTE — Patient Instructions (Signed)
Continue gastroparesis diet. 

## 2016-07-04 NOTE — Progress Notes (Signed)
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 Patient regularly follows with Dr. [name] and was last seen in clinic on 11/26/2015. At that time she was seeing Dr. [name] for consult  of a spindle cell tumor in her stomach.   Patient has since had this removed and had follow-up EGD on 04/17/16 with findings of a large amount of food residue in the stomach and an otherwise normal exam. Patient was placed on a gastroparesis diet indefinitely and told to follow in our office in December /January.   Today, the patient tells me that she typically abides by the gastroparesis diet and tells me " I know when I don't". She tells me that when she eats the wrong foods or even just occasionally otherwise she will feel sick and bloated and have an epigastric discomfort. This occurred a week ago when she called our clinic and at that time she had an 8-9/10 epigastric pain which lasted for 2 days and has not come back since. She does ask me further questions regarding gastroparesis. She describes having only very occasional diarrhea, typically she has normal bowel movements. She does not have symptoms on a daily basis and as long as she eats small meals and avoids raw fiber she tends to do better. She denies any heartburn or reflux.   Patient denies fever, chills, blood in her stool, melena, change in bowel habits, weight loss, fatigue or anorexia or symptoms that waken her at night.  Past Medical History:  Diagnosis Date  . Breast cyst, left 01-04-12   cyst no longer apparent  . Cancer [facility])    right breast cancer  . Cholelithiasis with choledocholithiasis   . Chronic  calculus cholecystitis 12/25/2011   Lap chole on 03/25/12   . Complication of anesthesia 2002   PT STATES DURING HER C-SECTION SHE WAS AWARE OF INCISION AND PAIN -THEN WAS QUICKLY "PUT OUT"  . Diabetes mellitus without complication ([place])   . Family history of adverse reaction to anesthesia    father became aggressive  . Gallstone pancreatitis   . Glucose intolerance (impaired glucose tolerance)    since at least 2011.   [name] Hepatic steatosis   . Hypercholesterolemia with hypertriglyceridemia   . Hypertension 01-04-12   med d/c-last taken 12-07-11  -PT [facility] STOPPED HER B/P MEDICATION BECAUSE HER B/P TOO LOW AND PT STATES HER B/P'S HAVE BEEN OK SINCE.  [name] Hypertriglyceridemia   . Migraine without aura, without mention of intractable migraine without mention of status migrainosus   . PONV (postoperative nausea and vomiting)    patient states that she did not get sick with mastectomy  . Pre-diabetes     Past Surgical History:  Procedure Laterality Date  . CESAREAN SECTION     2002  . CHOLECYSTECTOMY  03/25/2012   Procedure: LAPAROSCOPIC CHOLECYSTECTOMY WITH INTRAOPERATIVE CHOLANGIOGRAM;  Surgeon: Haywood Lasso, MD;  Location: [facility];  Service: General;  Laterality: N/A;  Laparoscopic Cholecystectomy with Intraoperative Cholangiogram  . COLONOSCOPY    . ERCP  12/05/2011   Procedure: ENDOSCOPIC RETROGRADE CHOLANGIOPANCREATOGRAPHY (ERCP);  Surgeon: Inda Castle, MD;  Location: [facility];  Service: Endoscopy;  Laterality: N/A;  . ERCP  12/08/2011   Procedure: ENDOSCOPIC RETROGRADE CHOLANGIOPANCREATOGRAPHY (ERCP);  Surgeon: Inda Castle, MD;  Location: [place];  Service: Gastroenterology;  Laterality: N/A;  . ESOPHAGOGASTRODUODENOSCOPY (EGD) WITH PROPOFOL N/A 09/28/2015   Procedure: ESOPHAGOGASTRODUODENOSCOPY (EGD) WITH PROPOFOL;  Surgeon: Clarene Essex, MD;  Location: [facility];  Service: Endoscopy;  Laterality: N/A;  H AND P IN DRAWER  . EUS N/A 10/13/2015   Procedure:  UPPER ENDOSCOPIC ULTRASOUND (EUS) RADIAL;  Surgeon: Arta Silence, MD;  Location: [facility];  Service: Endoscopy;  Laterality: N/A;  . LAPAROSCOPIC PARTIAL GASTRECTOMY N/A 01/11/2016   Procedure: LAPAROSCOPIC PARTIAL GASTRECTOMY;  Surgeon: [name], MD;  Location: [place];  Service: General;  Laterality: N/A;  . MASTECTOMY W/ SENTINEL NODE BIOPSY Right 09/15/2015   Procedure: RIGHT MASTECTOMY WITH SENTINEL LYMPH NODE BIOPSY;  Surgeon: Rolm Bookbinder, MD;  Location: [place];  Service: General;  Laterality: Right;  . UPPER GI ENDOSCOPY N/A 01/11/2016   Procedure: UPPER GI ENDOSCOPY;  Surgeon: [name], MD;  Location: [place];  Service: General;  Laterality: N/A;    Current Outpatient Prescriptions  Medication Sig Dispense Refill  . anastrozole (ARIMIDEX) 1 MG tablet Take 1 mg by mouth daily.    [name] aspirin 81 MG tablet Take 81 mg by mouth daily.    . hydrochlorothiazide (MICROZIDE) 12.5 MG capsule TAKE 1 CAPSULE BY MOUTH ONCE DAILY 90 capsule 3  . lisinopril (PRINIVIL) 10 MG tablet Take 1 tablet (10 mg total) by mouth daily. 30 tablet 3  . metFORMIN (GLUCOPHAGE) 500 MG tablet Take 500 mg by mouth daily.     Current Facility-Administered Medications  Medication Dose Route Frequency Provider Last Rate Last Dose  . 0.9 %  sodium chloride infusion  500 mL Intravenous Continuous Gatha Mayer, MD        Allergies as of 07/04/2016 - Review Complete 07/04/2016  Allergen Reaction Noted  . Other  03/31/2016    Family History  Problem Relation Age of Onset  . Diabetes Mother   . Diabetes Father   . Diabetes Brother   . Heart disease Paternal Uncle   . Liver disease Brother   . Colon cancer Neg Hx     Social History   Social History  . Marital status: Married    Spouse name: N/A  . Number of children: 2  . Years of education: N/A   Occupational History  . 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    Social History Main Topics  . Smoking  status: Never Smoker  . Smokeless tobacco: Never Used  . Alcohol use No  . Drug use: No  . Sexual activity: Yes   Other Topics Concern  . Not on file   Social History Narrative   Married    - spanish interpreter   + children   No caffeine    Review of Systems:     Constitutional: No weight loss, fever or chills Cardiovascular: No chest pain Respiratory: No SOB  Gastrointestinal: See HPI and otherwise negative   Physical Exam:  Vital signs: BP 112/80 (BP Location: Left Arm, Patient Position: Sitting, Cuff Size: Normal)   Pulse 96   Ht 5\' 2"  (1.575 m)   Wt 140 lb 3.2 oz (63.6 kg)   LMP 11/29/2011   BMI 25.64 kg/m    Constitutional:   Pleasant Hispanic female appears to be in NAD, Well developed, Well nourished, alert and cooperative Respiratory: Respirations even and unlabored. Lungs clear to  auscultation bilaterally.   No wheezes, crackles, or rhonchi.  Cardiovascular: Normal S1, S2. No MRG. Regular rate and rhythm. No peripheral edema, cyanosis or pallor.  Gastrointestinal:  Soft, nondistended, nontender. No rebound or guarding. Normal bowel sounds. No appreciable masses or hepatomegaly. Psychiatric: Demonstrates good judgement and reason without abnormal affect or behaviors.  No recent labs.  Assessment: 1. Gastroparesis: Diagnosed at time of patient's last EGD 04/17/16 when there was a large amount of food residue left in the stomach after fasting, patient was placed on a gastroparesis stage III diet and per Dr. [name] and Reglan was to be discussed in the future if she had symptoms  Plan: 1. Had a long discussion with the patient today regarding gastroparesis. Explained that the metoclopramide can be used but does have a large side effect profile. We discussed these in detail. At this time the patient does not feel her symptoms are severe enough to try this medicine. We also discussed erythromycin and how this has less side effects but can stop working after a period of  time, or not work at all. The patient also declines this medication today telling me that she is not feeling bad enough. 2. Recommend the patient maintain a gastroparesis diet as instructed previously. 3. Will discuss with Dr. [name], per patient's request, whether she needs a follow-up EGD 4. Patient to follow in clinic in 2-3 months with Dr. [name] sooner if necessary.  Ellouise Newer, PA-C [facility] 07/04/2016, 10:30 AM  Cc: [place]

## 2016-07-06 ENCOUNTER — Other Ambulatory Visit (HOSPITAL_COMMUNITY): Payer: Self-pay | Admitting: Cardiology

## 2016-07-06 MED FILL — HYDROCHLOROTHIAZIDE 12.5 MG: 12.5 | 90 days supply | Qty: 90 | Fill #1

## 2016-07-07 DIAGNOSIS — H16143 Punctate keratitis, bilateral: Secondary | ICD-10-CM | POA: Diagnosis not present

## 2016-07-07 DIAGNOSIS — H16223 Keratoconjunctivitis sicca, not specified as Sjogren's, bilateral: Secondary | ICD-10-CM | POA: Diagnosis not present

## 2016-07-12 NOTE — Progress Notes (Signed)
Agree with Ms. Mort Sawyers evaluation and management. She had a benign leiomyoma so do not feel compelled to repeat EGD - will discuss with patient

## 2016-07-20 NOTE — Progress Notes (Signed)
I spoke to Nichole Wilson - will f/u me in Feb/Mar

## 2016-07-26 ENCOUNTER — Other Ambulatory Visit (HOSPITAL_COMMUNITY): Payer: Self-pay | Admitting: *Deleted

## 2016-07-26 MED ORDER — LISINOPRIL 10 MG PO TABS: 10.0000 mg | ORAL_TABLET | Freq: Every day | ORAL | 6 refills | Status: DC

## 2016-07-27 MED FILL — LISINOPRIL 10 MG TABLET: 10 | 90 days supply | Qty: 90 | Fill #0

## 2016-08-01 ENCOUNTER — Other Ambulatory Visit: Payer: Self-pay | Admitting: Hematology and Oncology

## 2016-08-01 DIAGNOSIS — Z853 Personal history of malignant neoplasm of breast: Secondary | ICD-10-CM

## 2016-08-08 ENCOUNTER — Other Ambulatory Visit: Payer: Self-pay

## 2016-08-08 ENCOUNTER — Other Ambulatory Visit: Payer: Self-pay | Admitting: Hematology and Oncology

## 2016-08-08 ENCOUNTER — Ambulatory Visit (HOSPITAL_BASED_OUTPATIENT_CLINIC_OR_DEPARTMENT_OTHER): Payer: 59

## 2016-08-08 DIAGNOSIS — C50411 Malignant neoplasm of upper-outer quadrant of right female breast: Secondary | ICD-10-CM

## 2016-08-08 DIAGNOSIS — R5383 Other fatigue: Secondary | ICD-10-CM

## 2016-08-08 DIAGNOSIS — Z79899 Other long term (current) drug therapy: Secondary | ICD-10-CM

## 2016-08-08 DIAGNOSIS — Z171 Estrogen receptor negative status [ER-]: Principal | ICD-10-CM

## 2016-08-08 LAB — COMPREHENSIVE METABOLIC PANEL
ALT: 23 U/L (ref 0–55)
AST: 21 U/L (ref 5–34)
Albumin: 3.9 g/dL (ref 3.5–5.0)
Alkaline Phosphatase: 137 U/L (ref 40–150)
Anion Gap: 15 mEq/L — ABNORMAL HIGH (ref 3–11)
BILIRUBIN TOTAL: 1.07 mg/dL (ref 0.20–1.20)
BUN: 14.7 mg/dL (ref 7.0–26.0)
CO2: 20 meq/L — AB (ref 22–29)
CREATININE: 0.8 mg/dL (ref 0.6–1.1)
Calcium: 9.6 mg/dL (ref 8.4–10.4)
Chloride: 105 mEq/L (ref 98–109)
EGFR: >90 mL/min/{1.73_m2} (ref >90)
GLUCOSE: 162 mg/dL — AB (ref 70–140)
Potassium: 3.7 mEq/L (ref 3.5–5.1)
SODIUM: 140 meq/L (ref 136–145)
TOTAL PROTEIN: 7.8 g/dL (ref 6.4–8.3)

## 2016-08-08 LAB — CBC WITH DIFFERENTIAL/PLATELET
BASO%: 0.2 % (ref 0.0–2.0)
Basophils Absolute: 0 10*3/uL (ref 0.0–0.1)
EOS%: 0.2 % (ref 0.0–7.0)
Eosinophils Absolute: 0 10*3/uL (ref 0.0–0.5)
HCT: 40 % (ref 34.8–46.6)
HEMOGLOBIN: 13.8 g/dL (ref 11.6–15.9)
LYMPH%: 4.7 % — ABNORMAL LOW (ref 14.0–49.7)
MCH: 32.1 pg (ref 25.1–34.0)
MCHC: 34.5 g/dL (ref 31.5–36.0)
MCV: 93.2 fL (ref 79.5–101.0)
MONO#: 0.2 10*3/uL (ref 0.1–0.9)
MONO%: 2.4 % (ref 0.0–14.0)
NEUT%: 92.5 % — ABNORMAL HIGH (ref 38.4–76.8)
NEUTROS ABS: 9.5 10*3/uL — AB (ref 1.5–6.5)
Platelets: 263 10*3/uL (ref 145–400)
RBC: 4.29 10*6/uL (ref 3.70–5.45)
RDW: 12.9 % (ref 11.2–14.5)
WBC: 10.3 10*3/uL (ref 3.9–10.3)
lymph#: 0.5 10*3/uL — ABNORMAL LOW (ref 0.9–3.3)

## 2016-08-08 LAB — TSH: TSH: 0.342 m(IU)/L (ref 0.308–3.960)

## 2016-08-08 NOTE — Progress Notes (Signed)
Lab unable to draw cortisol and acth. Not enough blood sample and pt will need to have it done in the morning. Notified pt and verbalized understanding. Will be here tomorrow 1/24 for lab at 830.

## 2016-08-09 ENCOUNTER — Other Ambulatory Visit (HOSPITAL_BASED_OUTPATIENT_CLINIC_OR_DEPARTMENT_OTHER): Payer: 59

## 2016-08-09 ENCOUNTER — Telehealth: Payer: Self-pay

## 2016-08-09 DIAGNOSIS — R5383 Other fatigue: Secondary | ICD-10-CM | POA: Diagnosis not present

## 2016-08-09 DIAGNOSIS — C50411 Malignant neoplasm of upper-outer quadrant of right female breast: Secondary | ICD-10-CM | POA: Diagnosis not present

## 2016-08-09 NOTE — Telephone Encounter (Signed)
Pt had labs done for acth/cortisol level this morning and wanting to know the results. Told pt that it is still pending at this time since it had to be done by an outside lab facility. Will check in a few days for results. Spoken to pt earlier regarding pt feeling extremely fatigue, most especially during the day time. Pt states that she struggles so much with getting out of bed. Pt currently taking anastrozole x6 months now. Pt states that she had been feeling well until recently. Pt even takes her medication at night to see if it would help. Advised pt to stop anastrozole for 1-2 weeks and keep a log to see if her symptoms starts improving. If not, she will need to call back and update [place] and have follow up with Dr.Gudena. Pt verbalized understanding and will update soon.

## 2016-08-10 LAB — CORTISOL: Cortisol: 8.5 ug/dL

## 2016-08-10 LAB — ACTH: ACTH: 5.9 pg/mL — ABNORMAL LOW (ref 7.2–63.3)

## 2016-08-10 NOTE — Progress Notes (Signed)
Pt results came back with cortisol level being normal. Notified pt today and requesting if b12 shots will work for her to help with fatigue. Discussed with Dr.Gudena and okay to do b-12 inj once a month for 6 months. Pt agreeable to plan. Will schedule pt once a month for injections starting next week for 6 months. Pt verbalized understanding.

## 2016-08-11 ENCOUNTER — Other Ambulatory Visit: Payer: Self-pay | Admitting: Hematology and Oncology

## 2016-08-11 ENCOUNTER — Ambulatory Visit
Admission: RE | Admit: 2016-08-11 | Discharge: 2016-08-11 | Disposition: A | Discharge status: Home / Self Care | Payer: 59 | Source: Ambulatory Visit | Attending: Hematology and Oncology | Admitting: Hematology and Oncology

## 2016-08-11 DIAGNOSIS — N6489 Other specified disorders of breast: Secondary | ICD-10-CM | POA: Diagnosis not present

## 2016-08-11 DIAGNOSIS — Z853 Personal history of malignant neoplasm of breast: Secondary | ICD-10-CM

## 2016-08-11 DIAGNOSIS — R922 Inconclusive mammogram: Secondary | ICD-10-CM | POA: Diagnosis not present

## 2016-08-15 ENCOUNTER — Ambulatory Visit (INDEPENDENT_AMBULATORY_CARE_PROVIDER_SITE_OTHER): Payer: 59 | Admitting: Internal Medicine

## 2016-08-15 ENCOUNTER — Other Ambulatory Visit: Payer: Self-pay | Admitting: Family Medicine

## 2016-08-15 ENCOUNTER — Encounter: Payer: Self-pay | Admitting: Internal Medicine

## 2016-08-15 VITALS — BP 100/62 | HR 76 | Temp 98.0°F | Ht 62.0 in | Wt 140.0 lb

## 2016-08-15 DIAGNOSIS — E782 Mixed hyperlipidemia: Secondary | ICD-10-CM

## 2016-08-15 DIAGNOSIS — R7303 Prediabetes: Secondary | ICD-10-CM

## 2016-08-15 DIAGNOSIS — Z23 Encounter for immunization: Secondary | ICD-10-CM | POA: Diagnosis not present

## 2016-08-15 DIAGNOSIS — I10 Essential (primary) hypertension: Secondary | ICD-10-CM

## 2016-08-15 LAB — POCT GLYCOSYLATED HEMOGLOBIN (HGB A1C): HEMOGLOBIN A1C: 5.7

## 2016-08-15 NOTE — Assessment & Plan Note (Signed)
Well controlled, did not take meds today and blood pressure is low. Some complaints of fatigue. Will trial patient off of HCTZ see if it improves fatigue  - Follow up in 1 month

## 2016-08-15 NOTE — Patient Instructions (Addendum)
I want you to stop the HCTZ and see how that helps with your fatigue. We may be lowering your blood pressure to much. Please follow up in 1 month to recheck your blood pressure

## 2016-08-15 NOTE — Addendum Note (Signed)
Addended by: Junious Dresser on: 08/15/2016 05:19 PM   Modules accepted: Orders

## 2016-08-15 NOTE — Assessment & Plan Note (Signed)
Currently on Pravastatin, no issues. Previous elevated triglycerides  - Will consider fenofibrate if elevated at this appointment

## 2016-08-15 NOTE — Assessment & Plan Note (Signed)
Recheck A1C today, well controlled on metformin. Has increased exercise and reduced sugar intake

## 2016-08-15 NOTE — Progress Notes (Signed)
   [facility] Kerrin Mo, MD Phone: [phone]  Reason For Visit: "Pap smear"   # CHRONIC HTN: Reports feeling fatigued at times.  Current Meds - currently taking HCTZ 12.5 mg and lisinopril 10 mg  Reports good compliance, did not take meds today. Tolerating well, w/o complaints. Lifestyle - walking daily  Denies CP, dyspnea, HA, edema, dizziness / lightheadedness   Patient with a pap smear in 2016 which was normal not due for a pap smear for another 2 years   # Prediabetes  - Taking metformin no issues with medication  -  Denies polyuria, visual changes, numbness or tingling  Past Medical History Reviewed problem list.  Medications- reviewed and updated No additions to family history Social history- patient is a non-smoker  Objective: BP 100/62   Pulse 76   Temp 98 F (36.7 C) (Oral)   Ht 5\' 2"  (1.575 m)   Wt 140 lb (63.5 kg)   LMP 11/29/2011   SpO2 97%   BMI 25.61 kg/m  Gen: NAD, alert, cooperative with exam Cardio: regular rate and rhythm, S1S2 heard, no murmurs appreciated Pulm: clear to auscultation bilaterally, no wheezes, rhonchi or rales Skin: dry, intact, no rashes or lesions   Assessment/Plan: See problem based a/p  Essential hypertension Well controlled, did not take meds today and blood pressure is low. Some complaints of fatigue. Will trial patient off of HCTZ see if it improves fatigue  - Follow up in 1 month   Pre-diabetes Recheck A1C today, well controlled on metformin. Has increased exercise and reduced sugar intake   Hypercholesterolemia with hypertriglyceridemia Currently on Pravastatin, no issues. Previous elevated triglycerides  - Will consider fenofibrate if elevated at this appointment

## 2016-08-16 ENCOUNTER — Encounter: Payer: Self-pay | Admitting: Internal Medicine

## 2016-08-17 ENCOUNTER — Other Ambulatory Visit: Payer: Self-pay | Admitting: Hematology and Oncology

## 2016-08-17 ENCOUNTER — Encounter: Payer: Self-pay | Admitting: Internal Medicine

## 2016-08-17 DIAGNOSIS — Z171 Estrogen receptor negative status [ER-]: Principal | ICD-10-CM

## 2016-08-17 DIAGNOSIS — C50411 Malignant neoplasm of upper-outer quadrant of right female breast: Secondary | ICD-10-CM

## 2016-08-18 ENCOUNTER — Ambulatory Visit: Payer: 59

## 2016-08-21 ENCOUNTER — Encounter: Payer: Self-pay | Admitting: Internal Medicine

## 2016-08-21 ENCOUNTER — Ambulatory Visit (HOSPITAL_BASED_OUTPATIENT_CLINIC_OR_DEPARTMENT_OTHER): Payer: 59

## 2016-08-21 DIAGNOSIS — R5382 Chronic fatigue, unspecified: Secondary | ICD-10-CM | POA: Diagnosis not present

## 2016-08-21 DIAGNOSIS — C50411 Malignant neoplasm of upper-outer quadrant of right female breast: Secondary | ICD-10-CM

## 2016-08-21 DIAGNOSIS — Z171 Estrogen receptor negative status [ER-]: Principal | ICD-10-CM

## 2016-08-21 MED ORDER — CYANOCOBALAMIN 1000 MCG/ML IJ SOLN
1000.0000 ug | Freq: Once | INTRAMUSCULAR | Status: AC
  Administered 2016-08-21: 1000 ug via INTRAMUSCULAR

## 2016-08-21 NOTE — Patient Instructions (Signed)
Cyanocobalamin, Vitamin B12 injection What is this medicine? CYANOCOBALAMIN (sye an oh koe BAL a min) is a man made form of vitamin B12. Vitamin B12 is used in the growth of healthy blood cells, nerve cells, and proteins in the body. It also helps with the metabolism of fats and carbohydrates. This medicine is used to treat people who can not absorb vitamin B12. This medicine may be used for other purposes; ask your health care provider or pharmacist if you have questions. COMMON BRAND NAME(S): B-12 Compliance Kit, B-12 Injection Kit, Cyomin, LA-12, Nutri-Twelve, Physicians EZ Use B-12, Primabalt What should I tell my health care provider before I take this medicine? They need to know if you have any of these conditions: -kidney disease -Leber's disease -megaloblastic anemia -an unusual or allergic reaction to cyanocobalamin, cobalt, other medicines, foods, dyes, or preservatives -pregnant or trying to get pregnant -breast-feeding How should I use this medicine? This medicine is injected into a muscle or deeply under the skin. It is usually given by a health care professional in a clinic or doctor's office. However, your doctor may teach you how to inject yourself. Follow all instructions. Talk to your pediatrician regarding the use of this medicine in children. Special care may be needed. Overdosage: If you think you have taken too much of this medicine contact a poison control center or emergency room at once. NOTE: This medicine is only for you. Do not share this medicine with others. What if I miss a dose? If you are given your dose at a clinic or doctor's office, call to reschedule your appointment. If you give your own injections and you miss a dose, take it as soon as you can. If it is almost time for your next dose, take only that dose. Do not take double or extra doses. What may interact with this medicine? -colchicine -heavy alcohol intake This list may not describe all possible  interactions. Give your health care provider a list of all the medicines, herbs, non-prescription drugs, or dietary supplements you use. Also tell them if you smoke, drink alcohol, or use illegal drugs. Some items may interact with your medicine. What should I watch for while using this medicine? Visit your doctor or health care professional regularly. You may need blood work done while you are taking this medicine. You may need to follow a special diet. Talk to your doctor. Limit your alcohol intake and avoid smoking to get the best benefit. What side effects may I notice from receiving this medicine? Side effects that you should report to your doctor or health care professional as soon as possible: -allergic reactions like skin rash, itching or hives, swelling of the face, lips, or tongue -blue tint to skin -chest tightness, pain -difficulty breathing, wheezing -dizziness -red, swollen painful area on the leg Side effects that usually do not require medical attention (report to your doctor or health care professional if they continue or are bothersome): -diarrhea -headache This list may not describe all possible side effects. Call your doctor for medical advice about side effects. You may report side effects to FDA at 1-800-FDA-1088. Where should I keep my medicine? Keep out of the reach of children. Store at room temperature between 15 and 30 degrees C (59 and 85 degrees F). Protect from light. Throw away any unused medicine after the expiration date. NOTE: This sheet is a summary. It may not cover all possible information. If you have questions about this medicine, talk to your doctor, pharmacist, or   health care provider.  2017 Elsevier/Gold Standard (2007-10-14 22:10:20)  

## 2016-09-04 ENCOUNTER — Telehealth: Payer: Self-pay | Admitting: Hematology and Oncology

## 2016-09-04 ENCOUNTER — Encounter: Payer: Self-pay | Admitting: Hematology and Oncology

## 2016-09-04 ENCOUNTER — Ambulatory Visit (HOSPITAL_BASED_OUTPATIENT_CLINIC_OR_DEPARTMENT_OTHER): Payer: 59 | Admitting: Hematology and Oncology

## 2016-09-04 DIAGNOSIS — R5383 Other fatigue: Secondary | ICD-10-CM | POA: Diagnosis not present

## 2016-09-04 DIAGNOSIS — C50411 Malignant neoplasm of upper-outer quadrant of right female breast: Secondary | ICD-10-CM

## 2016-09-04 DIAGNOSIS — Z170 Estrogen receptor positive status [ER+]: Secondary | ICD-10-CM

## 2016-09-04 NOTE — Progress Notes (Signed)
Patient Care Team: Verner Mould, MD as PCP - General (Family Medicine)  DIAGNOSIS:  Encounter Diagnosis  Name Primary?  . Malignant neoplasm of upper-outer quadrant of right breast in female, estrogen receptor positive ([place])     SUMMARY OF ONCOLOGIC HISTORY:   Breast cancer of upper-outer quadrant of right female breast ([place])   11/15/2012 Imaging    DEXA scan: Normal. T-score -0.7      08/10/2015 Breast MRI    Enhancing mass in right breast upper outer quadrant 8 x 5 x 7 mm, no retroareolar right breast abnormality, no abnormality of the site of nipple retraction      08/18/2015 Initial Biopsy    Right breast biopsy: Invasive ductal carcinoma with DCIS, grade 2 with focal necrosis,ER 100%, PR 0%, Ki-67 5%,HER-2 positive ratio 4.52      08/19/2015 Mammogram    Right breast calcifications now seem to span 4 x 2 x 1.5 cm      09/15/2015 Surgery    Rt mastectomy Nichole Wilson): IDC Grade 2, 0.18 cm, with DCIS, 0/1 LN, ER 100%, PR 0%, HER2 Positive 4.52, T1aN0M0 (Stage 1A)      10/12/2015 - 11/23/2015 Chemotherapy    Herceptin every 3 weeks 3 cycles (based on [facility] trial: Short herceptin protocol)      11/2015 -  Anti-estrogen oral therapy    Anastrazole 1 mg daily stopped 09/04/2016 due to severe fatigue       CHIEF COMPLIANT: Severe fatigue  INTERVAL HISTORY: Nichole Wilson is a 56 year old with above-mentioned history of right breast cancer treated with mastectomy followed by Herceptin treatment and is currently on anastrozole. She was complaining of severe fatigue. We have done extensive workup which did not reveal any clear etiology. She is here today to discuss what treatment options she has. She was also given B-12 injections without much benefit.  REVIEW OF SYSTEMS:   Constitutional: Denies fevers, chills or abnormal weight loss Eyes: Denies blurriness of vision Ears, nose, mouth, throat, and face: Denies mucositis or sore throat Respiratory: Denies cough,  dyspnea or wheezes Cardiovascular: Denies palpitation, chest discomfort Gastrointestinal:  Denies nausea, heartburn or change in bowel habits Skin: Denies abnormal skin rashes Lymphatics: Denies new lymphadenopathy or easy bruising Neurological:Denies numbness, tingling or new weaknesses Behavioral/Psych: Mood is stable, no new changes  Extremities: No lower extremity edema Breast:  denies any pain or lumps or nodules in either breasts All other systems were reviewed with the patient and are negative.  I have reviewed the past medical history, past surgical history, social history and family history with the patient and they are unchanged from previous note.  ALLERGIES:  is allergic to other.  MEDICATIONS:  Current Outpatient Prescriptions  Medication Sig Dispense Refill  . anastrozole (ARIMIDEX) 1 MG tablet Take 1 mg by mouth daily.    [name] aspirin 81 MG tablet Take 81 mg by mouth daily.    [name] lisinopril (PRINIVIL,ZESTRIL) 10 MG tablet Take 1 tablet (10 mg total) by mouth daily. 30 tablet 6  . metFORMIN (GLUCOPHAGE) 500 MG tablet Take 500 mg by mouth daily.     Current Facility-Administered Medications  Medication Dose Route Frequency Provider Last Rate Last Dose  . 0.9 %  sodium chloride infusion  500 mL Intravenous Continuous Gatha Mayer, MD        PHYSICAL EXAMINATION: ECOG PERFORMANCE STATUS: 1 - Symptomatic but completely ambulatory  Vitals:   09/04/16 1424  BP: 123/82  Pulse: 85  Resp: 18  Temp:  98.2 F (36.8 C)   Filed Weights   09/04/16 1424  Weight: 144 lb (65.3 kg)    GENERAL:alert, no distress and comfortable SKIN: skin color, texture, turgor are normal, no rashes or significant lesions EYES: normal, Conjunctiva are pink and non-injected, sclera clear OROPHARYNX:no exudate, no erythema and lips, buccal mucosa, and tongue normal  NECK: supple, thyroid normal size, non-tender, without nodularity LYMPH:  no palpable lymphadenopathy in the cervical, axillary or  inguinal LUNGS: clear to auscultation and percussion with normal breathing effort HEART: regular rate & rhythm and no murmurs and no lower extremity edema ABDOMEN:abdomen soft, non-tender and normal bowel sounds MUSCULOSKELETAL:no cyanosis of digits and no clubbing  NEURO: alert & oriented x 3 with fluent speech, no focal motor/sensory deficits EXTREMITIES: No lower extremity edema BREAST: No palpable masses or nodules in either right or left breasts. No palpable axillary supraclavicular or infraclavicular adenopathy no breast tenderness or nipple discharge. (exam performed in the presence of a chaperone)  LABORATORY DATA:  I have reviewed the data as listed   Chemistry      Component Value Date/Time   NA 140 08/08/2016 0904   K 3.7 08/08/2016 0904   CL 108 01/13/2016 0544   CO2 20 (L) 08/08/2016 0904   BUN 14.7 08/08/2016 0904   CREATININE 0.8 08/08/2016 0904      Component Value Date/Time   CALCIUM 9.6 08/08/2016 0904   ALKPHOS 137 08/08/2016 0904   AST 21 08/08/2016 0904   ALT 23 08/08/2016 0904   BILITOT 1.07 08/08/2016 0904       Lab Results  Component Value Date   WBC 10.3 08/08/2016   HGB 13.8 08/08/2016   HCT 40.0 08/08/2016   MCV 93.2 08/08/2016   PLT 263 08/08/2016   NEUTROABS 9.5 (H) 08/08/2016    ASSESSMENT & PLAN:  Breast cancer of upper-outer quadrant of right female breast ([place]) Rt mastectomy 09/15/15: IDC Grade 2, 0.18 cm, with DCIS, 0/1 LN, ER 100%, PR 0%, HER2 Positive 4.52, T1aN0M0 (Stage 1A) Final tumor size: 0.35 cm on biopsy +0.18 cm on final lumpectomy equals 0.53 cm  Treatment plan: 1. shorter duration of anti-HER-2 therapy based upon [facility] trial where they used only 9 weeks of Herceptin treatment. Herceptin q 3 weeks 3 cycles.  2. Adjuvant antiestrogen therapy with anastrozole 1 mg daily started 11/15/2015, stopped 09/04/2016 due to severe fatigue  Current treatment: Anastrozole on hold Severe fatigue: I instructed the patient to stop  anastrozole therapy. Previous workup did not reveal any thyroid hormone deficiency or adrenal insufficiency. She was also given B-12 injections without much benefit.  I instructed her that we should stop antiestrogen therapy for a month. At that time we can discuss restarting a different antiestrogen treatment like letrozole. The other option would be tamoxifen.  Return to clinic in one month for follow-up.   I spent 25 minutes talking to the patient of which more than half was spent in counseling and coordination of care.  No orders of the defined types were placed in this encounter.  The patient has a good understanding of the overall plan. she agrees with it. she will call with any problems that may develop before the next visit here.   Rulon Eisenmenger, MD 09/04/16

## 2016-09-04 NOTE — Telephone Encounter (Signed)
Pt came into office to sch follow up appt. Gave pt next available date/time per request

## 2016-09-04 NOTE — Assessment & Plan Note (Signed)
Rt mastectomy 09/15/15: IDC Grade 2, 0.18 cm, with DCIS, 0/1 LN, ER 100%, PR 0%, HER2 Positive 4.52, T1aN0M0 (Stage 1A) Final tumor size: 0.35 cm on biopsy +0.18 cm on final lumpectomy equals 0.53 cm  Treatment plan: 1. shorter duration of anti-HER-2 therapy based upon [facility] trial where they used only 9 weeks of Herceptin treatment. Herceptin q 3 weeks 3 cycles.  2. Adjuvant antiestrogen therapy with anastrozole 1 mg daily started 11/15/2015, stopped 09/04/2016 due to severe fatigue  Current treatment: Anastrozole on hold Severe fatigue: I instructed the patient to stop anastrozole therapy. Previous workup did not reveal any thyroid hormone deficiency or adrenal insufficiency. She was also given B-12 injections without much benefit.  I instructed her that we should stop antiestrogen therapy for a month. At that time we can discuss restarting a different antiestrogen treatment like letrozole. The other option would be tamoxifen.  Return to clinic in one month for follow-up.

## 2016-09-05 ENCOUNTER — Ambulatory Visit: Payer: 59 | Admitting: Internal Medicine

## 2016-09-14 ENCOUNTER — Ambulatory Visit (INDEPENDENT_AMBULATORY_CARE_PROVIDER_SITE_OTHER): Payer: 59 | Admitting: Internal Medicine

## 2016-09-14 ENCOUNTER — Encounter: Payer: Self-pay | Admitting: Internal Medicine

## 2016-09-14 VITALS — BP 108/72 | HR 86 | Temp 98.3°F | Ht 62.0 in | Wt 143.0 lb

## 2016-09-14 DIAGNOSIS — Z170 Estrogen receptor positive status [ER+]: Secondary | ICD-10-CM

## 2016-09-14 DIAGNOSIS — E119 Type 2 diabetes mellitus without complications: Secondary | ICD-10-CM | POA: Diagnosis not present

## 2016-09-14 DIAGNOSIS — C50411 Malignant neoplasm of upper-outer quadrant of right female breast: Secondary | ICD-10-CM

## 2016-09-14 DIAGNOSIS — I10 Essential (primary) hypertension: Secondary | ICD-10-CM | POA: Diagnosis not present

## 2016-09-14 LAB — PLEASE NOTE

## 2016-09-14 LAB — LIPID PANEL
CHOL/HDL RATIO: 4.5 ratio — AB (ref 0.0–4.4)
CHOLESTEROL TOTAL: 193 mg/dL (ref 100–199)
HDL: 43 mg/dL (ref >39)
LDL Calculated: 89 mg/dL (ref 0–99)
TRIGLYCERIDES: 303 mg/dL — AB (ref 0–149)
VLDL Cholesterol Cal: 61 mg/dL — ABNORMAL HIGH (ref 5–40)

## 2016-09-14 NOTE — Patient Instructions (Signed)
You are doing great. Keep of the good work. Please follow up in 6 months

## 2016-09-14 NOTE — Progress Notes (Signed)
   [facility] Kerrin Mo, MD Phone: [phone]  Reason For Visit:  F/U following for fatigue   CHRONIC HTN: Current Meds - Recently stopped HCTZ, patient previously with complaints of dizziness and low blood pressure in the clinic. Continue lisinopril 10 mg daily  Reports good compliance, took meds today. Tolerating well, w/o complaints. Lifestyle - has been increasing exercise and weight loss  Denies CP, dyspnea, HA, edema, dizziness / lightheadedness  CHRONIC DM, Type 2: Reports no concerns Has stopped metformin. Stopped taking this about 2 months ago.  Currently on ACEi / ARB  Any hypoglycemia episodes: Denies palpations, diaphoresis, fatigue, weakness, jittery Denies polyuria, visual changes, numbness or tingling.  Patient with mass noted on her left breast, hx of breast cancer in right breast  Currently getting ready to have that biopsied  She indicates being in good spirits for now. Denies any significant depression. Normal amount of anxiety for the news she has received. She does indicates some stress associated with her 54 years old daughter, however she states this is also about normal  Past Medical History Reviewed problem list.  Medications- reviewed and updated No additions to family history Social history- patient is a non- smoker  Objective: BP 108/72   Pulse 86   Temp 98.3 F (36.8 C) (Oral)   Ht 5\' 2"  (1.575 m)   Wt 143 lb (64.9 kg)   LMP 11/29/2011   BMI 26.16 kg/m  Gen: NAD, alert, cooperative with exam Cardio: regular rate and rhythm, S1S2 heard, no murmurs appreciated Pulm: clear to auscultation bilaterally, no wheezes, rhonchi or rales Skin: dry, intact, no rashes or lesions  Assessment/Plan: See problem based a/p  Essential hypertension Well controlled, stopped HCTZ at last visit. Patient blood pressure is doing well today. Will continue lisinopril for now. In the future, could consider stopping if patient is pushing  for this. However would weight in on renal protection that it provides   Diabetes (HCC) A1C 5.7. Stopped metformin about two months ago.  Well controlled with diet alone.   Breast cancer of upper-outer quadrant of right female breast [facility]) Doing well currently. Mass recently noted in left breast per oncology. Planning to have biopsy. Mental health stable for now - no concerns about depression/anxiety. Patient with positive outlook.

## 2016-09-15 ENCOUNTER — Ambulatory Visit: Payer: 59

## 2016-09-18 NOTE — Assessment & Plan Note (Signed)
Doing well currently. Mass recently noted in left breast per oncology. Planning to have biopsy. Mental health stable for now - no concerns about depression/anxiety. Patient with positive outlook.

## 2016-09-18 NOTE — Assessment & Plan Note (Addendum)
A1C 5.7. Stopped metformin about two months ago.  Well controlled with diet alone.

## 2016-09-18 NOTE — Assessment & Plan Note (Addendum)
Well controlled, stopped HCTZ at last visit. Patient blood pressure is doing well today. Will continue lisinopril for now. In the future, could consider stopping if patient is pushing for this. However would weight in on renal protection that it provides

## 2016-10-20 ENCOUNTER — Ambulatory Visit: Payer: 59

## 2016-11-03 MED FILL — LISINOPRIL 10 MG TABLET: 10 | 90 days supply | Qty: 90 | Fill #1

## 2016-11-03 MED FILL — ANASTROZOLE 1 MG TABLET: 1 | 90 days supply | Qty: 90 | Fill #3

## 2016-11-03 MED FILL — RESTASIS 0.05% EYE EMULSION: 0.05 | 90 days supply | Qty: 180 | Fill #1

## 2016-11-17 ENCOUNTER — Ambulatory Visit: Payer: 59

## 2016-12-06 ENCOUNTER — Ambulatory Visit (INDEPENDENT_AMBULATORY_CARE_PROVIDER_SITE_OTHER): Payer: 59 | Admitting: Physician Assistant

## 2016-12-06 ENCOUNTER — Encounter: Payer: Self-pay | Admitting: Physician Assistant

## 2016-12-06 VITALS — BP 112/76 | HR 92 | Ht 60.75 in | Wt 145.5 lb

## 2016-12-06 DIAGNOSIS — K3184 Gastroparesis: Secondary | ICD-10-CM | POA: Diagnosis not present

## 2016-12-06 DIAGNOSIS — R1012 Left upper quadrant pain: Secondary | ICD-10-CM

## 2016-12-06 DIAGNOSIS — R1032 Left lower quadrant pain: Secondary | ICD-10-CM | POA: Diagnosis not present

## 2016-12-06 NOTE — Patient Instructions (Signed)
We have given you a low fiber/low residue diet handout.

## 2016-12-06 NOTE — Progress Notes (Addendum)
Chief Complaint: Gastroparesis  HPI:  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     Patient normally follows with Dr. [name] but was last seen in clinic by me on 07/04/16. At that time, the patient told me that she typically abides by the gastroparesis diet and that she "knows when she doesn't". She described at that time, that a week ago she had an 8-9/10 epigastric pain which lasted for 2 days and had not come back since. She had further questions regarding gastroparesis. At that time we discussed Reglan and its side effect profile. The patient felt that her symptoms were not severe enough to try this medication. We also discussed erythromycin but the patient declined. Patient opted to stay on a gastroparesis diet as instructed previously. Repeat EGD was discussed with Dr. [name] but felt unnecessary at this time.   Today, the patient returns to clinic and tells me that about 2 weeks ago for 4 days in a row she had an extreme left her upper quadrant pain which hurt worse when she laughed or took a deep breath. She tells me that this felt "swollen and painful" she rated this pain as a 7-8/10 and tells me that it felt like a "blockage", even though she continued with normal stools. Associated with this pain was a lot of excess gas and bloating. She was unsure if she ate something that upset her or different in her diet. She started eating flaxseeds and " something else I can't remember", and this went away on its own after 4 days. Currently, she remains with a little residual discomfort in this region, but is much better today.   She has been abiding by a gastroparesis diet and has been doing fairly well  as far as that is concerned. She has no complaints today. She has learned to avoid salads completely.   Patient denies fever, chills, blood in her stool, melena, weight loss, fatigue, anorexia, change in bowel habits, recent medications, nausea, vomiting, heartburn, reflux or symptoms that awaken her at night.  Past Medical History:  Diagnosis Date  . Breast cyst, left 01-04-12   cyst no longer apparent  . Cancer [facility])    right breast cancer  . Cholelithiasis with choledocholithiasis   . Chronic calculus cholecystitis 12/25/2011   Lap chole on 03/25/12   . Complication of anesthesia 2002   PT STATES DURING HER C-SECTION SHE WAS AWARE OF INCISION AND PAIN -THEN WAS QUICKLY "PUT OUT"  . Diabetes mellitus without complication ([place])   . Family history of adverse reaction to anesthesia    father became aggressive  . Gallstone pancreatitis   . Glucose intolerance (impaired glucose tolerance)    since at least 2011.   [name] Hepatic steatosis   . Hypercholesterolemia with hypertriglyceridemia   . Hypertension 01-04-12   med d/c-last taken 12-07-11  -PT McPherson STOPPED HER B/P MEDICATION BECAUSE HER B/P TOO LOW AND PT STATES HER B/P'S HAVE BEEN OK SINCE.  [name] Hypertriglyceridemia   . Migraine without aura, without mention of intractable migraine without mention of status migrainosus   . PONV (postoperative nausea and vomiting)    patient states that she did not get sick with mastectomy  . Pre-diabetes  Past Surgical History:  Procedure Laterality Date  . CESAREAN SECTION     2002  . CHOLECYSTECTOMY  03/25/2012   Procedure: LAPAROSCOPIC CHOLECYSTECTOMY WITH INTRAOPERATIVE CHOLANGIOGRAM;  Surgeon: Haywood Lasso, MD;  Location: [facility];  Service: General;  Laterality: N/A;  Laparoscopic Cholecystectomy with Intraoperative Cholangiogram  . COLONOSCOPY    . ERCP  12/05/2011   Procedure: ENDOSCOPIC RETROGRADE CHOLANGIOPANCREATOGRAPHY (ERCP);  Surgeon: Inda Castle, MD;  Location:  [facility];  Service: Endoscopy;  Laterality: N/A;  . ERCP  12/08/2011   Procedure: ENDOSCOPIC RETROGRADE CHOLANGIOPANCREATOGRAPHY (ERCP);  Surgeon: Inda Castle, MD;  Location: [place];  Service: Gastroenterology;  Laterality: N/A;  . ESOPHAGOGASTRODUODENOSCOPY (EGD) WITH PROPOFOL N/A 09/28/2015   Procedure: ESOPHAGOGASTRODUODENOSCOPY (EGD) WITH PROPOFOL;  Surgeon: Clarene Essex, MD;  Location: [facility]COPY;  Service: Endoscopy;  Laterality: N/A;  H AND P IN DRAWER  . EUS N/A 10/13/2015   Procedure: UPPER ENDOSCOPIC ULTRASOUND (EUS) RADIAL;  Surgeon: Arta Silence, MD;  Location: [facility];  Service: Endoscopy;  Laterality: N/A;  . LAPAROSCOPIC PARTIAL GASTRECTOMY N/A 01/11/2016   Procedure: LAPAROSCOPIC PARTIAL GASTRECTOMY;  Surgeon: [name], MD;  Location: [place];  Service: General;  Laterality: N/A;  . MASTECTOMY W/ SENTINEL NODE BIOPSY Right 09/15/2015   Procedure: RIGHT MASTECTOMY WITH SENTINEL LYMPH NODE BIOPSY;  Surgeon: Rolm Bookbinder, MD;  Location: [place];  Service: General;  Laterality: Right;  . UPPER GI ENDOSCOPY N/A 01/11/2016   Procedure: UPPER GI ENDOSCOPY;  Surgeon: [name], MD;  Location: [place];  Service: General;  Laterality: N/A;    Current Outpatient Prescriptions  Medication Sig Dispense Refill  . AMBULATORY NON FORMULARY MEDICATION Bio-6-Plus Take 1 capsule by mouth three times a day with meals    . anastrozole (ARIMIDEX) 1 MG tablet Take 1 mg by mouth daily.    [name] aspirin 81 MG tablet Take 81 mg by mouth daily.    . Cholecalciferol (VITAMIN D3) 10000 units capsule Take 10,000 Units by mouth daily.    . Flaxseed, Linseed, (FLAX SEED OIL) 1000 MG CAPS Take 1 capsule by mouth 3 (three) times daily with meals.    [name] lisinopril (PRINIVIL,ZESTRIL) 10 MG tablet Take 1 tablet (10 mg total) by mouth daily. 30 tablet 6   Current Facility-Administered Medications  Medication Dose Route Frequency Provider Last Rate Last Dose  . 0.9 %  sodium chloride infusion  500 mL  Intravenous Continuous Gatha Mayer, MD        Allergies as of 12/06/2016 - Review Complete 12/06/2016  Allergen Reaction Noted  . Other  03/31/2016    Family History  Problem Relation Age of Onset  . Diabetes Mother   . Diabetes Father   . Diabetes Brother   . Heart disease Paternal Uncle   . Liver disease Brother   . Colon cancer Neg Hx     Social History   Social History  . Marital status: Married    Spouse name: N/A  . Number of children: 2  . Years of education: N/A   Occupational History  . 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    Social History Main Topics  . Smoking status: Never Smoker  . Smokeless tobacco: Never Used  . Alcohol use No  . Drug use: No  . Sexual activity: Yes   Other Topics Concern  . Not on file   Social History Narrative   Married    - spanish interpreter   +  children   No caffeine    Review of Systems:    Constitutional: No weight loss, fever or chills Cardiovascular: No chest pain   Respiratory: No SOB  Gastrointestinal: See HPI and otherwise negative   Physical Exam:  Vital signs: BP 112/76 (BP Location: Left Arm, Patient Position: Sitting, Cuff Size: Normal)   Pulse 92   Ht 5' 0.75" (1.543 m) Comment: height measured without shoes  Wt 145 lb 8 oz (66 kg)   LMP 11/29/2011   BMI 27.72 kg/m    Constitutional:   Pleasant Hispanic female appears to be in NAD, Well developed, Well nourished, alert and cooperative Respiratory: Respirations even and unlabored. Lungs clear to auscultation bilaterally.   No wheezes, crackles, or rhonchi.  Cardiovascular: Normal S1, S2. No MRG. Regular rate and rhythm. No peripheral edema, cyanosis or pallor.  Gastrointestinal:  Soft, nondistended, mild left upper quadrant TTP, moderate left lower quadrant TTP No rebound or guarding. Normal bowel sounds. No appreciable masses or hepatomegaly. Rectal:  Not performed.  Msk:  Symmetrical without gross  deformities. Without edema, no deformity or joint abnormality. No tenderness over left lower ribs Psychiatric: Demonstrates good judgement and reason without abnormal affect or behaviors.  RELEVANT LABS AND IMAGING: CBC    Component Value Date/Time   WBC 10.3 08/08/2016 0904   WBC 6.9 01/13/2016 0544   RBC 4.29 08/08/2016 0904   RBC 3.46 (L) 01/13/2016 0544   HGB 13.8 08/08/2016 0904   HCT 40.0 08/08/2016 0904   PLT 263 08/08/2016 0904   MCV 93.2 08/08/2016 0904   MCH 32.1 08/08/2016 0904   MCH 30.1 01/13/2016 0544   MCHC 34.5 08/08/2016 0904   MCHC 32.4 01/13/2016 0544   RDW 12.9 08/08/2016 0904   LYMPHSABS 0.5 (L) 08/08/2016 0904   MONOABS 0.2 08/08/2016 0904   EOSABS 0.0 08/08/2016 0904   BASOSABS 0.0 08/08/2016 0904    CMP     Component Value Date/Time   NA 140 08/08/2016 0904   K 3.7 08/08/2016 0904   CL 108 01/13/2016 0544   CO2 20 (L) 08/08/2016 0904   GLUCOSE 162 (H) 08/08/2016 0904   BUN 14.7 08/08/2016 0904   CREATININE 0.8 08/08/2016 0904   CALCIUM 9.6 08/08/2016 0904   PROT 7.8 08/08/2016 0904   ALBUMIN 3.9 08/08/2016 0904   AST 21 08/08/2016 0904   ALT 23 08/08/2016 0904   ALKPHOS 137 08/08/2016 0904   BILITOT 1.07 08/08/2016 0904   GFRNONAA >60 01/13/2016 0544   GFRAA >60 01/13/2016 0544    Assessment: 1.Left upper quadrant abdominal pain: This pain is now better but occurred 4 days in a row about 2 weeks ago associated with a lot of bloating and gas, patient to continue with normal bowel movements at this time consider splenic flexure syndrome versus other 2. Left lower quadrant abdominal pain: Found at time of exam today, patient is unaware of this pain until I palpated it, consider early diverticular disease as her last colonoscopy did show moderate diverticular disease throughout her colon, recommend low fiber/ low residue diet for now, if this worsens she should call and we can get her abx 3. Gastroparesis: Patient doing well on gastroparesis  diet  Plan: 1. Discussed with the patient that she may have early diverticular disease due to her left lower quadrant pain on exam, though she did not complain of this coming in and was "unaware of this pain until I palpated", would recommend she maintain a low fiber/ low residue diet for  the next week or so and then gradually increase fiber back in her diet as long she does not have increasing pain. If this pain does increase she is to call our office and we will prescribe metronidazole and ciprofloxacin 7 days. Patient is aware and will call us if she continues with pain or has increasing pain in this region. 2. Discussed patient's left upper quadrant pain and gas buildup, question if this was related to splenic flexure syndrome, discussed this with the patient today. This pain is much better now and patient is having regular bowel movements and does not feel as bloated. 3. Patient to continue gastroparesis diet and previous recommendations. 4. Patient to follow in clinic as needed with Dr. [name] or myself.  Ellouise Newer, PA-C [facility] 12/06/2016, 8:42 AM  Cc: [facility] with Ms. Kayle Passarelli's evaluation and management. Gatha Mayer, MD, [facility]

## 2016-12-13 DIAGNOSIS — H5203 Hypermetropia, bilateral: Secondary | ICD-10-CM | POA: Diagnosis not present

## 2016-12-13 DIAGNOSIS — H52223 Regular astigmatism, bilateral: Secondary | ICD-10-CM | POA: Diagnosis not present

## 2016-12-13 DIAGNOSIS — H524 Presbyopia: Secondary | ICD-10-CM | POA: Diagnosis not present

## 2016-12-15 ENCOUNTER — Ambulatory Visit: Payer: 59

## 2017-01-19 ENCOUNTER — Ambulatory Visit: Payer: 59

## 2017-02-16 ENCOUNTER — Other Ambulatory Visit: Payer: 59

## 2017-02-21 MED FILL — LISINOPRIL 10 MG TABLET: 10 | 30 days supply | Qty: 30 | Fill #2

## 2017-03-02 ENCOUNTER — Ambulatory Visit
Admission: RE | Admit: 2017-03-02 | Discharge: 2017-03-02 | Disposition: A | Discharge status: Home / Self Care | Payer: 59 | Source: Ambulatory Visit | Attending: Hematology and Oncology | Admitting: Hematology and Oncology

## 2017-03-02 ENCOUNTER — Other Ambulatory Visit: Payer: 59

## 2017-03-02 ENCOUNTER — Other Ambulatory Visit: Payer: Self-pay | Admitting: Hematology and Oncology

## 2017-03-02 DIAGNOSIS — C50411 Malignant neoplasm of upper-outer quadrant of right female breast: Secondary | ICD-10-CM

## 2017-03-02 DIAGNOSIS — Z171 Estrogen receptor negative status [ER-]: Principal | ICD-10-CM

## 2017-03-02 DIAGNOSIS — N6489 Other specified disorders of breast: Secondary | ICD-10-CM | POA: Diagnosis not present

## 2017-03-02 DIAGNOSIS — R922 Inconclusive mammogram: Secondary | ICD-10-CM | POA: Diagnosis not present

## 2017-03-05 ENCOUNTER — Other Ambulatory Visit: Payer: Self-pay

## 2017-03-05 ENCOUNTER — Other Ambulatory Visit: Payer: Self-pay | Admitting: Hematology and Oncology

## 2017-03-05 DIAGNOSIS — R1012 Left upper quadrant pain: Secondary | ICD-10-CM

## 2017-03-05 DIAGNOSIS — C50411 Malignant neoplasm of upper-outer quadrant of right female breast: Secondary | ICD-10-CM

## 2017-03-05 DIAGNOSIS — Z170 Estrogen receptor positive status [ER+]: Principal | ICD-10-CM

## 2017-03-05 MED FILL — ANASTROZOLE 1 MG TABLET: 1 | 90 days supply | Qty: 90 | Fill #0

## 2017-03-06 ENCOUNTER — Other Ambulatory Visit: Payer: Self-pay

## 2017-03-06 ENCOUNTER — Telehealth: Payer: Self-pay

## 2017-03-06 DIAGNOSIS — C50411 Malignant neoplasm of upper-outer quadrant of right female breast: Secondary | ICD-10-CM

## 2017-03-06 MED ORDER — ANASTROZOLE 1 MG PO TABS: 1.0000 mg | ORAL_TABLET | Freq: Every day | ORAL | 3 refills | Status: DC

## 2017-03-06 NOTE — Telephone Encounter (Signed)
Called and spoke with pt regarding her appt for ct abd/pelvis and mri breast. Confirmed appts and instructions. Pt verbalized understanding.No further questions at this time.

## 2017-03-08 ENCOUNTER — Telehealth: Payer: Self-pay | Admitting: Hematology and Oncology

## 2017-03-08 NOTE — Telephone Encounter (Signed)
Spoke with patient re contacting [facility] to schedule mri. Information provided.

## 2017-03-12 ENCOUNTER — Other Ambulatory Visit: Payer: Self-pay

## 2017-03-12 DIAGNOSIS — Z170 Estrogen receptor positive status [ER+]: Principal | ICD-10-CM

## 2017-03-12 DIAGNOSIS — C50411 Malignant neoplasm of upper-outer quadrant of right female breast: Secondary | ICD-10-CM

## 2017-03-13 ENCOUNTER — Ambulatory Visit (HOSPITAL_COMMUNITY)
Admission: RE | Admit: 2017-03-13 | Discharge: 2017-03-13 | Disposition: A | Discharge status: Home / Self Care | Payer: 59 | Source: Ambulatory Visit | Attending: Hematology and Oncology | Admitting: Hematology and Oncology

## 2017-03-13 ENCOUNTER — Other Ambulatory Visit: Payer: Self-pay | Admitting: Hematology and Oncology

## 2017-03-13 ENCOUNTER — Other Ambulatory Visit (HOSPITAL_BASED_OUTPATIENT_CLINIC_OR_DEPARTMENT_OTHER): Payer: 59

## 2017-03-13 DIAGNOSIS — K760 Fatty (change of) liver, not elsewhere classified: Secondary | ICD-10-CM | POA: Diagnosis not present

## 2017-03-13 DIAGNOSIS — K5730 Diverticulosis of large intestine without perforation or abscess without bleeding: Secondary | ICD-10-CM | POA: Insufficient documentation

## 2017-03-13 DIAGNOSIS — R1012 Left upper quadrant pain: Secondary | ICD-10-CM

## 2017-03-13 DIAGNOSIS — Z170 Estrogen receptor positive status [ER+]: Principal | ICD-10-CM

## 2017-03-13 DIAGNOSIS — I700 Atherosclerosis of aorta: Secondary | ICD-10-CM | POA: Insufficient documentation

## 2017-03-13 DIAGNOSIS — Z171 Estrogen receptor negative status [ER-]: Secondary | ICD-10-CM

## 2017-03-13 DIAGNOSIS — C50411 Malignant neoplasm of upper-outer quadrant of right female breast: Secondary | ICD-10-CM | POA: Diagnosis not present

## 2017-03-13 LAB — CBC WITH DIFFERENTIAL/PLATELET
BASO%: 0.3 % (ref 0.0–2.0)
BASOS ABS: 0 10*3/uL (ref 0.0–0.1)
EOS ABS: 0.1 10*3/uL (ref 0.0–0.5)
EOS%: 1.2 % (ref 0.0–7.0)
HCT: 39.2 % (ref 34.8–46.6)
HGB: 13.1 g/dL (ref 11.6–15.9)
LYMPH%: 44.2 % (ref 14.0–49.7)
MCH: 31.6 pg (ref 25.1–34.0)
MCHC: 33.4 g/dL (ref 31.5–36.0)
MCV: 94.7 fL (ref 79.5–101.0)
MONO#: 0.4 10*3/uL (ref 0.1–0.9)
MONO%: 6.3 % (ref 0.0–14.0)
NEUT%: 48 % (ref 38.4–76.8)
NEUTROS ABS: 2.8 10*3/uL (ref 1.5–6.5)
PLATELETS: 254 10*3/uL (ref 145–400)
RBC: 4.14 10*6/uL (ref 3.70–5.45)
RDW: 13 % (ref 11.2–14.5)
WBC: 5.9 10*3/uL (ref 3.9–10.3)
lymph#: 2.6 10*3/uL (ref 0.9–3.3)

## 2017-03-13 LAB — COMPREHENSIVE METABOLIC PANEL
ALBUMIN: 4 g/dL (ref 3.5–5.0)
ALK PHOS: 126 U/L (ref 40–150)
ALT: 35 U/L (ref 0–55)
ANION GAP: 8 meq/L (ref 3–11)
AST: 24 U/L (ref 5–34)
BILIRUBIN TOTAL: 0.75 mg/dL (ref 0.20–1.20)
BUN: 10.8 mg/dL (ref 7.0–26.0)
CALCIUM: 10 mg/dL (ref 8.4–10.4)
CO2: 28 mEq/L (ref 22–29)
Chloride: 104 mEq/L (ref 98–109)
Creatinine: 0.7 mg/dL (ref 0.6–1.1)
Glucose: 118 mg/dl (ref 70–140)
POTASSIUM: 4 meq/L (ref 3.5–5.1)
Sodium: 140 mEq/L (ref 136–145)
TOTAL PROTEIN: 8 g/dL (ref 6.4–8.3)

## 2017-03-13 MED ORDER — IOPAMIDOL (ISOVUE-300) INJECTION 61%
INTRAVENOUS | Status: AC
  Filled 2017-03-13: qty 100

## 2017-03-13 MED ORDER — IOPAMIDOL (ISOVUE-300) INJECTION 61%: 100.0000 mL | Freq: Once | INTRAVENOUS | Status: DC | PRN

## 2017-03-13 MED ORDER — IOPAMIDOL (ISOVUE-300) INJECTION 61%
100.0000 mL | Freq: Once | INTRAVENOUS | Status: AC | PRN
  Administered 2017-03-13: 100 mL via INTRAVENOUS

## 2017-03-14 LAB — ACTH: ACTH: 16.7 pg/mL (ref 7.2–63.3)

## 2017-03-14 LAB — CORTISOL: CORTISOL: 8.6 ug/dL

## 2017-03-16 ENCOUNTER — Inpatient Hospital Stay: Admission: RE | Admit: 2017-03-16 | Payer: 59 | Source: Ambulatory Visit

## 2017-03-16 ENCOUNTER — Ambulatory Visit
Admission: RE | Admit: 2017-03-16 | Discharge: 2017-03-16 | Disposition: A | Discharge status: Home / Self Care | Payer: 59 | Source: Ambulatory Visit | Attending: Hematology and Oncology | Admitting: Hematology and Oncology

## 2017-03-16 DIAGNOSIS — C50411 Malignant neoplasm of upper-outer quadrant of right female breast: Secondary | ICD-10-CM

## 2017-03-16 DIAGNOSIS — Z170 Estrogen receptor positive status [ER+]: Secondary | ICD-10-CM

## 2017-03-16 DIAGNOSIS — N6320 Unspecified lump in the left breast, unspecified quadrant: Secondary | ICD-10-CM | POA: Diagnosis not present

## 2017-03-16 DIAGNOSIS — R1012 Left upper quadrant pain: Secondary | ICD-10-CM

## 2017-03-16 MED ORDER — GADOBENATE DIMEGLUMINE 529 MG/ML IV SOLN
13.0000 mL | Freq: Once | INTRAVENOUS | Status: AC | PRN
  Administered 2017-03-16: 13 mL via INTRAVENOUS

## 2017-03-20 ENCOUNTER — Ambulatory Visit (HOSPITAL_BASED_OUTPATIENT_CLINIC_OR_DEPARTMENT_OTHER): Payer: 59 | Admitting: Hematology and Oncology

## 2017-03-20 ENCOUNTER — Telehealth: Payer: Self-pay | Admitting: Hematology and Oncology

## 2017-03-20 DIAGNOSIS — Z170 Estrogen receptor positive status [ER+]: Secondary | ICD-10-CM

## 2017-03-20 DIAGNOSIS — R5383 Other fatigue: Secondary | ICD-10-CM | POA: Diagnosis not present

## 2017-03-20 DIAGNOSIS — C50411 Malignant neoplasm of upper-outer quadrant of right female breast: Secondary | ICD-10-CM

## 2017-03-20 NOTE — Telephone Encounter (Signed)
Attempted to leave a voicemail for the patient regarding upcoming appts. However, their voice mailbox is full. I am sending her a confirmation letter.

## 2017-03-20 NOTE — Progress Notes (Signed)
Patient Care Team: Tonette Bihari, MD as PCP - General (Family Medicine)  DIAGNOSIS:  Encounter Diagnosis  Name Primary?  . Malignant neoplasm of upper-outer quadrant of right breast in female, estrogen receptor positive ([place])     SUMMARY OF ONCOLOGIC HISTORY:   Breast cancer of upper-outer quadrant of right female breast ([place])   11/15/2012 Imaging    DEXA scan: Normal. T-score -0.7      08/10/2015 Breast MRI    Enhancing mass in right breast upper outer quadrant 8 x 5 x 7 mm, no retroareolar right breast abnormality, no abnormality of the site of nipple retraction      08/18/2015 Initial Biopsy    Right breast biopsy: Invasive ductal carcinoma with DCIS, grade 2 with focal necrosis,ER 100%, PR 0%, Ki-67 5%,HER-2 positive ratio 4.52      08/19/2015 Mammogram    Right breast calcifications now seem to span 4 x 2 x 1.5 cm      09/15/2015 Surgery    Rt mastectomy [name]): IDC Grade 2, 0.18 cm, with DCIS, 0/1 LN, ER 100%, PR 0%, HER2 Positive 4.52, T1aN0M0 (Stage 1A)      10/12/2015 - 11/23/2015 Chemotherapy    Herceptin every 3 weeks 3 cycles (based on [facility] trial: Short herceptin protocol)      11/2015 -  Anti-estrogen oral therapy    Anastrazole 1 mg daily stopped 09/04/2016 due to severe fatigue       CHIEF COMPLIANT: Follow-up of recent CT scans in breast MRI  INTERVAL HISTORY: Nichole Wilson is a 56 year old with above-mentioned history of right breast cancer treated with mastectomy Herceptin and currently on anastrozole. She initially had severe fatigue which has improved over time. She noticed nipple changes which we obtain a mammogram and a breast MRI. She also CT scans which were negative for any metastatic disease. The mammogram did not show any evidence of recurrence. The breast MRI did not show any evidence of cancer.  REVIEW OF SYSTEMS:   Constitutional: Denies fevers, chills or abnormal weight loss Eyes: Denies blurriness of vision Ears, nose, mouth,  throat, and face: Denies mucositis or sore throat Respiratory: Denies cough, dyspnea or wheezes Cardiovascular: Denies palpitation, chest discomfort Gastrointestinal:  Denies nausea, heartburn or change in bowel habits Skin: Denies abnormal skin rashes Lymphatics: Denies new lymphadenopathy or easy bruising Neurological:Denies numbness, tingling or new weaknesses Behavioral/Psych: Mood is stable, no new changes  Extremities: No lower extremity edema Breast: Nipple inversion All other systems were reviewed with the patient and are negative.  I have reviewed the past medical history, past surgical history, social history and family history with the patient and they are unchanged from previous note.  ALLERGIES:  is allergic to other.  MEDICATIONS:  Current Outpatient Prescriptions  Medication Sig Dispense Refill  . AMBULATORY NON FORMULARY MEDICATION Bio-6-Plus Take 1 capsule by mouth three times a day with meals    . anastrozole (ARIMIDEX) 1 MG tablet Take 1 mg by mouth daily.    [name] anastrozole (ARIMIDEX) 1 MG tablet Take 1 tablet (1 mg total) by mouth daily. 90 tablet 3  . aspirin 81 MG tablet Take 81 mg by mouth daily.    . Cholecalciferol (VITAMIN D3) 10000 units capsule Take 10,000 Units by mouth daily.    . Flaxseed, Linseed, (FLAX SEED OIL) 1000 MG CAPS Take 1 capsule by mouth 3 (three) times daily with meals.    [name] lisinopril (PRINIVIL,ZESTRIL) 10 MG tablet Take 1 tablet (10 mg total) by mouth  daily. 30 tablet 6   Current Facility-Administered Medications  Medication Dose Route Frequency Provider Last Rate Last Dose  . 0.9 %  sodium chloride infusion  500 mL Intravenous Continuous Gatha Mayer, MD        PHYSICAL EXAMINATION: ECOG PERFORMANCE STATUS: 1 - Symptomatic but completely ambulatory  Vitals:   03/20/17 1148  BP: (!) 149/89  Pulse: 89  Resp: 20  Temp: 98.3 F (36.8 C)  SpO2: 100%   Filed Weights   03/20/17 1148  Weight: 146 lb 12.8 oz (66.6 kg)     GENERAL:alert, no distress and comfortable SKIN: skin color, texture, turgor are normal, no rashes or significant lesions EYES: normal, Conjunctiva are pink and non-injected, sclera clear OROPHARYNX:no exudate, no erythema and lips, buccal mucosa, and tongue normal  NECK: supple, thyroid normal size, non-tender, without nodularity LYMPH:  no palpable lymphadenopathy in the cervical, axillary or inguinal LUNGS: clear to auscultation and percussion with normal breathing effort HEART: regular rate & rhythm and no murmurs and no lower extremity edema ABDOMEN:abdomen soft, non-tender and normal bowel sounds MUSCULOSKELETAL:no cyanosis of digits and no clubbing  NEURO: alert & oriented x 3 with fluent speech, no focal motor/sensory deficits EXTREMITIES: No lower extremity edema  LABORATORY DATA:  I have reviewed the data as listed   Chemistry      Component Value Date/Time   NA 140 03/13/2017 1223   K 4.0 03/13/2017 1223   CL 108 01/13/2016 0544   CO2 28 03/13/2017 1223   BUN 10.8 03/13/2017 1223   CREATININE 0.7 03/13/2017 1223      Component Value Date/Time   CALCIUM 10.0 03/13/2017 1223   ALKPHOS 126 03/13/2017 1223   AST 24 03/13/2017 1223   ALT 35 03/13/2017 1223   BILITOT 0.75 03/13/2017 1223       Lab Results  Component Value Date   WBC 5.9 03/13/2017   HGB 13.1 03/13/2017   HCT 39.2 03/13/2017   MCV 94.7 03/13/2017   PLT 254 03/13/2017   NEUTROABS 2.8 03/13/2017    ASSESSMENT & PLAN:  Breast cancer of upper-outer quadrant of right female breast ([place]) Rt mastectomy 09/15/15: IDC Grade 2, 0.18 cm, with DCIS, 0/1 LN, ER 100%, PR 0%, HER2 Positive 4.52, T1aN0M0 (Stage 1A) Final tumor size: 0.35 cm on biopsy +0.18 cm on final lumpectomy equals 0.53 cm  Treatment plan: 1. shorter duration of anti-HER-2 therapy based upon [facility] trial where they used only 9 weeks of Herceptin treatment. Herceptin q 3 weeks 3 cycles.  2. Adjuvant antiestrogen therapy with  anastrozole 1 mg daily started 11/15/2015, stopped 09/04/2016 due to severe fatigue Resumed March 2018  Nipple inversion:  1. Mammograms 03/02/2017: No suspicious masses breast density category C  2. Breast MRI  03/16/2017: No evidence of any cancer 3. CT abdomen and pelvis: No evidence of metastatic disease: Include diverticulosis, fatty liver  Fatigue: Extensive workup did not identify the cause of the fatigue. Thyroid function tests were normal as well as previous cortisol levels. ACTH was slightly below normal.  Return to clinic in 1 year for follow-up I spent 25 minutes talking to the patient of which more than half was spent in counseling and coordination of care.  No orders of the defined types were placed in this encounter.  The patient has a good understanding of the overall plan. she agrees with it. she will call with any problems that may develop before the next visit here.   Rulon Eisenmenger, MD 03/20/17

## 2017-03-20 NOTE — Assessment & Plan Note (Signed)
Rt mastectomy 09/15/15: IDC Grade 2, 0.18 cm, with DCIS, 0/1 LN, ER 100%, PR 0%, HER2 Positive 4.52, T1aN0M0 (Stage 1A) Final tumor size: 0.35 cm on biopsy +0.18 cm on final lumpectomy equals 0.53 cm  Treatment plan: 1. shorter duration of anti-HER-2 therapy based upon [facility] trial where they used only 9 weeks of Herceptin treatment. Herceptin q 3 weeks 3 cycles.  2. Adjuvant antiestrogen therapy with anastrozole 1 mg daily started 11/15/2015, stopped 09/04/2016 due to severe fatigue  Nipple inversion:  1. Mammograms 03/02/2017: No suspicious masses breast density category C  2. Breast MRI performed 3. CT abdomen and pelvis: No evidence of metastatic disease: Include diverticulosis, fatty liver  Fatigue: Extensive workup did not identify the cause of the fatigue. Thyroid function tests were normal as well as previous cortisol levels. ACTH was slightly below normal.

## 2017-03-27 ENCOUNTER — Encounter: Payer: Self-pay | Admitting: Internal Medicine

## 2017-03-27 ENCOUNTER — Ambulatory Visit (INDEPENDENT_AMBULATORY_CARE_PROVIDER_SITE_OTHER): Payer: 59 | Admitting: Internal Medicine

## 2017-03-27 VITALS — BP 122/88 | HR 102 | Temp 98.6°F | Ht 61.0 in | Wt 147.0 lb

## 2017-03-27 DIAGNOSIS — I10 Essential (primary) hypertension: Secondary | ICD-10-CM

## 2017-03-27 DIAGNOSIS — E119 Type 2 diabetes mellitus without complications: Secondary | ICD-10-CM

## 2017-03-27 DIAGNOSIS — Z0000 Encounter for general adult medical examination without abnormal findings: Secondary | ICD-10-CM

## 2017-03-27 DIAGNOSIS — E782 Mixed hyperlipidemia: Secondary | ICD-10-CM | POA: Diagnosis not present

## 2017-03-27 LAB — POCT GLYCOSYLATED HEMOGLOBIN (HGB A1C): Hemoglobin A1C: 6.1

## 2017-03-27 MED FILL — LISINOPRIL 10 MG TABS: 10 | 90 days supply | Qty: 90 | Fill #0

## 2017-03-27 NOTE — Patient Instructions (Signed)
It was nice seeing you today. Please follow up 6 months.

## 2017-03-27 NOTE — Assessment & Plan Note (Signed)
Well-controlled.  Continue lisinopril. 

## 2017-03-27 NOTE — Assessment & Plan Note (Signed)
A1C 6.1  - diet controlled

## 2017-03-27 NOTE — Assessment & Plan Note (Signed)
Will recheck triglycerides today, improved at last visit  Patient without interest in started an medication

## 2017-03-27 NOTE — Progress Notes (Signed)
   [facility] Kerrin Mo, MD Phone: [phone]  Reason For Visit: 6 month follow up   # CHRONIC DM, Type 2: Diet controlled diabetes, Reports good compliance. Tolerating well w/o side-effects Currently on ACEi / ARB  Lifestyle: as below  Denies polyuria, visual changes, numbness or tingling. Patient sees Dr. Nuala Alpha, opthalmology   CHRONIC HTN: Current Meds - linisopril 10 mg  Reports good compliance, took meds today. Tolerating well, w/o complaints. Lifestyle - increases vegetables, eat less red meat Denies CP, dyspnea, HA, edema, dizziness / lightheadedness  Past Medical History Reviewed problem list.  Medications- reviewed and updated No additions to family history Social history- patient is a non-smoker  Objective: BP 122/88   Pulse (!) 102   Temp 98.6 F (37 C) (Oral)   Ht 5\' 1"  (1.549 m)   Wt 147 lb (66.7 kg)   LMP 11/29/2011   SpO2 98%   BMI 27.78 kg/m  Gen: NAD, alert, cooperative with exam Cardio: regular rate and rhythm, S1S2 heard, no murmurs appreciated Pulm: clear to auscultation bilaterally, no wheezes, rhonchi or rales GI: soft, non-tender, non-distended, bowel sounds present, no hepatomegaly, no splenomegaly Extremities: warm, well perfused, No edema, cyanosis or clubbing;  MSK: Normal gait and station Skin: dry, intact, no rashes or lesions    Assessment/Plan: See problem based a/p  Essential hypertension Well controlled Continue lisinopril   Diabetes ([place]) A1C 6.1  - diet controlled   Hypercholesterolemia with hypertriglyceridemia Will recheck triglycerides today, improved at last visit  Patient without interest in started an medication

## 2017-03-28 LAB — LIPID PANEL
CHOL/HDL RATIO: 5.1 ratio — AB (ref 0.0–4.4)
CHOLESTEROL TOTAL: 254 mg/dL — AB (ref 100–199)
HDL: 50 mg/dL (ref >39)
TRIGLYCERIDES: 416 mg/dL — AB (ref 0–149)

## 2017-04-06 ENCOUNTER — Encounter: Payer: Self-pay | Admitting: Internal Medicine

## 2017-04-09 ENCOUNTER — Telehealth: Payer: Self-pay | Admitting: Internal Medicine

## 2017-04-09 NOTE — Telephone Encounter (Signed)
Cholesterol is elevated. Patient has not wanted to start any medications for elevated cholesterol/ triglyceride in the past and rather treated it with her diet. I tried to gave patient a call to discuss this again. Left a message letting patient know about her results, that she could give the clinical a call back in touch with me with what regarding these results,

## 2017-05-07 ENCOUNTER — Ambulatory Visit (HOSPITAL_BASED_OUTPATIENT_CLINIC_OR_DEPARTMENT_OTHER): Payer: 59

## 2017-05-07 ENCOUNTER — Other Ambulatory Visit: Payer: Self-pay | Admitting: Hematology and Oncology

## 2017-05-07 DIAGNOSIS — K760 Fatty (change of) liver, not elsewhere classified: Secondary | ICD-10-CM | POA: Diagnosis not present

## 2017-05-07 DIAGNOSIS — Z170 Estrogen receptor positive status [ER+]: Principal | ICD-10-CM

## 2017-05-07 DIAGNOSIS — H02841 Edema of right upper eyelid: Secondary | ICD-10-CM | POA: Diagnosis not present

## 2017-05-07 DIAGNOSIS — C50411 Malignant neoplasm of upper-outer quadrant of right female breast: Secondary | ICD-10-CM

## 2017-05-07 DIAGNOSIS — H02411 Mechanical ptosis of right eyelid: Secondary | ICD-10-CM | POA: Diagnosis not present

## 2017-05-07 LAB — PROTIME-INR
INR: 0.9 — ABNORMAL LOW (ref 2.00–3.50)
Protime: 10.8 Seconds (ref 10.6–13.4)

## 2017-05-07 LAB — CBC WITH DIFFERENTIAL/PLATELET
BASO%: 0.3 % (ref 0.0–2.0)
BASOS ABS: 0 10*3/uL (ref 0.0–0.1)
EOS%: 1 % (ref 0.0–7.0)
Eosinophils Absolute: 0.1 10*3/uL (ref 0.0–0.5)
HCT: 38.1 % (ref 34.8–46.6)
HGB: 12.8 g/dL (ref 11.6–15.9)
LYMPH%: 39.4 % (ref 14.0–49.7)
MCH: 31.6 pg (ref 25.1–34.0)
MCHC: 33.6 g/dL (ref 31.5–36.0)
MCV: 94.1 fL (ref 79.5–101.0)
MONO#: 0.5 10*3/uL (ref 0.1–0.9)
MONO%: 7.2 % (ref 0.0–14.0)
NEUT#: 3.3 10*3/uL (ref 1.5–6.5)
NEUT%: 52.1 % (ref 38.4–76.8)
PLATELETS: 252 10*3/uL (ref 145–400)
RBC: 4.05 10*6/uL (ref 3.70–5.45)
RDW: 13.4 % (ref 11.2–14.5)
WBC: 6.2 10*3/uL (ref 3.9–10.3)
lymph#: 2.5 10*3/uL (ref 0.9–3.3)

## 2017-05-08 LAB — APTT: aPTT: 26 s (ref 24–33)

## 2017-06-19 MED FILL — LISINOPRIL 10 MG TABS: 10 | 30 days supply | Qty: 30 | Fill #1

## 2017-06-19 MED FILL — ANASTROZOLE 1 MG TABLET: 1 | 90 days supply | Qty: 90 | Fill #1

## 2017-06-19 MED FILL — RESTASIS 0.05% EYE EMULSION: 0.05 | 90 days supply | Qty: 180 | Fill #0

## 2017-07-16 DIAGNOSIS — H16143 Punctate keratitis, bilateral: Secondary | ICD-10-CM | POA: Diagnosis not present

## 2017-07-16 DIAGNOSIS — H16223 Keratoconjunctivitis sicca, not specified as Sjogren's, bilateral: Secondary | ICD-10-CM | POA: Diagnosis not present

## 2017-07-19 ENCOUNTER — Telehealth: Payer: Self-pay | Admitting: Internal Medicine

## 2017-07-19 NOTE — Telephone Encounter (Signed)
Nichole Wilson came by the office today asking if you would accept her and her daughter Nichole Wilson - DOB: 09/01/1996) as new patient's? Please advise. Thank you!

## 2017-07-19 NOTE — Telephone Encounter (Signed)
Yes, I will see them  

## 2017-07-20 NOTE — Telephone Encounter (Signed)
New patient appointments have been scheduled for them.

## 2017-08-02 ENCOUNTER — Ambulatory Visit (INDEPENDENT_AMBULATORY_CARE_PROVIDER_SITE_OTHER): Payer: No Typology Code available for payment source | Admitting: Internal Medicine

## 2017-08-02 ENCOUNTER — Encounter: Payer: Self-pay | Admitting: Internal Medicine

## 2017-08-02 ENCOUNTER — Other Ambulatory Visit (INDEPENDENT_AMBULATORY_CARE_PROVIDER_SITE_OTHER): Payer: No Typology Code available for payment source

## 2017-08-02 ENCOUNTER — Ambulatory Visit (INDEPENDENT_AMBULATORY_CARE_PROVIDER_SITE_OTHER)
Admission: RE | Admit: 2017-08-02 | Discharge: 2017-08-02 | Disposition: A | Discharge status: Home / Self Care | Payer: No Typology Code available for payment source | Source: Ambulatory Visit | Attending: Internal Medicine | Admitting: Internal Medicine

## 2017-08-02 VITALS — BP 126/68 | HR 68 | Temp 98.1°F | Ht 62.0 in | Wt 144.0 lb

## 2017-08-02 DIAGNOSIS — I10 Essential (primary) hypertension: Secondary | ICD-10-CM

## 2017-08-02 DIAGNOSIS — E782 Mixed hyperlipidemia: Secondary | ICD-10-CM

## 2017-08-02 DIAGNOSIS — R768 Other specified abnormal immunological findings in serum: Secondary | ICD-10-CM | POA: Diagnosis not present

## 2017-08-02 DIAGNOSIS — M25442 Effusion, left hand: Secondary | ICD-10-CM

## 2017-08-02 DIAGNOSIS — R7303 Prediabetes: Secondary | ICD-10-CM | POA: Diagnosis not present

## 2017-08-02 LAB — CBC WITH DIFFERENTIAL/PLATELET
BASOS PCT: 0.6 % (ref 0.0–3.0)
Basophils Absolute: 0 10*3/uL (ref 0.0–0.1)
EOS ABS: 0.1 10*3/uL (ref 0.0–0.7)
Eosinophils Relative: 1.1 % (ref 0.0–5.0)
HEMATOCRIT: 38.5 % (ref 36.0–46.0)
Hemoglobin: 13 g/dL (ref 12.0–15.0)
LYMPHS ABS: 2.9 10*3/uL (ref 0.7–4.0)
LYMPHS PCT: 42.9 % (ref 12.0–46.0)
MCHC: 33.9 g/dL (ref 30.0–36.0)
MCV: 94 fl (ref 78.0–100.0)
Monocytes Absolute: 0.5 10*3/uL (ref 0.1–1.0)
Monocytes Relative: 6.7 % (ref 3.0–12.0)
NEUTROS ABS: 3.3 10*3/uL (ref 1.4–7.7)
NEUTROS PCT: 48.7 % (ref 43.0–77.0)
PLATELETS: 286 10*3/uL (ref 150.0–400.0)
RBC: 4.1 Mil/uL (ref 3.87–5.11)
RDW: 13.4 % (ref 11.5–15.5)
WBC: 6.7 10*3/uL (ref 4.0–10.5)

## 2017-08-02 LAB — SEDIMENTATION RATE: Sed Rate: 13 mm/hr (ref 0–30)

## 2017-08-02 LAB — C-REACTIVE PROTEIN: CRP: 0.2 mg/dL — ABNORMAL LOW (ref 0.5–20.0)

## 2017-08-02 NOTE — Patient Instructions (Signed)

## 2017-08-02 NOTE — Progress Notes (Signed)
Subjective:  Patient ID: Nichole Wilson, female    DOB: [birth year]  Age: 57 y.o. MRN: [id number]  CC: Hypertension   HPI [name] presents for a BP check - she has a several month history of a vague sensation of swelling on the dorsum of her left hand over the MCP joints and over the medial aspect of her left elbow.  There is no discomfort or rash associated with this.  She denies any trauma or injury.  She has a history of dry eyes.  She denies joint pain or swelling.   History [name] has a past medical history of Breast cyst, left (01-04-12), Cancer ([place]), Cholelithiasis with choledocholithiasis, Chronic calculus cholecystitis (5/46/5681), Complication of anesthesia (2002), Diabetes mellitus without complication ([place]), Family history of adverse reaction to anesthesia, Gallstone pancreatitis, Glucose intolerance (impaired glucose tolerance), Hepatic steatosis, Hypercholesterolemia with hypertriglyceridemia, Hypertension (01-04-12), Hypertriglyceridemia, Migraine without aura, without mention of intractable migraine without mention of status migrainosus, PONV (postoperative nausea and vomiting), and Pre-diabetes.   She has a past surgical history that includes Cesarean section; ERCP (12/05/2011); ERCP (12/08/2011); Cholecystectomy (03/25/2012); Mastectomy w/ sentinel node biopsy (Right, 09/15/2015); Esophagogastroduodenoscopy (egd) with propofol (N/A, 09/28/2015); EUS (N/A, 10/13/2015); Colonoscopy; Laparoscopic partial gastrectomy (N/A, 01/11/2016); and Upper gi endoscopy (N/A, 01/11/2016).   Her family history includes Diabetes in her brother, father, and mother; Heart disease in her paternal uncle; Liver disease in her brother.She reports that  has never smoked. she has never used smokeless tobacco. She reports that she does not drink alcohol or use drugs.  Outpatient Medications Prior to Visit  Medication Sig Dispense Refill  . anastrozole (ARIMIDEX) 1 MG tablet Take 1 tablet (1 mg total) by mouth  daily. 90 tablet 3  . aspirin 81 MG tablet Take 81 mg by mouth daily.    . Cholecalciferol (VITAMIN D3) 10000 units capsule Take 10,000 Units by mouth daily.    . AMBULATORY NON FORMULARY MEDICATION Bio-6-Plus Take 1 capsule by mouth three times a day with meals    . anastrozole (ARIMIDEX) 1 MG tablet Take 1 mg by mouth daily.    . Flaxseed, Linseed, (FLAX SEED OIL) 1000 MG CAPS Take 1 capsule by mouth 3 (three) times daily with meals.    [name] lisinopril (PRINIVIL,ZESTRIL) 10 MG tablet Take 1 tablet (10 mg total) by mouth daily. 30 tablet 6  . 0.9 %  sodium chloride infusion      No facility-administered medications prior to visit.     ROS Review of Systems  Constitutional: Negative.  Negative for chills, diaphoresis, fatigue and fever.  HENT: Negative.  Negative for sore throat and trouble swallowing.   Eyes: Negative.  Negative for visual disturbance.  Respiratory: Negative for cough, chest tightness, shortness of breath and wheezing.   Cardiovascular: Negative for chest pain, palpitations and leg swelling.  Gastrointestinal: Negative for abdominal pain, constipation, diarrhea, nausea and vomiting.  Endocrine: Negative.   Genitourinary: Negative.  Negative for difficulty urinating, dysuria and hematuria.  Musculoskeletal: Negative.  Negative for arthralgias, myalgias and neck pain.  Skin: Negative for color change, pallor and rash.  Allergic/Immunologic: Negative.   Neurological: Negative.  Negative for dizziness, weakness and headaches.  Hematological: Negative for adenopathy. Does not bruise/bleed easily.  Psychiatric/Behavioral: Negative.     Objective:  BP 126/68 (BP Location: Left Arm, Patient Position: Sitting, Cuff Size: Normal)   Pulse 68   Temp 98.1 F (36.7 C) (Oral)   Ht 5\' 2"  (1.575 m)   Wt 144 lb (65.3  kg)   LMP 11/29/2011   SpO2 97%   BMI 26.34 kg/m   Physical Exam  Constitutional: She is oriented to person, place, and time. No distress.  HENT:    Mouth/Throat: Oropharynx is clear and moist. No oropharyngeal exudate.  Eyes: Conjunctivae are normal. Right eye exhibits no discharge. Left eye exhibits no discharge. No scleral icterus.  Neck: Normal range of motion. Neck supple. No JVD present. No thyromegaly present.  Cardiovascular: Normal rate, regular rhythm and normal heart sounds. Exam reveals no gallop.  No murmur heard. Pulmonary/Chest: Effort normal and breath sounds normal. No respiratory distress. She has no wheezes. She has no rales.  Abdominal: Soft. Bowel sounds are normal. She exhibits no mass. There is no tenderness. There is no guarding.  Musculoskeletal: Normal range of motion. She exhibits no edema or tenderness.       Left elbow: She exhibits swelling. She exhibits normal range of motion, no effusion and no deformity. No tenderness found.       Arms:      Left hand: She exhibits swelling. She exhibits normal range of motion, no tenderness, no bony tenderness and no deformity. Normal sensation noted. Normal strength noted.       Hands: Lymphadenopathy:    She has no cervical adenopathy.  Neurological: She is alert and oriented to person, place, and time.  Skin: Skin is warm and dry. No rash noted. No erythema. No pallor.  Vitals reviewed.   Lab Results  Component Value Date   WBC 6.7 08/02/2017   HGB 13.0 08/02/2017   HCT 38.5 08/02/2017   PLT 286.0 08/02/2017   GLUCOSE 118 03/13/2017   CHOL 254 (H) 03/27/2017   TRIG 416 (H) 03/27/2017   HDL 50 03/27/2017   LDLDIRECT 108 05/12/2015   LDLCALC Comment 03/27/2017   ALT 35 03/13/2017   AST 24 03/13/2017   NA 140 03/13/2017   K 4.0 03/13/2017   CL 108 01/13/2016   CREATININE 0.7 03/13/2017   BUN 10.8 03/13/2017   CO2 28 03/13/2017   TSH 0.342 08/08/2016   INR 0.90 (L) 05/07/2017   HGBA1C 6.1 03/27/2017    Assessment & Plan:   Zaakirah was seen today for hypertension.  Diagnoses and all orders for this visit:  Hypercholesterolemia with  hypertriglyceridemia-I have asked her to return for a fasting set of lipids to consider whether or not this needs to be treated. -     Lipid panel; Future  Prediabetes- She is working on her lifestyle modifications to lower her blood sugar.  Swelling of joint, hand, left- Examination is remarkable for soft tissue swelling.  Plain films are normal.  Screening for inflammatory arthropathy or rheumatologic disease is negative thus far.  The ANA is pending at this time. -     DG Hand Complete Left; Future -     CBC with Differential/Platelet; Future -     Rheumatoid factor; Future -     ANA; Future -     C-reactive protein; Future -     Sedimentation rate; Future  Essential hypertension- Her blood pressure is adequately well controlled.  I am concerned that the soft tissue swelling may be related to the ACE inhibitor so I have asked her to stop taking lisinopril.   I have discontinued [name]. Carrigan's lisinopril, Flax Seed Oil, and AMBULATORY NON FORMULARY MEDICATION. I am also having her maintain her aspirin, Vitamin D3, and anastrozole. We will stop administering sodium chloride.  No orders of the defined  types were placed in this encounter.    Follow-up: Return in about 3 months (around 10/31/2017).  Scarlette Calico, MD

## 2017-08-03 ENCOUNTER — Encounter: Payer: Self-pay | Admitting: Internal Medicine

## 2017-08-03 LAB — ANTI-NUCLEAR AB-TITER (ANA TITER)

## 2017-08-03 LAB — RHEUMATOID FACTOR: Rhuematoid fact SerPl-aCnc: <14 IU/mL (ref <14)

## 2017-08-03 LAB — ANA: ANA: POSITIVE — AB

## 2017-08-06 ENCOUNTER — Encounter: Payer: Self-pay | Admitting: Internal Medicine

## 2017-08-06 DIAGNOSIS — R768 Other specified abnormal immunological findings in serum: Secondary | ICD-10-CM

## 2017-08-06 HISTORY — DX: Other specified abnormal immunological findings in serum: R76.8

## 2017-08-17 ENCOUNTER — Encounter: Payer: Self-pay | Admitting: Internal Medicine

## 2017-08-17 ENCOUNTER — Other Ambulatory Visit (INDEPENDENT_AMBULATORY_CARE_PROVIDER_SITE_OTHER): Payer: No Typology Code available for payment source

## 2017-08-17 DIAGNOSIS — E782 Mixed hyperlipidemia: Secondary | ICD-10-CM | POA: Diagnosis not present

## 2017-08-17 LAB — LIPID PANEL
CHOLESTEROL: 232 mg/dL — AB (ref 0–200)
HDL: 59.4 mg/dL (ref >39.00)
NonHDL: 172.82
TRIGLYCERIDES: 356 mg/dL — AB (ref 0.0–149.0)
Total CHOL/HDL Ratio: 4
VLDL: 71.2 mg/dL — AB (ref 0.0–40.0)

## 2017-08-17 LAB — LDL CHOLESTEROL, DIRECT: LDL DIRECT: 122 mg/dL

## 2017-08-21 ENCOUNTER — Other Ambulatory Visit: Payer: Self-pay | Admitting: Hematology and Oncology

## 2017-08-21 ENCOUNTER — Other Ambulatory Visit: Payer: Self-pay

## 2017-08-21 ENCOUNTER — Inpatient Hospital Stay: Payer: No Typology Code available for payment source | Attending: Hematology and Oncology

## 2017-08-21 DIAGNOSIS — R0781 Pleurodynia: Secondary | ICD-10-CM

## 2017-08-21 DIAGNOSIS — C50411 Malignant neoplasm of upper-outer quadrant of right female breast: Secondary | ICD-10-CM

## 2017-08-21 DIAGNOSIS — K760 Fatty (change of) liver, not elsewhere classified: Secondary | ICD-10-CM | POA: Insufficient documentation

## 2017-08-21 LAB — CMP (CANCER CENTER ONLY)
ALBUMIN: 4 g/dL (ref 3.5–5.0)
ALK PHOS: 124 U/L (ref 40–150)
ALT: 22 U/L (ref 0–55)
AST: 17 U/L (ref 5–34)
Anion gap: 9 (ref 3–11)
BILIRUBIN TOTAL: 0.4 mg/dL (ref 0.2–1.2)
BUN: 10 mg/dL (ref 7–26)
CALCIUM: 9.3 mg/dL (ref 8.4–10.4)
CO2: 27 mmol/L (ref 22–29)
Chloride: 104 mmol/L (ref 98–109)
Creatinine: 0.72 mg/dL (ref 0.60–1.10)
GFR, Est AFR Am: >60 mL/min (ref >60)
GLUCOSE: 143 mg/dL — AB (ref 70–140)
Potassium: 4.1 mmol/L (ref 3.5–5.1)
Sodium: 140 mmol/L (ref 136–145)
TOTAL PROTEIN: 7.7 g/dL (ref 6.4–8.3)

## 2017-08-21 LAB — CK TOTAL AND CKMB (NOT AT ARMC)
CK TOTAL: 50 U/L (ref 38–234)
CK, MB: 0.7 ng/mL (ref 0.5–5.0)
RELATIVE INDEX: INVALID (ref 0.0–2.5)

## 2017-08-21 LAB — CBC WITH DIFFERENTIAL (CANCER CENTER ONLY)
BASOS ABS: 0 10*3/uL (ref 0.0–0.1)
BASOS PCT: 0 %
EOS ABS: 0.1 10*3/uL (ref 0.0–0.5)
EOS PCT: 1 %
HCT: 36.3 % (ref 34.8–46.6)
Hemoglobin: 12.6 g/dL (ref 11.6–15.9)
LYMPHS PCT: 41 %
Lymphs Abs: 2.2 10*3/uL (ref 0.9–3.3)
MCH: 32.3 pg (ref 25.1–34.0)
MCHC: 34.6 g/dL (ref 31.5–36.0)
MCV: 93.5 fL (ref 79.5–101.0)
MONO ABS: 0.4 10*3/uL (ref 0.1–0.9)
Monocytes Relative: 7 %
Neutro Abs: 2.7 10*3/uL (ref 1.5–6.5)
Neutrophils Relative %: 51 %
Platelet Count: 258 10*3/uL (ref 145–400)
RBC: 3.89 MIL/uL (ref 3.70–5.45)
RDW: 13.1 % (ref 11.2–14.5)
WBC: 5.4 10*3/uL (ref 3.9–10.3)

## 2017-08-21 LAB — TROPONIN I: Troponin I: <0.03 ng/mL (ref <0.03)

## 2017-08-23 ENCOUNTER — Encounter: Payer: Self-pay | Admitting: Internal Medicine

## 2017-08-23 ENCOUNTER — Other Ambulatory Visit: Payer: No Typology Code available for payment source

## 2017-08-23 ENCOUNTER — Ambulatory Visit (INDEPENDENT_AMBULATORY_CARE_PROVIDER_SITE_OTHER): Payer: No Typology Code available for payment source | Admitting: Internal Medicine

## 2017-08-23 VITALS — BP 130/80 | HR 101 | Temp 98.8°F | Resp 16 | Ht 62.0 in | Wt 147.8 lb

## 2017-08-23 DIAGNOSIS — E782 Mixed hyperlipidemia: Secondary | ICD-10-CM

## 2017-08-23 DIAGNOSIS — E781 Pure hyperglyceridemia: Secondary | ICD-10-CM

## 2017-08-23 DIAGNOSIS — I10 Essential (primary) hypertension: Secondary | ICD-10-CM

## 2017-08-23 DIAGNOSIS — R768 Other specified abnormal immunological findings in serum: Secondary | ICD-10-CM | POA: Diagnosis not present

## 2017-08-23 DIAGNOSIS — R7689 Other specified abnormal immunological findings in serum: Secondary | ICD-10-CM

## 2017-08-23 MED ORDER — ICOSAPENT ETHYL 1 G PO CAPS: 2.0000 | ORAL_CAPSULE | Freq: Two times a day (BID) | ORAL | 1 refills | Status: DC

## 2017-08-23 MED ORDER — NEBIVOLOL HCL 5 MG PO TABS
5.0000 mg | ORAL_TABLET | Freq: Every day | ORAL | 0 refills | Status: DC
Start: 2017-08-23 — End: 2018-03-22

## 2017-08-23 NOTE — Progress Notes (Signed)
Subjective:  Patient ID: Nichole Wilson, female    DOB: 1960/11/22  Age: 57 y.o. MRN: [id number]  CC: Hypertension and Hyperlipidemia   HPI Nichole Wilson presents for f/up -  when I last saw her I asked her to stop taking lisinopril.  She became concerned that her blood pressure was as high as 146/90 so she restarted the lisinopril.  Again, she complains of puffy, red, swollen areas on the palmar surfaces of both hands over the prominences of the MCP joints as well as the thenar and hypothenar eminences.  The areas do not itch and are not painful.  She also has a history of dry eyes and a mildly positive ANA.  She wants to be screened for rheumatologic disease.  Also, she is concerned about her hypertriglyceridemia.  She has tried to improve her lifestyle modifications but her recent fasting triglycerides were 356.  Outpatient Medications Prior to Visit  Medication Sig Dispense Refill  . anastrozole (ARIMIDEX) 1 MG tablet Take 1 tablet (1 mg total) by mouth daily. 90 tablet 3  . aspirin 81 MG tablet Take 81 mg by mouth daily.    . Cholecalciferol (VITAMIN D3) 10000 units capsule Take 10,000 Units by mouth daily.    [name] lisinopril (PRINIVIL,ZESTRIL) 10 MG tablet Take 10 mg by mouth daily.     No facility-administered medications prior to visit.     ROS Review of Systems  Constitutional: Negative.  Negative for diaphoresis and fatigue.  HENT: Negative.   Eyes: Negative.  Negative for photophobia and visual disturbance.  Respiratory: Negative for cough, chest tightness and shortness of breath.   Cardiovascular: Negative for chest pain, palpitations and leg swelling.  Gastrointestinal: Negative for abdominal pain, constipation, diarrhea, nausea and vomiting.  Endocrine: Negative.   Genitourinary: Negative.  Negative for difficulty urinating.  Musculoskeletal: Negative.  Negative for back pain, myalgias and neck pain.  Skin: Positive for color change. Negative for rash and wound.    Allergic/Immunologic: Negative.   Neurological: Negative.  Negative for dizziness, weakness and light-headedness.  Hematological: Negative for adenopathy. Does not bruise/bleed easily.  Psychiatric/Behavioral: Negative.     Objective:  BP 130/80 (BP Location: Left Arm, Patient Position: Sitting, Cuff Size: Normal)   Pulse (!) 101   Temp 98.8 F (37.1 C) (Oral)   Resp 16   Ht 5\' 2"  (1.575 m)   Wt 147 lb 12 oz (67 kg)   LMP 11/29/2011   SpO2 97%   BMI 27.02 kg/m   BP Readings from Last 3 Encounters:  08/23/17 130/80  08/02/17 126/68  03/27/17 122/88    Wt Readings from Last 3 Encounters:  08/23/17 147 lb 12 oz (67 kg)  08/02/17 144 lb (65.3 kg)  03/27/17 147 lb (66.7 kg)    Physical Exam  Constitutional: She is oriented to person, place, and time. No distress.  HENT:  Mouth/Throat: Oropharynx is clear and moist. No oropharyngeal exudate.  Eyes: Conjunctivae are normal. Left eye exhibits no discharge. No scleral icterus.  Neck: Normal range of motion. Neck supple. No JVD present. No thyromegaly present.  Cardiovascular: Normal rate, regular rhythm and normal heart sounds. Exam reveals no gallop.  No murmur heard. Pulmonary/Chest: Effort normal and breath sounds normal. No respiratory distress. She has no wheezes. She has no rales.  Abdominal: Soft. Bowel sounds are normal. She exhibits no distension and no mass. There is no tenderness.  Musculoskeletal: Normal range of motion. She exhibits no edema, tenderness or deformity.  Lymphadenopathy:  She has no cervical adenopathy.  Neurological: She is alert and oriented to person, place, and time.  Skin: Skin is warm and dry. No rash noted. She is not diaphoretic. No erythema. No pallor.  Symmetrically, on the palmar surfaces of both hands there is mild soft tissue swelling and lacy erythema over the prominences of the MCP joints, thenar eminences, and hypothenar eminences.  Vitals reviewed.   Lab Results  Component  Value Date   WBC 5.4 08/21/2017   HGB 13.0 08/02/2017   HCT 36.3 08/21/2017   PLT 258 08/21/2017   GLUCOSE 143 (H) 08/21/2017   CHOL 232 (H) 08/17/2017   TRIG 356.0 (H) 08/17/2017   HDL 59.40 08/17/2017   LDLDIRECT 122.0 08/17/2017   LDLCALC Comment 03/27/2017   ALT 22 08/21/2017   AST 17 08/21/2017   NA 140 08/21/2017   K 4.1 08/21/2017   CL 104 08/21/2017   CREATININE 0.72 08/21/2017   BUN 10 08/21/2017   CO2 27 08/21/2017   TSH 0.342 08/08/2016   INR 0.90 (L) 05/07/2017   HGBA1C 6.1 03/27/2017    Dg Hand Complete Left  Result Date: 08/03/2017 CLINICAL DATA:  Left hand swelling, pain EXAM: LEFT HAND - COMPLETE 3+ VIEW COMPARISON:  None. FINDINGS: There is no evidence of fracture or dislocation. There is no evidence of arthropathy or other focal bone abnormality. Soft tissues are unremarkable. IMPRESSION: No acute osseous injury of the left hand. Electronically Signed   By: Kathreen Devoid   On: 08/03/2017 08:53    Assessment & Plan:   Nichole Wilson was seen today for hypertension and hyperlipidemia.  Diagnoses and all orders for this visit:  ANA positive- She has a paucity of symptoms.  Follow-up screening with anti-Smith antibody, anti-DNA antibody, and SSA and SSB are all negative.  At this time, I do not think she has rheumatologic disease. -     Anti-Smith antibody; Future -     Anti-DNA antibody, double-stranded; Future -     Sjogrens syndrome-A extractable nuclear antibody; Future -     Sjogrens syndrome-B extractable nuclear antibody; Future  Essential hypertension- I am concerned the ACE inhibitor is causing the symptoms in her hands.  I have asked her to stop taking it.  We will try to control her blood pressure with nebivolol. -     nebivolol (BYSTOLIC) 5 MG tablet; Take 1 tablet (5 mg total) by mouth daily.  Hypercholesterolemia with hypertriglyceridemia  Pure hyperglyceridemia- Will start an omega-3 fish oil to reduce the risk of complications such as pancreatitis.    -     Icosapent Ethyl (VASCEPA) 1 g CAPS; Take 2 capsules by mouth 2 (two) times daily.   I have discontinued Nichole Wilson. Nichole Wilson "Nichole Wilson"'s lisinopril. I am also having her start on nebivolol and Icosapent Ethyl. Additionally, I am having her maintain her aspirin, Vitamin D3, and anastrozole.  Meds ordered this encounter  Medications  . nebivolol (BYSTOLIC) 5 MG tablet    Sig: Take 1 tablet (5 mg total) by mouth daily.    Dispense:  90 tablet    Refill:  0  . Icosapent Ethyl (VASCEPA) 1 g CAPS    Sig: Take 2 capsules by mouth 2 (two) times daily.    Dispense:  360 capsule    Refill:  1     Follow-up: Return in about 3 months (around 11/20/2017).  Scarlette Calico, MD

## 2017-08-23 NOTE — Patient Instructions (Signed)

## 2017-08-24 LAB — SJOGRENS SYNDROME-A EXTRACTABLE NUCLEAR ANTIBODY: SSA (RO) (ENA) ANTIBODY, IGG: NEGATIVE AI

## 2017-08-24 LAB — ANTI-SMITH ANTIBODY: ENA SM Ab Ser-aCnc: <1 AI

## 2017-08-24 LAB — SJOGRENS SYNDROME-B EXTRACTABLE NUCLEAR ANTIBODY: SSB (LA) (ENA) ANTIBODY, IGG: NEGATIVE AI

## 2017-08-24 LAB — ANTI-DNA ANTIBODY, DOUBLE-STRANDED

## 2017-08-25 ENCOUNTER — Encounter: Payer: Self-pay | Admitting: Internal Medicine

## 2017-09-07 MED FILL — VASCEPA 1 GM CAPSULE: 1 | 90 days supply | Qty: 360 | Fill #0

## 2017-10-30 MED FILL — ANASTROZOLE 1 MG TABLET: 1 | 90 days supply | Qty: 90 | Fill #2

## 2017-11-01 ENCOUNTER — Ambulatory Visit: Payer: No Typology Code available for payment source | Admitting: Internal Medicine

## 2017-11-07 ENCOUNTER — Ambulatory Visit: Payer: No Typology Code available for payment source | Admitting: Internal Medicine

## 2017-11-07 DIAGNOSIS — Z2089 Contact with and (suspected) exposure to other communicable diseases: Secondary | ICD-10-CM

## 2017-12-12 MED FILL — RESTASIS 0.05% EYE EMULSION: 0.05 | 90 days supply | Qty: 180 | Fill #0

## 2017-12-13 ENCOUNTER — Ambulatory Visit (INDEPENDENT_AMBULATORY_CARE_PROVIDER_SITE_OTHER): Payer: No Typology Code available for payment source | Admitting: Internal Medicine

## 2017-12-13 ENCOUNTER — Other Ambulatory Visit (INDEPENDENT_AMBULATORY_CARE_PROVIDER_SITE_OTHER): Payer: No Typology Code available for payment source

## 2017-12-13 ENCOUNTER — Encounter: Payer: Self-pay | Admitting: Internal Medicine

## 2017-12-13 VITALS — BP 118/80 | HR 96 | Temp 98.4°F | Resp 16 | Ht 62.0 in | Wt 157.8 lb

## 2017-12-13 DIAGNOSIS — R7303 Prediabetes: Secondary | ICD-10-CM

## 2017-12-13 DIAGNOSIS — I10 Essential (primary) hypertension: Secondary | ICD-10-CM | POA: Diagnosis not present

## 2017-12-13 DIAGNOSIS — E118 Type 2 diabetes mellitus with unspecified complications: Secondary | ICD-10-CM

## 2017-12-13 DIAGNOSIS — E782 Mixed hyperlipidemia: Secondary | ICD-10-CM

## 2017-12-13 DIAGNOSIS — E781 Pure hyperglyceridemia: Secondary | ICD-10-CM | POA: Diagnosis not present

## 2017-12-13 LAB — HEMOGLOBIN A1C: HEMOGLOBIN A1C: 6.5 % (ref 4.6–6.5)

## 2017-12-13 NOTE — Patient Instructions (Signed)

## 2017-12-13 NOTE — Progress Notes (Signed)
Subjective:  Patient ID: Nichole Wilson, female    DOB: 04-Sep-1960  Age: 57 y.o. MRN: [id number]  CC: Hypertension and Hyperlipidemia   HPI Nichole Wilson presents for f/up - She has not made any improvements in her lifestyle modifications and in fact has gained weight.  She tells me her blood pressure has been well controlled.  She denies any recent episodes of headache/blurred vision/chest pain/shortness of breath/palpitations/edema/fatigue.  Outpatient Medications Prior to Visit  Medication Sig Dispense Refill  . anastrozole (ARIMIDEX) 1 MG tablet Take 1 tablet (1 mg total) by mouth daily. 90 tablet 3  . aspirin 81 MG tablet Take 81 mg by mouth daily.    . Cholecalciferol (VITAMIN D3) 10000 units capsule Take 10,000 Units by mouth daily.    Nichole Wilson Ethyl (VASCEPA) 1 g CAPS Take 2 capsules by mouth 2 (two) times daily. 360 capsule 1  . nebivolol (BYSTOLIC) 5 MG tablet Take 1 tablet (5 mg total) by mouth daily. 90 tablet 0  . RESTASIS 0.05 % ophthalmic emulsion   2   No facility-administered medications prior to visit.     ROS Review of Systems  Constitutional: Positive for unexpected weight change (wt gain). Negative for diaphoresis and fatigue.  HENT: Negative.   Eyes: Negative.   Respiratory: Negative for apnea, cough, shortness of breath and wheezing.   Cardiovascular: Negative for chest pain, palpitations and leg swelling.  Gastrointestinal: Negative for abdominal pain, diarrhea, nausea and vomiting.  Endocrine: Negative.  Negative for polydipsia, polyphagia and polyuria.  Genitourinary: Negative.  Negative for difficulty urinating.  Musculoskeletal: Negative.  Negative for arthralgias and myalgias.  Skin: Negative.   Neurological: Negative.  Negative for dizziness, weakness and light-headedness.  Hematological: Negative for adenopathy. Does not bruise/bleed easily.  Psychiatric/Behavioral: Negative.     Objective:  BP 118/80 (BP Location: Left Arm, Patient Position:  Sitting, Cuff Size: Large)   Pulse 96   Temp 98.4 F (36.9 C) (Oral)   Resp 16   Ht 5\' 2"  (1.575 m)   Wt 157 lb 12 oz (71.6 kg)   LMP 11/29/2011   SpO2 97%   BMI 28.85 kg/m   BP Readings from Last 3 Encounters:  12/13/17 118/80  08/23/17 130/80  08/02/17 126/68    Wt Readings from Last 3 Encounters:  12/13/17 157 lb 12 oz (71.6 kg)  08/23/17 147 lb 12 oz (67 kg)  08/02/17 144 lb (65.3 kg)    Physical Exam  Constitutional: She is oriented to person, place, and time. No distress.  HENT:  Mouth/Throat: Oropharynx is clear and moist. No oropharyngeal exudate.  Eyes: Conjunctivae are normal. No scleral icterus.  Neck: Normal range of motion. Neck supple. No JVD present. No thyromegaly present.  Cardiovascular: Normal rate, regular rhythm and normal heart sounds.  No murmur heard. Pulmonary/Chest: Effort normal and breath sounds normal. She has no wheezes. She has no rales.  Abdominal: Soft. Normal appearance and bowel sounds are normal. She exhibits no mass. There is no hepatosplenomegaly. There is no tenderness.  Musculoskeletal: Normal range of motion. She exhibits no edema, tenderness or deformity.  Lymphadenopathy:    She has no cervical adenopathy.  Neurological: She is alert and oriented to person, place, and time.  Skin: Skin is warm and dry. She is not diaphoretic.  Psychiatric: She has a normal mood and affect. Her behavior is normal. Judgment and thought content normal.  Vitals reviewed.   Lab Results  Component Value Date   WBC 5.4 08/21/2017  HGB 12.6 08/21/2017   HCT 36.3 08/21/2017   PLT 258 08/21/2017   GLUCOSE 177 (H) 12/13/2017   CHOL 232 (H) 08/17/2017   TRIG 997.0 (H) 12/13/2017   HDL 59.40 08/17/2017   LDLDIRECT 122.0 08/17/2017   LDLCALC Comment 03/27/2017   ALT 22 08/21/2017   AST 17 08/21/2017   NA 139 12/13/2017   K 4.2 12/13/2017   CL 100 12/13/2017   CREATININE 0.73 12/13/2017   BUN 13 12/13/2017   CO2 25 12/13/2017   TSH 0.342  08/08/2016   INR 0.90 (L) 05/07/2017   HGBA1C 6.5 12/13/2017    Dg Hand Complete Left  Result Date: 08/03/2017 CLINICAL DATA:  Left hand swelling, pain EXAM: LEFT HAND - COMPLETE 3+ VIEW COMPARISON:  None. FINDINGS: There is no evidence of fracture or dislocation. There is no evidence of arthropathy or other focal bone abnormality. Soft tissues are unremarkable. IMPRESSION: No acute osseous injury of the left hand. Electronically Signed   By: Kathreen Devoid   On: 08/03/2017 08:53    Assessment & Plan:   Nichole Wilson was seen today for hypertension and hyperlipidemia.  Diagnoses and all orders for this visit:  Prediabetes- Her A1c has risen to 6.5%.  She has new onset type 2 diabetes mellitus.  Medical therapy is not indicated.  She was encouraged to improve her lifestyle modifications. -     Basic metabolic panel; Future -     Hemoglobin A1c; Future  Essential hypertension- Her blood pressure is adequately well ontrol. -     Basic metabolic panel; Future  Hypercholesterolemia with hypertriglyceridemia- Her triglycerides are up to 997.  It appears she is not compliant with vascepa.  I have asked her to restart vascepa and to improve her lifestyle modifications and avoid alcohol intake. -     Triglycerides; Future  Type 2 diabetes mellitus with complication, without long-term current use of insulin ([place])- As above.   I am having Nichole Wilson. Manuelito [name] maintain her aspirin, Vitamin D3, anastrozole, nebivolol, Icosapent Ethyl, and RESTASIS.  No orders of the defined types were placed in this encounter.    Follow-up: Return in about 6 months (around 06/15/2018).  Scarlette Calico, MD

## 2017-12-14 LAB — BASIC METABOLIC PANEL
BUN: 13 mg/dL (ref 6–23)
CHLORIDE: 100 meq/L (ref 96–112)
CO2: 25 mEq/L (ref 19–32)
CREATININE: 0.73 mg/dL (ref 0.40–1.20)
Calcium: 9.7 mg/dL (ref 8.4–10.5)
GFR: 87.98 mL/min (ref >60.00)
Glucose, Bld: 177 mg/dL — ABNORMAL HIGH (ref 70–99)
Potassium: 4.2 mEq/L (ref 3.5–5.1)
Sodium: 139 mEq/L (ref 135–145)

## 2017-12-14 LAB — TRIGLYCERIDES: Triglycerides: 997 mg/dL — ABNORMAL HIGH (ref 0.0–149.0)

## 2017-12-15 ENCOUNTER — Encounter: Payer: Self-pay | Admitting: Internal Medicine

## 2017-12-15 MED ORDER — ICOSAPENT ETHYL 1 G PO CAPS: 2.0000 | ORAL_CAPSULE | Freq: Two times a day (BID) | ORAL | 1 refills | Status: DC

## 2018-02-15 ENCOUNTER — Other Ambulatory Visit: Payer: Self-pay | Admitting: General Surgery

## 2018-02-15 DIAGNOSIS — N6320 Unspecified lump in the left breast, unspecified quadrant: Secondary | ICD-10-CM

## 2018-02-20 ENCOUNTER — Other Ambulatory Visit: Payer: Self-pay | Admitting: General Surgery

## 2018-02-20 ENCOUNTER — Other Ambulatory Visit: Payer: Self-pay

## 2018-02-20 DIAGNOSIS — N6320 Unspecified lump in the left breast, unspecified quadrant: Secondary | ICD-10-CM

## 2018-02-22 ENCOUNTER — Ambulatory Visit
Admission: RE | Admit: 2018-02-22 | Discharge: 2018-02-22 | Disposition: A | Discharge status: Home / Self Care | Payer: No Typology Code available for payment source | Source: Ambulatory Visit | Attending: General Surgery | Admitting: General Surgery

## 2018-02-22 DIAGNOSIS — N6320 Unspecified lump in the left breast, unspecified quadrant: Secondary | ICD-10-CM

## 2018-03-20 ENCOUNTER — Inpatient Hospital Stay
Payer: No Typology Code available for payment source | Attending: Hematology and Oncology | Admitting: Hematology and Oncology

## 2018-03-20 NOTE — Assessment & Plan Note (Deleted)
Rt mastectomy 09/15/15: IDC Grade 2, 0.18 cm, with DCIS, 0/1 LN, ER 100%, PR 0%, HER2 Positive 4.52, [id number] (Stage 1A) Final tumor size: 0.35 cm on biopsy +0.18 cm on final lumpectomy equals 0.53 cm  Treatment plan: 1. shorter duration of anti-HER-2 therapy based upon [facility] trial where they used only 9 weeks of Herceptin treatment. Herceptin q 3 weeks 3 cycles.  2. Adjuvant antiestrogen therapy with anastrozole 1 mg daily started 11/15/2015, stopped 09/04/2016 due to severe fatigue Resumed March 2018  Anastrozole toxicities:  Breast cancer surveillance: 1.  Breast exam 03/20/2018: Benign 2. mammogram 02/22/2018: Benign breast density category C, benign-appearing masses at 2 o'clock position of breast  Return to clinic in 1 year for follow-up

## 2018-03-22 ENCOUNTER — Other Ambulatory Visit: Payer: Self-pay | Admitting: Emergency Medicine

## 2018-03-22 DIAGNOSIS — I10 Essential (primary) hypertension: Secondary | ICD-10-CM

## 2018-03-22 MED ORDER — NEBIVOLOL HCL 5 MG PO TABS: 5.0000 mg | ORAL_TABLET | Freq: Every day | ORAL | 1 refills | Status: DC

## 2018-04-18 MED FILL — VASCEPA 1 GM CAPSULE: 1 | 90 days supply | Qty: 360 | Fill #0

## 2018-04-23 ENCOUNTER — Other Ambulatory Visit: Payer: Self-pay | Admitting: Hematology and Oncology

## 2018-04-23 DIAGNOSIS — C50411 Malignant neoplasm of upper-outer quadrant of right female breast: Secondary | ICD-10-CM

## 2018-04-24 ENCOUNTER — Other Ambulatory Visit: Payer: Self-pay | Admitting: Hematology and Oncology

## 2018-04-24 DIAGNOSIS — C50411 Malignant neoplasm of upper-outer quadrant of right female breast: Secondary | ICD-10-CM

## 2018-04-24 MED ORDER — ANASTROZOLE 1 MG PO TABS: 1.0000 mg | ORAL_TABLET | Freq: Every day | ORAL | 3 refills | Status: DC

## 2018-04-24 MED FILL — ANASTROZOLE 1 MG TABLET: 1 | 90 days supply | Qty: 90 | Fill #0

## 2018-04-26 ENCOUNTER — Telehealth: Payer: Self-pay | Admitting: Internal Medicine

## 2018-04-26 DIAGNOSIS — I10 Essential (primary) hypertension: Secondary | ICD-10-CM

## 2018-04-26 MED ORDER — NEBIVOLOL HCL 5 MG PO TABS: 5.0000 mg | ORAL_TABLET | Freq: Every day | ORAL | 1 refills | Status: DC

## 2018-04-26 NOTE — Telephone Encounter (Signed)
Pt would like an actual RX sent to the pharmacy. For BYSTOLIC

## 2018-04-26 NOTE — Addendum Note (Signed)
Addended by: Karle Barr on: 04/26/2018 03:23 PM   Modules accepted: Orders

## 2018-04-26 NOTE — Telephone Encounter (Signed)
I will send rx to [facility] but pt is due for follow up in November. Can you have her schedule?

## 2018-04-29 NOTE — Telephone Encounter (Signed)
LVM for patient to make appt in November with PCP

## 2018-05-15 ENCOUNTER — Encounter: Payer: Self-pay | Admitting: Internal Medicine

## 2018-05-15 ENCOUNTER — Ambulatory Visit (INDEPENDENT_AMBULATORY_CARE_PROVIDER_SITE_OTHER): Payer: No Typology Code available for payment source | Admitting: Internal Medicine

## 2018-05-15 ENCOUNTER — Other Ambulatory Visit (INDEPENDENT_AMBULATORY_CARE_PROVIDER_SITE_OTHER): Payer: No Typology Code available for payment source

## 2018-05-15 VITALS — BP 124/86 | HR 86 | Temp 98.0°F | Resp 16 | Ht 62.0 in | Wt 154.2 lb

## 2018-05-15 DIAGNOSIS — E782 Mixed hyperlipidemia: Secondary | ICD-10-CM

## 2018-05-15 DIAGNOSIS — I10 Essential (primary) hypertension: Secondary | ICD-10-CM | POA: Diagnosis not present

## 2018-05-15 DIAGNOSIS — E781 Pure hyperglyceridemia: Secondary | ICD-10-CM

## 2018-05-15 DIAGNOSIS — E118 Type 2 diabetes mellitus with unspecified complications: Secondary | ICD-10-CM | POA: Diagnosis not present

## 2018-05-15 LAB — COMPREHENSIVE METABOLIC PANEL
ALBUMIN: 4.4 g/dL (ref 3.5–5.2)
ALT: 40 U/L — AB (ref 0–35)
AST: 30 U/L (ref 0–37)
Alkaline Phosphatase: 119 U/L — ABNORMAL HIGH (ref 39–117)
BILIRUBIN TOTAL: 0.7 mg/dL (ref 0.2–1.2)
BUN: 13 mg/dL (ref 6–23)
CALCIUM: 9.8 mg/dL (ref 8.4–10.5)
CHLORIDE: 103 meq/L (ref 96–112)
CO2: 24 mEq/L (ref 19–32)
CREATININE: 0.6 mg/dL (ref 0.40–1.20)
GFR: 110.15 mL/min (ref >60.00)
Glucose, Bld: 156 mg/dL — ABNORMAL HIGH (ref 70–99)
Potassium: 3.7 mEq/L (ref 3.5–5.1)
SODIUM: 138 meq/L (ref 135–145)
TOTAL PROTEIN: 7.6 g/dL (ref 6.0–8.3)

## 2018-05-15 LAB — URINALYSIS, ROUTINE W REFLEX MICROSCOPIC
BILIRUBIN URINE: NEGATIVE
Hgb urine dipstick: NEGATIVE
Ketones, ur: NEGATIVE
LEUKOCYTES UA: NEGATIVE
Nitrite: NEGATIVE
PH: 6.5 (ref 5.0–8.0)
RBC / HPF: NONE SEEN (ref 0–?)
SPECIFIC GRAVITY, URINE: 1.02 (ref 1.000–1.030)
TOTAL PROTEIN, URINE-UPE24: NEGATIVE
Urine Glucose: NEGATIVE
Urobilinogen, UA: 0.2 (ref 0.0–1.0)

## 2018-05-15 LAB — LIPID PANEL
CHOL/HDL RATIO: 5
Cholesterol: 252 mg/dL — ABNORMAL HIGH (ref 0–200)
HDL: 48.8 mg/dL (ref >39.00)
NONHDL: 203.57
TRIGLYCERIDES: 356 mg/dL — AB (ref 0.0–149.0)
VLDL: 71.2 mg/dL — ABNORMAL HIGH (ref 0.0–40.0)

## 2018-05-15 LAB — MICROALBUMIN / CREATININE URINE RATIO
CREATININE, U: 89 mg/dL
MICROALB/CREAT RATIO: 0.8 mg/g (ref 0.0–30.0)

## 2018-05-15 LAB — LDL CHOLESTEROL, DIRECT: LDL DIRECT: 132 mg/dL

## 2018-05-15 LAB — HEMOGLOBIN A1C: Hgb A1c MFr Bld: 6.5 % (ref 4.6–6.5)

## 2018-05-15 MED ORDER — AZILSARTAN-CHLORTHALIDONE 40-12.5 MG PO TABS: 1.0000 | ORAL_TABLET | Freq: Every day | ORAL | 1 refills | Status: DC

## 2018-05-15 MED ORDER — ATORVASTATIN CALCIUM 20 MG PO TABS: 20.0000 mg | ORAL_TABLET | Freq: Every day | ORAL | 1 refills | Status: DC

## 2018-05-15 MED ORDER — NEBIVOLOL HCL 5 MG PO TABS: 5.0000 mg | ORAL_TABLET | Freq: Every day | ORAL | 1 refills | Status: DC

## 2018-05-15 NOTE — Progress Notes (Signed)
Subjective:  Patient ID: Nichole Wilson, female    DOB: Sep 14, 1960  Age: 57 y.o. MRN: [id number]  CC: Hypertension; Diabetes; and Hyperlipidemia   HPI Nichole Wilson presents for f/up - She complains that her blood pressure has not been well controlled.  She ran out of Bystolic about 2 weeks ago.  Since then her blood pressure at home has spiked up to 150-160/90-95.  She found some hydrochlorothiazide she had leftover so she has been taking it.  With the elevated blood pressure she has had a few episodes of headache and dizziness.  Outpatient Medications Prior to Visit  Medication Sig Dispense Refill  . anastrozole (ARIMIDEX) 1 MG tablet TAKE 1 TABLET BY MOUTH DAILY. 90 tablet 0  . aspirin 81 MG tablet Take 81 mg by mouth daily.    . Cholecalciferol (VITAMIN D3) 10000 units capsule Take 10,000 Units by mouth daily.    Nichole Wilson Ethyl (VASCEPA) 1 g CAPS Take 2 capsules (2 g total) by mouth 2 (two) times daily. 360 capsule 1  . Multiple Vitamins-Minerals (MULTIVITAMIN ADULT PO) Take by mouth.    . pyridoxine (B-6) 250 MG tablet Take 250 mg by mouth daily.    . RESTASIS 0.05 % ophthalmic emulsion   2  . nebivolol (BYSTOLIC) 5 MG tablet Take 1 tablet (5 mg total) by mouth daily. 30 tablet 1  . anastrozole (ARIMIDEX) 1 MG tablet Take 1 tablet (1 mg total) by mouth daily. 90 tablet 3   No facility-administered medications prior to visit.     ROS Review of Systems  Constitutional: Negative for diaphoresis, fatigue and unexpected weight change.  HENT: Negative.   Eyes: Negative for visual disturbance.  Respiratory: Negative for cough, chest tightness, shortness of breath and wheezing.   Cardiovascular: Negative for chest pain, palpitations and leg swelling.  Gastrointestinal: Negative.  Negative for abdominal pain, constipation, diarrhea, nausea and vomiting.  Endocrine: Negative.  Negative for polydipsia, polyphagia and polyuria.  Genitourinary: Negative.  Negative for difficulty  urinating.  Musculoskeletal: Negative.  Negative for arthralgias and myalgias.  Skin: Negative for color change and pallor.  Neurological: Positive for dizziness and headaches. Negative for weakness and light-headedness.  Hematological: Negative for adenopathy. Does not bruise/bleed easily.  Psychiatric/Behavioral: Negative.     Objective:  BP 124/86 (BP Location: Left Arm, Patient Position: Sitting, Cuff Size: Normal)   Pulse 86   Temp 98 F (36.7 C) (Oral)   Resp 16   Ht 5\' 2"  (1.575 m)   Wt 154 lb 4 oz (70 kg)   LMP 11/29/2011   SpO2 96%   BMI 28.21 kg/m   BP Readings from Last 3 Encounters:  05/15/18 124/86  12/13/17 118/80  08/23/17 130/80    Wt Readings from Last 3 Encounters:  05/15/18 154 lb 4 oz (70 kg)  12/13/17 157 lb 12 oz (71.6 kg)  08/23/17 147 lb 12 oz (67 kg)    Physical Exam  Constitutional: She is oriented to person, place, and time. No distress.  HENT:  Mouth/Throat: Oropharynx is clear and moist. No oropharyngeal exudate.  Eyes: Conjunctivae are normal. No scleral icterus.  Neck: Normal range of motion. Neck supple. No JVD present. No thyromegaly present.  Cardiovascular: Normal rate, regular rhythm and normal heart sounds. Exam reveals no gallop.  No murmur heard. Pulmonary/Chest: Effort normal and breath sounds normal. No respiratory distress. She has no wheezes. She has no rales.  Abdominal: Soft. Bowel sounds are normal. She exhibits no mass. There is  no hepatosplenomegaly. There is no tenderness.  Musculoskeletal: Normal range of motion. She exhibits no edema, tenderness or deformity.  Lymphadenopathy:    She has no cervical adenopathy.  Neurological: She is alert and oriented to person, place, and time.  Skin: Skin is warm and dry. She is not diaphoretic. No pallor.  Vitals reviewed.   Lab Results  Component Value Date   WBC 5.4 08/21/2017   HGB 12.6 08/21/2017   HCT 36.3 08/21/2017   PLT 258 08/21/2017   GLUCOSE 156 (H) 05/15/2018    CHOL 252 (H) 05/15/2018   TRIG 356.0 (H) 05/15/2018   HDL 48.80 05/15/2018   LDLDIRECT 132.0 05/15/2018   LDLCALC Comment 03/27/2017   ALT 40 (H) 05/15/2018   AST 30 05/15/2018   NA 138 05/15/2018   K 3.7 05/15/2018   CL 103 05/15/2018   CREATININE 0.60 05/15/2018   BUN 13 05/15/2018   CO2 24 05/15/2018   TSH 0.342 08/08/2016   INR 0.90 (L) 05/07/2017   HGBA1C 6.5 05/15/2018   MICROALBUR <0.7 05/15/2018    US Breast Ltd Uni Left Inc Axilla  Result Date: 02/22/2018 CLINICAL DATA:  Short interval follow-up for probably benign 4 millimeter mass in the LEFT breast. History of RIGHT mastectomy in March of 2017. EXAM: DIGITAL DIAGNOSTIC LEFT MAMMOGRAM WITH CAD AND TOMO ULTRASOUND LEFT BREAST COMPARISON:  MRI 03/16/2017, ultrasound and mammogram on 03/02/2017, and earlier studies ACR Breast Density Category c: The breast tissue is heterogeneously dense, which may obscure small masses. FINDINGS: No suspicious mass, distortion, or microcalcifications are identified to suggest presence of malignancy. Mammographic images were processed with CAD. Targeted ultrasound is performed, showing a circumscribed oval hypoechoic mass in the 2 o'clock location of the LEFT breast 3 centimeters from the nipple which measures 0.4 x 0.2 centimeters. A similar hypoechoic oval mass is identified adjacent to this mass, measuring 0.3 x 0.3 centimeters. IMPRESSION: 1.  No mammographic or ultrasound evidence for malignancy. 2. Benign appearing masses in the 2 o'clock location of the LEFT breast. RECOMMENDATION: LEFT diagnostic mammogram and ultrasound recommended in 1 year. I have discussed the findings and recommendations with the patient. Results were also provided in writing at the conclusion of the visit. If applicable, a reminder letter will be sent to the patient regarding the next appointment. BI-RADS CATEGORY  3: Probably benign. Electronically Signed   By: Nolon Nations M.D.   On: 02/22/2018 14:38   Mm Diag Breast  Tomo Uni Left  Result Date: 02/22/2018 CLINICAL DATA:  Short interval follow-up for probably benign 4 millimeter mass in the LEFT breast. History of RIGHT mastectomy in March of 2017. EXAM: DIGITAL DIAGNOSTIC LEFT MAMMOGRAM WITH CAD AND TOMO ULTRASOUND LEFT BREAST COMPARISON:  MRI 03/16/2017, ultrasound and mammogram on 03/02/2017, and earlier studies ACR Breast Density Category c: The breast tissue is heterogeneously dense, which may obscure small masses. FINDINGS: No suspicious mass, distortion, or microcalcifications are identified to suggest presence of malignancy. Mammographic images were processed with CAD. Targeted ultrasound is performed, showing a circumscribed oval hypoechoic mass in the 2 o'clock location of the LEFT breast 3 centimeters from the nipple which measures 0.4 x 0.2 centimeters. A similar hypoechoic oval mass is identified adjacent to this mass, measuring 0.3 x 0.3 centimeters. IMPRESSION: 1.  No mammographic or ultrasound evidence for malignancy. 2. Benign appearing masses in the 2 o'clock location of the LEFT breast. RECOMMENDATION: LEFT diagnostic mammogram and ultrasound recommended in 1 year. I have discussed the findings and recommendations with the  patient. Results were also provided in writing at the conclusion of the visit. If applicable, a reminder letter will be sent to the patient regarding the next appointment. BI-RADS CATEGORY  3: Probably benign. Electronically Signed   By: Nolon Nations M.D.   On: 02/22/2018 14:38    Assessment & Plan:   Caitlen was seen today for hypertension, diabetes and hyperlipidemia.  Diagnoses and all orders for this visit:  Type 2 diabetes mellitus with complication, without long-term current use of insulin ([place])- Her A1c is at 6.5%.  Medical therapy is not indicated.  I have asked her to start taking an ARB for renal protection. -     Comprehensive metabolic panel; Future -     Hemoglobin A1c; Future -     Microalbumin / creatinine urine  ratio; Future -     Azilsartan-Chlorthalidone (EDARBYCLOR) 40-12.5 MG TABS; Take 1 tablet by mouth daily.  Essential hypertension- Her blood pressure is adequately well controlled.  Will continue nebivolol and a thiazide diuretic.  I have also asked her to add an ARB for renal protection. -     Comprehensive metabolic panel; Future -     Urinalysis, Routine w reflex microscopic; Future -     nebivolol (BYSTOLIC) 5 MG tablet; Take 1 tablet (5 mg total) by mouth daily. -     Azilsartan-Chlorthalidone (EDARBYCLOR) 40-12.5 MG TABS; Take 1 tablet by mouth daily.  Hypercholesterolemia with hypertriglyceridemia- I have asked her to start taking a statin for CV risk reduction. -     Lipid panel; Future -     atorvastatin (LIPITOR) 20 MG tablet; Take 1 tablet (20 mg total) by mouth daily.  Pure hyperglyceridemia- Her triglycerides are better but are still too high.  I have asked her to continue VASCEPA and to try to improve her lifestyle modifications as well. -     Lipid panel; Future   I am having [name]. Liebert [name] start on Azilsartan-Chlorthalidone and atorvastatin. I am also having her maintain her aspirin, Vitamin D3, RESTASIS, Icosapent Ethyl, anastrozole, pyridoxine, Multiple Vitamins-Minerals (MULTIVITAMIN ADULT PO), and nebivolol.  Meds ordered this encounter  Medications  . nebivolol (BYSTOLIC) 5 MG tablet    Sig: Take 1 tablet (5 mg total) by mouth daily.    Dispense:  90 tablet    Refill:  1  . Azilsartan-Chlorthalidone (EDARBYCLOR) 40-12.5 MG TABS    Sig: Take 1 tablet by mouth daily.    Dispense:  90 tablet    Refill:  1  . atorvastatin (LIPITOR) 20 MG tablet    Sig: Take 1 tablet (20 mg total) by mouth daily.    Dispense:  90 tablet    Refill:  1     Follow-up: Return in about 3 months (around 08/15/2018).  Scarlette Calico, MD

## 2018-05-15 NOTE — Patient Instructions (Signed)

## 2018-05-21 ENCOUNTER — Telehealth: Payer: Self-pay

## 2018-06-04 ENCOUNTER — Ambulatory Visit: Payer: No Typology Code available for payment source | Admitting: Internal Medicine

## 2018-06-10 ENCOUNTER — Ambulatory Visit (INDEPENDENT_AMBULATORY_CARE_PROVIDER_SITE_OTHER)
Admission: RE | Admit: 2018-06-10 | Discharge: 2018-06-10 | Disposition: A | Discharge status: Home / Self Care | Payer: No Typology Code available for payment source | Source: Ambulatory Visit | Attending: Family | Admitting: Family

## 2018-06-10 ENCOUNTER — Encounter: Payer: Self-pay | Admitting: Family

## 2018-06-10 ENCOUNTER — Ambulatory Visit (INDEPENDENT_AMBULATORY_CARE_PROVIDER_SITE_OTHER): Payer: No Typology Code available for payment source | Admitting: Family

## 2018-06-10 ENCOUNTER — Other Ambulatory Visit (INDEPENDENT_AMBULATORY_CARE_PROVIDER_SITE_OTHER): Payer: No Typology Code available for payment source

## 2018-06-10 VITALS — BP 118/82 | HR 87 | Temp 98.3°F | Ht 62.0 in | Wt 154.4 lb

## 2018-06-10 DIAGNOSIS — R1114 Bilious vomiting: Secondary | ICD-10-CM

## 2018-06-10 DIAGNOSIS — K920 Hematemesis: Secondary | ICD-10-CM

## 2018-06-10 LAB — CBC WITH DIFFERENTIAL/PLATELET
BASOS ABS: 0 10*3/uL (ref 0.0–0.1)
Basophils Relative: 0.6 % (ref 0.0–3.0)
EOS PCT: 1.1 % (ref 0.0–5.0)
Eosinophils Absolute: 0.1 10*3/uL (ref 0.0–0.7)
HCT: 38.7 % (ref 36.0–46.0)
HEMOGLOBIN: 13.2 g/dL (ref 12.0–15.0)
Lymphocytes Relative: 34.7 % (ref 12.0–46.0)
Lymphs Abs: 2.3 10*3/uL (ref 0.7–4.0)
MCHC: 34.2 g/dL (ref 30.0–36.0)
MCV: 93.3 fl (ref 78.0–100.0)
MONO ABS: 0.4 10*3/uL (ref 0.1–1.0)
MONOS PCT: 6.5 % (ref 3.0–12.0)
Neutro Abs: 3.7 10*3/uL (ref 1.4–7.7)
Neutrophils Relative %: 57.1 % (ref 43.0–77.0)
Platelets: 277 10*3/uL (ref 150.0–400.0)
RBC: 4.15 Mil/uL (ref 3.87–5.11)
RDW: 13.3 % (ref 11.5–15.5)
WBC: 6.5 10*3/uL (ref 4.0–10.5)

## 2018-06-10 LAB — COMPREHENSIVE METABOLIC PANEL
ALBUMIN: 4.2 g/dL (ref 3.5–5.2)
ALT: 33 U/L (ref 0–35)
AST: 22 U/L (ref 0–37)
Alkaline Phosphatase: 117 U/L (ref 39–117)
BILIRUBIN TOTAL: 0.6 mg/dL (ref 0.2–1.2)
BUN: 10 mg/dL (ref 6–23)
CO2: 27 mEq/L (ref 19–32)
Calcium: 9.7 mg/dL (ref 8.4–10.5)
Chloride: 102 mEq/L (ref 96–112)
Creatinine, Ser: 0.62 mg/dL (ref 0.40–1.20)
GFR: 106.04 mL/min (ref >60.00)
Glucose, Bld: 129 mg/dL — ABNORMAL HIGH (ref 70–99)
Potassium: 4.2 mEq/L (ref 3.5–5.1)
Sodium: 138 mEq/L (ref 135–145)
TOTAL PROTEIN: 7.6 g/dL (ref 6.0–8.3)

## 2018-06-10 LAB — LIPASE: Lipase: 13 U/L (ref 11.0–59.0)

## 2018-06-10 LAB — AMYLASE: Amylase: 46 U/L (ref 27–131)

## 2018-06-10 MED ORDER — PANTOPRAZOLE SODIUM 40 MG PO TBEC: 40.0000 mg | DELAYED_RELEASE_TABLET | Freq: Two times a day (BID) | ORAL | 1 refills | Status: DC

## 2018-06-10 MED FILL — RESTASIS 0.05% EYE EMULSION: 0.05 | 90 days supply | Qty: 180 | Fill #1

## 2018-06-10 MED FILL — PANTOPRAZOLE SOD DR 40 MG T: 40 | 30 days supply | Qty: 60 | Fill #0

## 2018-06-10 NOTE — Telephone Encounter (Addendum)
Key: [id number]

## 2018-06-10 NOTE — Progress Notes (Signed)
Nichole Wilson is a 57 y.o. female with the following history as recorded in Epic[employer]:  Patient Active Problem List   Diagnosis Date Noted  . Pure hyperglyceridemia 08/23/2017  . ANA positive 08/06/2017  . Essential hypertension 01/04/2016  . Gastric mass GIST vs leiomyoma 12/01/2015  . Anal fissure - posterior 11/26/2015  . Breast cancer of upper-outer quadrant of right female breast ([place]) 08/26/2015  . Anterior scleritis of both eyes 05/12/2015  . Overweight (BMI 25.0-29.9) 05/12/2015  . Type 2 diabetes mellitus with complication, without long-term current use of insulin ([place]) 05/12/2015  . Plantar fasciitis 09/05/2013  . Health care maintenance 03/24/2013  . Sciatica 05/17/2012  . Hepatic steatosis 01/02/2012  . Hypercholesterolemia with hypertriglyceridemia 02/24/2009  . COMMON MIGRAINE 01/09/2007    Current Outpatient Medications  Medication Sig Dispense Refill  . anastrozole (ARIMIDEX) 1 MG tablet TAKE 1 TABLET BY MOUTH DAILY. 90 tablet 0  . aspirin 81 MG tablet Take 81 mg by mouth daily.    [name] atorvastatin (LIPITOR) 20 MG tablet Take 1 tablet (20 mg total) by mouth daily. 90 tablet 1  . Azilsartan-Chlorthalidone (EDARBYCLOR) 40-12.5 MG TABS Take 1 tablet by mouth daily. 90 tablet 1  . Cholecalciferol (VITAMIN D3) 10000 units capsule Take 10,000 Units by mouth daily.    [name] Ethyl (VASCEPA) 1 g CAPS Take 2 capsules (2 g total) by mouth 2 (two) times daily. 360 capsule 1  . Multiple Vitamins-Minerals (MULTIVITAMIN ADULT PO) Take by mouth.    . nebivolol (BYSTOLIC) 5 MG tablet Take 1 tablet (5 mg total) by mouth daily. 90 tablet 1  . pantoprazole (PROTONIX) 40 MG tablet Take 1 tablet (40 mg total) by mouth 2 (two) times daily before a meal. 60 tablet 1  . pyridoxine (B-6) 250 MG tablet Take 250 mg by mouth daily.    . RESTASIS 0.05 % ophthalmic emulsion   2   No current facility-administered medications for this visit.     Allergies: Lisinopril and Other  Past Medical  History:  Diagnosis Date  . Breast cyst, left 01-04-12   cyst no longer apparent  . Cancer [facility])    right breast cancer  . Cholelithiasis with choledocholithiasis   . Chronic calculus cholecystitis 12/25/2011   Lap chole on 03/25/12   . Complication of anesthesia 2002   PT STATES DURING HER C-SECTION SHE WAS AWARE OF INCISION AND PAIN -THEN WAS QUICKLY "PUT OUT"  . Diabetes mellitus without complication ([place])   . Family history of adverse reaction to anesthesia    father became aggressive  . Gallstone pancreatitis   . Glucose intolerance (impaired glucose tolerance)    since at least 2011.   [name] Hepatic steatosis   . Hypercholesterolemia with hypertriglyceridemia   . Hypertension 01-04-12   med d/c-last taken 12-07-11  -PT Murphy STOPPED HER B/P MEDICATION BECAUSE HER B/P TOO LOW AND PT STATES HER B/P'S HAVE BEEN OK SINCE.  [name] Hypertriglyceridemia   . Migraine without aura, without mention of intractable migraine without mention of status migrainosus   . PONV (postoperative nausea and vomiting)    patient states that she did not get sick with mastectomy  . Pre-diabetes     Past Surgical History:  Procedure Laterality Date  . CESAREAN SECTION     2002  . CHOLECYSTECTOMY  03/25/2012   Procedure: LAPAROSCOPIC CHOLECYSTECTOMY WITH INTRAOPERATIVE CHOLANGIOGRAM;  Surgeon: Haywood Lasso, MD;  Location: [facility];  Service: General;  Laterality: N/A;  Laparoscopic Cholecystectomy  with Intraoperative Cholangiogram  . COLONOSCOPY    . ERCP  12/05/2011   Procedure: ENDOSCOPIC RETROGRADE CHOLANGIOPANCREATOGRAPHY (ERCP);  Surgeon: Inda Castle, MD;  Location: [facility];  Service: Endoscopy;  Laterality: N/A;  . ERCP  12/08/2011   Procedure: ENDOSCOPIC RETROGRADE CHOLANGIOPANCREATOGRAPHY (ERCP);  Surgeon: Inda Castle, MD;  Location: [place];  Service: Gastroenterology;  Laterality: N/A;  . ESOPHAGOGASTRODUODENOSCOPY (EGD) WITH PROPOFOL N/A 09/28/2015   Procedure:  ESOPHAGOGASTRODUODENOSCOPY (EGD) WITH PROPOFOL;  Surgeon: Clarene Essex, MD;  Location: [facility];  Service: Endoscopy;  Laterality: N/A;  H AND P IN DRAWER  . EUS N/A 10/13/2015   Procedure: UPPER ENDOSCOPIC ULTRASOUND (EUS) RADIAL;  Surgeon: Arta Silence, MD;  Location: [facility];  Service: Endoscopy;  Laterality: N/A;  . LAPAROSCOPIC PARTIAL GASTRECTOMY N/A 01/11/2016   Procedure: LAPAROSCOPIC PARTIAL GASTRECTOMY;  Surgeon: [name], MD;  Location: [place];  Service: General;  Laterality: N/A;  . MASTECTOMY W/ SENTINEL NODE BIOPSY Right 09/15/2015   Procedure: RIGHT MASTECTOMY WITH SENTINEL LYMPH NODE BIOPSY;  Surgeon: Rolm Bookbinder, MD;  Location: [place];  Service: General;  Laterality: Right;  . UPPER GI ENDOSCOPY N/A 01/11/2016   Procedure: UPPER GI ENDOSCOPY;  Surgeon: [name], MD;  Location: [facility];  Service: General;  Laterality: N/A;    Family History  Problem Relation Age of Onset  . Diabetes Mother   . Diabetes Father   . Diabetes Brother   . Heart disease Paternal Uncle   . Liver disease Brother   . Colon cancer Neg Hx     Social History   Tobacco Use  . Smoking status: Never Smoker  . Smokeless tobacco: Never Used  Substance Use Topics  . Alcohol use: No    Subjective:  Patient had episode of vomiting on Saturday morning- had blood tinged mucus/ bile; ate at home on Friday night- no other family members were sick; notes in past 3 months, she had 2 othersimilar episode of vomiting but no blood was seen; does not have her gallbladder; no changes in bowel movements- no acid reflux; no dark stools, no coffee grounds emesis; no fever; history of gastroparesis; complaining of pain localized in center of abdomen/ LLQ;     Objective:  Vitals:   06/10/18 1133  BP: 118/82  Pulse: 87  Temp: 98.3 F (36.8 C)  TempSrc: Oral  SpO2: 96%  Weight: 154 lb 6.4 oz (70 kg)  Height: 5' 2"  (1.575 m)    General: Well developed, well nourished, in no acute distress  Skin :  Warm and dry.  Head: Normocephalic and atraumatic  Lungs: Respirations unlabored; clear to auscultation bilaterally without wheeze, rales, rhonchi  CVS exam: normal rate and regular rhythm.  Abdomen: Soft; nontender; nondistended; normoactive bowel sounds; no masses or hepatosplenomegaly  Neurologic: Alert and oriented; speech intact; face symmetrical; moves all extremities well; CNII-XII intact without focal deficit   Assessment:  1. Bilious vomiting, presence of nausea not specified   2. Hematemesis, presence of nausea not specified     Plan:  Update CXR today; will also update labs; start Protonix 40 mg bid; refer back to her GI- Dr. [name]- will let him decide what imaging may need to be done/ further testing; follow-up as needed otherwise.    No follow-ups on file.  Orders Placed This Encounter  Procedures  . DG Chest 2 View    Standing Status:   Future    Number of Occurrences:   1    Standing Expiration Date:  08/11/2019    Order Specific Question:   Reason for Exam (SYMPTOM  OR DIAGNOSIS REQUIRED)    Answer:   hematemesis    Order Specific Question:   Is patient pregnant?    Answer:   No    Order Specific Question:   Preferred imaging location?    Answer:   [facility]    Order Specific Question:   Radiology Contrast Protocol - do NOT remove file path    Answer:   [email or web address]  . CBC w/Diff    Standing Status:   Future    Number of Occurrences:   1    Standing Expiration Date:   06/10/2019  . Comp Met (CMET)    Standing Status:   Future    Number of Occurrences:   1    Standing Expiration Date:   06/10/2019  . Amylase    Standing Status:   Future    Number of Occurrences:   1    Standing Expiration Date:   06/10/2019  . Lipase    Standing Status:   Future    Number of Occurrences:   1    Standing Expiration Date:   06/10/2019  . Ambulatory referral to Gastroenterology    Referral Priority:   Routine    Referral  Type:   Consultation    Referral Reason:   Specialty Services Required    Number of Visits Requested:   1    Requested Prescriptions   Signed Prescriptions Disp Refills  . pantoprazole (PROTONIX) 40 MG tablet 60 tablet 1    Sig: Take 1 tablet (40 mg total) by mouth 2 (two) times daily before a meal.

## 2018-06-19 NOTE — Telephone Encounter (Signed)
PA for Nichole Wilson has been approved.   Mychart message sent to patient informing of same.

## 2018-07-16 ENCOUNTER — Encounter: Payer: Self-pay | Admitting: Physician Assistant

## 2018-07-16 ENCOUNTER — Ambulatory Visit: Payer: No Typology Code available for payment source | Admitting: Physician Assistant

## 2018-07-16 VITALS — BP 122/88 | HR 78 | Ht 62.0 in | Wt 153.1 lb

## 2018-07-16 DIAGNOSIS — R112 Nausea with vomiting, unspecified: Secondary | ICD-10-CM | POA: Diagnosis not present

## 2018-07-16 DIAGNOSIS — K3184 Gastroparesis: Secondary | ICD-10-CM | POA: Diagnosis not present

## 2018-07-16 DIAGNOSIS — Z903 Acquired absence of stomach [part of]: Secondary | ICD-10-CM | POA: Diagnosis not present

## 2018-07-16 NOTE — Progress Notes (Signed)
Subjective:    Patient ID: Nichole Wilson, female    DOB: 06-16-1961, 57 y.o.   MRN: [id number]  HPI Nichole Wilson is a pleasant 57 year old Hispanic female known to Dr. [name] who is referred today by Dr. Christianne Borrow care for evaluation of nausea and vomiting. Patient has history of diabetic gastroparesis, which she has managed well with diet. She underwent upper endoscopy in October 2017 with finding of a large amount of residual food in the stomach and otherwise negative exam.  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  She says that her symptoms started postoperatively. She is also had colonoscopy in 2015 with finding of mild pandiverticulosis no polyps. Patient says she does best if she eats small portions, generally has to avoid salads other than in very small portions and says that she finds eating papaya and coconut water for meals when she is having any gastric symptoms is quite helpful. She had an episode on November 23 with nausea and vomiting with 3 separate episodes.  She says she woke up that day nauseated and the first time she vomited she brought up a small amount of pinkish and bright red blood with the emesis.  Capture she showed me a picture on her phone which did show a small amount of pinkish blood mixed in with emesis.  The other episodes did not contain any blood.  She did not notice any melena after that episode.  She put herself on a liquid diet for a few days thereafter and has done okay since.  Denies any heartburn or indigestion Is not been using any regular NSAIDs but does take a baby aspirin daily.  She has had other episodes of emesis earlier in the fall but none since late November. She also relates that she generally fasts for 2 or 3 days/week and takes in only liquids.  She feels that this helps her gastroparesis symptoms a lot and she also does this for spiritual reasons. She has not had any recent  imaging other than chest x-ray which was unremarkable. Labs were done on 06/10/2018 with normal CBC, normal lipase and amylase and c-Met pertinent for glucose of 129.  Review of Systems Pertinent positive and negative review of systems were noted in the above HPI section.  All other review of systems was otherwise negative.  Outpatient Encounter Medications as of 07/16/2018  Medication Sig  . anastrozole (ARIMIDEX) 1 MG tablet TAKE 1 TABLET BY MOUTH DAILY.  [name] aspirin 81 MG tablet Take 81 mg by mouth daily.  . Cholecalciferol (VITAMIN D3) 10000 units capsule Take 10,000 Units by mouth daily.  Nichole Wilson Ethyl (VASCEPA) 1 g CAPS Take 2 capsules (2 g total) by mouth 2 (two) times daily.  . Multiple Vitamins-Minerals (MULTIVITAMIN ADULT PO) Take by mouth.  . nebivolol (BYSTOLIC) 5 MG tablet Take 1 tablet (5 mg total) by mouth daily.  [name] pyridoxine (B-6) 250 MG tablet Take 250 mg by mouth daily.  . RESTASIS 0.05 % ophthalmic emulsion   . [DISCONTINUED] atorvastatin (LIPITOR) 20 MG tablet Take 1 tablet (20 mg total) by mouth daily.  . [DISCONTINUED] Azilsartan-Chlorthalidone (EDARBYCLOR) 40-12.5 MG TABS Take 1 tablet by mouth daily.  . [DISCONTINUED] pantoprazole (PROTONIX) 40 MG tablet Take 1 tablet (40 mg total) by mouth 2 (two) times daily before a meal.   No facility-administered encounter medications on file as of 07/16/2018.    Allergies  Allergen Reactions  . Lisinopril  Swelling  . Other     General anesthesia-severe vomiting   Patient Active Problem List   Diagnosis Date Noted  . Pure hyperglyceridemia 08/23/2017  . ANA positive 08/06/2017  . Essential hypertension 01/04/2016  . Gastric mass GIST vs leiomyoma 12/01/2015  . Anal fissure - posterior 11/26/2015  . Breast cancer of upper-outer quadrant of right female breast ([place]) 08/26/2015  . Anterior scleritis of both eyes 05/12/2015  . Overweight (BMI 25.0-29.9) 05/12/2015  . Type 2 diabetes mellitus with complication, without  long-term current use of insulin ([place]) 05/12/2015  . Plantar fasciitis 09/05/2013  . Health care maintenance 03/24/2013  . Sciatica 05/17/2012  . Hepatic steatosis 01/02/2012  . Hypercholesterolemia with hypertriglyceridemia 02/24/2009  . COMMON MIGRAINE 01/09/2007   Social History   Socioeconomic History  . Marital status: Married    Spouse name: Not on file  . Number of children: 2  . Years of education: Not on file  . Highest education level: Not on file  Occupational History  . 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   Social Needs  . Financial resource strain: Not on file  . Food insecurity:    Worry: Not on file    Inability: Not on file  . Transportation needs:    Medical: Not on file    Non-medical: Not on file  Tobacco Use  . Smoking status: Never Smoker  . Smokeless tobacco: Never Used  Substance and Sexual Activity  . Alcohol use: No  . Drug use: No  . Sexual activity: Yes  Lifestyle  . Physical activity:    Days per week: Not on file    Minutes per session: Not on file  . Stress: Not on file  Relationships  . Social connections:    Talks on phone: Not on file    Gets together: Not on file    Attends religious service: Not on file    Active member of club or organization: Not on file    Attends meetings of clubs or organizations: Not on file    Relationship status: Not on file  . Intimate partner violence:    Fear of current or ex partner: Not on file    Emotionally abused: Not on file    Physically abused: Not on file    Forced sexual activity: Not on file  Other Topics Concern  . Not on file  Social History Narrative   Married    - spanish interpreter   + children   No caffeine    Ms. Nichole Wilson's family history includes Diabetes in her brother, father, and mother; Heart disease in her paternal uncle; Liver disease in her brother.      Objective:    Vitals:    07/16/18 1052  BP: 122/88  Pulse: 78    Physical Exam; well-developed Hispanic female in no acute distress, very pleasant, BMI 28.  HEENT nontraumatic normocephalic EOMI PERRLA sclera anicteric oral mucosa moist, Cardiovascular regular rate and rhythm with S1-S2 no murmur rub or gallop, Pulmonary clear bilaterally, Abdomen soft, sounds active no definite succussion splash protuberant in the upper abdomen palpable mass or hepatosplenomegaly basically nontender.  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 Patient has thus far managed symptoms with diet. She comes in now after an episode of nausea and vomiting associated with a small amount of hematochezia, and has had other sporadic episodes of nausea and vomiting over the past several months. Recent labs reassuring Certainly nausea and vomiting may have been secondary to exacerbation of gastroparesis, viral syndrome. With self-limited small-volume hematemesis will rule out ulcer, gastropathy and recurrent neoplasm/leiomyoma in patient status post partial gastrectomy  #2 adult onset diabetes mellitus 3.  Hepatic steatosis 4.  History of breast cancer 5.  Diverticulosis-up-to-date with colon cancer surveillance last done 2015  Plan; Patient will continue to follow gastroparesis diet, and plans to continuing fasting for 1 or 2 days/week. Will schedule for upper endoscopy with Dr. [name].  Procedure was discussed in detail with patient including indications risks and benefits and she is agreeable to proceed.  She is asked to call in the interim if she has any further episodes of hematemesis. I have advised her to fast for 1 to 2 days prior to endoscopy to  aid with good visualization of the stomach as her last endoscopy is limited by bezoar.  Nichole Wilson Genia Harold PA-C 07/16/2018   Cc: Janith Lima, MD

## 2018-07-16 NOTE — Patient Instructions (Signed)
Continue Gastroparesis diet.   You have been scheduled for an endoscopy. Please follow written instructions given to you at your visit today. If you use inhalers (even only as needed), please bring them with you on the day of your procedure. Your physician has requested that you go to www.startemmi.com and enter the access code given to you at your visit today. This web site gives a general overview about your procedure. However, you should still follow specific instructions given to you by our office regarding your preparation for the procedure.  Normal BMI (Body Mass Index- based on height and weight) is between 19 and 25. Your BMI today is Body mass index is 28.01 kg/m. [name] Please consider follow up  regarding your BMI with your Primary Care Provider.

## 2018-07-19 ENCOUNTER — Ambulatory Visit: Payer: No Typology Code available for payment source | Admitting: Nurse Practitioner

## 2018-07-29 MED FILL — EDARBYCLOR 40-12.5 MG TAB: 40-12.5 | 30 days supply | Qty: 30 | Fill #0

## 2018-08-07 ENCOUNTER — Ambulatory Visit (INDEPENDENT_AMBULATORY_CARE_PROVIDER_SITE_OTHER): Payer: No Typology Code available for payment source | Admitting: Internal Medicine

## 2018-08-07 ENCOUNTER — Encounter: Payer: Self-pay | Admitting: Internal Medicine

## 2018-08-07 ENCOUNTER — Other Ambulatory Visit: Payer: Self-pay

## 2018-08-07 VITALS — BP 102/74 | HR 102 | Temp 97.7°F | Resp 18 | Ht 62.0 in | Wt 150.0 lb

## 2018-08-07 DIAGNOSIS — I10 Essential (primary) hypertension: Secondary | ICD-10-CM | POA: Diagnosis not present

## 2018-08-07 DIAGNOSIS — R768 Other specified abnormal immunological findings in serum: Secondary | ICD-10-CM

## 2018-08-07 DIAGNOSIS — R748 Abnormal levels of other serum enzymes: Secondary | ICD-10-CM

## 2018-08-07 DIAGNOSIS — M79642 Pain in left hand: Secondary | ICD-10-CM | POA: Diagnosis not present

## 2018-08-07 DIAGNOSIS — R7689 Other specified abnormal immunological findings in serum: Secondary | ICD-10-CM

## 2018-08-07 DIAGNOSIS — E1121 Type 2 diabetes mellitus with diabetic nephropathy: Secondary | ICD-10-CM | POA: Diagnosis not present

## 2018-08-07 DIAGNOSIS — K3184 Gastroparesis: Secondary | ICD-10-CM | POA: Diagnosis not present

## 2018-08-07 DIAGNOSIS — M89319 Hypertrophy of bone, unspecified shoulder: Secondary | ICD-10-CM

## 2018-08-07 DIAGNOSIS — Z0100 Encounter for examination of eyes and vision without abnormal findings: Secondary | ICD-10-CM

## 2018-08-07 DIAGNOSIS — E119 Type 2 diabetes mellitus without complications: Secondary | ICD-10-CM

## 2018-08-07 HISTORY — DX: Abnormal levels of other serum enzymes: R74.8

## 2018-08-07 NOTE — Progress Notes (Signed)
Subjective:     Patient ID: Nichole Wilson , female    DOB: 06-Jul-1961 , 58 y.o.   MRN: [id number]   Chief Complaint  Patient presents with  . SWELLING IN PALM RIGHT HAND    X 80YR, RIGHT AND LEFT FOOT SWELLING AND FACIAL SWELLING    HPI She is here to establish care and her HGBA1A around 6. Next  Eye xm is 11/2018, has dry eyes.  1-Since 2008 her L mid palm has been swelling off and and uses natural pills from [place] remedy and helps the pain to the point of resolving it. This comes and goes off and on. Her L hand flaired up again. Has had + ANA but never saw a rheumatologist.  2-Her gastroparesis secondary to stomach fibroid removal  is stable as long as she has smoothies with papaya and pineapple. She had her last flair Mid November where she had blood in mucous she gagged. Will be having an endoscopy in a few weeks.  3- R  Proximal clavicle prominence since 2016, has mild pain, is getting larger. Did not have a work up though she did mention to her prior PCP.  Past Medical History:  Diagnosis Date  . Breast cyst, left 01-04-12   cyst no longer apparent  . Cancer [facility])    right breast cancer  . Cholelithiasis with choledocholithiasis   . Chronic calculus cholecystitis 12/25/2011   Lap chole on 03/25/12   . Complication of anesthesia 2002   PT STATES DURING HER C-SECTION SHE WAS AWARE OF INCISION AND PAIN -THEN WAS QUICKLY "PUT OUT"  . Diabetes mellitus without complication ([place])   . Family history of adverse reaction to anesthesia    father became aggressive  . Gallstone pancreatitis   . Glucose intolerance (impaired glucose tolerance)    since at least 2011.   [name] Hepatic steatosis   . Hypercholesterolemia with hypertriglyceridemia   . Hypertension 01-04-12   med d/c-last taken 12-07-11  -PT [place] STOPPED HER B/P MEDICATION BECAUSE HER B/P TOO LOW AND PT STATES HER B/P'S HAVE BEEN OK SINCE.  [name] Hypertriglyceridemia   . Migraine without aura, without mention of  intractable migraine without mention of status migrainosus   . PONV (postoperative nausea and vomiting)    patient states that she did not get sick with mastectomy  . Pre-diabetes      Family History  Problem Relation Age of Onset  . Diabetes Mother   . Diabetes Father   . Diabetes Brother   . Heart disease Paternal Uncle   . Liver disease Brother   . Colon cancer Neg Hx      Current Outpatient Medications:  .  anastrozole (ARIMIDEX) 1 MG tablet, TAKE 1 TABLET BY MOUTH DAILY., Disp: 90 tablet, Rfl: 0 .  aspirin 81 MG tablet, Take 325 mg by mouth daily. , Disp: , Rfl:  .  Cholecalciferol (VITAMIN D3) 10000 units capsule, Take 10,000 Units by mouth daily., Disp: , Rfl:  .  Icosapent Ethyl (VASCEPA) 1 g CAPS, Take 2 capsules (2 g total) by mouth 2 (two) times daily., Disp: 360 capsule, Rfl: 1 .  Multiple Vitamins-Minerals (MULTIVITAMIN ADULT PO), Take by mouth., Disp: , Rfl:  .  nebivolol (BYSTOLIC) 5 MG tablet, Take 1 tablet (5 mg total) by mouth daily., Disp: 90 tablet, Rfl: 1 .  pyridoxine (B-6) 250 MG tablet, Take 250 mg by mouth daily., Disp: , Rfl:  .  RESTASIS 0.05 % ophthalmic emulsion, ,  Disp: , Rfl: 2 .  vitamin B-12 (CYANOCOBALAMIN) 1000 MCG tablet, Take 1,000 mcg by mouth daily., Disp: , Rfl:    Allergies  Allergen Reactions  . Lisinopril Swelling  . Other     General anesthesia-severe vomiting     Review of Systems  Constitutional: Negative for diaphoresis.  HENT: Positive for facial swelling. Negative for congestion, dental problem, rhinorrhea, sinus pressure and sinus pain.        Gets face swelling off and on  Respiratory: Negative for cough and shortness of breath.   Cardiovascular: Negative for chest pain, palpitations and leg swelling.  Gastrointestinal: Negative for constipation and diarrhea.       On occasion gets bloated.   Endocrine: Negative for polydipsia and polyphagia.  Genitourinary: Negative for difficulty urinating and dysuria.  Musculoskeletal:  Positive for arthralgias and joint swelling.       R sole of Lateral foot off and on x 3 days. No pain today. Seems to get worse when she first gets up in the am, she does some stretches and gets better.   Skin: Negative for rash and wound.  Neurological: Negative for facial asymmetry.     Today's Vitals   08/07/18 0950  BP: 102/74  Pulse: (!) 102  Resp: 18  Temp: 97.7 F (36.5 C)  TempSrc: Oral  SpO2: 97%  Weight: 150 lb (68 kg)  Height: 5\' 2"  (1.575 m)   Body mass index is 27.44 kg/m.   Objective:  Physical Exam  Constitutional: She is oriented to person, place, and time. She appears well-developed and well-nourished. No distress.  HENT: no face swelling noted today Head: Normocephalic and atraumatic.  Right Ear: External ear normal.  Left Ear: External ear normal.  Nose: Nose normal.  Eyes: Conjunctivae are normal. Right eye exhibits no discharge. Left eye exhibits no discharge. No scleral icterus.  Neck: Neck supple. No thyromegaly present.  No carotid bruits bilaterally  Cardiovascular: Normal rate and regular rhythm.  No murmur heard. Pulmonary/Chest: Effort normal and breath sounds normal. No respiratory distress.  Musculoskeletal: Normal range of motion. She exhibits no edema. L palm with swelling and erythematous of ball of hand. No tenderness provoked of sole of R foot today.  Lymphadenopathy: She has no cervical adenopathy.  Neurological: She is alert and oriented to person, place, and time.  Skin: Skin is warm and dry. Capillary refill takes less than 2 seconds. No rash noted. She is not diaphoretic.  Psychiatric: She has a normal mood and affect. Her behavior is normal. Judgment and thought content normal.  Nursing note reviewed.  Assessment And Plan:     1. Diabetic eye exam (HCC)-chronic. May continue same meds, FU in 3 months  - Ambulatory referral to Ophthalmology  2. Gastroparesis- Chronic, unrelated to DM. having endoscooy 08/22/18.    3. Essential  hypertension - stable  4- L hand pain- chronic. ANA ordered.   5- Positive ANA- ANA ordered.   6- R clavicle lump/ enlargement- chronic. Sent to get xray of this region.  We will inform her of her results when they are back.      Malavika Lira [name], PA-C

## 2018-08-07 NOTE — Patient Instructions (Signed)
° ° ° °  If you have lab work done today you will be contacted with your lab results within the next 2 weeks.  If you have not heard from us then please contact us. The fastest way to get your results is to register for My Chart. ° ° °IF you received an x-ray today, you will receive an invoice from [facility]. Please contact [facility] at [phone] with questions or concerns regarding your invoice.  ° °IF you received labwork today, you will receive an invoice from [facility]. Please contact [facility] at [phone] with questions or concerns regarding your invoice.  ° °Our billing staff will not be able to assist you with questions regarding bills from these companies. ° °You will be contacted with the lab results as soon as they are available. The fastest way to get your results is to activate your My Chart account. Instructions are located on the last page of this paperwork. If you have not heard from us regarding the results in 2 weeks, please contact this office. °  ° ° ° °

## 2018-08-08 ENCOUNTER — Other Ambulatory Visit: Payer: Self-pay | Admitting: Internal Medicine

## 2018-08-08 DIAGNOSIS — R748 Abnormal levels of other serum enzymes: Secondary | ICD-10-CM

## 2018-08-08 LAB — CMP14 + ANION GAP
ALT: 54 IU/L — ABNORMAL HIGH (ref 0–32)
ANION GAP: 17 mmol/L (ref 10.0–18.0)
AST: 31 IU/L (ref 0–40)
Albumin/Globulin Ratio: 1.3 (ref 1.2–2.2)
Albumin: 4.5 g/dL (ref 3.8–4.9)
Alkaline Phosphatase: 165 IU/L — ABNORMAL HIGH (ref 39–117)
BUN/Creatinine Ratio: 20 (ref 9–23)
BUN: 17 mg/dL (ref 6–24)
Bilirubin Total: 0.7 mg/dL (ref 0.0–1.2)
CO2: 25 mmol/L (ref 20–29)
CREATININE: 0.83 mg/dL (ref 0.57–1.00)
Calcium: 10.1 mg/dL (ref 8.7–10.2)
Chloride: 99 mmol/L (ref 96–106)
GFR calc Af Amer: 92 mL/min/{1.73_m2} (ref >59)
GFR calc non Af Amer: 80 mL/min/{1.73_m2} (ref >59)
GLOBULIN, TOTAL: 3.4 g/dL (ref 1.5–4.5)
Glucose: 126 mg/dL — ABNORMAL HIGH (ref 65–99)
Potassium: 4.8 mmol/L (ref 3.5–5.2)
Sodium: 141 mmol/L (ref 134–144)
Total Protein: 7.9 g/dL (ref 6.0–8.5)

## 2018-08-08 LAB — CBC
Hematocrit: 39.8 % (ref 34.0–46.6)
Hemoglobin: 14 g/dL (ref 11.1–15.9)
MCH: 31.9 pg (ref 26.6–33.0)
MCHC: 35.2 g/dL (ref 31.5–35.7)
MCV: 91 fL (ref 79–97)
Platelets: 328 10*3/uL (ref 150–450)
RBC: 4.39 x10E6/uL (ref 3.77–5.28)
RDW: 12.6 % (ref 11.7–15.4)
WBC: 6.6 10*3/uL (ref 3.4–10.8)

## 2018-08-08 LAB — LIPID PANEL
Chol/HDL Ratio: 5.7 ratio — ABNORMAL HIGH (ref 0.0–4.4)
Cholesterol, Total: 291 mg/dL — ABNORMAL HIGH (ref 100–199)
HDL: 51 mg/dL (ref >39)
TRIGLYCERIDES: 581 mg/dL — AB (ref 0–149)

## 2018-08-08 LAB — ANTINUCLEAR ANTIBODIES, IFA: ANA Titer 1: NEGATIVE

## 2018-08-08 LAB — HEMOGLOBIN A1C
Est. average glucose Bld gHb Est-mCnc: 146 mg/dL
Hgb A1c MFr Bld: 6.7 % — ABNORMAL HIGH (ref 4.8–5.6)

## 2018-08-08 NOTE — Progress Notes (Unsigned)
Please inform her that I received her labs via print out today, it has not made it to epic yet. It is showing very high triglycerides which the Vascepa is not taking care of it. This gets high with sweets, orange juice and high fat foods.  What is she eating?  Is she willing to try medication?  Also a couple of her liver enzymes are elevated and I only see she was tested for Hepatitis C in 2016, but not the others, so I will see if they can add hepatitis panel to the blood we have. Her HGBA1C is 6.7 and the ANA is not back yet.

## 2018-08-09 NOTE — Progress Notes (Signed)
Tried to call pt with no answer and VM was full, please try to call pt at another time.

## 2018-08-12 ENCOUNTER — Other Ambulatory Visit: Payer: Self-pay | Admitting: Hematology and Oncology

## 2018-08-14 LAB — HEPATITIS PANEL, ACUTE
Hep A IgM: NEGATIVE
Hep B C IgM: NEGATIVE
Hepatitis B Surface Ag: NEGATIVE

## 2018-08-14 LAB — SPECIMEN STATUS REPORT

## 2018-08-14 LAB — ALKALINE PHOSPHATASE, ISOENZYMES
Alkaline Phosphatase: 172 IU/L — ABNORMAL HIGH (ref 39–117)
BONE FRACTION: 55 % (ref 14–68)
INTESTINAL FRAC.: 0 % (ref 0–18)
LIVER FRACTION: 45 % (ref 18–85)

## 2018-08-22 ENCOUNTER — Ambulatory Visit (AMBULATORY_SURGERY_CENTER): Payer: No Typology Code available for payment source | Admitting: Internal Medicine

## 2018-08-22 ENCOUNTER — Encounter: Payer: Self-pay | Admitting: Internal Medicine

## 2018-08-22 VITALS — BP 124/56 | HR 84 | Temp 98.0°F | Resp 18 | Ht 62.0 in | Wt 150.0 lb

## 2018-08-22 DIAGNOSIS — K449 Diaphragmatic hernia without obstruction or gangrene: Secondary | ICD-10-CM

## 2018-08-22 DIAGNOSIS — R112 Nausea with vomiting, unspecified: Secondary | ICD-10-CM

## 2018-08-22 DIAGNOSIS — K3184 Gastroparesis: Secondary | ICD-10-CM

## 2018-08-22 DIAGNOSIS — K2970 Gastritis, unspecified, without bleeding: Secondary | ICD-10-CM

## 2018-08-22 DIAGNOSIS — K2210 Ulcer of esophagus without bleeding: Secondary | ICD-10-CM | POA: Diagnosis present

## 2018-08-22 MED ORDER — SODIUM CHLORIDE 0.9 % IV SOLN: 500.0000 mL | INTRAVENOUS | Status: DC

## 2018-08-22 MED ORDER — PANTOPRAZOLE SODIUM 40 MG PO TBEC: 40.0000 mg | DELAYED_RELEASE_TABLET | Freq: Every day | ORAL | 3 refills | Status: DC

## 2018-08-22 MED FILL — PANTOPRAZOLE SOD DR 40 MG T: 40 | 90 days supply | Qty: 90 | Fill #0

## 2018-08-22 NOTE — Progress Notes (Signed)
Called to room to assist during endoscopic procedure.  Patient ID and intended procedure confirmed with present staff. Received instructions for my participation in the procedure from the performing physician.  

## 2018-08-22 NOTE — Progress Notes (Signed)
Report given to PACU, vss 

## 2018-08-22 NOTE — Op Note (Signed)
[place] Patient Name: Nichole Wilson Procedure Date: 08/22/2018 9:40 AM MRN: [id number] Endoscopist: Gatha Mayer , MD Age: 58 Referring MD:  Date of Birth: 1960-08-14 Gender: Female Account #: [id number] Procedure:                Upper GI endoscopy Indications:              Hematemesis Medicines:                Propofol per Anesthesia, Monitored Anesthesia Care Procedure:                Pre-Anesthesia Assessment:                           - Prior to the procedure, a History and Physical                            was performed, and patient medications and                            allergies were reviewed. The patient's tolerance of                            previous anesthesia was also reviewed. The risks                            and benefits of the procedure and the sedation                            options and risks were discussed with the patient.                            All questions were answered, and informed consent                            was obtained. Prior Anticoagulants: The patient has                            taken no previous anticoagulant or antiplatelet                            agents. ASA Grade Assessment: II - A patient with                            mild systemic disease. After reviewing the risks                            and benefits, the patient was deemed in                            satisfactory condition to undergo the procedure.                           After obtaining informed consent, the endoscope was  passed under direct vision. Throughout the                            procedure, the patient's blood pressure, pulse, and                            oxygen saturations were monitored continuously. The                            Endoscope was introduced through the mouth, and                            advanced to the second part of duodenum. The upper                            GI endoscopy was  accomplished without difficulty.                            The patient tolerated the procedure well. Scope In: Scope Out: Findings:                 One esophageal ulcer and no stigmata of recent                            bleeding was found at the gastroesophageal                            junction. The lesion was 3 mm in largest dimension.                            Biopsies were taken with a cold forceps for                            histology. Verification of patient identification                            for the specimen was done. Estimated blood loss was                            minimal.                           A small hiatal hernia was present.                           A few erosions were found on the greater curvature                            of the stomach. There were no stigmata of recent                            bleeding.                           The exam was otherwise  without abnormality.                           The cardia and gastric fundus were normal on                            retroflexion. Complications:            No immediate complications. Estimated Blood Loss:     Estimated blood loss was minimal. Impression:               - Esophageal ulcer. Biopsied.                           - Small hiatal hernia.                           - Erosive gastropathy. Mild                           - The examination was otherwise normal. Recommendation:           - Patient has a contact number available for                            emergencies. The signs and symptoms of potential                            delayed complications were discussed with the                            patient. Return to normal activities tomorrow.                            Written discharge instructions were provided to the                            patient.                           - Continue present medications.                           - Await pathology results.                            - Gastroparesis diet.                           - Use Protonix (pantoprazole) 40 mg PO daily. Rx                            sent. Gatha Mayer, MD 08/22/2018 10:02:10 AM This report has been signed electronically.

## 2018-08-22 NOTE — Patient Instructions (Addendum)
There was a small ulcer where the esophagus and stomach meet and some stomach erosions.  I am starting pantoprazole to heal this and hopefully prevent further problems like this.  I took biopsies to be sure it is from reflux - no signs of tumor or cancer to the eye.  I appreciate the opportunity to care for you. Gatha Mayer, MD, [facility]   Follow a gastroparesis diet, pick up new prescription for protonix at your pharmacy today.  YOU HAD AN ENDOSCOPIC PROCEDURE TODAY AT [facility]:   Refer to the procedure report that was given to you for any specific questions about what was found during the examination.  If the procedure report does not answer your questions, please call your gastroenterologist to clarify.  If you requested that your care partner not be given the details of your procedure findings, then the procedure report has been included in a sealed envelope for you to review at your convenience later.  YOU SHOULD EXPECT: Some feelings of bloating in the abdomen. Passage of more gas than usual.  Walking can help get rid of the air that was put into your GI tract during the procedure and reduce the bloating. If you had a lower endoscopy (such as a colonoscopy or flexible sigmoidoscopy) you may notice spotting of blood in your stool or on the toilet paper. If you underwent a bowel prep for your procedure, you may not have a normal bowel movement for a few days.  Please Note:  You might notice some irritation and congestion in your nose or some drainage.  This is from the oxygen used during your procedure.  There is no need for concern and it should clear up in a day or so.  SYMPTOMS TO REPORT IMMEDIATELY:   Following upper endoscopy (EGD)  Vomiting of blood or coffee ground material  New chest pain or pain under the shoulder blades  Painful or persistently difficult swallowing  New shortness of breath  Fever of 100F or higher  Black, tarry-looking  stools  For urgent or emergent issues, a gastroenterologist can be reached at any hour by calling [phone].   DIET:  We do recommend a small meal at first, but then you may proceed to your regular diet.  Drink plenty of fluids but you should avoid alcoholic beverages for 24 hours.  ACTIVITY:  You should plan to take it easy for the rest of today and you should NOT DRIVE or use heavy machinery until tomorrow (because of the sedation medicines used during the test).    FOLLOW UP: Our staff will call the number listed on your records the next business day following your procedure to check on you and address any questions or concerns that you may have regarding the information given to you following your procedure. If we do not reach you, we will leave a message.  However, if you are feeling well and you are not experiencing any problems, there is no need to return our call.  We will assume that you have returned to your regular daily activities without incident.  If any biopsies were taken you will be contacted by phone or by letter within the next 1-3 weeks.  Please call us at [phone] if you have not heard about the biopsies in 3 weeks.    SIGNATURES/CONFIDENTIALITY: You and/or your care partner have signed paperwork which will be entered into your electronic medical record.  These signatures attest to the fact that  that the information above on your After Visit Summary has been reviewed and is understood.  Full responsibility of the confidentiality of this discharge information lies with you and/or your care-partner.

## 2018-08-23 ENCOUNTER — Telehealth: Payer: Self-pay | Admitting: *Deleted

## 2018-08-23 IMAGING — RF DG UGI W/ HIGH DENSITY W/KUB
12 of 15 series · 12 of 24 positions shown · non-contrast
Comparison: 09/10/2015

CLINICAL DATA: Partial gastrectomy for GI stromal tumor,
01/11/2016. Nausea and diarrhea since the surgery.

EXAM:
UPPER GI SERIES WITH KUB
TECHNIQUE: After obtaining a scout radiograph a routine upper GI series was
performed using thickened thin barium.
FLUOROSCOPY TIME:  Radiation Exposure Index (as provided by the
fluoroscopic device): 590 microGy*m^2

[Series 2: cp_standard · 0.34mm/px · 1 of 58 frames shown (1 of 11)]
[frame 20/58]
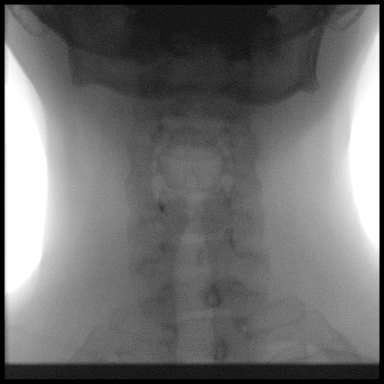

[Series 3: cp_standard · 0.34mm/px · 1 of 47 frames shown (2 of 11)]
[frame 38/47]
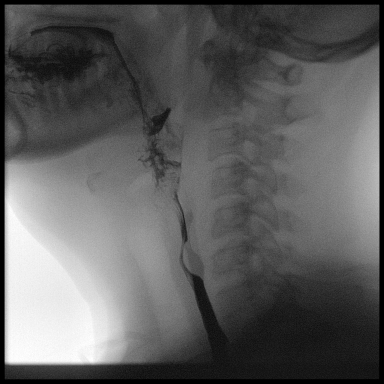

[Series 4: cp_standard · 0.34mm/px · 1 of 78 frames shown (3 of 11)]
[frame 67/78]
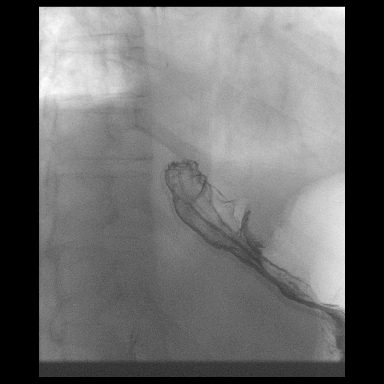

[Series 8: fluoro_barium 2fps_bw · 0.18mm/px · 1 of 1 slices shown]
[im 1/1]
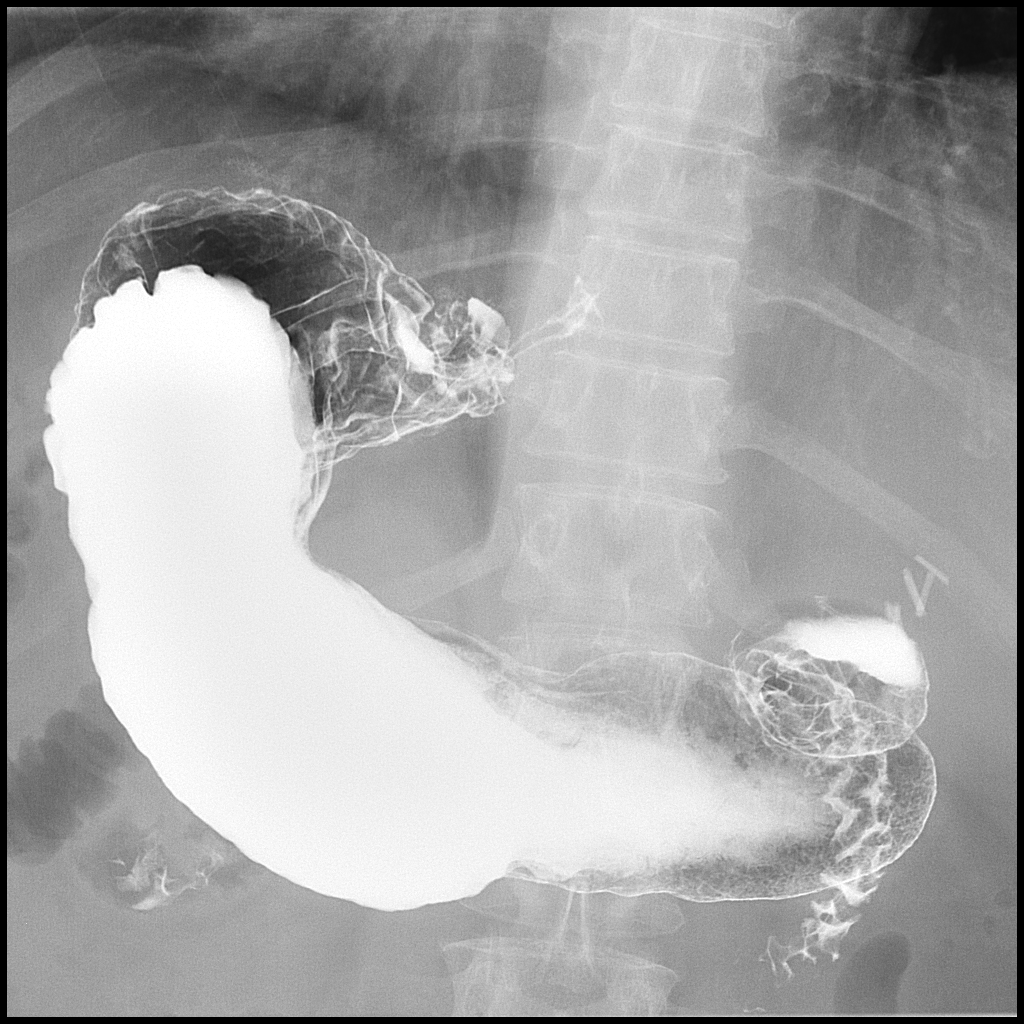

[Series 10: cp_standard · 0.37mm/px · 1 of 36 frames shown (4 of 11)]
[frame 31/36]
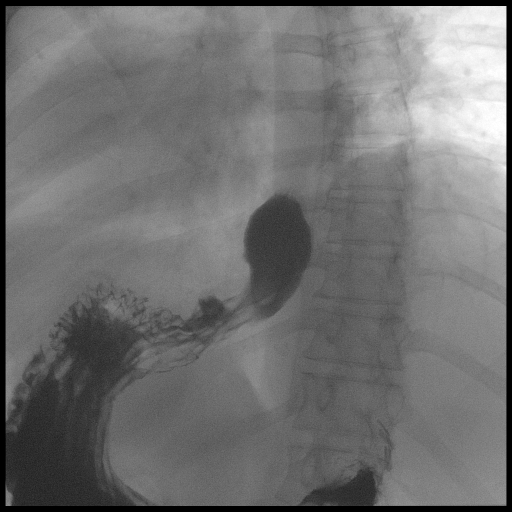

[Series 11: cp_standard · 0.37mm/px · 1 of 38 frames shown (5 of 11)]
[frame 33/38]
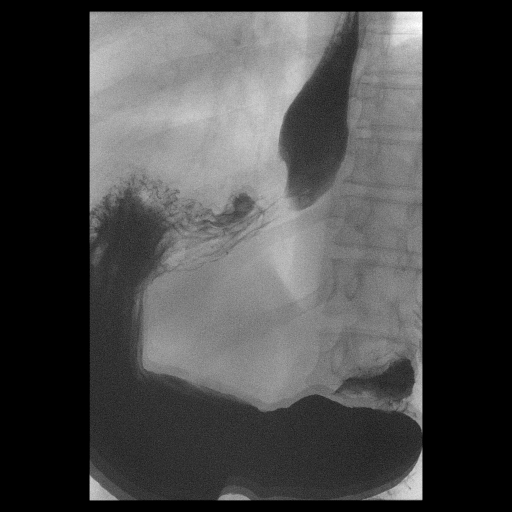

[Series 12: cp_standard · 0.37mm/px · 1 of 39 frames shown (6 of 11)]
[frame 34/39]
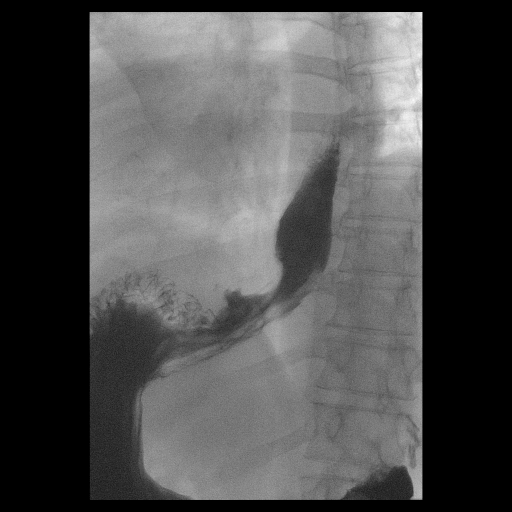

[Series 14: cp_standard · 0.37mm/px · 1 of 15 frames shown (7 of 11)]
[frame 2/15]
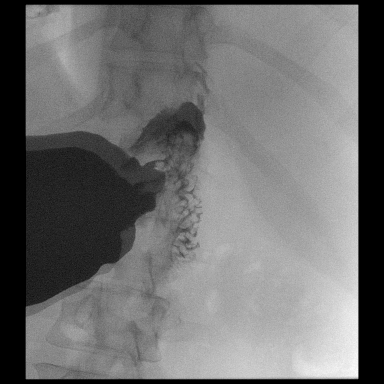

[Series 15: cp_standard · 0.37mm/px · 1 of 35 frames shown (8 of 11)]
[frame 6/35]
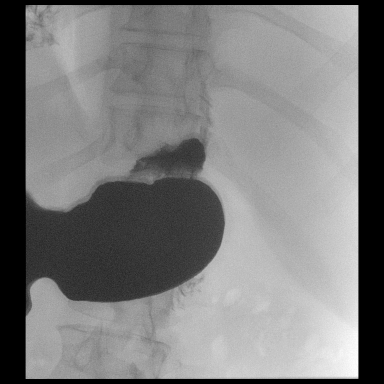

[Series 16: cp_standard · 0.37mm/px · 1 of 5 frames shown (9 of 11)]
[frame 3/5]
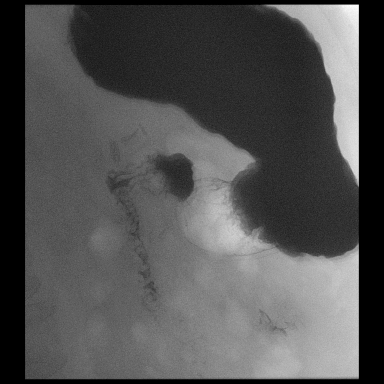

[Series 17: cp_standard · 0.37mm/px · 1 of 23 frames shown (10 of 11)]
[frame 12/23]
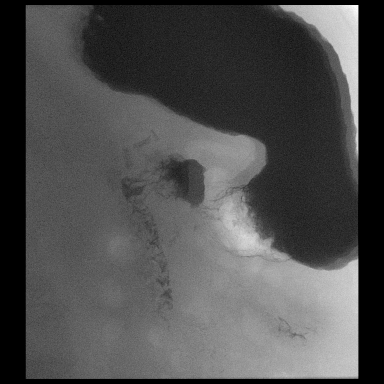

[Series 18: cp_standard · 0.38mm/px · 1 of 26 frames shown (11 of 11)]
[frame 23/26]
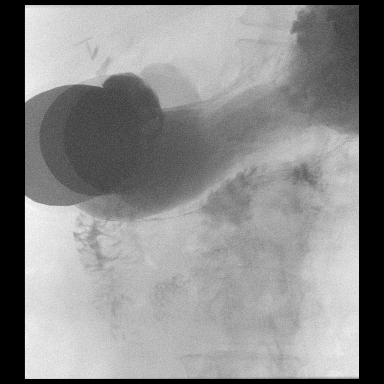

[12 of 24 positions shown; findings below may reference images not displayed]

FINDINGS: Initial KUB demonstrates cholecystectomy clips and a known
calcification along the right omentum. Unremarkable bowel gas
pattern. Postoperative clips noted along the stomach cardia.

The pharyngeal phase of swallowing appears normal. Primary
peristaltic waves in the esophagus were normal on [DATE]
swallows.

There is a small type 1 hiatal hernia shown on series 5. No
significant esophageal stricture or irregularity. Mild cephalad
outpouching of the hiatal hernia, for example on image [DATE].
Shelf-like filling defect along the cardia of the stomach in the
expected location of the tumor resection, without visible leak.

The remainder the stomach appears unremarkable. There is sluggish
emptying of the duodenal bulb which resulted in less than optimal
duodenal bulb characterization, but no significant abnormality in
this vicinity is identified. A 13 mm barium pill passed into the
stomach without difficulty.
IMPRESSION: 1. At the site of the original tumor were there are now
postoperative findings, there is a shelf-like filling defect which
may simply be due to the partial gastrectomy site. This is shown for
example on image 28/13. Small type 1 hiatal hernia which contains
part of the postoperative site, and this leads to a appearance of
outpouching along the operative margin which could represent a small
ulceration, for example on image 21/13. Correlate with any GI
bleeding.

## 2018-08-23 NOTE — Telephone Encounter (Signed)
  Follow up Call-  Call back number 08/22/2018 04/17/2016  Post procedure Call Back phone  # [phone]  Permission to leave phone message Yes Yes  Some recent data might be hidden     Patient questions:  Do you have a fever, pain , or abdominal swelling? No. Pain Score  0 *  Have you tolerated food without any problems? Yes.    Have you been able to return to your normal activities? Yes.    Do you have any questions about your discharge instructions: Diet   No. Medications  No. Follow up visit  No.  Do you have questions or concerns about your Care? No.  Actions: * If pain score is 4 or above: No action needed, pain <4.

## 2018-08-27 ENCOUNTER — Encounter: Payer: Self-pay | Admitting: Internal Medicine

## 2018-08-27 NOTE — Progress Notes (Signed)
Benign GE junction ulcer No recall My Chart letter

## 2018-09-03 MED FILL — EDARBYCLOR 40-12.5 MG TAB: 40-12.5 | 30 days supply | Qty: 30 | Fill #1

## 2018-09-16 MED FILL — VASCEPA 1 GM CAPSULE: 1 | 90 days supply | Qty: 360 | Fill #1

## 2018-09-26 ENCOUNTER — Encounter: Payer: Self-pay | Admitting: Internal Medicine

## 2018-09-26 ENCOUNTER — Ambulatory Visit (INDEPENDENT_AMBULATORY_CARE_PROVIDER_SITE_OTHER): Payer: No Typology Code available for payment source | Admitting: Internal Medicine

## 2018-09-26 ENCOUNTER — Other Ambulatory Visit: Payer: Self-pay

## 2018-09-26 VITALS — BP 134/80 | HR 87 | Temp 97.9°F | Ht 58.6 in | Wt 154.8 lb

## 2018-09-26 DIAGNOSIS — E1121 Type 2 diabetes mellitus with diabetic nephropathy: Secondary | ICD-10-CM | POA: Diagnosis not present

## 2018-09-26 DIAGNOSIS — R609 Edema, unspecified: Secondary | ICD-10-CM | POA: Diagnosis not present

## 2018-09-26 NOTE — Progress Notes (Signed)
Subjective:     Patient ID: Nichole Wilson , female    DOB: 01-10-1961 , 58 y.o.   MRN: [id number]   Chief Complaint  Patient presents with  . Edema    all over body-not painful-09/23/18-    HPI  Onset of bilateral leg swelling 4 days ago and admits of falling asleep on the recliner til 2 am, since then the swelling has persisted and noticed her abdomen being distended, hands swelling and face. She has not been eating much salt, but has been eating more beef than she is used to.   Past Medical History:  Diagnosis Date  . Breast cyst, left 01-04-12   cyst no longer apparent  . Cancer [facility])    right breast cancer  . Cholelithiasis with choledocholithiasis   . Chronic calculus cholecystitis 12/25/2011   Lap chole on 03/25/12   . Complication of anesthesia 2002   PT STATES DURING HER C-SECTION SHE WAS AWARE OF INCISION AND PAIN -THEN WAS QUICKLY "PUT OUT"  . Family history of adverse reaction to anesthesia    father became aggressive  . Gallstone pancreatitis   . Glucose intolerance (impaired glucose tolerance)    since at least 2011.   [name] Hepatic steatosis   . Hypercholesterolemia with hypertriglyceridemia   . Hypertension 01-04-12   med d/c-last taken 12-07-11  -PT [place] STOPPED HER B/P MEDICATION BECAUSE HER B/P TOO LOW AND PT STATES HER B/P'S HAVE BEEN OK SINCE.  [name] Hypertriglyceridemia   . Migraine without aura, without mention of intractable migraine without mention of status migrainosus   . PONV (postoperative nausea and vomiting)    patient states that she did not get sick with mastectomy  . Pre-diabetes      Family History  Problem Relation Age of Onset  . Diabetes Mother   . Diabetes Father   . Diabetes Brother   . Heart disease Paternal Uncle   . Liver disease Brother   . Colon cancer Neg Hx      Current Outpatient Medications:  .  aspirin 81 MG tablet, Take 325 mg by mouth daily. , Disp: , Rfl:  .  Cholecalciferol (VITAMIN D3) 10000 units  capsule, Take 10,000 Units by mouth daily., Disp: , Rfl:  .  EDARBYCLOR 40-12.5 MG TABS, Take 40 each by mouth daily., Disp: , Rfl:  .  Icosapent Ethyl (VASCEPA) 1 g CAPS, Take 2 capsules (2 g total) by mouth 2 (two) times daily., Disp: 360 capsule, Rfl: 1 .  Multiple Vitamins-Minerals (MULTIVITAMIN ADULT PO), Take by mouth., Disp: , Rfl:  .  pantoprazole (PROTONIX) 40 MG tablet, Take 1 tablet (40 mg total) by mouth daily before breakfast., Disp: 90 tablet, Rfl: 3 .  RESTASIS 0.05 % ophthalmic emulsion, , Disp: , Rfl: 2 .  anastrozole (ARIMIDEX) 1 MG tablet, TAKE 1 TABLET BY MOUTH DAILY. (Patient not taking: Reported on 09/26/2018), Disp: 90 tablet, Rfl: 0 .  nebivolol (BYSTOLIC) 5 MG tablet, Take 1 tablet (5 mg total) by mouth daily. (Patient not taking: Reported on 08/22/2018), Disp: 90 tablet, Rfl: 1 .  pyridoxine (B-6) 250 MG tablet, Take 250 mg by mouth daily., Disp: , Rfl:  .  vitamin B-12 (CYANOCOBALAMIN) 1000 MCG tablet, Take 1,000 mcg by mouth daily., Disp: , Rfl:   Current Facility-Administered Medications:  .  0.9 %  sodium chloride infusion, 500 mL, Intravenous, Continuous, Gatha Mayer, MD   Allergies  Allergen Reactions  . Lisinopril Swelling  . Other  General anesthesia-severe vomiting     Review of Systems  Constitutional: Negative for chills, diaphoresis, fatigue and fever.  Respiratory: Negative for cough, chest tightness and shortness of breath.        Denies orthopnea or PND  Cardiovascular: Positive for leg swelling. Negative for chest pain and palpitations.  Gastrointestinal: Positive for abdominal distention and constipation. Negative for abdominal pain, diarrhea, nausea and vomiting.       Has had intermittent constipation, but was related to supplements she was taking which she stopped and this has improved.   Genitourinary: Negative for decreased urine volume and difficulty urinating.  Musculoskeletal: Negative for gait problem, joint swelling and myalgias.   Skin: Negative for rash.     Today's Vitals   09/26/18 1455  BP: 134/80  Pulse: 87  Temp: 97.9 F (36.6 C)  TempSrc: Oral  SpO2: 97%  Weight: 154 lb 12.8 oz (70.2 kg)  Height: 4' 10.6" (1.488 m)   Body mass index is 31.69 kg/m.   Objective:  Physical Exam Vitals signs and nursing note reviewed.  Constitutional:      General: She is not in acute distress.    Appearance: She is not toxic-appearing.  HENT:     Head: Normocephalic.     Right Ear: External ear normal.     Left Ear: External ear normal.     Nose: Nose normal.  Eyes:     General: No scleral icterus.    Conjunctiva/sclera: Conjunctivae normal.  Neck:     Musculoskeletal: Neck supple. No neck rigidity.  Cardiovascular:     Rate and Rhythm: Normal rate and regular rhythm.     Pulses: Normal pulses.     Heart sounds: No murmur.     Comments: +2/4 pitting edema of both ankles up to lower shins. Her R hand looks a little more swollen than the L and her L ring finger rings are tight.  Abdominal:     General: Bowel sounds are normal.     Palpations: Abdomen is soft.     Tenderness: There is no abdominal tenderness. There is no guarding or rebound.     Hernia: No hernia is present.  Lymphadenopathy:     Cervical: No cervical adenopathy.  Skin:    General: Skin is warm and dry.     Findings: No rash.  Neurological:     Mental Status: She is alert and oriented to person, place, and time.     Gait: Gait normal.  Psychiatric:        Mood and Affect: Mood normal.        Behavior: Behavior normal.        Thought Content: Thought content normal.        Judgment: Judgment normal.     Assessment And Plan:   1. Controlled type 2 diabetes mellitus with diabetic nephropathy, without long-term current use of insulin ([place])- chronic. Will remain on same meds.  - Hemoglobin A1c  2. Edema, unspecified type- acute. Advised to avoid sodium, elevate legs above her heart, get support hoses to wear qd and eat water mellow  which is a natural diuretic. Ones her CMP is back and if normal, I can send a few days of diuretic.  - CMP14 + Anion Gap     Debora Stockdale [name], PA-C

## 2018-09-26 NOTE — Patient Instructions (Signed)
  Get support hoses and wear them if you are going to be up on your feet all day. Elevate your legs above your heart.    Edema  Edema is when you have too much fluid in your body or under your skin. Edema may make your legs, feet, and ankles swell up. Swelling is also common in looser tissues, like around your eyes. This is a common condition. It gets more common as you get older. There are many possible causes of edema. Eating too much salt (sodium) and being on your feet or sitting for a long time can cause edema in your legs, feet, and ankles. Hot weather may make edema worse. Edema is usually painless. Your skin may look swollen or shiny. Follow these instructions at home:  Keep the swollen body part raised (elevated) above the level of your heart when you are sitting or lying down.  Do not sit still or stand for a long time.  Do not wear tight clothes. Do not wear garters on your upper legs.  Exercise your legs. This can help the swelling go down.  Wear elastic bandages or support stockings as told by your doctor.  Eat a low-salt (low-sodium) diet to reduce fluid as told by your doctor.  Depending on the cause of your swelling, you may need to limit how much fluid you drink (fluid restriction).  Take over-the-counter and prescription medicines only as told by your doctor. Contact a doctor if:  Treatment is not working.  You have heart, liver, or kidney disease and have symptoms of edema.  You have sudden and unexplained weight gain. Get help right away if:  You have shortness of breath or chest pain.  You cannot breathe when you lie down.  You have pain, redness, or warmth in the swollen areas.  You have heart, liver, or kidney disease and get edema all of a sudden.  You have a fever and your symptoms get worse all of a sudden. Summary  Edema is when you have too much fluid in your body or under your skin.  Edema may make your legs, feet, and ankles swell up.  Swelling is also common in looser tissues, like around your eyes.  Raise (elevate) the swollen body part above the level of your heart when you are sitting or lying down.  Follow your doctor's instructions about diet and how much fluid you can drink (fluid restriction). This information is not intended to replace advice given to you by your health care provider. Make sure you discuss any questions you have with your health care provider. Document Released: 12/20/2007 Document Revised: 07/21/2016 Document Reviewed: 07/21/2016 Elsevier Interactive Patient Education  2019 [employer].

## 2018-09-27 LAB — CMP14 + ANION GAP
ALK PHOS: 145 IU/L — AB (ref 39–117)
ALT: 69 IU/L — ABNORMAL HIGH (ref 0–32)
AST: 45 IU/L — ABNORMAL HIGH (ref 0–40)
Albumin/Globulin Ratio: 1.5 (ref 1.2–2.2)
Albumin: 3.4 g/dL — ABNORMAL LOW (ref 3.8–4.9)
Anion Gap: 15 mmol/L (ref 10.0–18.0)
BUN/Creatinine Ratio: 18 (ref 9–23)
BUN: 11 mg/dL (ref 6–24)
Bilirubin Total: 0.2 mg/dL (ref 0.0–1.2)
CO2: 24 mmol/L (ref 20–29)
Calcium: 8.6 mg/dL — ABNORMAL LOW (ref 8.7–10.2)
Chloride: 101 mmol/L (ref 96–106)
Creatinine, Ser: 0.62 mg/dL (ref 0.57–1.00)
GFR calc Af Amer: 117 mL/min/{1.73_m2} (ref >59)
GFR calc non Af Amer: 102 mL/min/{1.73_m2} (ref >59)
GLOBULIN, TOTAL: 2.2 g/dL (ref 1.5–4.5)
Glucose: 150 mg/dL — ABNORMAL HIGH (ref 65–99)
Potassium: 4.7 mmol/L (ref 3.5–5.2)
Sodium: 140 mmol/L (ref 134–144)
Total Protein: 5.6 g/dL — ABNORMAL LOW (ref 6.0–8.5)

## 2018-09-27 LAB — HEMOGLOBIN A1C
Est. average glucose Bld gHb Est-mCnc: 140 mg/dL
Hgb A1c MFr Bld: 6.5 % — ABNORMAL HIGH (ref 4.8–5.6)

## 2018-09-27 NOTE — Progress Notes (Signed)
Results reviewed - thanks  CT in 2008 showed hepatic steatosis That and Arimidex use are likely causing these changes  If she is having biliary colic sxs could repeat an [place] but gallstones alone will not cause the enzyme abnormalities  Calcium may be NL w/ slightly low albumin but supplements ok  I am happy to see her if requested but monitoring the LFT's and asking about sxs makes most sense - do not hesitate to send other results/ask ?'s  FYI a statin is NOT contraindicated in her case based upon abnl LFT's and fatty liver - just monitor LFT's

## 2018-10-02 ENCOUNTER — Other Ambulatory Visit: Payer: Self-pay

## 2018-10-02 MED ORDER — HYDROCHLOROTHIAZIDE 12.5 MG PO CAPS: 12.5000 mg | ORAL_CAPSULE | Freq: Every day | ORAL | 0 refills | Status: DC

## 2018-10-02 MED FILL — HYDROCHLOROTHIAZIDE 12.5 MG: 12.5 | 7 days supply | Qty: 7 | Fill #0

## 2018-10-04 ENCOUNTER — Other Ambulatory Visit: Payer: Self-pay | Admitting: Internal Medicine

## 2018-10-04 ENCOUNTER — Telehealth: Payer: Self-pay

## 2018-10-04 DIAGNOSIS — E781 Pure hyperglyceridemia: Secondary | ICD-10-CM

## 2018-10-04 NOTE — Telephone Encounter (Signed)
Pt came in lobby requesting to follow up with Dr.Gudena regarding current labs from pcp and anastrozole symptoms. Made appt for Monday and confirmed time/date.

## 2018-10-07 ENCOUNTER — Other Ambulatory Visit: Payer: Self-pay

## 2018-10-07 ENCOUNTER — Inpatient Hospital Stay
Payer: No Typology Code available for payment source | Attending: Hematology and Oncology | Admitting: Hematology and Oncology

## 2018-10-07 VITALS — BP 134/84 | HR 102 | Temp 97.7°F | Resp 117 | Ht 58.6 in | Wt 149.6 lb

## 2018-10-07 DIAGNOSIS — Z170 Estrogen receptor positive status [ER+]: Secondary | ICD-10-CM | POA: Insufficient documentation

## 2018-10-07 DIAGNOSIS — C50411 Malignant neoplasm of upper-outer quadrant of right female breast: Secondary | ICD-10-CM | POA: Diagnosis not present

## 2018-10-07 DIAGNOSIS — R5383 Other fatigue: Secondary | ICD-10-CM | POA: Diagnosis not present

## 2018-10-07 DIAGNOSIS — M5489 Other dorsalgia: Secondary | ICD-10-CM | POA: Diagnosis present

## 2018-10-07 DIAGNOSIS — R601 Generalized edema: Secondary | ICD-10-CM | POA: Diagnosis not present

## 2018-10-07 DIAGNOSIS — Z9011 Acquired absence of right breast and nipple: Secondary | ICD-10-CM

## 2018-10-07 DIAGNOSIS — M549 Dorsalgia, unspecified: Secondary | ICD-10-CM

## 2018-10-07 NOTE — Progress Notes (Signed)
Patient Care Team: [name], Sandrea Matte as PCP - General (Internal Medicine)  DIAGNOSIS:  Encounter Diagnoses  Name Primary?  . Malignant neoplasm of upper-outer quadrant of right breast in female, estrogen receptor positive ([place])   . Acute upper back pain Yes    SUMMARY OF ONCOLOGIC HISTORY:   Breast cancer of upper-outer quadrant of right female breast ([place])   11/15/2012 Imaging    DEXA scan: Normal. T-score -0.7    08/10/2015 Breast MRI    Enhancing mass in right breast upper outer quadrant 8 x 5 x 7 mm, no retroareolar right breast abnormality, no abnormality of the site of nipple retraction    08/18/2015 Initial Biopsy    Right breast biopsy: Invasive ductal carcinoma with DCIS, grade 2 with focal necrosis,ER 100%, PR 0%, Ki-67 5%,HER-2 positive ratio 4.52    08/19/2015 Mammogram    Right breast calcifications now seem to span 4 x 2 x 1.5 cm    09/15/2015 Surgery    Rt mastectomy [name]): IDC Grade 2, 0.18 cm, with DCIS, 0/1 LN, ER 100%, PR 0%, HER2 Positive 4.52, T1aN0M0 (Stage 1A)    10/12/2015 - 11/23/2015 Chemotherapy    Herceptin every 3 weeks 3 cycles (based on [facility] trial: Short herceptin protocol)    11/2015 - 09/02/2018 Anti-estrogen oral therapy    Anastrazole 1 mg daily stopped 09/04/2016 due to severe fatigue resumed May 2018     CHIEF COMPLIANT: Upper back pain, profound fatigue, recent leg cramps  INTERVAL HISTORY: Nichole Wilson is a 58 year old with above-mentioned history of right breast cancer who is currently on tamoxifen.  She stopped tamoxifen about a month ago because she was not feeling well.  She came in today for an urgent visit because she was experiencing upper back pain that is constant.  She is also recently had severe cramps that lasted for 2 hours.  She attributed this to her blood pressure medication.  She had gained significant amount of fluid weight and had been following with her primary care physician for this.  She is always  had fatigue issues.  It appears that recently these have gotten worse as well.  She had finally made a decision to stop tamoxifen therapy.  REVIEW OF SYSTEMS:   Constitutional: Denies fevers, chills or abnormal weight loss, complaining of swelling of the body. Eyes: Denies blurriness of vision Ears, nose, mouth, throat, and face: Denies mucositis or sore throat Respiratory: Denies cough, dyspnea or wheezes Cardiovascular: Denies palpitation, chest discomfort Gastrointestinal:  Denies nausea, heartburn or change in bowel habits Skin: Denies abnormal skin rashes Lymphatics: Denies new lymphadenopathy or easy bruising Neurological:Denies numbness, tingling or new weaknesses Behavioral/Psych: Mood is stable, no new changes  Extremities: No lower extremity edema Breast:  denies any pain or lumps or nodules in either breasts All other systems were reviewed with the patient and are negative.  I have reviewed the past medical history, past surgical history, social history and family history with the patient and they are unchanged from previous note.  ALLERGIES:  is allergic to lisinopril and other.  MEDICATIONS:  Current Outpatient Medications  Medication Sig Dispense Refill  . aspirin 81 MG tablet Take 325 mg by mouth daily.     . Cholecalciferol (VITAMIN D3) 10000 units capsule Take 10,000 Units by mouth daily.    [name] EDARBYCLOR 40-12.5 MG TABS Take 40 each by mouth daily.    . hydrochlorothiazide (MICROZIDE) 12.5 MG capsule Take 1 capsule (12.5 mg total) by mouth daily. 7  capsule 0  . Icosapent Ethyl (VASCEPA) 1 g CAPS Take 2 capsules (2 g total) by mouth 2 (two) times daily. 360 capsule 1  . Multiple Vitamins-Minerals (MULTIVITAMIN ADULT PO) Take by mouth.    . pantoprazole (PROTONIX) 40 MG tablet Take 1 tablet (40 mg total) by mouth daily before breakfast. 90 tablet 3  . pyridoxine (B-6) 250 MG tablet Take 250 mg by mouth daily.    . RESTASIS 0.05 % ophthalmic emulsion   2  . vitamin B-12  (CYANOCOBALAMIN) 1000 MCG tablet Take 1,000 mcg by mouth daily.     Current Facility-Administered Medications  Medication Dose Route Frequency Provider Last Rate Last Dose  . 0.9 %  sodium chloride infusion  500 mL Intravenous Continuous Gatha Mayer, MD        PHYSICAL EXAMINATION: ECOG PERFORMANCE STATUS: 1 - Symptomatic but completely ambulatory  Vitals:   10/07/18 0957  BP: 134/84  Pulse: (!) 102  Resp: (!) 117  Temp: 97.7 F (36.5 C)  SpO2: 100%   Filed Weights   10/07/18 0957  Weight: 149 lb 9.6 oz (67.9 kg)    GENERAL:alert, no distress and comfortable SKIN: skin color, texture, turgor are normal, no rashes or significant lesions EYES: normal, Conjunctiva are pink and non-injected, sclera clear OROPHARYNX:no exudate, no erythema and lips, buccal mucosa, and tongue normal  NECK: supple, thyroid normal size, non-tender, without nodularity LYMPH:  no palpable lymphadenopathy in the cervical, axillary or inguinal LUNGS: clear to auscultation and percussion with normal breathing effort HEART: regular rate & rhythm and no murmurs and no lower extremity edema ABDOMEN:abdomen soft, non-tender and normal bowel sounds MUSCULOSKELETAL:no cyanosis of digits and no clubbing  NEURO: alert & oriented x 3 with fluent speech, no focal motor/sensory deficits EXTREMITIES: No lower extremity edema   LABORATORY DATA:  I have reviewed the data as listed CMP Latest Ref Rng & Units 09/26/2018 08/07/2018 08/07/2018  Glucose 65 - 99 mg/dL 150(H) - 126(H)  BUN 6 - 24 mg/dL 11 - 17  Creatinine 0.57 - 1.00 mg/dL 0.62 - 0.83  Sodium 134 - 144 mmol/L 140 - 141  Potassium 3.5 - 5.2 mmol/L 4.7 - 4.8  Chloride 96 - 106 mmol/L 101 - 99  CO2 20 - 29 mmol/L 24 - 25  Calcium 8.7 - 10.2 mg/dL 8.6(L) - 10.1  Total Protein 6.0 - 8.5 g/dL 5.6(L) - 7.9  Total Bilirubin 0.0 - 1.2 mg/dL 0.2 - 0.7  Alkaline Phos 39 - 117 IU/L 145(H) 172(H) 165(H)  AST 0 - 40 IU/L 45(H) - 31  ALT 0 - 32 IU/L 69(H) -  54(H)    Lab Results  Component Value Date   WBC 6.6 08/07/2018   HGB 14.0 08/07/2018   HCT 39.8 08/07/2018   MCV 91 08/07/2018   PLT 328 08/07/2018   NEUTROABS 3.7 06/10/2018    ASSESSMENT & PLAN:  Breast cancer of upper-outer quadrant of right female breast ([place]) Rt mastectomy 09/15/15: IDC Grade 2, 0.18 cm, with DCIS, 0/1 LN, ER 100%, PR 0%, HER2 Positive 4.52, T1aN0M0 (Stage 1A) Final tumor size: 0.35 cm on biopsy +0.18 cm on final lumpectomy equals 0.53 cm  Treatment plan: 1. shorter duration of anti-HER-2 therapy based upon [facility] trial where they used only 9 weeks of Herceptin treatment. Herceptin q 3 weeks 3 cycles.  2. Adjuvant antiestrogen therapy with anastrozole 1 mg daily started 11/15/2015, stopped 09/04/2016 due to severe fatigue Resumed March 2018 stopped February 2020  Breast cancer surveillance:  1. Mammograms  02/22/2018: No suspicious masses breast density category C  2. Breast MRI  03/16/2017: No evidence of any cancer 3. CT abdomen and pelvis: No evidence of metastatic disease: Include diverticulosis, fatty liver  Upper back pain: Unclear etiology.  I am concerned about bone metastases.  We will obtain a CT chest for further evaluation.  I will call her with the result of this test. Severe fatigue: I instructed her to follow with her primary care physician. Anasarca: I suspect that the patient may need thorough work-up including cardiac work-up.  She will discuss with her PCP.  Return to clinic in 1 year for follow-up    Orders Placed This Encounter  Procedures  . CT Chest W Contrast    Please allow her testing to go on as this may reveal the cause of her pain and make sure its not Metastatic disease    Standing Status:   Future    Standing Expiration Date:   10/07/2019    Order Specific Question:   ** REASON FOR EXAM (FREE TEXT)    Answer:   Upper back pain with H/O breast cancer worrisome for metastasis    Order Specific Question:   If indicated for  the ordered procedure, I authorize the administration of contrast media per Radiology protocol    Answer:   Yes    Order Specific Question:   Is patient pregnant?    Answer:   No    Order Specific Question:   Preferred imaging location?    Answer:   [facility]    Order Specific Question:   Radiology Contrast Protocol - do NOT remove file path    Answer:   [email or web address]   The patient has a good understanding of the overall plan. she agrees with it. she will call with any problems that may develop before the next visit here.   Harriette Ohara, MD 10/07/18

## 2018-10-07 NOTE — Assessment & Plan Note (Signed)
Rt mastectomy 09/15/15: IDC Grade 2, 0.18 cm, with DCIS, 0/1 LN, ER 100%, PR 0%, HER2 Positive 4.52, T1aN0M0 (Stage 1A) Final tumor size: 0.35 cm on biopsy +0.18 cm on final lumpectomy equals 0.53 cm  Treatment plan: 1. shorter duration of anti-HER-2 therapy based upon [facility] trial where they used only 9 weeks of Herceptin treatment. Herceptin q 3 weeks 3 cycles.  2. Adjuvant antiestrogen therapy with anastrozole 1 mg daily started 11/15/2015, stopped 09/04/2016 due to severe fatigue Resumed March 2018  Breast cancer surveillance:  1. Mammograms 02/22/2018: No suspicious masses breast density category C  2. Breast MRI  03/16/2017: No evidence of any cancer 3. CT abdomen and pelvis: No evidence of metastatic disease: Include diverticulosis, fatty liver  Return to clinic in 1 year for follow-up

## 2018-10-14 ENCOUNTER — Other Ambulatory Visit: Payer: Self-pay

## 2018-10-14 DIAGNOSIS — Z170 Estrogen receptor positive status [ER+]: Principal | ICD-10-CM

## 2018-10-14 DIAGNOSIS — C50411 Malignant neoplasm of upper-outer quadrant of right female breast: Secondary | ICD-10-CM

## 2018-10-18 ENCOUNTER — Other Ambulatory Visit: Payer: Self-pay | Admitting: Adult Health

## 2018-10-18 ENCOUNTER — Other Ambulatory Visit: Payer: Self-pay | Admitting: Emergency Medicine

## 2018-10-18 ENCOUNTER — Inpatient Hospital Stay: Payer: No Typology Code available for payment source | Attending: Hematology and Oncology

## 2018-10-18 ENCOUNTER — Inpatient Hospital Stay: Payer: No Typology Code available for payment source

## 2018-10-18 ENCOUNTER — Telehealth: Payer: Self-pay | Admitting: *Deleted

## 2018-10-18 ENCOUNTER — Other Ambulatory Visit: Payer: Self-pay | Admitting: Hematology and Oncology

## 2018-10-18 ENCOUNTER — Other Ambulatory Visit: Payer: Self-pay

## 2018-10-18 ENCOUNTER — Other Ambulatory Visit: Payer: Self-pay | Admitting: *Deleted

## 2018-10-18 DIAGNOSIS — E118 Type 2 diabetes mellitus with unspecified complications: Secondary | ICD-10-CM

## 2018-10-18 DIAGNOSIS — C50411 Malignant neoplasm of upper-outer quadrant of right female breast: Secondary | ICD-10-CM

## 2018-10-18 DIAGNOSIS — D649 Anemia, unspecified: Secondary | ICD-10-CM

## 2018-10-18 DIAGNOSIS — Z0000 Encounter for general adult medical examination without abnormal findings: Secondary | ICD-10-CM

## 2018-10-18 DIAGNOSIS — Z170 Estrogen receptor positive status [ER+]: Secondary | ICD-10-CM

## 2018-10-18 DIAGNOSIS — E663 Overweight: Secondary | ICD-10-CM

## 2018-10-18 LAB — CMP (CANCER CENTER ONLY)
ALT: 26 U/L (ref 0–44)
AST: 14 U/L — ABNORMAL LOW (ref 15–41)
Albumin: 3.1 g/dL — ABNORMAL LOW (ref 3.5–5.0)
Alkaline Phosphatase: 98 U/L (ref 38–126)
Anion gap: 9 (ref 5–15)
BUN: 12 mg/dL (ref 6–20)
CO2: 24 mmol/L (ref 22–32)
Calcium: 8.5 mg/dL — ABNORMAL LOW (ref 8.9–10.3)
Chloride: 108 mmol/L (ref 98–111)
Creatinine: 0.64 mg/dL (ref 0.44–1.00)
GFR, Est AFR Am: >60 mL/min (ref >60)
GFR, Estimated: >60 mL/min (ref >60)
Glucose, Bld: 123 mg/dL — ABNORMAL HIGH (ref 70–99)
Potassium: 4 mmol/L (ref 3.5–5.1)
Sodium: 141 mmol/L (ref 135–145)
Total Bilirubin: 0.3 mg/dL (ref 0.3–1.2)
Total Protein: 6.5 g/dL (ref 6.5–8.1)

## 2018-10-18 LAB — CBC WITH DIFFERENTIAL (CANCER CENTER ONLY)
Abs Immature Granulocytes: 0.05 10*3/uL (ref 0.00–0.07)
Basophils Absolute: 0 10*3/uL (ref 0.0–0.1)
Basophils Relative: 0 %
Eosinophils Absolute: 0.1 10*3/uL (ref 0.0–0.5)
Eosinophils Relative: 1 %
HCT: 25.7 % — ABNORMAL LOW (ref 36.0–46.0)
Hemoglobin: 7.4 g/dL — ABNORMAL LOW (ref 12.0–15.0)
Immature Granulocytes: 1 %
Lymphocytes Relative: 34 %
Lymphs Abs: 3.1 10*3/uL (ref 0.7–4.0)
MCH: 28 pg (ref 26.0–34.0)
MCHC: 28.8 g/dL — ABNORMAL LOW (ref 30.0–36.0)
MCV: 97.3 fL (ref 80.0–100.0)
Monocytes Absolute: 0.6 10*3/uL (ref 0.1–1.0)
Monocytes Relative: 6 %
Neutro Abs: 5.4 10*3/uL (ref 1.7–7.7)
Neutrophils Relative %: 58 %
Platelet Count: 456 10*3/uL — ABNORMAL HIGH (ref 150–400)
RBC: 2.64 MIL/uL — ABNORMAL LOW (ref 3.87–5.11)
RDW: 16.3 % — ABNORMAL HIGH (ref 11.5–15.5)
WBC Count: 9.2 10*3/uL (ref 4.0–10.5)
nRBC: 0.3 % — ABNORMAL HIGH (ref 0.0–0.2)

## 2018-10-18 LAB — PREPARE RBC (CROSSMATCH)

## 2018-10-18 LAB — VITAMIN B12: Vitamin B-12: 742 pg/mL (ref 180–914)

## 2018-10-18 LAB — IRON AND TIBC
Iron: 15 ug/dL — ABNORMAL LOW (ref 41–142)
Saturation Ratios: 4 % — ABNORMAL LOW (ref 21–57)
TIBC: 418 ug/dL (ref 236–444)
UIBC: 402 ug/dL — ABNORMAL HIGH (ref 120–384)

## 2018-10-18 LAB — HEMOGLOBIN A1C
Hgb A1c MFr Bld: 6 % — ABNORMAL HIGH (ref 4.8–5.6)
Mean Plasma Glucose: 125.5 mg/dL

## 2018-10-18 LAB — DIRECT ANTIGLOBULIN TEST (NOT AT ARMC)
DAT, IgG: NEGATIVE
DAT, complement: NEGATIVE

## 2018-10-18 LAB — CORTISOL: Cortisol, Plasma: 0.7 ug/dL

## 2018-10-18 LAB — SAMPLE TO BLOOD BANK

## 2018-10-18 LAB — FERRITIN: Ferritin: <4 ng/mL — ABNORMAL LOW (ref 11–307)

## 2018-10-18 NOTE — Progress Notes (Signed)
Patient is noted to have severe anemia with a hemoglobin of 7.4.  Previously her hemoglobin was 14.  She does not report any signs or symptoms of bleeding. Her MCV is also normal which makes it less likelihood that it is the bleeding related anemia. We will perform additional blood work today to rule out other causes of normocytic anemia. If no clear-cut causes identified then we will have to perform a bone marrow biopsy next week.

## 2018-10-18 NOTE — Telephone Encounter (Signed)
This RN spoke with pt per need for additional lab work requested by MD per noted heme of 7.4.  Appointment made for 2 pm today.

## 2018-10-19 ENCOUNTER — Inpatient Hospital Stay: Payer: No Typology Code available for payment source

## 2018-10-19 DIAGNOSIS — C50411 Malignant neoplasm of upper-outer quadrant of right female breast: Secondary | ICD-10-CM | POA: Diagnosis not present

## 2018-10-19 DIAGNOSIS — D649 Anemia, unspecified: Secondary | ICD-10-CM

## 2018-10-19 LAB — ABO/RH: ABO/RH(D): A POS

## 2018-10-19 LAB — HAPTOGLOBIN: Haptoglobin: 243 mg/dL (ref 33–346)

## 2018-10-19 MED ORDER — ACETAMINOPHEN 325 MG PO TABS
ORAL_TABLET | ORAL | Status: AC
  Filled 2018-10-19: qty 2

## 2018-10-19 MED ORDER — ACETAMINOPHEN 325 MG PO TABS
650.0000 mg | ORAL_TABLET | Freq: Once | ORAL | Status: AC
  Administered 2018-10-19: 650 mg via ORAL

## 2018-10-19 MED ORDER — SODIUM CHLORIDE 0.9% IV SOLUTION
250.0000 mL | Freq: Once | INTRAVENOUS | Status: AC
  Administered 2018-10-19: 250 mL via INTRAVENOUS
  Filled 2018-10-19: qty 250

## 2018-10-19 MED ORDER — DIPHENHYDRAMINE HCL 25 MG PO CAPS
25.0000 mg | ORAL_CAPSULE | Freq: Once | ORAL | Status: AC
  Administered 2018-10-19: 25 mg via ORAL

## 2018-10-19 MED ORDER — DIPHENHYDRAMINE HCL 25 MG PO CAPS
ORAL_CAPSULE | ORAL | Status: AC
  Filled 2018-10-19: qty 1

## 2018-10-19 NOTE — Patient Instructions (Addendum)
Blood Transfusion, Adult  A blood transfusion is a procedure in which you receive donated blood, including plasma, platelets, and red blood cells, through an IV tube. You may need a blood transfusion because of illness, surgery, or injury. The blood may come from a donor. You may also be able to donate blood for yourself (autologous blood donation) before a surgery if you know that you might require a blood transfusion. The blood given in a transfusion is made up of different types of cells. You may receive:  Red blood cells. These carry oxygen to the cells in the body.  White blood cells. These help you fight infections.  Platelets. These help your blood to clot.  Plasma. This is the liquid part of your blood and it helps with fluid imbalances. If you have hemophilia or another clotting disorder, you may also receive other types of blood products. Tell a health care provider about:  Any allergies you have.  All medicines you are taking, including vitamins, herbs, eye drops, creams, and over-the-counter medicines.  Any problems you or family members have had with anesthetic medicines.  Any blood disorders you have.  Any surgeries you have had.  Any medical conditions you have, including any recent fever or cold symptoms.  Whether you are pregnant or may be pregnant.  Any previous reactions you have had during a blood transfusion. What are the risks? Generally, this is a safe procedure. However, problems may occur, including:  Having an allergic reaction to something in the donated blood. Hives and itching may be symptoms of this type of reaction.  Fever. This may be a reaction to the white blood cells in the transfused blood. Nausea or chest pain may accompany a fever.  Iron overload. This can happen from having many transfusions.  Transfusion-related acute lung injury (TRALI). This is a rare reaction that causes lung damage. The cause is not known.TRALI can occur within hours  of a transfusion or several days later.  Sudden (acute) or delayed hemolytic reactions. This happens if your blood does not match the cells in your transfusion. Your body's defense system (immune system) may try to attack the new cells. This complication is rare. The symptoms include fever, chills, nausea, and low back pain or chest pain.  Infection or disease transmission. This is rare. What happens before the procedure?  You will have a blood test to determine your blood type. This is necessary to know what kind of blood your body will accept and to match it to the donor blood.  If you are going to have a planned surgery, you may be able to do an autologous blood donation. This may be done in case you need to have a transfusion.  If you have had an allergic reaction to a transfusion in the past, you may be given medicine to help prevent a reaction. This medicine may be given to you by mouth or through an IV tube.  You will have your temperature, blood pressure, and pulse monitored before the transfusion.  Follow instructions from your health care provider about eating and drinking restrictions.  Ask your health care provider about: ? Changing or stopping your regular medicines. This is especially important if you are taking diabetes medicines or blood thinners. ? Taking medicines such as aspirin and ibuprofen. These medicines can thin your blood. Do not take these medicines before your procedure if your health care provider instructs you not to. What happens during the procedure?  An IV tube will be   inserted into one of your veins.  The bag of donated blood will be attached to your IV tube. The blood will then enter through your vein.  Your temperature, blood pressure, and pulse will be monitored regularly during the transfusion. This monitoring is done to detect early signs of a transfusion reaction.  If you have any signs or symptoms of a reaction, your transfusion will be stopped and  you may be given medicine.  When the transfusion is complete, your IV tube will be removed.  Pressure may be applied to the IV site for a few minutes.  A bandage (dressing) will be applied. The procedure may vary among health care providers and hospitals. What happens after the procedure?  Your temperature, blood pressure, heart rate, breathing rate, and blood oxygen level will be monitored often.  Your blood may be tested to see how you are responding to the transfusion.  You may be warmed with fluids or blankets to maintain a normal body temperature. Summary  A blood transfusion is a procedure in which you receive donated blood, including plasma, platelets, and red blood cells, through an IV tube.  Your temperature, blood pressure, and pulse will be monitored before, during, and after the transfusion.  Your blood may be tested after the transfusion to see how your body has responded. This information is not intended to replace advice given to you by your health care provider. Make sure you discuss any questions you have with your health care provider. Document Released: 06/30/2000 Document Revised: 03/30/2016 Document Reviewed: 03/30/2016 Elsevier Interactive Patient Education  2019 [employer].  Blood Transfusion, Adult, Care After This sheet gives you information about how to care for yourself after your procedure. Your health care provider may also give you more specific instructions. If you have problems or questions, contact your health care provider. What can I expect after the procedure? After your procedure, it is common to have:  Bruising and soreness where the IV tube was inserted.  Headache. Follow these instructions at home:   Take over-the-counter and prescription medicines only as told by your health care provider.  Return to your normal activities as told by your health care provider.  Follow instructions from your health care provider about how to take  care of your IV insertion site. Make sure you: ? Wash your hands with soap and water before you change your bandage (dressing). If soap and water are not available, use hand sanitizer. ? Change your dressing as told by your health care provider.  Check your IV insertion site every day for signs of infection. Check for: ? More redness, swelling, or pain. ? More fluid or blood. ? Warmth. ? Pus or a bad smell. Contact a health care provider if:  You have more redness, swelling, or pain around the IV insertion site.  You have more fluid or blood coming from the IV insertion site.  Your IV insertion site feels warm to the touch.  You have pus or a bad smell coming from the IV insertion site.  Your urine turns pink, red, or brown.  You feel weak after doing your normal activities. Get help right away if:  You have signs of a serious allergic or immune system reaction, including: ? Itchiness. ? Hives. ? Trouble breathing. ? Anxiety. ? Chest or lower back pain. ? Fever, flushing, and chills. ? Rapid pulse. ? Rash. ? Diarrhea. ? Vomiting. ? Dark urine. ? Serious headache. ? Dizziness. ? Stiff neck. ? Yellow coloration of  the face or the white parts of the eyes (jaundice). This information is not intended to replace advice given to you by your health care provider. Make sure you discuss any questions you have with your health care provider. Document Released: 07/24/2014 Document Revised: 03/01/2016 Document Reviewed: 01/17/2016 Elsevier Interactive Patient Education  2019 [employer].

## 2018-10-20 LAB — ERYTHROPOIETIN: Erythropoietin: 232.8 m[IU]/mL — ABNORMAL HIGH (ref 2.6–18.5)

## 2018-10-21 ENCOUNTER — Ambulatory Visit (HOSPITAL_COMMUNITY)
Admission: RE | Admit: 2018-10-21 | Discharge: 2018-10-21 | Disposition: A | Discharge status: Home / Self Care | Payer: No Typology Code available for payment source | Source: Ambulatory Visit | Attending: Hematology and Oncology | Admitting: Hematology and Oncology

## 2018-10-21 ENCOUNTER — Other Ambulatory Visit: Payer: Self-pay

## 2018-10-21 DIAGNOSIS — Z170 Estrogen receptor positive status [ER+]: Secondary | ICD-10-CM | POA: Diagnosis present

## 2018-10-21 DIAGNOSIS — C50411 Malignant neoplasm of upper-outer quadrant of right female breast: Secondary | ICD-10-CM | POA: Diagnosis present

## 2018-10-21 DIAGNOSIS — M549 Dorsalgia, unspecified: Secondary | ICD-10-CM | POA: Diagnosis present

## 2018-10-21 LAB — BPAM RBC
Blood Product Expiration Date: 202004152359
ISSUE DATE / TIME: 202004040957
Unit Type and Rh: 6200

## 2018-10-21 LAB — MULTIPLE MYELOMA PANEL, SERUM
Albumin SerPl Elph-Mcnc: 3.3 g/dL (ref 2.9–4.4)
Albumin/Glob SerPl: 1.1 (ref 0.7–1.7)
Alpha 1: 0.2 g/dL (ref 0.0–0.4)
Alpha2 Glob SerPl Elph-Mcnc: 0.8 g/dL (ref 0.4–1.0)
B-Globulin SerPl Elph-Mcnc: 1.3 g/dL (ref 0.7–1.3)
Gamma Glob SerPl Elph-Mcnc: 0.8 g/dL (ref 0.4–1.8)
Globulin, Total: 3.1 g/dL (ref 2.2–3.9)
IgA: 350 mg/dL (ref 87–352)
IgG (Immunoglobin G), Serum: 942 mg/dL (ref 586–1602)
IgM (Immunoglobulin M), Srm: 67 mg/dL (ref 26–217)
Total Protein ELP: 6.4 g/dL (ref 6.0–8.5)

## 2018-10-21 LAB — TYPE AND SCREEN
ABO/RH(D): A POS
Antibody Screen: NEGATIVE
Unit division: 0

## 2018-10-21 MED ORDER — SODIUM CHLORIDE (PF) 0.9 % IJ SOLN
INTRAMUSCULAR | Status: AC
  Filled 2018-10-21: qty 50

## 2018-10-21 MED ORDER — IOHEXOL 300 MG/ML  SOLN
100.0000 mL | Freq: Once | INTRAMUSCULAR | Status: AC | PRN
  Administered 2018-10-21: 100 mL via INTRAVENOUS

## 2018-10-22 ENCOUNTER — Telehealth: Payer: Self-pay | Admitting: Hematology and Oncology

## 2018-10-22 ENCOUNTER — Other Ambulatory Visit: Payer: Self-pay | Admitting: *Deleted

## 2018-10-22 DIAGNOSIS — D649 Anemia, unspecified: Secondary | ICD-10-CM

## 2018-10-22 NOTE — Telephone Encounter (Signed)
I informed the patient that the CT scans do not show any evidence of metastatic disease or abdominal tumor.  There were 2 small lung nodules which we will evaluate in a year. I recommended 2 doses of IV iron as well. I also sent a message to Dr. [name] to see her for GI evaluation.

## 2018-10-23 ENCOUNTER — Other Ambulatory Visit: Payer: Self-pay | Admitting: *Deleted

## 2018-10-23 ENCOUNTER — Ambulatory Visit: Payer: No Typology Code available for payment source

## 2018-10-23 DIAGNOSIS — D649 Anemia, unspecified: Secondary | ICD-10-CM

## 2018-10-23 DIAGNOSIS — C50411 Malignant neoplasm of upper-outer quadrant of right female breast: Secondary | ICD-10-CM | POA: Diagnosis not present

## 2018-10-23 LAB — OCCULT BLOOD X 1 CARD TO LAB, STOOL
Fecal Occult Bld: NEGATIVE
Fecal Occult Bld: NEGATIVE
Fecal Occult Bld: NEGATIVE

## 2018-10-23 NOTE — Progress Notes (Signed)
hen

## 2018-10-24 ENCOUNTER — Telehealth: Payer: Self-pay | Admitting: Hematology and Oncology

## 2018-10-24 NOTE — Telephone Encounter (Signed)
Spoke with Almyra Free re 4/10 and 4/17 appointments.

## 2018-10-25 ENCOUNTER — Other Ambulatory Visit: Payer: Self-pay

## 2018-10-25 ENCOUNTER — Other Ambulatory Visit: Payer: Self-pay | Admitting: *Deleted

## 2018-10-25 ENCOUNTER — Inpatient Hospital Stay: Payer: No Typology Code available for payment source

## 2018-10-25 VITALS — BP 117/76 | HR 103 | Temp 98.7°F | Resp 18

## 2018-10-25 DIAGNOSIS — D509 Iron deficiency anemia, unspecified: Secondary | ICD-10-CM

## 2018-10-25 DIAGNOSIS — E663 Overweight: Secondary | ICD-10-CM

## 2018-10-25 DIAGNOSIS — C50411 Malignant neoplasm of upper-outer quadrant of right female breast: Secondary | ICD-10-CM

## 2018-10-25 DIAGNOSIS — R5383 Other fatigue: Secondary | ICD-10-CM

## 2018-10-25 DIAGNOSIS — E118 Type 2 diabetes mellitus with unspecified complications: Secondary | ICD-10-CM

## 2018-10-25 DIAGNOSIS — D649 Anemia, unspecified: Secondary | ICD-10-CM

## 2018-10-25 DIAGNOSIS — Z0000 Encounter for general adult medical examination without abnormal findings: Secondary | ICD-10-CM

## 2018-10-25 DIAGNOSIS — Z171 Estrogen receptor negative status [ER-]: Secondary | ICD-10-CM

## 2018-10-25 DIAGNOSIS — Z170 Estrogen receptor positive status [ER+]: Secondary | ICD-10-CM

## 2018-10-25 HISTORY — DX: Iron deficiency anemia, unspecified: D50.9

## 2018-10-25 LAB — CBC WITH DIFFERENTIAL (CANCER CENTER ONLY)
Abs Immature Granulocytes: 0.02 10*3/uL (ref 0.00–0.07)
Basophils Absolute: 0 10*3/uL (ref 0.0–0.1)
Basophils Relative: 1 %
Eosinophils Absolute: 0.2 10*3/uL (ref 0.0–0.5)
Eosinophils Relative: 3 %
HCT: 29.8 % — ABNORMAL LOW (ref 36.0–46.0)
Hemoglobin: 9 g/dL — ABNORMAL LOW (ref 12.0–15.0)
Immature Granulocytes: 0 %
Lymphocytes Relative: 30 %
Lymphs Abs: 1.8 10*3/uL (ref 0.7–4.0)
MCH: 27.8 pg (ref 26.0–34.0)
MCHC: 30.2 g/dL (ref 30.0–36.0)
MCV: 92 fL (ref 80.0–100.0)
Monocytes Absolute: 0.4 10*3/uL (ref 0.1–1.0)
Monocytes Relative: 7 %
Neutro Abs: 3.7 10*3/uL (ref 1.7–7.7)
Neutrophils Relative %: 59 %
Platelet Count: 448 10*3/uL — ABNORMAL HIGH (ref 150–400)
RBC: 3.24 MIL/uL — ABNORMAL LOW (ref 3.87–5.11)
RDW: 15.8 % — ABNORMAL HIGH (ref 11.5–15.5)
WBC Count: 6.2 10*3/uL (ref 4.0–10.5)
nRBC: 0 % (ref 0.0–0.2)

## 2018-10-25 LAB — SAMPLE TO BLOOD BANK

## 2018-10-25 MED ORDER — SODIUM CHLORIDE 0.9 % IV SOLN
Freq: Once | INTRAVENOUS | Status: AC
  Administered 2018-10-25: 10:00:00 via INTRAVENOUS
  Filled 2018-10-25: qty 250

## 2018-10-25 MED ORDER — SODIUM CHLORIDE 0.9 % IV SOLN: 510.0000 mg | Freq: Once | INTRAVENOUS | Status: DC

## 2018-10-25 MED ORDER — SODIUM CHLORIDE 0.9 % IV SOLN
510.0000 mg | Freq: Once | INTRAVENOUS | Status: AC
  Administered 2018-10-25: 510 mg via INTRAVENOUS
  Filled 2018-10-25: qty 17

## 2018-10-25 NOTE — Addendum Note (Signed)
Addended by: Ishmael Holter on: 10/25/2018 12:05 PM   Modules accepted: Orders

## 2018-10-25 NOTE — Addendum Note (Signed)
Addended by: Ishmael Holter on: 10/25/2018 10:19 AM   Modules accepted: Orders

## 2018-10-25 NOTE — Addendum Note (Signed)
Addended by: Delane Ginger on: 10/25/2018 08:50 AM   Modules accepted: Orders

## 2018-10-25 NOTE — Patient Instructions (Signed)

## 2018-10-26 LAB — ACTH: C206 ACTH: 14 pg/mL (ref 7.2–63.3)

## 2018-10-29 ENCOUNTER — Other Ambulatory Visit: Payer: Self-pay | Admitting: Oncology

## 2018-10-30 ENCOUNTER — Telehealth: Payer: Self-pay | Admitting: *Deleted

## 2018-10-30 ENCOUNTER — Encounter: Payer: Self-pay | Admitting: General Surgery

## 2018-10-30 NOTE — Telephone Encounter (Signed)
Medical records faxed to [facility]; [place] [phone]

## 2018-10-31 ENCOUNTER — Encounter: Payer: Self-pay | Admitting: Internal Medicine

## 2018-10-31 ENCOUNTER — Other Ambulatory Visit (INDEPENDENT_AMBULATORY_CARE_PROVIDER_SITE_OTHER): Payer: No Typology Code available for payment source

## 2018-10-31 ENCOUNTER — Ambulatory Visit (INDEPENDENT_AMBULATORY_CARE_PROVIDER_SITE_OTHER): Payer: No Typology Code available for payment source | Admitting: Internal Medicine

## 2018-10-31 ENCOUNTER — Other Ambulatory Visit: Payer: Self-pay

## 2018-10-31 DIAGNOSIS — R7989 Other specified abnormal findings of blood chemistry: Secondary | ICD-10-CM

## 2018-10-31 DIAGNOSIS — D508 Other iron deficiency anemias: Secondary | ICD-10-CM

## 2018-10-31 DIAGNOSIS — K602 Anal fissure, unspecified: Secondary | ICD-10-CM | POA: Diagnosis not present

## 2018-10-31 DIAGNOSIS — R945 Abnormal results of liver function studies: Secondary | ICD-10-CM

## 2018-10-31 HISTORY — DX: Abnormal results of liver function studies: R94.5

## 2018-10-31 HISTORY — DX: Other specified abnormal findings of blood chemistry: R79.89

## 2018-10-31 LAB — HEPATIC FUNCTION PANEL
ALT: 25 U/L (ref 0–35)
AST: 18 U/L (ref 0–37)
Albumin: 3.7 g/dL (ref 3.5–5.2)
Alkaline Phosphatase: 92 U/L (ref 39–117)
Bilirubin, Direct: 0 mg/dL (ref 0.0–0.3)
Total Bilirubin: 0.4 mg/dL (ref 0.2–1.2)
Total Protein: 7 g/dL (ref 6.0–8.3)

## 2018-10-31 LAB — IGA: IgA: 354 mg/dL (ref 68–378)

## 2018-10-31 NOTE — Assessment & Plan Note (Addendum)
Cause is not clear.  There does not seem to be any hemorrhage that there was a major drop in her hemoglobin from 14-7.  CT scanning excluded any type of hemorrhage there, so this really is a mystery.  Will need colonoscopy but check celiac Abs with tissue transglutaminase antibody and an IgA level.  Dr. Payton Mccallum is giving her Shirlean Kelly and will follow up on her ferritin and hemoglobin.  Given the coronavirus pandemic restrictions and what we know, I do not categorize her colonoscopy as urgent so we will put her on the list and work her in when restrictions are lifted or sooner should clinical criteria suggest otherwise.  If that is negative I would probably hold there and not do a capsule endoscopy but we did discuss that as a possible other diagnostic procedure.

## 2018-10-31 NOTE — Assessment & Plan Note (Signed)
Rare bleeding

## 2018-10-31 NOTE — Progress Notes (Signed)
Nichole Wilson 58 y.o. [birth year] [id number]  Assessment & Plan:  Iron deficiency anemia Cause is not clear.  There does not seem to be any hemorrhage that there was a major drop in her hemoglobin from 14-7.  CT scanning excluded any type of hemorrhage there, so this really is a mystery.  Will need colonoscopy but check celiac Abs with tissue transglutaminase antibody and an IgA level.  Dr. Payton Mccallum is giving her Shirlean Kelly and will follow up on her ferritin and hemoglobin.  Given the coronavirus pandemic restrictions and what we know, I do not categorize her colonoscopy as urgent so we will put her on the list and work her in when restrictions are lifted or sooner should clinical criteria suggest otherwise.  If that is negative I would probably hold there and not do a capsule endoscopy but we did discuss that as a possible other diagnostic procedure.  Abnormal LFTs rechceck LFTs.  Celiac disease could do this and iron deficiency anemia though seems unlikely I am checking for that as well.  Most likely has some sort of fatty liver issue.   Anal fissure - posterior Rare bleeding    I appreciate the opportunity to care for this patient. CC: Nichole Wilson, Nichole Spillers, PA-C Dr. Lindi Adie  Subjective:   Chief Complaint: Iron deficiency anemia  HPI Nichole Wilson is here at the request of Dr. Lindi Adie, we were going to do a telehealth visit but her phone broke so we did it in person.  She has a new diagnosis of iron deficiency anemia that was a surprise to all of [place].  She had had a normal hemoglobin 2 months ago at 14 with a normal MCV and then had an acute drop in her hemoglobin to 7.4 associated with dyspnea on exertion.  Hemoccult studies negative x3.  Ferritin was 119 3 years ago and is now less than 4.  She does eat meat and iron rich foods, she has not noted any bleeding other than rare slight rectal bleeding with known posterior anal fissure that had been diagnosed in 2017.[name]  No melena or  hematochezia.  I had done an EGD for slight hematemesis in February and that showed a small erosion at her GE junction, small hiatal hernia, mild erosive gastritis, with otherwise negative postoperative exam, she had a proximal gist resected in 2017.  She also has a history of breast cancer and had a CT of the chest abdomen and pelvis in early April which showed her small hiatal hernia, and 2 new small adjacent groundglass density pulmonary nodules in the left lower lobe, some DJD of the spine, and diverticulosis.  Her last colonoscopy was in 2015 with diverticulosis otherwise negative.  Dr. Olevia Perches did that.  She has received a transfusion of 1 packed unit and 1 Feraheme her hemoglobin was 9 6 days ago.  Her MCV is remain normal her B12 is normal.  Erythropoietin appropriately elevated.  Multi-myeloma panel negative.  Has not had a menstrual period in 10 years.  Has not been donating blood.  Another issue which I had communicated with her primary care provider about with some mild abnormal liver chemistries.  There is a history of fatty liver on an ultrasound in the past.  She does have hyperlipidemia.  She had a ANA positive in the past negative in January, cortisol level okay she had had some changes in the shape of her femur syndrome.  ACTH normal as well.  Hepatitis panel normal.  Acute.  Alkaline phosphatase fractions normal.  In January alk phos was 165 and ALT 54 with a normal AST 4 months ago that was normal 5 months ago they were 119 and 40 which were both mildly elevated.  No mention of fatty liver on her CT scan. Allergies  Allergen Reactions   Lisinopril Swelling   Other     General anesthesia-severe vomiting   Current Meds  Medication Sig   aspirin 81 MG tablet Take 325 mg by mouth daily.    Cholecalciferol (VITAMIN D3) 10000 units capsule Take 10,000 Units by mouth daily.   hydrochlorothiazide (MICROZIDE) 12.5 MG capsule Take 1 capsule (12.5 mg total) by mouth daily.   Icosapent  Ethyl (VASCEPA) 1 g CAPS Take 2 capsules (2 g total) by mouth 2 (two) times daily.   Multiple Vitamins-Minerals (MULTIVITAMIN ADULT PO) Take by mouth.   pantoprazole (PROTONIX) 40 MG tablet Take 1 tablet (40 mg total) by mouth daily before breakfast.   pyridoxine (B-6) 250 MG tablet Take 250 mg by mouth daily.   RESTASIS 0.05 % ophthalmic emulsion    vitamin B-12 (CYANOCOBALAMIN) 1000 MCG tablet Take 1,000 mcg by mouth daily.   Past Medical History:  Diagnosis Date   Breast cyst, left 01-04-12   cyst no longer apparent   Cancer [facility])    right breast cancer   Cholelithiasis with choledocholithiasis    Chronic calculus cholecystitis 12/25/2011   Lap chole on 03/23/34    Complication of anesthesia 2002   PT STATES DURING HER C-SECTION SHE WAS AWARE OF INCISION AND PAIN -THEN WAS QUICKLY "PUT OUT"   Family history of adverse reaction to anesthesia    father became aggressive   Gallstone pancreatitis    Glucose intolerance (impaired glucose tolerance)    since at least 2011.    Hepatic steatosis    Hypercholesterolemia with hypertriglyceridemia    Hypertension 01-04-12   med d/c-last taken 12-07-11  -PT STATES HER MEDICAL DOCTOR STOPPED HER B/P MEDICATION BECAUSE HER B/P TOO LOW AND PT STATES HER B/P'S HAVE BEEN OK SINCE.   Hypertriglyceridemia    Migraine without aura, without mention of intractable migraine without mention of status migrainosus    PONV (postoperative nausea and vomiting)    patient states that she did not get sick with mastectomy   Pre-diabetes    Past Surgical History:  Procedure Laterality Date   CESAREAN SECTION     2002   CHOLECYSTECTOMY  03/25/2012   Procedure: LAPAROSCOPIC CHOLECYSTECTOMY WITH INTRAOPERATIVE CHOLANGIOGRAM;  Surgeon: Haywood Lasso, MD;  Location: [facility];  Service: General;  Laterality: N/A;  Laparoscopic Cholecystectomy with Intraoperative Cholangiogram   COLONOSCOPY     ERCP  12/05/2011   Procedure: ENDOSCOPIC  RETROGRADE CHOLANGIOPANCREATOGRAPHY (ERCP);  Surgeon: Inda Castle, MD;  Location: [facility];  Service: Endoscopy;  Laterality: N/A;   ERCP  12/08/2011   Procedure: ENDOSCOPIC RETROGRADE CHOLANGIOPANCREATOGRAPHY (ERCP);  Surgeon: Inda Castle, MD;  Location: [facility];  Service: Gastroenterology;  Laterality: N/A;   ESOPHAGOGASTRODUODENOSCOPY (EGD) WITH PROPOFOL N/A 09/28/2015   Procedure: ESOPHAGOGASTRODUODENOSCOPY (EGD) WITH PROPOFOL;  Surgeon: Clarene Essex, MD;  Location: [facility];  Service: Endoscopy;  Laterality: N/A;  H AND P IN DRAWER   EUS N/A 10/13/2015   Procedure: UPPER ENDOSCOPIC ULTRASOUND (EUS) RADIAL;  Surgeon: Arta Silence, MD;  Location: [facility];  Service: Endoscopy;  Laterality: N/A;   LAPAROSCOPIC PARTIAL GASTRECTOMY N/A 01/11/2016   Procedure: LAPAROSCOPIC PARTIAL GASTRECTOMY;  Surgeon: [name], MD;  Location: [facility];  Service: General;  Laterality: N/A;  MASTECTOMY W/ SENTINEL NODE BIOPSY Right 09/15/2015   Procedure: RIGHT MASTECTOMY WITH SENTINEL LYMPH NODE BIOPSY;  Surgeon: Rolm Bookbinder, MD;  Location: [facility];  Service: General;  Laterality: Right;   UPPER GI ENDOSCOPY N/A 01/11/2016   Procedure: UPPER GI ENDOSCOPY;  Surgeon: [name], MD;  Location: [facility];  Service: General;  Laterality: N/A;   Social History   Social History Narrative   Married, husband is a Dealer   2 young adult daughters   - 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   Review of Systems As per HPI  Objective:   Physical Exam Ht 5' 2"  (1.575 m)    Wt 149 lb (67.6 kg)    LMP 11/29/2011    BMI 27.25 kg/m  No acute distress

## 2018-10-31 NOTE — Assessment & Plan Note (Addendum)
rechceck LFTs.  Celiac disease could do this and iron deficiency anemia though seems unlikely I am checking for that as well.  Most likely has some sort of fatty liver issue.

## 2018-10-31 NOTE — Patient Instructions (Signed)
Good to see you.  Plan for labs as we discussed.  Will anticipate calling you about a colonoscopy later.  Will let you know lab results when available.  I appreciate the opportunity to care for you. Gatha Mayer, MD, [facility]

## 2018-11-01 ENCOUNTER — Inpatient Hospital Stay: Payer: No Typology Code available for payment source

## 2018-11-01 ENCOUNTER — Telehealth: Payer: Self-pay | Admitting: *Deleted

## 2018-11-01 ENCOUNTER — Other Ambulatory Visit: Payer: Self-pay

## 2018-11-01 VITALS — BP 122/88 | HR 87 | Temp 98.7°F | Resp 18 | Wt 153.0 lb

## 2018-11-01 DIAGNOSIS — Z171 Estrogen receptor negative status [ER-]: Secondary | ICD-10-CM

## 2018-11-01 DIAGNOSIS — D509 Iron deficiency anemia, unspecified: Secondary | ICD-10-CM

## 2018-11-01 DIAGNOSIS — C50411 Malignant neoplasm of upper-outer quadrant of right female breast: Secondary | ICD-10-CM

## 2018-11-01 MED ORDER — SODIUM CHLORIDE 0.9 % IV SOLN
510.0000 mg | Freq: Once | INTRAVENOUS | Status: AC
  Administered 2018-11-01: 510 mg via INTRAVENOUS
  Filled 2018-11-01: qty 17

## 2018-11-01 MED ORDER — SODIUM CHLORIDE 0.9 % IV SOLN
Freq: Once | INTRAVENOUS | Status: AC
  Administered 2018-11-01: 10:00:00 via INTRAVENOUS
  Filled 2018-11-01: qty 250

## 2018-11-01 NOTE — Telephone Encounter (Signed)
Medical records faxed to [facility] - Rachael Darby RN; RI [phone]

## 2018-11-01 NOTE — Patient Instructions (Signed)

## 2018-11-04 LAB — TISSUE TRANSGLUTAMINASE, IGA: (tTG) Ab, IgA: 1 U/mL

## 2018-11-04 NOTE — Progress Notes (Signed)
Nichole Wilson,  Liver tests back to normal!  You do not have celiac disease(gluten allergy). More good news.  As we had discussed - will set up a colonoscopy when that makes sense.  CEG

## 2018-11-06 ENCOUNTER — Ambulatory Visit (INDEPENDENT_AMBULATORY_CARE_PROVIDER_SITE_OTHER): Payer: No Typology Code available for payment source | Admitting: Nurse Practitioner

## 2018-11-06 ENCOUNTER — Encounter: Payer: Self-pay | Admitting: Nurse Practitioner

## 2018-11-06 ENCOUNTER — Other Ambulatory Visit: Payer: Self-pay

## 2018-11-06 VITALS — BP 124/80 | HR 99 | Temp 98.1°F | Ht 60.8 in | Wt 152.6 lb

## 2018-11-06 DIAGNOSIS — R5383 Other fatigue: Secondary | ICD-10-CM

## 2018-11-06 DIAGNOSIS — D508 Other iron deficiency anemias: Secondary | ICD-10-CM

## 2018-11-06 DIAGNOSIS — E781 Pure hyperglyceridemia: Secondary | ICD-10-CM | POA: Diagnosis not present

## 2018-11-06 NOTE — Progress Notes (Signed)
Subjective:     Patient ID: Nichole Wilson , female    DOB: 05-09-1961 , 58 y.o.   MRN: [id number]   Chief Complaint  Patient presents with  . Edema  . Fatigue    HPI   Here for follow up - anemia - she has had 2 iron transfusions and 1 blood transfusion.  She is supposed to be contacted by Dr. [name] for the colonoscopy.      Past Medical History:  Diagnosis Date  . Breast cyst, left 01-04-12   cyst no longer apparent  . Cancer [facility])    right breast cancer  . Cholelithiasis with choledocholithiasis   . Chronic calculus cholecystitis 12/25/2011   Lap chole on 03/25/12   . Complication of anesthesia 2002   PT STATES DURING HER C-SECTION SHE WAS AWARE OF INCISION AND PAIN -THEN WAS QUICKLY "PUT OUT"  . Family history of adverse reaction to anesthesia    father became aggressive  . Gallstone pancreatitis   . Glucose intolerance (impaired glucose tolerance)    since at least 2011.   [name] Hepatic steatosis   . Hypercholesterolemia with hypertriglyceridemia   . Hypertension 01-04-12   med d/c-last taken 12-07-11  -PT [facility] STOPPED HER B/P MEDICATION BECAUSE HER B/P TOO LOW AND PT STATES HER B/P'S HAVE BEEN OK SINCE.  [name] Hypertriglyceridemia   . Migraine without aura, without mention of intractable migraine without mention of status migrainosus   . PONV (postoperative nausea and vomiting)    patient states that she did not get sick with mastectomy  . Pre-diabetes      Family History  Problem Relation Age of Onset  . Diabetes Mother   . Diabetes Father   . Diabetes Brother   . Heart disease Paternal Uncle   . Liver disease Brother   . Colon cancer Neg Hx      Current Outpatient Medications:  .  Cholecalciferol (VITAMIN D3) 10000 units capsule, Take 10,000 Units by mouth daily., Disp: , Rfl:  .  ECHINACEA EXTRACT PO, Take 1 capsule by mouth 2 (two) times daily. Take 2 tabs bid, Disp: , Rfl:  .  Icosapent Ethyl (VASCEPA) 1 g CAPS, Take 2 capsules (2 g total)  by mouth 2 (two) times daily., Disp: 360 capsule, Rfl: 1 .  Multiple Vitamins-Minerals (MULTIVITAMIN ADULT PO), Take by mouth., Disp: , Rfl:  .  pantoprazole (PROTONIX) 40 MG tablet, Take 1 tablet (40 mg total) by mouth daily before breakfast., Disp: 90 tablet, Rfl: 3 .  RESTASIS 0.05 % ophthalmic emulsion, , Disp: , Rfl: 2 .  vitamin B-12 (CYANOCOBALAMIN) 1000 MCG tablet, Take 1,000 mcg by mouth daily., Disp: , Rfl:    Allergies  Allergen Reactions  . Lisinopril Swelling  . Other     General anesthesia-severe vomiting     Review of Systems  Constitutional: Positive for fatigue. Negative for fever.  Respiratory: Negative.   Cardiovascular: Negative.  Negative for chest pain, palpitations and leg swelling.  Gastrointestinal: Negative.   Neurological: Negative for dizziness and headaches.     Today's Vitals   11/06/18 0949  BP: 124/80  Pulse: 99  Temp: 98.1 F (36.7 C)  TempSrc: Oral  Weight: 152 lb 9.6 oz (69.2 kg)  Height: 5' 0.8" (1.544 m)  PainSc: 0-No pain   Body mass index is 29.02 kg/m.   Objective:  Physical Exam Constitutional:      Appearance: Normal appearance.  Cardiovascular:     Rate and Rhythm:  Normal rate and regular rhythm.     Pulses: Normal pulses.     Heart sounds: Normal heart sounds. No murmur.  Pulmonary:     Effort: Pulmonary effort is normal.     Breath sounds: Normal breath sounds.  Skin:    General: Skin is warm and dry.     Capillary Refill: Capillary refill takes less than 2 seconds.     Coloration: Skin is not pale.  Neurological:     General: No focal deficit present.     Mental Status: She is alert and oriented to person, place, and time.  Psychiatric:        Mood and Affect: Mood normal.        Thought Content: Thought content normal.        Judgment: Judgment normal.         Assessment And Plan:     1. Fatigue, unspecified type  This is chronic for her, I will send her for a sleep study and check iron and thryoid  studies.   She has recently had 2 iron transfusions - Ambulatory referral to Sleep Studies - CBC - Iron, TIBC and Ferritin Panel - CMP14 + Anion Gap - TSH - T4 - T3, free - Thyroid antibodies  2. Hypertriglyceridemia  Chronic  Says has been long term, will recheck lipids today  She declines eating high fat foods, breads and sweets  Continue with Vascepa  - Lipid panel  3. Other iron deficiency anemia  Had 2 iron transfusions in the last month  Will recheck levels today    [name], FNP    THE PATIENT IS ENCOURAGED TO PRACTICE SOCIAL DISTANCING DUE TO THE COVID-19 PANDEMIC.

## 2018-11-07 ENCOUNTER — Telehealth: Payer: Self-pay

## 2018-11-07 LAB — T3, FREE: T3, Free: 3.1 pg/mL (ref 2.0–4.4)

## 2018-11-07 LAB — CBC
Hematocrit: 34.5 % (ref 34.0–46.6)
Hemoglobin: 11 g/dL — ABNORMAL LOW (ref 11.1–15.9)
MCH: 29.2 pg (ref 26.6–33.0)
MCHC: 31.9 g/dL (ref 31.5–35.7)
MCV: 92 fL (ref 79–97)
Platelets: 405 10*3/uL (ref 150–450)
RBC: 3.77 x10E6/uL (ref 3.77–5.28)
RDW: 19.3 % — ABNORMAL HIGH (ref 11.7–15.4)
WBC: 6.1 10*3/uL (ref 3.4–10.8)

## 2018-11-07 LAB — CMP14 + ANION GAP
ALT: 25 IU/L (ref 0–32)
AST: 21 IU/L (ref 0–40)
Albumin/Globulin Ratio: 1.6 (ref 1.2–2.2)
Albumin: 4.1 g/dL (ref 3.8–4.9)
Alkaline Phosphatase: 100 IU/L (ref 39–117)
Anion Gap: 19 mmol/L — ABNORMAL HIGH (ref 10.0–18.0)
BUN/Creatinine Ratio: 19 (ref 9–23)
BUN: 10 mg/dL (ref 6–24)
Bilirubin Total: <0.2 mg/dL (ref 0.0–1.2)
CO2: 21 mmol/L (ref 20–29)
Calcium: 9.4 mg/dL (ref 8.7–10.2)
Chloride: 102 mmol/L (ref 96–106)
Creatinine, Ser: 0.53 mg/dL — ABNORMAL LOW (ref 0.57–1.00)
GFR calc Af Amer: 124 mL/min/{1.73_m2} (ref >59)
GFR calc non Af Amer: 107 mL/min/{1.73_m2} (ref >59)
Globulin, Total: 2.6 g/dL (ref 1.5–4.5)
Glucose: 180 mg/dL — ABNORMAL HIGH (ref 65–99)
Potassium: 4.3 mmol/L (ref 3.5–5.2)
Sodium: 142 mmol/L (ref 134–144)
Total Protein: 6.7 g/dL (ref 6.0–8.5)

## 2018-11-07 LAB — IRON,TIBC AND FERRITIN PANEL
Ferritin: 879 ng/mL — ABNORMAL HIGH (ref 15–150)
Iron Saturation: 27 % (ref 15–55)
Iron: 80 ug/dL (ref 27–159)
Total Iron Binding Capacity: 301 ug/dL (ref 250–450)
UIBC: 221 ug/dL (ref 131–425)

## 2018-11-07 LAB — THYROID ANTIBODIES
Thyroglobulin Antibody: <1 IU/mL (ref 0.0–0.9)
Thyroperoxidase Ab SerPl-aCnc: 9 IU/mL (ref 0–34)

## 2018-11-07 LAB — LIPID PANEL
Chol/HDL Ratio: 6.4 ratio — ABNORMAL HIGH (ref 0.0–4.4)
Cholesterol, Total: 224 mg/dL — ABNORMAL HIGH (ref 100–199)
HDL: 35 mg/dL — ABNORMAL LOW (ref >39)
Triglycerides: 766 mg/dL (ref 0–149)

## 2018-11-07 LAB — TSH: TSH: 1.37 u[IU]/mL (ref 0.450–4.500)

## 2018-11-07 LAB — T4: T4, Total: 8.2 ug/dL (ref 4.5–12.0)

## 2018-11-07 NOTE — Telephone Encounter (Signed)
Left message to call back for lab results.

## 2018-11-07 NOTE — Telephone Encounter (Signed)
Your Hgb is better at 11 was 9, the iron transfusion was effective.  Iron levels are within normal levels as well. Kidney functions are normal.  Your triglycerides are extremely elevated more than before at 766 was 581, have you ever seen a specialist for this because you may benefit from an injectable medication?  Thyroid levels are normal.  Patient notified she stated she has not been to a specialist before. YRL,RMA

## 2018-11-07 NOTE — Telephone Encounter (Signed)
-----   Message from [name], [place] sent at 11/07/2018  2:14 PM EDT ----- Your Hgb is better at 11 was 9, the iron transfusion was effective.  Iron levels are within normal levels as well. Kidney functions are normal.  Your triglycerides are extremely elevated more than before at 766 was 581, have you ever seen a specialist for this because you may benefit from an injectable medication?  Thyroid levels are normal.

## 2018-11-10 ENCOUNTER — Other Ambulatory Visit: Payer: Self-pay | Admitting: Nurse Practitioner

## 2018-11-10 DIAGNOSIS — E781 Pure hyperglyceridemia: Secondary | ICD-10-CM

## 2018-11-13 ENCOUNTER — Ambulatory Visit (AMBULATORY_SURGERY_CENTER): Payer: Self-pay

## 2018-11-13 ENCOUNTER — Other Ambulatory Visit: Payer: Self-pay

## 2018-11-13 VITALS — Ht 62.0 in | Wt 152.0 lb

## 2018-11-13 DIAGNOSIS — D508 Other iron deficiency anemias: Secondary | ICD-10-CM

## 2018-11-13 NOTE — Progress Notes (Signed)
Denies allergies to eggs or soy products. Denies complication of anesthesia or sedation. Denies use of weight loss medication. Denies use of O2.   Emmi instructions declined.   Pre-Visit was conducted by phone due to Covid 19. Instructions were reviewed and mailed to patients confirmed home address. Patient was encouraged to call with any questions or concerns regarding instructions.

## 2018-11-14 ENCOUNTER — Encounter: Payer: Self-pay | Admitting: Nurse Practitioner

## 2018-11-18 MED FILL — RESTASIS 0.05% EYE EMULSION: 0.05 | 90 days supply | Qty: 180 | Fill #2

## 2018-11-29 ENCOUNTER — Other Ambulatory Visit: Payer: Self-pay | Admitting: Internal Medicine

## 2018-11-29 DIAGNOSIS — E782 Mixed hyperlipidemia: Secondary | ICD-10-CM

## 2018-11-29 DIAGNOSIS — E781 Pure hyperglyceridemia: Secondary | ICD-10-CM

## 2018-12-02 ENCOUNTER — Encounter: Payer: No Typology Code available for payment source | Admitting: Internal Medicine

## 2018-12-02 ENCOUNTER — Telehealth: Payer: Self-pay | Admitting: Neurology

## 2018-12-02 NOTE — Telephone Encounter (Addendum)
I called pt. Pt's meds, allergies, and PMH were updated.  Pt reports that she has never had a sleep study but does endorse snoring  Pt reports that her weight is 150 lbs and she is 5'3.  Pt reports that her neck size is 16''.  Epworth Sleepiness Scale 0= would never doze 1= slight chance of dozing 2= moderate chance of dozing 3= high chance of dozing  Sitting and reading: 1 Watching TV: 1 Sitting inactive in a public place (ex. Theater or meeting): 0 As a passenger in a car for an hour without a break: 1 Lying down to rest in the afternoon: 1 Sitting and talking to someone: 0 Sitting quietly after lunch (no alcohol): 1 In a car, while stopped in traffic: 0 Total: 5  FSS: 33

## 2018-12-02 NOTE — Telephone Encounter (Signed)
Due to current COVID 19 pandemic, our office is severely reducing in office visits, in order to minimize the risk to our patients and healthcare providers.    Pt understands that although there may be some limitations with this type of visit, we will take all precautions to reduce any security or privacy concerns.  Pt understands that this will be treated like an in office visit and we will file with pt's insurance, and there may be a patient responsible charge related to this service.  Pt's email is [email or web address]. Pt will be using [employer] for their virtual visit. Pt understands that the nurse will be calling to go over pt's chart.

## 2018-12-03 ENCOUNTER — Telehealth: Payer: Self-pay | Admitting: *Deleted

## 2018-12-03 ENCOUNTER — Ambulatory Visit: Payer: No Typology Code available for payment source | Admitting: Neurology

## 2018-12-03 ENCOUNTER — Other Ambulatory Visit: Payer: Self-pay

## 2018-12-03 NOTE — Telephone Encounter (Signed)
Covid-19 travel screening questions  Have you traveled in the last 14 days? no If yes where?  Do you now or have you had a fever in the last 14 days? no  Do you have any respiratory symptoms of shortness of breath or cough now or in the last 14 days? no  Do you have any family members or close contacts with diagnosed or suspected Covid-19? No  Pt is aware that care partner will wait in the car during procedure.  She will wear a mask.  She is requesting Suprep, so sample left at front desk and new instructions sent via [place].

## 2018-12-03 NOTE — Progress Notes (Signed)
Patient did not join [employer] virtual visit today. Was not able to answer phone. We will reach out to ask patient to reschedule.

## 2018-12-04 DIAGNOSIS — D124 Benign neoplasm of descending colon: Secondary | ICD-10-CM | POA: Diagnosis not present

## 2018-12-05 ENCOUNTER — Encounter: Payer: Self-pay | Admitting: Internal Medicine

## 2018-12-05 ENCOUNTER — Other Ambulatory Visit: Payer: Self-pay

## 2018-12-05 ENCOUNTER — Ambulatory Visit (AMBULATORY_SURGERY_CENTER): Payer: No Typology Code available for payment source | Admitting: Internal Medicine

## 2018-12-05 VITALS — BP 155/88 | HR 85 | Temp 98.5°F | Resp 15 | Ht 62.0 in | Wt 152.0 lb

## 2018-12-05 DIAGNOSIS — D124 Benign neoplasm of descending colon: Secondary | ICD-10-CM | POA: Diagnosis not present

## 2018-12-05 DIAGNOSIS — K5730 Diverticulosis of large intestine without perforation or abscess without bleeding: Secondary | ICD-10-CM

## 2018-12-05 DIAGNOSIS — D508 Other iron deficiency anemias: Secondary | ICD-10-CM

## 2018-12-05 MED ORDER — SODIUM CHLORIDE 0.9 % IV SOLN: 500.0000 mL | Freq: Once | INTRAVENOUS | Status: DC

## 2018-12-05 NOTE — Progress Notes (Signed)
To PACU, vss. Report to [email or web address] 

## 2018-12-05 NOTE — Progress Notes (Signed)
Covid screening and temp done by TW. Vital signs by JB.  No change in medical history since previsit

## 2018-12-05 NOTE — Op Note (Signed)
[facility] Patient Name: Nichole Wilson Procedure Date: 12/05/2018 8:34 AM MRN: [id number] Endoscopist: Gatha Mayer , MD Age: 58 Referring MD:  Date of Birth: 03/29/1961 Gender: Female Account #: [id number] Procedure:                Colonoscopy Indications:              Iron deficiency anemia Medicines:                Propofol per Anesthesia, Monitored Anesthesia Care Procedure:                Pre-Anesthesia Assessment:                           - Prior to the procedure, a History and Physical                            was performed, and patient medications and                            allergies were reviewed. The patient's tolerance of                            previous anesthesia was also reviewed. The risks                            and benefits of the procedure and the sedation                            options and risks were discussed with the patient.                            All questions were answered, and informed consent                            was obtained. Prior Anticoagulants: The patient has                            taken no previous anticoagulant or antiplatelet                            agents. ASA Grade Assessment: II - A patient with                            mild systemic disease. After reviewing the risks                            and benefits, the patient was deemed in                            satisfactory condition to undergo the procedure.                           After obtaining informed consent, the colonoscope  was passed under direct vision. Throughout the                            procedure, the patient's blood pressure, pulse, and                            oxygen saturations were monitored continuously. The                            Colonoscope was introduced through the anus and                            advanced to the the terminal ileum, with                            identification of the  appendiceal orifice and IC                            valve. The colonoscopy was performed without                            difficulty. The patient tolerated the procedure                            well. The quality of the bowel preparation was                            excellent. The ileocecal valve, appendiceal                            orifice, and rectum were photographed. The bowel                            preparation used was Miralax via split dose                            instruction. Scope In: 8:49:02 AM Scope Out: 9:03:27 AM Scope Withdrawal Time: 0 hours 12 minutes 8 seconds  Total Procedure Duration: 0 hours 14 minutes 25 seconds  Findings:                 The perianal and digital rectal examinations were                            normal.                           A diminutive polyp was found in the descending                            colon. The polyp was sessile. The polyp was removed                            with a cold snare. Resection and retrieval were  complete. Verification of patient identification                            for the specimen was done. Estimated blood loss was                            minimal.                           Scattered diverticula were found in the entire                            colon.                           The terminal ileum appeared normal.                           The exam was otherwise without abnormality on                            direct and retroflexion views. Complications:            No immediate complications. Estimated Blood Loss:     Estimated blood loss was minimal. Impression:               - One diminutive polyp in the descending colon,                            removed with a cold snare. Resected and retrieved.                           - Diverticulosis in the entire examined colon.                           - The examined portion of the ileum was normal.                            - The examination was otherwise normal on direct                            and retroflexion views. Recommendation:           - Patient has a contact number available for                            emergencies. The signs and symptoms of potential                            delayed complications were discussed with the                            patient. Return to normal activities tomorrow.                            Written discharge instructions were provided to the  patient.                           - Resume previous diet.                           - Continue present medications.                           - Repeat colonoscopy is recommended. The                            colonoscopy date will be determined after pathology                            results from today's exam become available for                            review.                           - Recent EGD ok, heme neg x 3, celiac serology                            negative                           Not clear why iron deficient but I would not work                            up further based upon all available information.                           If she fails to hold Hgb and ferritin NL consider                            exam of small intestine with capsule endoscopy                           Will also cc Dr. Lorenza Evangelist, MD 12/05/2018 9:16:18 AM This report has been signed electronically.

## 2018-12-05 NOTE — Progress Notes (Signed)
Called to room to assist during endoscopic procedure.  Patient ID and intended procedure confirmed with present staff. Received instructions for my participation in the procedure from the performing physician.  

## 2018-12-05 NOTE — Patient Instructions (Addendum)
I did not find anything bad.  One tiny polyp removed - certainly looks benign.  I will let you know pathology results and when to have another routine colonoscopy by mail and/or My Chart.  Also some diverticulosis - not a problem  I don't think you need any other testing.  I appreciate the opportunity to care for you. Gatha Mayer, MD, [facility]   Handouts given for polyps and diverticulosis.  YOU HAD AN ENDOSCOPIC PROCEDURE TODAY AT [facility]:   Refer to the procedure report that was given to you for any specific questions about what was found during the examination.  If the procedure report does not answer your questions, please call your gastroenterologist to clarify.  If you requested that your care partner not be given the details of your procedure findings, then the procedure report has been included in a sealed envelope for you to review at your convenience later.  YOU SHOULD EXPECT: Some feelings of bloating in the abdomen. Passage of more gas than usual.  Walking can help get rid of the air that was put into your GI tract during the procedure and reduce the bloating. If you had a lower endoscopy (such as a colonoscopy or flexible sigmoidoscopy) you may notice spotting of blood in your stool or on the toilet paper. If you underwent a bowel prep for your procedure, you may not have a normal bowel movement for a few days.  Please Note:  You might notice some irritation and congestion in your nose or some drainage.  This is from the oxygen used during your procedure.  There is no need for concern and it should clear up in a day or so.  SYMPTOMS TO REPORT IMMEDIATELY:   Following lower endoscopy (colonoscopy or flexible sigmoidoscopy):  Excessive amounts of blood in the stool  Significant tenderness or worsening of abdominal pains  Swelling of the abdomen that is new, acute  Fever of 100F or higher   For urgent or emergent issues, a gastroenterologist can be  reached at any hour by calling [phone].   DIET:  We do recommend a small meal at first, but then you may proceed to your regular diet.  Drink plenty of fluids but you should avoid alcoholic beverages for 24 hours.  ACTIVITY:  You should plan to take it easy for the rest of today and you should NOT DRIVE or use heavy machinery until tomorrow (because of the sedation medicines used during the test).    FOLLOW UP: Our staff will call the number listed on your records 48-72 hours following your procedure to check on you and address any questions or concerns that you may have regarding the information given to you following your procedure. If we do not reach you, we will leave a message.  We will attempt to reach you two times.  During this call, we will ask if you have developed any symptoms of COVID 19. If you develop any symptoms (for example fever, flu-like symptoms, shortness of breath, cough etc.) before then, please call [phone].  If any biopsies were taken you will be contacted by phone or by letter within the next 1-3 weeks.  Please call us at [phone] if you have not heard about the biopsies in 3 weeks.    SIGNATURES/CONFIDENTIALITY: You and/or your care partner have signed paperwork which will be entered into your electronic medical record.  These signatures attest to the fact that that the information above on your  After Visit Summary has been reviewed and is understood.  Full responsibility of the confidentiality of this discharge information lies with you and/or your care-partner.

## 2018-12-07 ENCOUNTER — Telehealth: Payer: Self-pay | Admitting: *Deleted

## 2018-12-07 NOTE — Telephone Encounter (Signed)
No answer for first follow up call will call back. SM

## 2018-12-08 ENCOUNTER — Telehealth: Payer: Self-pay | Admitting: *Deleted

## 2018-12-08 NOTE — Telephone Encounter (Signed)
No answer for second post procedure call left message for patient to call with questions or concerns. SM

## 2018-12-10 ENCOUNTER — Telehealth (INDEPENDENT_AMBULATORY_CARE_PROVIDER_SITE_OTHER): Payer: No Typology Code available for payment source | Admitting: Cardiology

## 2018-12-10 ENCOUNTER — Encounter: Payer: Self-pay | Admitting: Cardiology

## 2018-12-10 ENCOUNTER — Other Ambulatory Visit: Payer: Self-pay

## 2018-12-10 VITALS — BP 132/80 | HR 72 | Ht 62.0 in | Wt 150.0 lb

## 2018-12-10 DIAGNOSIS — E118 Type 2 diabetes mellitus with unspecified complications: Secondary | ICD-10-CM

## 2018-12-10 DIAGNOSIS — I10 Essential (primary) hypertension: Secondary | ICD-10-CM | POA: Diagnosis not present

## 2018-12-10 DIAGNOSIS — E119 Type 2 diabetes mellitus without complications: Secondary | ICD-10-CM

## 2018-12-10 DIAGNOSIS — E781 Pure hyperglyceridemia: Secondary | ICD-10-CM

## 2018-12-10 DIAGNOSIS — E782 Mixed hyperlipidemia: Secondary | ICD-10-CM

## 2018-12-10 NOTE — Progress Notes (Signed)
Virtual Visit via Video Note   This visit type was conducted due to national recommendations for restrictions regarding the COVID-19 Pandemic (e.g. social distancing) in an effort to limit this patient's exposure and mitigate transmission in our community.  Due to her co-morbid illnesses, this patient is at least at moderate risk for complications without adequate follow up.  This format is felt to be most appropriate for this patient at this time.  All issues noted in this document were discussed and addressed.  A limited physical exam was performed with this format.  Please refer to the patient's chart for her consent to telehealth for [facility].   Date:  12/10/2018   ID:  Nichole Wilson, DOB June 03, 1961, MRN [id number]  Patient Location: Other:  work Provider Location: Office  PCP:  Shelby Mattocks, PA-C  Cardiologist:  No primary care provider on file.  Electrophysiologist:  None   Evaluation Performed:  New Patient Evaluation  Chief Complaint: Hypertriglyceridemia  History of Present Illness:    Nichole Wilson is a 58 y.o. female with past medical history of essential hypertension, diet-controlled diabetes mellitus and marketed hypertriglyceridemia.  She is referred for this evaluation.  Her triglycerides were greater than 700.  She denies any problems at this time and takes care of activities of daily living.  No chest pain orthopnea or PND.  She walks about 45 minutes a day without any problems.  At the time of my evaluation, the patient is alert awake oriented and in no distress.  She mentions to me that her blood pressure is fine with diet and lifestyle modification and so is her diabetes.  At the time of my evaluation, the patient is alert awake oriented and in no distress.  The patient does not have symptoms concerning for COVID-19 infection (fever, chills, cough, or new shortness of breath).    Past Medical History:  Diagnosis Date  . Anemia   . Blood  transfusion without reported diagnosis   . Breast cyst, left 01-04-12   cyst no longer apparent  . Cancer [facility])    right breast cancer  . Cholelithiasis with choledocholithiasis   . Chronic calculus cholecystitis 12/25/2011   Lap chole on 03/25/12   . Complication of anesthesia 2002   PT STATES DURING HER C-SECTION SHE WAS AWARE OF INCISION AND PAIN -THEN WAS QUICKLY "PUT OUT"  . Family history of adverse reaction to anesthesia    father became aggressive  . Gallstone pancreatitis   . Glucose intolerance (impaired glucose tolerance)    since at least 2011.   [name] Hepatic steatosis   . Hypercholesterolemia with hypertriglyceridemia   . Hypertension 01-04-12   med d/c-last taken 12-07-11  -PT [place] STOPPED HER B/P MEDICATION BECAUSE HER B/P TOO LOW AND PT STATES HER B/P'S HAVE BEEN OK SINCE.  [name] Hypertriglyceridemia   . Migraine without aura, without mention of intractable migraine without mention of status migrainosus   . PONV (postoperative nausea and vomiting)    patient states that she did not get sick with mastectomy  . Pre-diabetes    Past Surgical History:  Procedure Laterality Date  . CESAREAN SECTION     2002  . CHOLECYSTECTOMY  03/25/2012   Procedure: LAPAROSCOPIC CHOLECYSTECTOMY WITH INTRAOPERATIVE CHOLANGIOGRAM;  Surgeon: Haywood Lasso, MD;  Location: [facility];  Service: General;  Laterality: N/A;  Laparoscopic Cholecystectomy with Intraoperative Cholangiogram  . COLONOSCOPY    . ERCP  12/05/2011   Procedure: ENDOSCOPIC RETROGRADE CHOLANGIOPANCREATOGRAPHY (ERCP);  Surgeon: Inda Castle, MD;  Location: [facility];  Service: Endoscopy;  Laterality: N/A;  . ERCP  12/08/2011   Procedure: ENDOSCOPIC RETROGRADE CHOLANGIOPANCREATOGRAPHY (ERCP);  Surgeon: Inda Castle, MD;  Location: [place];  Service: Gastroenterology;  Laterality: N/A;  . ESOPHAGOGASTRODUODENOSCOPY (EGD) WITH PROPOFOL N/A 09/28/2015   Procedure: ESOPHAGOGASTRODUODENOSCOPY (EGD) WITH PROPOFOL;   Surgeon: Clarene Essex, MD;  Location: [facility];  Service: Endoscopy;  Laterality: N/A;  H AND P IN DRAWER  . EUS N/A 10/13/2015   Procedure: UPPER ENDOSCOPIC ULTRASOUND (EUS) RADIAL;  Surgeon: Arta Silence, MD;  Location: [facility];  Service: Endoscopy;  Laterality: N/A;  . LAPAROSCOPIC PARTIAL GASTRECTOMY N/A 01/11/2016   Procedure: LAPAROSCOPIC PARTIAL GASTRECTOMY;  Surgeon: [name], MD;  Location: [facility];  Service: General;  Laterality: N/A;  . MASTECTOMY W/ SENTINEL NODE BIOPSY Right 09/15/2015   Procedure: RIGHT MASTECTOMY WITH SENTINEL LYMPH NODE BIOPSY;  Surgeon: Rolm Bookbinder, MD;  Location: [place];  Service: General;  Laterality: Right;  . UPPER GI ENDOSCOPY N/A 01/11/2016   Procedure: UPPER GI ENDOSCOPY;  Surgeon: [name], MD;  Location: [place];  Service: General;  Laterality: N/A;     Current Meds  Medication Sig  . Cholecalciferol (VITAMIN D3) 10000 units capsule Take 10,000 Units by mouth daily.  [name] Ethyl (VASCEPA) 1 g CAPS Take 2 capsules (2 g total) by mouth 2 (two) times daily.  . pantoprazole (PROTONIX) 40 MG tablet Take 1 tablet (40 mg total) by mouth daily before breakfast.  . RESTASIS 0.05 % ophthalmic emulsion   . vitamin B-12 (CYANOCOBALAMIN) 1000 MCG tablet Take 1,000 mcg by mouth daily.     Allergies:   Lisinopril and Other   Social History   Tobacco Use  . Smoking status: Never Smoker  . Smokeless tobacco: Never Used  Substance Use Topics  . Alcohol use: No  . Drug use: No     Family Hx: The patient's family history includes Diabetes in her brother, father, and mother; Heart disease in her paternal uncle; Liver disease in her brother. There is no history of Colon cancer, Esophageal cancer, Rectal cancer, or Stomach cancer.  ROS:   Please see the history of present illness.    As mentioned above All other systems reviewed and are negative.   Prior CV studies:   The following studies were reviewed today:  I reviewed records from  primary care physician and also lipid results extensively.  Labs/Other Tests and Data Reviewed:    EKG:  EKG dated reveals sinus rhythm and nonspecific ST-T changes and it was reviewed under the epic EKG section of 08/21/2017  Recent Labs: 11/06/2018: ALT 25; BUN 10; Creatinine, Ser 0.53; Hemoglobin 11.0; Platelets 405; Potassium 4.3; Sodium 142; TSH 1.370   Recent Lipid Panel Lab Results  Component Value Date/Time   CHOL 224 (H) 11/06/2018 11:20 AM   TRIG 766 (HH) 11/06/2018 11:20 AM   HDL 35 (L) 11/06/2018 11:20 AM   CHOLHDL 6.4 (H) 11/06/2018 11:20 AM   CHOLHDL 5 05/15/2018 09:38 AM   LDLCALC Comment 11/06/2018 11:20 AM   LDLDIRECT 132.0 05/15/2018 09:38 AM    Wt Readings from Last 3 Encounters:  12/10/18 150 lb (68 kg)  12/05/18 152 lb (68.9 kg)  11/13/18 152 lb (68.9 kg)     Objective:    Vital Signs:  BP 130/80 (BP Location: Left Arm, Patient Position: Sitting, Cuff Size: Normal)   Pulse 72   Ht 5\' 2"  (1.575 m)   Wt 150  lb (68 kg)   LMP 11/29/2011   BMI 27.44 kg/m    VITAL SIGNS:  reviewed  ASSESSMENT & PLAN:    1. Hypertriglyceridemia: I discussed diet with the patient at extensive length.  Her numbers were visited.  Education was given about diet and I will refer her to a dietitian for evaluation and management and discussion and counseling about diet for hypertriglyceridemia and diabetes mellitus.  She is agreeable for this. 2. We will initiate her on Vascepa 2 g twice daily and she will continue her excellent diet and exercise and will be back in 2 months for a Chem-7 and a liver lipid check 3. She has an excellent exercise program and I congratulated her to keep it up. 4. Diet was discussed for essential hypertension and diabetes mellitus and these issues are followed by primary care physician. 5. She was seen in follow-up appointment in 2 months or earlier if she has any concerns.  Her issues such as recent anemia evaluation and such are managed by her primary  care physician.  COVID-19 Education: The signs and symptoms of COVID-19 were discussed with the patient and how to seek care for testing (follow up with PCP or arrange E-visit).  The importance of social distancing was discussed today.  Time:   Today, I have spent 30 minutes with the patient with telehealth technology discussing the above problems.  Total amount of time for above evaluation and review of chart amongst other issues was 45 minutes   Medication Adjustments/Labs and Tests Ordered: Current medicines are reviewed at length with the patient today.  Concerns regarding medicines are outlined above.   Tests Ordered: No orders of the defined types were placed in this encounter.   Medication Changes: No orders of the defined types were placed in this encounter.   Disposition:  Follow up in 2 month(s)  Signed, Jenean Lindau, MD  12/10/2018 3:51 PM    [place]

## 2018-12-10 NOTE — Patient Instructions (Signed)
Medication Instructions:  Your physician recommends that you continue on your current medications as directed. Please refer to the Current Medication list given to you today.  If you need a refill on your cardiac medications before your next appointment, please call your pharmacy.   Lab work: Your physician recommends that you return for lab work in two months for a lipid, liver and BMP.  If you have labs (blood work) drawn today and your tests are completely normal, you will receive your results only by: [name] MyChart Message (if you have MyChart) OR . A paper copy in the mail If you have any lab test that is abnormal or we need to change your treatment, we will call you to review the results.  Testing/Procedures: The patient is asked to make an attempt to improve diet and exercise patterns to aid in medical management of this problem. You have been referred to nutrition and diabetic education at the [place]. You will be contacted to schedule this appt.     Follow-Up: At [facility], you and your health needs are our priority.  As part of our continuing mission to provide you with exceptional heart care, we have created designated Provider Care Teams.  These Care Teams include your primary Cardiologist (physician) and Advanced Practice Providers (APPs -  Physician Assistants and Nurse Practitioners) who all work together to provide you with the care you need, when you need it. You will need a follow up appointment in 2 months.

## 2018-12-10 NOTE — Addendum Note (Signed)
Addended by: Beckey Rutter on: 12/10/2018 04:35 PM   Modules accepted: Orders

## 2018-12-11 ENCOUNTER — Other Ambulatory Visit: Payer: Self-pay | Admitting: Hematology and Oncology

## 2018-12-11 ENCOUNTER — Ambulatory Visit: Payer: No Typology Code available for payment source

## 2018-12-11 DIAGNOSIS — D509 Iron deficiency anemia, unspecified: Secondary | ICD-10-CM

## 2018-12-12 ENCOUNTER — Other Ambulatory Visit: Payer: Self-pay | Admitting: Internal Medicine

## 2018-12-12 ENCOUNTER — Encounter: Payer: Self-pay | Admitting: Internal Medicine

## 2018-12-12 ENCOUNTER — Other Ambulatory Visit: Payer: Self-pay

## 2018-12-12 ENCOUNTER — Inpatient Hospital Stay: Payer: No Typology Code available for payment source | Attending: Hematology and Oncology

## 2018-12-12 DIAGNOSIS — E782 Mixed hyperlipidemia: Secondary | ICD-10-CM

## 2018-12-12 DIAGNOSIS — Z860101 Personal history of adenomatous and serrated colon polyps: Secondary | ICD-10-CM

## 2018-12-12 DIAGNOSIS — Z86010 Personal history of colonic polyps: Secondary | ICD-10-CM | POA: Insufficient documentation

## 2018-12-12 DIAGNOSIS — D509 Iron deficiency anemia, unspecified: Secondary | ICD-10-CM

## 2018-12-12 DIAGNOSIS — E781 Pure hyperglyceridemia: Secondary | ICD-10-CM

## 2018-12-12 DIAGNOSIS — C50411 Malignant neoplasm of upper-outer quadrant of right female breast: Secondary | ICD-10-CM | POA: Diagnosis present

## 2018-12-12 HISTORY — DX: Personal history of colonic polyps: Z86.010

## 2018-12-12 HISTORY — DX: Personal history of adenomatous and serrated colon polyps: Z86.0101

## 2018-12-12 LAB — CBC WITH DIFFERENTIAL (CANCER CENTER ONLY)
Abs Immature Granulocytes: 0.01 10*3/uL (ref 0.00–0.07)
Basophils Absolute: 0 10*3/uL (ref 0.0–0.1)
Basophils Relative: 1 %
Eosinophils Absolute: 0.1 10*3/uL (ref 0.0–0.5)
Eosinophils Relative: 1 %
HCT: 39.5 % (ref 36.0–46.0)
Hemoglobin: 13 g/dL (ref 12.0–15.0)
Immature Granulocytes: 0 %
Lymphocytes Relative: 39 %
Lymphs Abs: 2 10*3/uL (ref 0.7–4.0)
MCH: 30.2 pg (ref 26.0–34.0)
MCHC: 32.9 g/dL (ref 30.0–36.0)
MCV: 91.6 fL (ref 80.0–100.0)
Monocytes Absolute: 0.4 10*3/uL (ref 0.1–1.0)
Monocytes Relative: 8 %
Neutro Abs: 2.5 10*3/uL (ref 1.7–7.7)
Neutrophils Relative %: 51 %
Platelet Count: 256 10*3/uL (ref 150–400)
RBC: 4.31 MIL/uL (ref 3.87–5.11)
RDW: 16.4 % — ABNORMAL HIGH (ref 11.5–15.5)
WBC Count: 5 10*3/uL (ref 4.0–10.5)
nRBC: 0 % (ref 0.0–0.2)

## 2018-12-12 LAB — IRON AND TIBC
Iron: 85 ug/dL (ref 41–142)
Saturation Ratios: 29 % (ref 21–57)
TIBC: 295 ug/dL (ref 236–444)
UIBC: 210 ug/dL (ref 120–384)

## 2018-12-12 LAB — SAMPLE TO BLOOD BANK

## 2018-12-12 LAB — FERRITIN: Ferritin: 128 ng/mL (ref 11–307)

## 2018-12-12 NOTE — Progress Notes (Signed)
Diminutive adenoma Colon recall 2027 Letter to patient via My Chart

## 2018-12-18 LAB — LIPID PANEL
Chol/HDL Ratio: 6.1 ratio — ABNORMAL HIGH (ref 0.0–4.4)
Cholesterol, Total: 264 mg/dL — ABNORMAL HIGH (ref 100–199)
HDL: 43 mg/dL (ref >39)
Triglycerides: 583 mg/dL (ref 0–149)

## 2018-12-19 ENCOUNTER — Ambulatory Visit (INDEPENDENT_AMBULATORY_CARE_PROVIDER_SITE_OTHER): Payer: No Typology Code available for payment source | Admitting: Internal Medicine

## 2018-12-19 ENCOUNTER — Other Ambulatory Visit: Payer: Self-pay

## 2018-12-19 ENCOUNTER — Encounter: Payer: Self-pay | Admitting: Internal Medicine

## 2018-12-19 VITALS — BP 130/76 | HR 98 | Temp 98.3°F | Ht 62.2 in | Wt 155.0 lb

## 2018-12-19 DIAGNOSIS — Z853 Personal history of malignant neoplasm of breast: Secondary | ICD-10-CM

## 2018-12-19 DIAGNOSIS — R202 Paresthesia of skin: Secondary | ICD-10-CM | POA: Diagnosis not present

## 2018-12-19 DIAGNOSIS — N6459 Other signs and symptoms in breast: Secondary | ICD-10-CM | POA: Diagnosis not present

## 2018-12-19 NOTE — Progress Notes (Signed)
Subjective:     Patient ID: Nichole Wilson , female    DOB: 1961-03-17 , 58 y.o.   MRN: [id number]   Chief Complaint  Patient presents with  . Numbness    ARM AND MAMMO    HPI Pt is here for a couple of complaints 1- for the past 3 months noticed L nipple inversion and this week noticed outer L breast is tender. Last mammogram was 9 months ago and was normal 2- Has been having numbness and tingling of L fingers and some mornings wakes up with her L arm aching and fingers tingling. After about 4 hours once she is walking around and moves her hands resolves.  Denies weakness to grasp things, or repetitive hand use. She works as an Astronomer. She admits she looks down a lot for textting.   She has seen a cardiologist and was placed on vacepa due to high triglycerides and so far has gone down from 700's to 500's. Was told to follow strict diet and she loved eating cheese and has stopped this.  Has bee strict with her does and will Fu next month.  Sleep apnea test due next week.   Past Medical History:  Diagnosis Date  . Anemia   . Blood transfusion without reported diagnosis   . Breast cyst, left 01-04-12   cyst no longer apparent  . Cancer [facility])    right breast cancer  . Cholelithiasis with choledocholithiasis   . Chronic calculus cholecystitis 12/25/2011   Lap chole on 03/25/12   . Complication of anesthesia 2002   PT STATES DURING HER C-SECTION SHE WAS AWARE OF INCISION AND PAIN -THEN WAS QUICKLY "PUT OUT"  . Family history of adverse reaction to anesthesia    father became aggressive  . Gallstone pancreatitis   . Glucose intolerance (impaired glucose tolerance)    since at least 2011.   [name] Hepatic steatosis   . Hx of adenomatous polyp of colon 12/12/2018  . Hypercholesterolemia with hypertriglyceridemia   . Hypertension 01-04-12   med d/c-last taken 12-07-11  -PT [place] STOPPED HER B/P MEDICATION BECAUSE HER B/P TOO LOW AND PT STATES HER B/P'S HAVE BEEN OK  SINCE.  [name] Hypertriglyceridemia   . Migraine without aura, without mention of intractable migraine without mention of status migrainosus   . PONV (postoperative nausea and vomiting)    patient states that she did not get sick with mastectomy  . Pre-diabetes      Family History  Problem Relation Age of Onset  . Diabetes Mother   . Diabetes Father   . Diabetes Brother   . Heart disease Paternal Uncle   . Liver disease Brother   . Colon cancer Neg Hx   . Esophageal cancer Neg Hx   . Rectal cancer Neg Hx   . Stomach cancer Neg Hx      Current Outpatient Medications:  .  Cholecalciferol (VITAMIN D3) 10000 units capsule, Take 10,000 Units by mouth daily., Disp: , Rfl:  .  pantoprazole (PROTONIX) 40 MG tablet, Take 1 tablet (40 mg total) by mouth daily before breakfast., Disp: 90 tablet, Rfl: 3 .  RESTASIS 0.05 % ophthalmic emulsion, , Disp: , Rfl: 2 .  VASCEPA 1 g CAPS, TAKE 2 CAPSULES BY MOUTH TWICE DAILY, Disp: 360 capsule, Rfl: 1 .  vitamin B-12 (CYANOCOBALAMIN) 1000 MCG tablet, Take 1,000 mcg by mouth daily., Disp: , Rfl:    Allergies  Allergen Reactions  . Lisinopril Swelling  . Other  General anesthesia-severe vomiting     Review of Systems  Review of Systems  Constitutional: Negative for diaphoresis and unexpected weight change.  HENT: Negative for tinnitus.   Eyes: Negative for visual disturbance.  NECK- gets pains off and on, denies MVA or past neck injury. Never seen a chiropractor.  Respiratory: Negative for chest tightness and shortness of breath.   Cardiovascular: Negative for chest pain, palpitations and leg swelling.  Gastrointestinal: Negative for constipation, diarrhea and nausea.  Endocrine: Negative for polydipsia, polyphagia and polyuria.  Genitourinary: Negative for dysuria and frequency.  Skin: Negative for rash and wound.  Neurological: Negative for dizziness, speech difficulty, weakness.  L arm numbness x 3-4 days when she wakes up in the am. And  gets better when she moves it around  Today's Vitals   12/19/18 1128  BP: 130/76  Pulse: (!) 117  Temp: 98.3 F (36.8 C)  TempSrc: Oral  SpO2: 97%  Weight: 155 lb (70.3 kg)  Height: 5' 2.2" (1.58 m)   Body mass index is 28.17 kg/m.   Objective:  Physical Exam   Constitutional: She is oriented to person, place, and time. She appears well-developed and well-nourished. No distress.  HENT:  Head: Normocephalic and atraumatic.  Right Ear: External ear normal.  Left Ear: External ear normal.  Nose: Nose normal.  Eyes: Conjunctivae are normal. Right eye exhibits no discharge. Left eye exhibits no discharge. No scleral icterus.  Neck: Neck supple. No thyromegaly present. L lateral flexion provoked L neck/ mid trapezius pain.  No carotid bruits bilaterally  Cardiovascular: Normal rate and regular rhythm. Rate 98. No murmur heard. Pulmonary/Chest: Effort normal and breath sounds normal. No respiratory distress.  Musculoskeletal: Normal range of motion. She exhibits no edema.  Lymphadenopathy:    She has no cervical adenopathy.  Neurological: She is alert and oriented to person, place, and time. Has +2/4 upper extremity reflexes, symmetric strength and normal sensation.   Skin: Skin is warm and dry. Capillary refill takes less than 2 seconds. No rash noted. She is not diaphoretic.  Psychiatric: She has a normal mood and affect. Her behavior is normal. Judgment and thought content normal.  Nursing note reviewed.  Assessment And Plan:   1. Inversion of nipple- new - MM Digital Diagnostic Unilat L; Future  2. Personal history of breast cancer - MM Digital Diagnostic Unilat L; Future  3. Paresthesias in left hand- acute. Could be from looking down from texting.     I advised to await on her mammogram to make sure is not related to recurrent breast     CA. In the mean time was advised to avoid looking down all the time and to bring things to her eye level to prevent neck strain. If  mammogram is neg, and symptoms continue with current recommendations, then we will send her to neurology.    Randal Yepiz [name], PA-C    THE PATIENT IS ENCOURAGED TO PRACTICE SOCIAL DISTANCING DUE TO THE COVID-19 PANDEMIC.

## 2018-12-23 ENCOUNTER — Other Ambulatory Visit: Payer: Self-pay | Admitting: Internal Medicine

## 2018-12-23 DIAGNOSIS — Z853 Personal history of malignant neoplasm of breast: Secondary | ICD-10-CM

## 2018-12-23 DIAGNOSIS — N6459 Other signs and symptoms in breast: Secondary | ICD-10-CM

## 2018-12-26 ENCOUNTER — Ambulatory Visit (INDEPENDENT_AMBULATORY_CARE_PROVIDER_SITE_OTHER): Payer: No Typology Code available for payment source | Admitting: Neurology

## 2018-12-26 ENCOUNTER — Other Ambulatory Visit: Payer: Self-pay

## 2018-12-26 ENCOUNTER — Encounter: Payer: Self-pay | Admitting: Neurology

## 2018-12-26 VITALS — BP 166/98 | HR 92 | Ht 62.0 in | Wt 158.0 lb

## 2018-12-26 DIAGNOSIS — R600 Localized edema: Secondary | ICD-10-CM

## 2018-12-26 DIAGNOSIS — R0683 Snoring: Secondary | ICD-10-CM

## 2018-12-26 DIAGNOSIS — G478 Other sleep disorders: Secondary | ICD-10-CM | POA: Diagnosis not present

## 2018-12-26 DIAGNOSIS — G4719 Other hypersomnia: Secondary | ICD-10-CM

## 2018-12-26 DIAGNOSIS — E663 Overweight: Secondary | ICD-10-CM | POA: Diagnosis not present

## 2018-12-26 NOTE — Progress Notes (Signed)
Subjective:    Patient ID: Nichole Wilson is a 58 y.o. female.  HPI     Star Age, MD, PhD [facility] [address], [address] [place], [place] [address]  Dear Doreene Burke,   I saw your patient, Nichole Wilson, upon your kind request in my sleep clinic today for initial consultation of her sleep disorder, in particular, concern for underlying obstructive sleep apnea.  The patient was not able to participate in a virtual visit on 12/03/2018.  She is unaccompanied today.  As you know, Nichole Wilson is a 58 year old right-handed woman with an underlying medical history of hypertension, hyperlipidemia, hepatic steatosis, glucose intolerance, history of gallstone pancreatitis, prediabetes, breast cancer with status post right mastectomy in 2017, history of lung nodules, iron deficiency and anemia, and overweight state, who reports snoring and excessive daytime somnolence.  I reviewed your office note from 11/06/2018.   The patient's Epworth sleepiness score is 5 out of 24, fatigue severity score is 33 out of 63.  She reports that she recently required transfusion.  She is tired during the day not always frankly sleepy.  She does snore.  She denies night to night nocturia or morning headaches.  She has noticed lower extremity edema.  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  She does not drink caffeine daily, weight has been fairly stable.  She is a non-smoker and does not utilize alcohol.  She is not aware of any family history of OSA.  She does not typically watch TV in her bedroom, typically is in bed by 10 or 1030, latest by 11 and rise time is usually around 6.  She lives with her husband and her 2 daughters, ages 54 and 13.  They have no pets in the household.  Her Past Medical History Is Significant For: Past Medical History:  Diagnosis Date  . Anemia   . Blood transfusion without reported diagnosis   . Breast cyst, left 01-04-12   cyst no longer apparent  . Cancer  [facility])    right breast cancer  . Cholelithiasis with choledocholithiasis   . Chronic calculus cholecystitis 12/25/2011   Lap chole on 03/25/12   . Complication of anesthesia 2002   PT STATES DURING HER C-SECTION SHE WAS AWARE OF INCISION AND PAIN -THEN WAS QUICKLY "PUT OUT"  . Family history of adverse reaction to anesthesia    father became aggressive  . Gallstone pancreatitis   . Glucose intolerance (impaired glucose tolerance)    since at least 2011.   [name] Hepatic steatosis   . Hx of adenomatous polyp of colon 12/12/2018  . Hypercholesterolemia with hypertriglyceridemia   . Hypertension 01-04-12   med d/c-last taken 12-07-11  -PT [place] STOPPED HER B/P MEDICATION BECAUSE HER B/P TOO LOW AND PT STATES HER B/P'S HAVE BEEN OK SINCE.  [name] Hypertriglyceridemia   . Migraine without aura, without mention of intractable migraine without mention of status migrainosus   . PONV (postoperative nausea and vomiting)    patient states that she did not get sick with mastectomy  . Pre-diabetes     Her Past Surgical History Is Significant For: Past Surgical History:  Procedure Laterality Date  . CESAREAN SECTION     2002  . CHOLECYSTECTOMY  03/25/2012   Procedure: LAPAROSCOPIC CHOLECYSTECTOMY WITH INTRAOPERATIVE CHOLANGIOGRAM;  Surgeon: Haywood Lasso, MD;  Location: [facility];  Service: General;  Laterality: N/A;  Laparoscopic Cholecystectomy with Intraoperative Cholangiogram  . COLONOSCOPY    . ERCP  12/05/2011   Procedure: ENDOSCOPIC RETROGRADE CHOLANGIOPANCREATOGRAPHY (ERCP);  Surgeon: Inda Castle, MD;  Location: [facility];  Service: Endoscopy;  Laterality: N/A;  . ERCP  12/08/2011   Procedure: ENDOSCOPIC RETROGRADE CHOLANGIOPANCREATOGRAPHY (ERCP);  Surgeon: Inda Castle, MD;  Location: [facility];  Service: Gastroenterology;  Laterality: N/A;  . ESOPHAGOGASTRODUODENOSCOPY (EGD) WITH PROPOFOL N/A 09/28/2015   Procedure: ESOPHAGOGASTRODUODENOSCOPY (EGD) WITH PROPOFOL;  Surgeon: Clarene Essex, MD;  Location[facility];  Service: Endoscopy;  Laterality: N/A;  H AND P IN DRAWER  . EUS N/A 10/13/2015   Procedure: UPPER ENDOSCOPIC ULTRASOUND (EUS) RADIAL;  Surgeon: Arta Silence, MD;  Location: [facility];  Service: Endoscopy;  Laterality: N/A;  . LAPAROSCOPIC PARTIAL GASTRECTOMY N/A 01/11/2016   Procedure: LAPAROSCOPIC PARTIAL GASTRECTOMY;  Surgeon: [name], MD;  Location: [facility];  Service: General;  Laterality: N/A;  . MASTECTOMY W/ SENTINEL NODE BIOPSY Right 09/15/2015   Procedure: RIGHT MASTECTOMY WITH SENTINEL LYMPH NODE BIOPSY;  Surgeon: Rolm Bookbinder, MD;  Location: [facility];  Service: General;  Laterality: Right;  . UPPER GI ENDOSCOPY N/A 01/11/2016   Procedure: UPPER GI ENDOSCOPY;  Surgeon: [name], MD;  Location: [facility];  Service: General;  Laterality: N/A;    Her Family History Is Significant For: Family History  Problem Relation Age of Onset  . Diabetes Mother   . Diabetes Father   . Diabetes Brother   . Heart disease Paternal Uncle   . Liver disease Brother   . Colon cancer Neg Hx   . Esophageal cancer Neg Hx   . Rectal cancer Neg Hx   . Stomach cancer Neg Hx     Her Social History Is Significant For: Social History   Socioeconomic History  . Marital status: Married    Spouse name: Not on file  . Number of children: 2  . Years of education: Not on file  . Highest education level: Not on file  Occupational History  . 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   Social Needs  . Financial resource strain: Not on file  . Food insecurity    Worry: Not on file    Inability: Not on file  . Transportation needs    Medical: Not on file    Non-medical: Not on file  Tobacco Use  . Smoking status: Never Smoker  . Smokeless tobacco: Never Used  Substance and Sexual Activity  . Alcohol use: No  . Drug use: No  . Sexual activity: Yes  Lifestyle  . Physical activity     Days per week: Not on file    Minutes per session: Not on file  . Stress: Not on file  Relationships  . Social Herbalist on phone: Not on file    Gets together: Not on file    Attends religious service: Not on file    Active member of club or organization: Not on file    Attends meetings of clubs or organizations: Not on file    Relationship status: Not on file  Other Topics Concern  . Not on file  Social History Narrative   Married, husband is a Dealer   2 young adult daughters   - spanish interpreter   + children   No caffeine    Her Allergies Are:  Allergies  Allergen Reactions  . Lisinopril Swelling  . Other     General anesthesia-severe vomiting  :  Her Current Medications Are:  Outpatient Encounter Medications as of 12/26/2018  Medication Sig  . Cholecalciferol (VITAMIN D3) 10000 units capsule Take 10,000 Units by mouth daily.  . pantoprazole (PROTONIX) 40 MG tablet Take 1 tablet (40 mg total) by mouth daily before breakfast.  . RESTASIS 0.05 % ophthalmic emulsion   . VASCEPA 1 g CAPS TAKE 2 CAPSULES BY MOUTH TWICE DAILY  . vitamin B-12 (CYANOCOBALAMIN) 1000 MCG tablet Take 1,000 mcg by mouth daily.   No facility-administered encounter medications on file as of 12/26/2018.   :  Review of Systems:  Out of a complete 14 point review of systems, all are reviewed and negative with the exception of these symptoms as listed below: Review of Systems  Neurological:       Pt presents today to discuss her sleep.  Pt reports that she has never had a sleep study but does endorse snoring   Epworth Sleepiness Scale 0= would never doze 1= slight chance of dozing 2= moderate chance of dozing 3= high chance of dozing  Sitting and reading: 1 Watching TV: 1 Sitting inactive in a public place (ex. Theater or meeting): 0 As a passenger in a car for an hour without a break: 1 Lying down to rest in the afternoon: 1 Sitting and talking to someone: 0 Sitting  quietly after lunch (no alcohol): 1 In a car, while stopped in traffic: 0 Total: 5    Objective:  Neurological Exam  Physical Exam Physical Examination:   Vitals:   12/26/18 1532  BP: (!) 166/98  Pulse: 92    General Examination: The patient is a very pleasant 58 y.o. female in no acute distress. She appears well-developed and well-nourished and well groomed.   HEENT: Normocephalic, atraumatic, pupils are equal, round and reactive to light and accommodation. Funduscopic exam is normal with sharp disc margins noted. Extraocular tracking is good without limitation to gaze excursion or nystagmus noted. Normal smooth pursuit is noted. Hearing is grossly intact. Tympanic membranes are clear bilaterally. Face is symmetric with normal facial animation and normal facial sensation. Speech is clear with no dysarthria noted. There is no hypophonia. There is no lip, neck/head, jaw or voice tremor. Neck is supple with full range of passive and active motion. There are no carotid bruits on auscultation. Oropharynx exam reveals: mild mouth dryness, adequate dental hygiene and moderate airway crowding, due to Small airway entry, tonsils in place, slightly elongated uvula.  Mallampati is class II.  Tongue protrudes centrally in palate elevates symmetrically.  Neck circumference is 15-1/4 inches.  She has a minimal overbite.  Chest: Clear to auscultation without wheezing, rhonchi or crackles noted.  Heart: S1+S2+0, regular and normal without murmurs, rubs or gallops noted.   Abdomen: Soft, non-tender and non-distended with normal bowel sounds appreciated on auscultation.  Extremities: There is 1+ pitting edema in the distal lower extremities bilaterally. Pedal pulses are intact.  Skin: Warm and dry without trophic changes noted. There are no varicose veins.  Musculoskeletal: exam reveals no obvious joint deformities, tenderness or joint swelling or erythema.   Neurologically:  Mental status: The  patient is awake, alert and oriented in all 4 spheres. Her immediate and remote memory, attention, language skills and fund of knowledge are appropriate. There is no evidence of aphasia, agnosia, apraxia or anomia. Speech is clear with normal prosody and enunciation. Thought process is linear. Mood is normal and affect is normal.  Cranial nerves II - XII are as described above under HEENT exam.  In addition: shoulder shrug is normal with equal shoulder height noted. Motor exam: Normal bulk, strength and tone is noted. There is no drift, tremor or rebound. Romberg is negative. Reflexes are 2+ in the UEs and LEs. Fine motor skills and coordination: intact with normal finger taps, normal hand movements, normal rapid alternating patting, normal foot taps and normal foot agility.  Cerebellar testing: No dysmetria or intention tremor on finger to nose testing. Heel to shin is unremarkable bilaterally. There is no truncal or gait ataxia.  Sensory exam: intact to light touch in the upper and lower extremities.  Gait, station and balance: She stands easily. No veering to one side is noted. No leaning to one side is noted. Posture is age-appropriate and stance is narrow based. Gait shows normal stride length and normal pace. No problems turning are noted. Tandem walk is unremarkable.      Assessment and Plan:  In summary, Nichole Wilson is a very pleasant 58 y.o.-year old female with an underlying medical history of hypertension, hyperlipidemia, hepatic steatosis, glucose intolerance, history of gallstone pancreatitis, prediabetes, breast cancer with status post right mastectomy in 2017, history of lung nodules, iron deficiency and anemia, and overweight state, whose history and physical exam concerning for obstructive sleep apnea (OSA). I had a long chat with the patient about my findings and the diagnosis of OSA, its prognosis and treatment options. We talked about medical treatments, surgical interventions and  non-pharmacological approaches. I explained in particular the risks and ramifications of untreated moderate to severe OSA, especially with respect to developing cardiovascular disease down the Road, including congestive heart failure, difficult to treat hypertension, cardiac arrhythmias, or stroke. Even type 2 diabetes has, in part, been linked to untreated OSA. Symptoms of untreated OSA include daytime sleepiness, memory problems, mood irritability and mood disorder such as depression and anxiety, lack of energy, as well as recurrent headaches, especially morning headaches. We talked about trying to maintain a healthy lifestyle in general, as well as the importance of weight control. I encouraged the patient to eat healthy, exercise daily and keep well hydrated, to keep a scheduled bedtime and wake time routine, to not skip any meals and eat healthy snacks in between meals. I advised the patient not to drive when feeling sleepy. I recommended the following at this time: sleep study.   I explained the sleep test procedure to the patient and also outlined possible surgical and non-surgical treatment options of OSA, including the use of a custom-made dental device (which would require a referral to a specialist dentist or oral surgeon), upper airway surgical options, such as pillar implants, radiofrequency surgery, tongue base surgery, and UPPP (which would involve a referral to an ENT surgeon). Rarely, jaw surgery such as mandibular advancement may be considered.  I also explained the CPAP treatment option to the patient, who indicated that she would be willing to try CPAP if the need arises. I explained the importance of being compliant with PAP treatment, not only for insurance purposes but primarily to improve Her symptoms, and for the patient's long term health benefit, including to reduce Her cardiovascular risks. I answered all her questions today and the patient was in agreement. I would like to see her  back after the sleep study is completed and encouraged her to call with any interim questions, concerns, problems or updates.   Thank you very much for allowing me to participate in the care of this nice patient. If I can be of any further  assistance to you please do not hesitate to call me at [phone].  Sincerely,   Star Age, MD, PhD

## 2018-12-26 NOTE — Patient Instructions (Signed)

## 2019-01-02 ENCOUNTER — Other Ambulatory Visit: Payer: Self-pay

## 2019-01-02 ENCOUNTER — Ambulatory Visit
Admission: RE | Admit: 2019-01-02 | Discharge: 2019-01-02 | Disposition: A | Discharge status: Home / Self Care | Payer: No Typology Code available for payment source | Source: Ambulatory Visit | Attending: Internal Medicine | Admitting: Internal Medicine

## 2019-01-02 DIAGNOSIS — Z853 Personal history of malignant neoplasm of breast: Secondary | ICD-10-CM

## 2019-01-02 DIAGNOSIS — N6459 Other signs and symptoms in breast: Secondary | ICD-10-CM

## 2019-01-29 ENCOUNTER — Ambulatory Visit (INDEPENDENT_AMBULATORY_CARE_PROVIDER_SITE_OTHER): Payer: No Typology Code available for payment source | Admitting: Neurology

## 2019-01-29 ENCOUNTER — Other Ambulatory Visit: Payer: Self-pay

## 2019-01-29 DIAGNOSIS — G4720 Circadian rhythm sleep disorder, unspecified type: Secondary | ICD-10-CM

## 2019-01-29 DIAGNOSIS — G4733 Obstructive sleep apnea (adult) (pediatric): Secondary | ICD-10-CM

## 2019-01-29 DIAGNOSIS — E663 Overweight: Secondary | ICD-10-CM

## 2019-01-29 DIAGNOSIS — R0683 Snoring: Secondary | ICD-10-CM

## 2019-01-29 DIAGNOSIS — G478 Other sleep disorders: Secondary | ICD-10-CM

## 2019-01-29 DIAGNOSIS — G4719 Other hypersomnia: Secondary | ICD-10-CM

## 2019-01-29 DIAGNOSIS — R600 Localized edema: Secondary | ICD-10-CM

## 2019-02-04 ENCOUNTER — Telehealth: Payer: No Typology Code available for payment source | Admitting: Cardiology

## 2019-02-04 NOTE — Progress Notes (Signed)
Patient referred by [name], NP, seen by me on 12/26/18, diagnostic PSG on 01/29/19.   Please call and notify the patient that the recent sleep study showed moderate to severe obstructive sleep apnea, with a total AHI of 23.7/hour, REM AHI of 56.6/hour, supine AHI of 27.3/hour and O2 nadir of 78%. I recommend treatment for this in the form of CPAP. This will require a repeat sleep study for proper titration and mask fitting and correct monitoring of the oxygen saturations. Please explain to patient. I have placed an order in the chart. Thanks.  Star Age, MD, PhD [facility])

## 2019-02-04 NOTE — Addendum Note (Signed)
Addended by: Star Age on: 02/04/2019 05:26 PM   Modules accepted: Orders

## 2019-02-04 NOTE — Procedures (Signed)
PATIENT'S NAME:  Nichole Wilson, Nichole Wilson DOB:      [birth year]      MR#:    [id number]     DATE OF RECORDING: 01/29/2019 REFERRING M.D.:  [name] FNP Study Performed:   Baseline Polysomnogram HISTORY: 58 year old woman with a history of hypertension, hyperlipidemia, hepatic steatosis, glucose intolerance, history of gallstone pancreatitis, prediabetes, breast cancer with status post right mastectomy in 2017, history of lung nodules, iron deficiency and anemia, and overweight state, who reports snoring and excessive daytime somnolence. The patient endorsed the Epworth Sleepiness Scale at 5 points. The patient's weight 158 pounds with a height of 62 (inches), resulting in a BMI of 29.2 kg/m2. The patient's neck circumference measured 15.2 inches.  CURRENT MEDICATIONS: Vitamin D3, Protonix, Vascepa,  Vitamin B12   PROCEDURE:  This is a multichannel digital polysomnogram utilizing the Somnostar 11.2 system.  Electrodes and sensors were applied and monitored per AASM Specifications.   EEG, EOG, Chin and Limb EMG, were sampled at 200 Hz.  ECG, Snore and Nasal Pressure, Thermal Airflow, Respiratory Effort, CPAP Flow and Pressure, Oximetry was sampled at 50 Hz. Digital video and audio were recorded.      BASELINE STUDY  Lights Out was at 22:17 and Lights On at 05:00.  Total recording time (TRT) was 403.5 minutes, with a total sleep time (TST) of 293.5 minutes.   The patient's sleep latency was 67 minutes, which is delayed. REM latency was 126 minutes, which is mildly delayed. The sleep efficiency was 72.7%, which is reduced.     SLEEP ARCHITECTURE: WASO (Wake after sleep onset) was 79 minutes.  There were 6.5 minutes in Stage N1, 193.5 minutes Stage N2, 49 minutes Stage N3 and 44.5 minutes in Stage REM.  The percentage of Stage N1 was 2.2%, Stage N2 was 65.9%, which is increased, Stage N3 was 16.7%, which is normal, and Stage R (REM sleep) was 15.2%, which is mildly reduced. The arousals were noted as: 65 were  spontaneous, 0 were associated with PLMs, 39 were associated with respiratory events.  RESPIRATORY ANALYSIS:  There were a total of 116 respiratory events:  37 obstructive apneas, 0 central apneas and 1 mixed apneas with a total of 38 apneas and an apnea index (AI) of 7.8 /hour. There were 78 hypopneas with a hypopnea index of 15.9 /hour. The patient also had 0 respiratory event related arousals (RERAs).      The total APNEA/HYPOPNEA INDEX (AHI) was 23.7/hour and the total RESPIRATORY DISTURBANCE INDEX was 23.7 /hour.  42 events occurred in REM sleep and 106 events in NREM. The REM AHI was 56.6 /hour, versus a non-REM AHI of 17.8. The patient spent 252.5 minutes of total sleep time in the supine position and 41 minutes in non-supine.. The supine AHI was 27.3 versus a non-supine AHI of 1.5.  OXYGEN SATURATION & C02:  The Wake baseline 02 saturation was 97%, with the lowest being 78%. Time spent below 89% saturation equaled 31 minutes.  PERIODIC LIMB MOVEMENTS: The patient had a total of 0 Periodic Limb Movements.  The Periodic Limb Movement (PLM) index was 0 and the PLM Arousal index was 0/hour.  Audio and video analysis did not show any abnormal or unusual movements, behaviors, phonations or vocalizations. The patient took no bathroom breaks. Mild to moderate snoring was noted. The EKG was in keeping with normal sinus rhythm (NSR).  Post-study, the patient indicated that sleep was worse than usual.   IMPRESSION: 1. Obstructive Sleep Apnea (OSA) 2. Dysfunctions associated with  sleep stages or arousal from sleep  RECOMMENDATIONS: 1. This study demonstrates moderate to severe obstructive sleep apnea, with a total AHI of 23.7/hour, REM AHI of 56.6/hour, supine AHI of 27.3/hour and O2 nadir of 78%. Treatment with positive airway pressure in the form of CPAP is recommended. This will require a full night titration study to optimize therapy. Other treatment options may include - generally speaking -  avoidance of supine sleep position along with weight loss, upper airway or jaw surgery in selected patients or the use of an oral appliance in certain patients. ENT evaluation and/or consultation with a maxillofacial surgeon or dentist may be feasible in some instances.    2. Please note that untreated obstructive sleep apnea may carry additional perioperative morbidity. Patients with significant obstructive sleep apnea should receive perioperative PAP therapy and the surgeons and particularly the anesthesiologist should be informed of the diagnosis and the severity of the sleep disordered breathing. 3. This study shows sleep fragmentation and abnormal sleep stage percentages; these are nonspecific findings and per se do not signify an intrinsic sleep disorder or a cause for the patient's sleep-related symptoms. Causes include (but are not limited to) the first night effect of the sleep study, circadian rhythm disturbances, medication effect or an underlying mood disorder or medical problem.  4. The patient should be cautioned not to drive, work at heights, or operate dangerous or heavy equipment when tired or sleepy. Review and reiteration of good sleep hygiene measures should be pursued with any patient. 5. The patient will be seen in follow-up in the sleep clinic at [facility] for discussion of the test results, symptom and treatment compliance review, further management strategies, etc. The referring provider will be notified of the test results.  I certify that I have reviewed the entire raw data recording prior to the issuance of this report in accordance with the Standards of Accreditation of the American Academy of Sleep Medicine (AASM)  Star Age, MD, PhD Diplomat, American Board of Neurology and Sleep Medicine (Neurology and Sleep Medicine)

## 2019-02-05 ENCOUNTER — Ambulatory Visit: Payer: No Typology Code available for payment source | Admitting: Nurse Practitioner

## 2019-02-05 ENCOUNTER — Telehealth: Payer: Self-pay

## 2019-02-05 ENCOUNTER — Ambulatory Visit: Payer: No Typology Code available for payment source | Admitting: Internal Medicine

## 2019-02-05 NOTE — Telephone Encounter (Signed)
-----   Message from Star Age, MD sent at 02/04/2019  5:26 PM EDT ----- Patient referred by [name], NP, seen by me on 12/26/18, diagnostic PSG on 01/29/19.   Please call and notify the patient that the recent sleep study showed moderate to severe obstructive sleep apnea, with a total AHI of 23.7/hour, REM AHI of 56.6/hour, supine AHI of 27.3/hour and O2 nadir of 78%. I recommend treatment for this in the form of CPAP. This will require a repeat sleep study for proper titration and mask fitting and correct monitoring of the oxygen saturations. Please explain to patient. I have placed an order in the chart. Thanks.  Star Age, MD, PhD [facility])

## 2019-02-05 NOTE — Telephone Encounter (Signed)
I called pt. I advised pt that Dr. Athar reviewed their sleep study results and found that has moderate to severe osa and recommends that pt be treated with a cpap. Dr. Athar recommends that pt return for a repeat sleep study in order to properly titrate the cpap and ensure a good mask fit. Pt is agreeable to returning for a titration study. I advised pt that our sleep lab will file with pt's insurance and call pt to schedule the sleep study when we hear back from the pt's insurance regarding coverage of this sleep study. Pt verbalized understanding of results. Pt had no questions at this time but was encouraged to call back if questions arise.   

## 2019-02-11 ENCOUNTER — Other Ambulatory Visit: Payer: Self-pay

## 2019-02-13 MED FILL — PANTOPRAZOLE SOD DR 40 MG T: 40 | 90 days supply | Qty: 90 | Fill #1

## 2019-02-18 ENCOUNTER — Telehealth (INDEPENDENT_AMBULATORY_CARE_PROVIDER_SITE_OTHER): Payer: No Typology Code available for payment source | Admitting: Cardiology

## 2019-02-18 ENCOUNTER — Other Ambulatory Visit: Payer: Self-pay

## 2019-02-18 ENCOUNTER — Encounter: Payer: Self-pay | Admitting: Cardiology

## 2019-02-18 VITALS — BP 184/66 | HR 64 | Ht 62.0 in | Wt 148.0 lb

## 2019-02-18 DIAGNOSIS — E118 Type 2 diabetes mellitus with unspecified complications: Secondary | ICD-10-CM

## 2019-02-18 DIAGNOSIS — E782 Mixed hyperlipidemia: Secondary | ICD-10-CM

## 2019-02-18 DIAGNOSIS — I10 Essential (primary) hypertension: Secondary | ICD-10-CM

## 2019-02-18 NOTE — Progress Notes (Signed)
Virtual Visit via Telephone Note   This visit type was conducted due to national recommendations for restrictions regarding the COVID-19 Pandemic (e.g. social distancing) in an effort to limit this patient's exposure and mitigate transmission in our community.  Due to her co-morbid illnesses, this patient is at least at moderate risk for complications without adequate follow up.  This format is felt to be most appropriate for this patient at this time.  The patient did not have access to video technology/had technical difficulties with video requiring transitioning to audio format only (telephone).  All issues noted in this document were discussed and addressed.  No physical exam could be performed with this format.  Please refer to the patient's chart for her  consent to telehealth for [facility].   Date:  02/18/2019   ID:  [name], DOB 05/13/1961, MRN [id number]  Patient Location: Home Provider Location: Office  PCP:  Shelby Mattocks, PA-C  Cardiologist:  No primary care provider on file.  Electrophysiologist:  None   Evaluation Performed:  Follow-Up Visit  Chief Complaint: Elevated triglycerides  History of Present Illness:    Nichole Wilson is a 58 y.o. female with past medical history of familial hyperlipidemia.  Her triglycerides are markedly elevated.  She tells me that she takes Vascepa 1 g twice daily.  She denies any problems at this time.  She is trying to diet and exercise regularly.  No chest pain orthopnea or PND.  At the time of my evaluation, the patient is alert awake oriented and in no distress.  The patient does not have symptoms concerning for COVID-19 infection (fever, chills, cough, or new shortness of breath).    Past Medical History:  Diagnosis Date  . Anemia   . Blood transfusion without reported diagnosis   . Breast cancer ([place]) 2017   Right Breast Cancer  . Breast cyst, left 01-04-12   cyst no longer apparent  . Cholelithiasis with  choledocholithiasis   . Chronic calculus cholecystitis 12/25/2011   Lap chole on 03/25/12   . Complication of anesthesia 2002   PT STATES DURING HER C-SECTION SHE WAS AWARE OF INCISION AND PAIN -THEN WAS QUICKLY "PUT OUT"  . Family history of adverse reaction to anesthesia    father became aggressive  . Gallstone pancreatitis   . Glucose intolerance (impaired glucose tolerance)    since at least 2011.   [name] Hepatic steatosis   . Hx of adenomatous polyp of colon 12/12/2018  . Hypercholesterolemia with hypertriglyceridemia   . Hypertension 01-04-12   med d/c-last taken 12-07-11  -PT [place] STOPPED HER B/P MEDICATION BECAUSE HER B/P TOO LOW AND PT STATES HER B/P'S HAVE BEEN OK SINCE.  [name] Hypertriglyceridemia   . Migraine without aura, without mention of intractable migraine without mention of status migrainosus   . PONV (postoperative nausea and vomiting)    patient states that she did not get sick with mastectomy  . Pre-diabetes    Past Surgical History:  Procedure Laterality Date  . CESAREAN SECTION     2002  . CHOLECYSTECTOMY  03/25/2012   Procedure: LAPAROSCOPIC CHOLECYSTECTOMY WITH INTRAOPERATIVE CHOLANGIOGRAM;  Surgeon: Haywood Lasso, MD;  Location: [facility];  Service: General;  Laterality: N/A;  Laparoscopic Cholecystectomy with Intraoperative Cholangiogram  . COLONOSCOPY    . ERCP  12/05/2011   Procedure: ENDOSCOPIC RETROGRADE CHOLANGIOPANCREATOGRAPHY (ERCP);  Surgeon: Inda Castle, MD;  Location: [facility];  Service: Endoscopy;  Laterality: N/A;  . ERCP  12/08/2011  Procedure: ENDOSCOPIC RETROGRADE CHOLANGIOPANCREATOGRAPHY (ERCP);  Surgeon: Inda Castle, MD;  Location: [place];  Service: Gastroenterology;  Laterality: N/A;  . ESOPHAGOGASTRODUODENOSCOPY (EGD) WITH PROPOFOL N/A 09/28/2015   Procedure: ESOPHAGOGASTRODUODENOSCOPY (EGD) WITH PROPOFOL;  Surgeon: Clarene Essex, MD;  Location: [facility];  Service: Endoscopy;  Laterality: N/A;  H AND P IN DRAWER  . EUS  N/A 10/13/2015   Procedure: UPPER ENDOSCOPIC ULTRASOUND (EUS) RADIAL;  Surgeon: Arta Silence, MD;  Location: [facility];  Service: Endoscopy;  Laterality: N/A;  . LAPAROSCOPIC PARTIAL GASTRECTOMY N/A 01/11/2016   Procedure: LAPAROSCOPIC PARTIAL GASTRECTOMY;  Surgeon: [name], MD;  Location: [place];  Service: General;  Laterality: N/A;  . MASTECTOMY Right 09/2015  . MASTECTOMY W/ SENTINEL NODE BIOPSY Right 09/15/2015   Procedure: RIGHT MASTECTOMY WITH SENTINEL LYMPH NODE BIOPSY;  Surgeon: Rolm Bookbinder, MD;  Location: [place];  Service: General;  Laterality: Right;  . UPPER GI ENDOSCOPY N/A 01/11/2016   Procedure: UPPER GI ENDOSCOPY;  Surgeon: [name], MD;  Location: [place];  Service: General;  Laterality: N/A;     Current Meds  Medication Sig  . Cholecalciferol (VITAMIN D3) 10000 units capsule Take 10,000 Units by mouth daily.  . pantoprazole (PROTONIX) 40 MG tablet Take 1 tablet (40 mg total) by mouth daily before breakfast.  . RESTASIS 0.05 % ophthalmic emulsion   . VASCEPA 1 g CAPS TAKE 2 CAPSULES BY MOUTH TWICE DAILY  . vitamin B-12 (CYANOCOBALAMIN) 1000 MCG tablet Take 1,000 mcg by mouth daily.     Allergies:   Lisinopril and Other   Social History   Tobacco Use  . Smoking status: Never Smoker  . Smokeless tobacco: Never Used  Substance Use Topics  . Alcohol use: No  . Drug use: No     Family Hx: The patient's family history includes Diabetes in her brother, father, and mother; Heart disease in her paternal uncle; Liver disease in her brother. There is no history of Colon cancer, Esophageal cancer, Rectal cancer, or Stomach cancer.  ROS:   Please see the history of present illness.    As mentioned above  All other systems reviewed and are negative.   Prior CV studies:   The following studies were reviewed today:  None significant  Labs/Other Tests and Data Reviewed:    EKG:  No ECG reviewed.  Recent Labs: 11/06/2018: ALT 25; BUN 10; Creatinine, Ser  0.53; Potassium 4.3; Sodium 142; TSH 1.370 12/12/2018: Hemoglobin 13.0; Platelet Count 256   Recent Lipid Panel Lab Results  Component Value Date/Time   CHOL 264 (H) 12/12/2018 12:00 AM   TRIG 583 (HH) 12/12/2018 12:00 AM   HDL 43 12/12/2018 12:00 AM   CHOLHDL 6.1 (H) 12/12/2018 12:00 AM   CHOLHDL 5 05/15/2018 09:38 AM   LDLCALC Comment 12/12/2018 12:00 AM   LDLDIRECT 132.0 05/15/2018 09:38 AM    Wt Readings from Last 3 Encounters:  02/18/19 148 lb (67.1 kg)  12/26/18 158 lb (71.7 kg)  12/19/18 155 lb (70.3 kg)     Objective:    Vital Signs:  BP (!) 184/66   Pulse 64   Ht 5\' 2"  (1.575 m)   Wt 148 lb (67.1 kg)   LMP 11/29/2011   BMI 27.07 kg/m    VITAL SIGNS:  reviewed  ASSESSMENT & PLAN:    1. Hypertriglyceridemia family: I discussed my findings with the patient at extensive length.  Diet was emphasized.  I told her to take 2 tablets of Cipro 2 g twice daily and  she vocalized understanding.  Importance of regular exercise stressed.  I would like her to come back in 2 months for a liver lipid check fasting and she will be seen in follow-up appointment in 4 months or earlier if she has any concerns.  She had multiple questions which were answered to her satisfaction.  COVID-19 Education: The signs and symptoms of COVID-19 were discussed with the patient and how to seek care for testing (follow up with PCP or arrange E-visit).  The importance of social distancing was discussed today.  Time:   Today, I have spent 12 minutes with the patient with telehealth technology discussing the above problems.     Medication Adjustments/Labs and Tests Ordered: Current medicines are reviewed at length with the patient today.  Concerns regarding medicines are outlined above.   Tests Ordered: No orders of the defined types were placed in this encounter.   Medication Changes: No orders of the defined types were placed in this encounter.   Follow Up:  Virtual Visit or In Person 4  months  Signed, Jenean Lindau, MD  02/18/2019 3:52 PM    [facility]

## 2019-02-20 ENCOUNTER — Other Ambulatory Visit: Payer: Self-pay | Admitting: Internal Medicine

## 2019-02-20 DIAGNOSIS — E782 Mixed hyperlipidemia: Secondary | ICD-10-CM

## 2019-02-20 MED FILL — VASCEPA 1 GM CAPSULE: 1 | 90 days supply | Qty: 360 | Fill #0

## 2019-02-27 ENCOUNTER — Other Ambulatory Visit (HOSPITAL_COMMUNITY)
Admission: RE | Admit: 2019-02-27 | Discharge: 2019-02-27 | Disposition: A | Discharge status: Home / Self Care | Payer: No Typology Code available for payment source | Source: Ambulatory Visit | Attending: Internal Medicine | Admitting: Internal Medicine

## 2019-02-27 ENCOUNTER — Other Ambulatory Visit: Payer: Self-pay

## 2019-02-27 ENCOUNTER — Ambulatory Visit (INDEPENDENT_AMBULATORY_CARE_PROVIDER_SITE_OTHER): Payer: No Typology Code available for payment source | Admitting: Internal Medicine

## 2019-02-27 ENCOUNTER — Encounter: Payer: Self-pay | Admitting: Internal Medicine

## 2019-02-27 VITALS — BP 140/88 | HR 98 | Temp 98.2°F | Ht 62.0 in | Wt 155.6 lb

## 2019-02-27 DIAGNOSIS — Z0000 Encounter for general adult medical examination without abnormal findings: Secondary | ICD-10-CM

## 2019-02-27 DIAGNOSIS — E781 Pure hyperglyceridemia: Secondary | ICD-10-CM | POA: Diagnosis not present

## 2019-02-27 DIAGNOSIS — M79642 Pain in left hand: Secondary | ICD-10-CM | POA: Diagnosis not present

## 2019-02-27 DIAGNOSIS — E1121 Type 2 diabetes mellitus with diabetic nephropathy: Secondary | ICD-10-CM

## 2019-02-27 DIAGNOSIS — Z0001 Encounter for general adult medical examination with abnormal findings: Secondary | ICD-10-CM

## 2019-02-27 DIAGNOSIS — Z124 Encounter for screening for malignant neoplasm of cervix: Secondary | ICD-10-CM | POA: Insufficient documentation

## 2019-02-27 DIAGNOSIS — R102 Pelvic and perineal pain: Secondary | ICD-10-CM | POA: Diagnosis not present

## 2019-02-27 DIAGNOSIS — Z1211 Encounter for screening for malignant neoplasm of colon: Secondary | ICD-10-CM

## 2019-02-27 DIAGNOSIS — I10 Essential (primary) hypertension: Secondary | ICD-10-CM | POA: Diagnosis not present

## 2019-02-27 DIAGNOSIS — E782 Mixed hyperlipidemia: Secondary | ICD-10-CM

## 2019-02-27 LAB — POCT URINALYSIS DIPSTICK
Bilirubin, UA: NEGATIVE
Blood, UA: NEGATIVE
Glucose, UA: NEGATIVE
Ketones, UA: NEGATIVE
Leukocytes, UA: NEGATIVE
Nitrite, UA: NEGATIVE
Protein, UA: NEGATIVE
Spec Grav, UA: 1.01 (ref 1.010–1.025)
Urobilinogen, UA: 0.2 E.U./dL
pH, UA: 7 (ref 5.0–8.0)

## 2019-02-27 LAB — POCT UA - MICROALBUMIN
Albumin/Creatinine Ratio, Urine, POC: <30
Creatinine, POC: 10 mg/dL
Microalbumin Ur, POC: 10 mg/L

## 2019-02-27 LAB — POC HEMOCCULT BLD/STL (OFFICE/1-CARD/DIAGNOSTIC): Fecal Occult Blood, POC: NEGATIVE

## 2019-02-27 MED ORDER — VASCEPA 1 G PO CAPS: 2.0000 | ORAL_CAPSULE | Freq: Two times a day (BID) | ORAL | 1 refills | Status: DC

## 2019-02-27 NOTE — Progress Notes (Addendum)
Subjective:     Patient ID: Nichole Wilson , female    DOB: 1960-11-30 , 58 y.o.   MRN: [id number]   Chief Complaint  Patient presents with  . Annual Exam    HPI  Here for physical and will have C-pap titration next week as second visit.  Had cardiology visit and told to get on 2 vascepa's bid BP at home are around  130/90, or 140/90 Has been monopausal x 10 days, gets mild hot flushes and seems a little worse since she increased the vascepa.  Last eye xm 2 weeks ago, dental check up 8/18 Past Medical History:  Diagnosis Date  . Anemia   . Blood transfusion without reported diagnosis   . Breast cancer ([place]) 2017   Right Breast Cancer  . Breast cyst, left 01-04-12   cyst no longer apparent  . Cholelithiasis with choledocholithiasis   . Chronic calculus cholecystitis 12/25/2011   Lap chole on 03/25/12   . Complication of anesthesia 2002   PT STATES DURING HER C-SECTION SHE WAS AWARE OF INCISION AND PAIN -THEN WAS QUICKLY "PUT OUT"  . Family history of adverse reaction to anesthesia    father became aggressive  . Gallstone pancreatitis   . Glucose intolerance (impaired glucose tolerance)    since at least 2011.   [name] Hepatic steatosis   . Hx of adenomatous polyp of colon 12/12/2018  . Hypercholesterolemia with hypertriglyceridemia   . Hypertension 01-04-12   med d/c-last taken 12-07-11  -PT [place] STOPPED HER B/P MEDICATION BECAUSE HER B/P TOO LOW AND PT STATES HER B/P'S HAVE BEEN OK SINCE.  [name] Hypertriglyceridemia   . Migraine without aura, without mention of intractable migraine without mention of status migrainosus   . PONV (postoperative nausea and vomiting)    patient states that she did not get sick with mastectomy  . Pre-diabetes      Family History  Problem Relation Age of Onset  . Diabetes Mother   . Diabetes Father   . Diabetes Brother   . Heart disease Paternal Uncle   . Liver disease Brother   . Colon cancer Neg Hx   . Esophageal cancer Neg Hx    . Rectal cancer Neg Hx   . Stomach cancer Neg Hx      Current Outpatient Medications:  .  Cholecalciferol (VITAMIN D3) 10000 units capsule, Take 10,000 Units by mouth daily., Disp: , Rfl:  .  VASCEPA 1 g CAPS, TAKE 2 CAPSULES BY MOUTH TWICE DAILY, Disp: 360 capsule, Rfl: 1 .  pantoprazole (PROTONIX) 40 MG tablet, Take 1 tablet (40 mg total) by mouth daily before breakfast. (Patient not taking: Reported on 02/27/2019), Disp: 90 tablet, Rfl: 3 .  RESTASIS 0.05 % ophthalmic emulsion, , Disp: , Rfl: 2 .  vitamin B-12 (CYANOCOBALAMIN) 1000 MCG tablet, Take 1,000 mcg by mouth daily., Disp: , Rfl:    Allergies  Allergen Reactions  . Lisinopril Swelling  . Other     General anesthesia-severe vomiting     Review of Systems  Constitutional: Negative for chills, diaphoresis and fever.  HENT:       Sometimes bites her tongue and wakes with both jaw pain and has popping.   Respiratory: Negative.   Cardiovascular: Negative.        Elevated BP off and on  Gastrointestinal: Positive for abdominal pain. Negative for abdominal distention, anal bleeding, blood in stool, constipation, diarrhea, nausea, rectal pain and vomiting.       Sudden  onset of lower abdominal pain and resolved with in 10 min while standing cooking.   Endocrine:       Mild hot flush since increase of vascepa  Genitourinary: Negative for dysuria, pelvic pain, urgency and vaginal discharge.  Musculoskeletal: Positive for neck pain. Negative for arthralgias.       Off and on on R side  Skin: Negative for rash.  Neurological: Positive for light-headedness and numbness. Negative for dizziness and headaches.       L forearm pain x 1 month with numbness, denies weakness. Has chronic L hand pain x 2 years  On occasion gets light headed and related to her sinus when she gets pressure  Hematological: Negative for adenopathy. Bruises/bleeds easily.       She hit a door knob on her R inner arm on Sunday and is getting better      Today's Vitals   02/27/19 1019  BP: (!) 140/98  Pulse: 98  Temp: 98.2 F (36.8 C)  SpO2: 96%  Weight: 155 lb 9.6 oz (70.6 kg)  Height: 5\' 2"  (1.575 m)   Body mass index is 28.46 kg/m.   Objective:  Physical Exam  Wt is stable BP 140/88 (BP Location: Left Arm, Patient Position: Sitting, Cuff Size: Normal)   Pulse 98   Temp 98.2 F (36.8 C)   Ht 5\' 2"  (1.575 m)   Wt 155 lb 9.6 oz (70.6 kg)   LMP 11/29/2011   SpO2 96%   BMI 28.46 kg/m   General Appearance:    Alert, cooperative, no distress, appears stated age  Head:    Normocephalic, without obvious abnormality, atraumatic  Eyes:    PERRL, conjunctiva/corneas clear, EOM's intact, fundi    benign, both eyes  Ears:    Normal TM's and external ear canals, both ears  Nose:   Nares normal, septum midline, mucosa normal, no drainage    or sinus tenderness  Throat:   Lips, mucosa, and tongue normal; teeth and gums normal  Neck:   Supple, symmetrical, trachea midline, no adenopathy;    thyroid:  no enlargement/tenderness/nodules; no carotid   bruit  Back:     Symmetric, no curvature, ROM normal, no CVA tenderness  Lungs:     Clear to auscultation bilaterally, respirations unlabored  Chest Wall:    No tenderness or deformity   Heart:    Regular rate and rhythm, S1 and S2 normal, no murmur, rub   or gallop  Breast Exam:    No tenderness, masses, or nipple abnormality  Abdomen:     Protruded, Soft,bowel sounds active all four quadrants,    no masses, no organomegaly. Has mild tenderness on lower quadrants and suprapubic as well.   Genitalia:    Normal female without lesion, discharge. Has tenderness with movement of her cervix. Difficult to assess adnexal masses or uterine enlargement due to obese abdomen.   Rectal:    Normal tone, no masses or tenderness;  guaiac negative stool  Extremities:   Extremities normal, atraumatic, no cyanosis or edema. L hand with fullness on palpation of  area below index and middle finger which  is a little tender to palpation.   Pulses:   2+ and symmetric all extremities  Skin:   Skin color, texture, turgor normal, no rashes or lesions  Lymph nodes:   Cervical, supraclavicular, and axillary nodes normal  Neurologic:   CNII-XII intact, normal strength, sensation and reflexes    Throughout. Normal Romberg, tandem gait, tip toe and heel  gait.      UA- neg EKG-  normal Assessment And Plan:     1. Pure hyperglyceridemia- chronic - Icosapent Ethyl (VASCEPA) 1 g CAPS; Take 2 capsules (2 g total) by mouth 2 (two) times daily. Started 02/17/2019  Dispense: 360 capsule; Refill: 1    2. Left hand pain- chronic, but never has seen a specialist for this.  - Ambulatory referral to Hand Surgery  3. Controlled type 2 diabetes mellitus with diabetic nephropathy, without long-term current use of insulin ([place])- chronic. Fu 3 months. May continue current med.  - Hemoglobin A1c - POCT Urinalysis Dipstick (81002) - POCT UA - Microalbumin  4. Essential hypertension- not with optimal control right now. She will continue to monitor this.  - CMP14 + Anion Gap - CBC no Diff - POCT Urinalysis Dipstick (81002) - POCT UA - Microalbumin  5. Pelvic pain- acute - US Pelvic Complete With Transvaginal; Future  6. Encounter for general adult medical examination with abnormal findings    Routine. FU 1 y - POCT Urinalysis Dipstick (81002)- neg.  - POCT UA - Microalbumin- nl  7. Screening for cervical cancer- screen - Cytology -Pap Smear  9. Screen for colon cancer- screen - POC Hemoccult Bld/Stl (1-Cd Office Dx)- neg.    Nichole Bordeau [name], PA-C    THE PATIENT IS ENCOURAGED TO PRACTICE SOCIAL DISTANCING DUE TO THE COVID-19 PANDEMIC.

## 2019-02-27 NOTE — Patient Instructions (Signed)

## 2019-02-28 ENCOUNTER — Other Ambulatory Visit (HOSPITAL_COMMUNITY)
Admission: RE | Admit: 2019-02-28 | Discharge: 2019-02-28 | Disposition: A | Discharge status: Home / Self Care | Payer: No Typology Code available for payment source | Source: Ambulatory Visit | Attending: Neurology | Admitting: Neurology

## 2019-02-28 DIAGNOSIS — Z20828 Contact with and (suspected) exposure to other viral communicable diseases: Secondary | ICD-10-CM | POA: Diagnosis not present

## 2019-02-28 DIAGNOSIS — Z01812 Encounter for preprocedural laboratory examination: Secondary | ICD-10-CM | POA: Insufficient documentation

## 2019-02-28 LAB — CMP14 + ANION GAP
ALT: 37 IU/L — ABNORMAL HIGH (ref 0–32)
AST: 28 IU/L (ref 0–40)
Albumin/Globulin Ratio: 1.5 (ref 1.2–2.2)
Albumin: 4.4 g/dL (ref 3.8–4.9)
Alkaline Phosphatase: 159 IU/L — ABNORMAL HIGH (ref 39–117)
Anion Gap: 14 mmol/L (ref 10.0–18.0)
BUN/Creatinine Ratio: 16 (ref 9–23)
BUN: 9 mg/dL (ref 6–24)
Bilirubin Total: 0.6 mg/dL (ref 0.0–1.2)
CO2: 25 mmol/L (ref 20–29)
Calcium: 9.6 mg/dL (ref 8.7–10.2)
Chloride: 101 mmol/L (ref 96–106)
Creatinine, Ser: 0.58 mg/dL (ref 0.57–1.00)
GFR calc Af Amer: 119 mL/min/{1.73_m2} (ref >59)
GFR calc non Af Amer: 103 mL/min/{1.73_m2} (ref >59)
Globulin, Total: 3 g/dL (ref 1.5–4.5)
Glucose: 129 mg/dL — ABNORMAL HIGH (ref 65–99)
Potassium: 4.2 mmol/L (ref 3.5–5.2)
Sodium: 140 mmol/L (ref 134–144)
Total Protein: 7.4 g/dL (ref 6.0–8.5)

## 2019-02-28 LAB — CBC
Hematocrit: 39 % (ref 34.0–46.6)
Hemoglobin: 13.3 g/dL (ref 11.1–15.9)
MCH: 32 pg (ref 26.6–33.0)
MCHC: 34.1 g/dL (ref 31.5–35.7)
MCV: 94 fL (ref 79–97)
Platelets: 297 10*3/uL (ref 150–450)
RBC: 4.16 x10E6/uL (ref 3.77–5.28)
RDW: 13 % (ref 11.7–15.4)
WBC: 5.8 10*3/uL (ref 3.4–10.8)

## 2019-02-28 LAB — SARS CORONAVIRUS 2 (TAT 6-24 HRS): SARS Coronavirus 2: NEGATIVE

## 2019-02-28 LAB — HEMOGLOBIN A1C
Est. average glucose Bld gHb Est-mCnc: 128 mg/dL
Hgb A1c MFr Bld: 6.1 % — ABNORMAL HIGH (ref 4.8–5.6)

## 2019-03-03 ENCOUNTER — Ambulatory Visit (INDEPENDENT_AMBULATORY_CARE_PROVIDER_SITE_OTHER): Payer: No Typology Code available for payment source | Admitting: Neurology

## 2019-03-03 DIAGNOSIS — R600 Localized edema: Secondary | ICD-10-CM

## 2019-03-03 DIAGNOSIS — E663 Overweight: Secondary | ICD-10-CM

## 2019-03-03 DIAGNOSIS — G4720 Circadian rhythm sleep disorder, unspecified type: Secondary | ICD-10-CM

## 2019-03-03 DIAGNOSIS — G478 Other sleep disorders: Secondary | ICD-10-CM

## 2019-03-03 DIAGNOSIS — G4733 Obstructive sleep apnea (adult) (pediatric): Secondary | ICD-10-CM | POA: Diagnosis not present

## 2019-03-03 DIAGNOSIS — G4719 Other hypersomnia: Secondary | ICD-10-CM

## 2019-03-04 LAB — CYTOLOGY - PAP: Diagnosis: NEGATIVE

## 2019-03-10 ENCOUNTER — Telehealth: Payer: Self-pay

## 2019-03-10 NOTE — Telephone Encounter (Signed)
I called pt. I advised pt that Dr. Rexene Alberts reviewed their sleep study results and found that pt did well with the cpap during her sleep study. Dr. Rexene Alberts recommends that pt start a cpap at home. I reviewed PAP compliance expectations with the pt. Pt is agreeable to starting a CPAP. I advised pt that an order will be sent to a DME, [facility], and [facility] will call the pt within about one week after they file with the pt's insurance. [facility] will show the pt how to use the machine, fit for masks, and troubleshoot the CPAP if needed. A follow up appt was made for insurance purposes with Amy, NP on 05/12/19 at 3:00pm. Pt verbalized understanding to arrive 15 minutes early and bring their CPAP. A letter with all of this information in it will be mailed to the pt as a reminder. I verified with the pt that the address we have on file is correct. Pt verbalized understanding of results. Pt had no questions at this time but was encouraged to call back if questions arise. I have sent the order to [facility] and have received confirmation that they have received the order.

## 2019-03-10 NOTE — Procedures (Signed)
PATIENT'S NAME:  Nichole Wilson, Nichole Wilson DOB:      1961-06-26      MR#:    [id number]     DATE OF RECORDING: 03/03/2019 REFERRING M.D.:  [name] FNP Study Performed:   CPAP  Titration HISTORY: 58 year old woman with a history of hypertension, hyperlipidemia, hepatic steatosis, glucose intolerance, history of gallstone pancreatitis, prediabetes, breast cancer with status post right mastectomy in 2017, history of lung nodules, iron deficiency and anemia, and overweight state, who presents for a full night PAP titration study. Her baseline PSG on 01/29/19 showed moderate to severe obstructive sleep apnea, with a total AHI of 23.7/hour, REM AHI of 56.6/hour, supine AHI of 27.3/hour and O2 nadir of 78%. The patient endorsed the Epworth Sleepiness Scale at 5/24 points. The patient's weight 158 pounds with a height of 62 (inches), resulting in a BMI of 29.2 kg/m2. The patient's neck circumference measured 15.25 inches.  CURRENT MEDICATIONS: Vitamin D3, Protonix, Vascepa,  Vitamin B12   PROCEDURE:  This is a multichannel digital polysomnogram utilizing the SomnoStar 11.2 system.  Electrodes and sensors were applied and monitored per AASM Specifications.   EEG, EOG, Chin and Limb EMG, were sampled at 200 Hz.  ECG, Snore and Nasal Pressure, Thermal Airflow, Respiratory Effort, CPAP Flow and Pressure, Oximetry was sampled at 50 Hz. Digital video and audio were recorded.      The patient was fitted with a medium Simplus FFM. CPAP was initiated at 5 cmH20 with heated humidity per AASM standards and pressure was advanced to 7cmH20 because of hypopneas, apneas and desaturations.  At a PAP pressure of 9 cmH20, there was a reduction of the AHI to 0.4 with supine REM sleep achieved and O2 nadir of 93%.   Lights Out was at 22:07 and Lights On at 05:00. Total recording time (TRT) was 414 minutes, with a total sleep time (TST) of 340 minutes. The patient's sleep latency was 40.5 minutes, which is delayed. REM latency was 96  minutes. The sleep efficiency was 82.1 %.    SLEEP ARCHITECTURE: WASO (Wake after sleep onset) was 34.5 minutes, which mild sleep fragmentation noted. There were 7.5 minutes in Stage N1, 223.5 minutes Stage N2, 50 minutes Stage N3 and 59 minutes in Stage REM.  The percentage of Stage N1 was 2.2%, Stage N2 was 65.7%, which is increased, Stage N3 was 14.7% and Stage R (REM sleep) was 17.4%, which is mildly reduced. The arousals were noted as: 56 were spontaneous, 0 were associated with PLMs, 3 were associated with respiratory events.  RESPIRATORY ANALYSIS:  There was a total of 19 respiratory events: 3 obstructive apneas, 0 central apneas and 0 mixed apneas with a total of 3 apneas and an apnea index (AI) of .5 /hour. There were 16 hypopneas with a hypopnea index of 2.8/hour. The patient also had 0 respiratory event related arousals (RERAs).      The total APNEA/HYPOPNEA INDEX  (AHI) was 3.4 /hour and the total RESPIRATORY DISTURBANCE INDEX was 3.4 /hour  3 events occurred in REM sleep and 16 events in NREM. The REM AHI was 3.1 /hour versus a non-REM AHI of 3.4 /hour.  The patient spent 336 minutes of total sleep time in the supine position and 4 minutes in non-supine. The supine AHI was 3.4, versus a non-supine AHI of 0.0.  OXYGEN SATURATION & C02:  The baseline 02 saturation was 95%, with the lowest being 91%. Time spent below 89% saturation equaled 0 minutes.  PERIODIC LIMB MOVEMENTS:  The patient  had a total of 0 Periodic Limb Movements. The Periodic Limb Movement (PLM) index was 0 and the PLM Arousal index was 0 /hour.  Audio and video analysis did not show any abnormal or unusual movements, behaviors, phonations or vocalizations. The patient took no bathroom breaks. The EKG was in keeping with normal sinus rhythm (NSR). Post-study, the patient indicated that sleep was worse than usual.   IMPRESSION:  1. Obstructive Sleep Apnea (OSA) 2. Dysfunctions associated with sleep stages or arousal from  sleep   RECOMMENDATIONS:  1. This study demonstrates resolution of the patient's obstructive sleep apnea with CPAP therapy. I will, therefore, start the patient on home CPAP treatment at a pressure of 9 cm via medium FFM with heated humidity. The patient should be reminded to be fully compliant with PAP therapy to improve sleep related symptoms and decrease long term cardiovascular risks. The patient should be reminded, that it may take up to 3 months to get fully used to using PAP with all planned sleep. The earlier full compliance is achieved, the better long term compliance tends to be. Please note that untreated obstructive sleep apnea may carry additional perioperative morbidity. Patients with significant obstructive sleep apnea should receive perioperative PAP therapy and the surgeons and particularly the anesthesiologist should be informed of the diagnosis and the severity of the sleep disordered breathing. 2. This study shows sleep fragmentation and abnormal sleep stage percentages; these are nonspecific findings and per se do not signify an intrinsic sleep disorder or a cause for the patient's sleep-related symptoms. Causes include (but are not limited to) the first night effect of the sleep study, circadian rhythm disturbances, medication effect or an underlying mood disorder or medical problem.  3. The patient should be cautioned not to drive, work at heights, or operate dangerous or heavy equipment when tired or sleepy. Review and reiteration of good sleep hygiene measures should be pursued with any patient. 4. The patient will be seen in follow-up in the sleep clinic at [facility] [place] for discussion of the test results, symptom and treatment compliance review, further management strategies, etc. The referring provider will be notified of the test results.   I certify that I have reviewed the entire raw data recording prior to the issuance of this report in accordance with the Standards of Accreditation of  the American Academy of Sleep Medicine (AASM).    Star Age, MD, PhD Diplomat, American Board of Neurology and Sleep Medicine (Neurology and Sleep Medicine)

## 2019-03-10 NOTE — Progress Notes (Signed)
Patient referred by [name], NP, seen by me on 12/26/18, diagnostic PSG on 01/29/19. Patient had a CPAP titration study on 03/03/19.  Please call and inform patient that I have entered an order for treatment with positive airway pressure (PAP) treatment for obstructive sleep apnea (OSA). She did well during the latest sleep study with CPAP. We will, therefore, arrange for a machine for home use through a DME (durable medical equipment) company of Her choice; and I will see the patient back in follow-up in about 10 weeks. Please also explain to the patient that I will be looking out for compliance data, which can be downloaded from the machine (stored on an SD card, that is inserted in the machine) or via remote access through a modem, that is built into the machine. At the time of the followup appointment we will discuss sleep study results and how it is going with PAP treatment at home. Please advise patient to bring Her machine at the time of the first FU visit, even though this is cumbersome. Bringing the machine for every visit after that will likely not be needed, but often helps for the first visit to troubleshoot if needed. Please re-enforce the importance of compliance with treatment and the need for [place] to monitor compliance data - often an insurance requirement and actually good feedback for the patient as far as how they are doing.  Also remind patient, that any interim PAP machine or mask issues should be first addressed with the DME company, as they can often help better with technical and mask fit issues. Please ask if patient has a preference regarding DME company.  Please also make sure, the patient has a follow-up appointment with me in about 10 weeks from the setup date, thanks. May see one of our nurse practitioners if needed for proper timing of the FU appointment.  Please fax or rout report to the referring provider. Thanks,   Star Age, MD, PhD [facility])

## 2019-03-10 NOTE — Telephone Encounter (Signed)
-----   Message from Star Age, MD sent at 03/10/2019  8:38 AM EDT ----- Patient referred by [name], NP, seen by me on 12/26/18, diagnostic PSG on 01/29/19. Patient had a CPAP titration study on 03/03/19.  Please call and inform patient that I have entered an order for treatment with positive airway pressure (PAP) treatment for obstructive sleep apnea (OSA). She did well during the latest sleep study with CPAP. We will, therefore, arrange for a machine for home use through a DME (durable medical equipment) company of Her choice; and I will see the patient back in follow-up in about 10 weeks. Please also explain to the patient that I will be looking out for compliance data, which can be downloaded from the machine (stored on an SD card, that is inserted in the machine) or via remote access through a modem, that is built into the machine. At the time of the followup appointment we will discuss sleep study results and how it is going with PAP treatment at home. Please advise patient to bring Her machine at the time of the first FU visit, even though this is cumbersome. Bringing the machine for every visit after that will likely not be needed, but often helps for the first visit to troubleshoot if needed. Please re-enforce the importance of compliance with treatment and the need for [place] to monitor compliance data - often an insurance requirement and actually good feedback for the patient as far as how they are doing.  Also remind patient, that any interim PAP machine or mask issues should be first addressed with the DME company, as they can often help better with technical and mask fit issues. Please ask if patient has a preference regarding DME company.  Please also make sure, the patient has a follow-up appointment with me in about 10 weeks from the setup date, thanks. May see one of our nurse practitioners if needed for proper timing of the FU appointment.  Please fax or rout report to the referring provider.  Thanks,   Star Age, MD, PhD [facility])

## 2019-03-10 NOTE — Addendum Note (Signed)
Addended by: Star Age on: 03/10/2019 08:38 AM   Modules accepted: Orders

## 2019-03-12 ENCOUNTER — Ambulatory Visit
Admission: RE | Admit: 2019-03-12 | Discharge: 2019-03-12 | Disposition: A | Discharge status: Home / Self Care | Payer: No Typology Code available for payment source | Source: Ambulatory Visit | Attending: Internal Medicine | Admitting: Internal Medicine

## 2019-03-12 DIAGNOSIS — R102 Pelvic and perineal pain: Secondary | ICD-10-CM

## 2019-03-13 NOTE — Telephone Encounter (Signed)
I received notice that [facility] is not in network with pt's insurance. [facility] is in network. Will switch her to them.  I called pt and explained this to her and she is agreeable to this plan. Pt verbalized understanding of recommendations.

## 2019-04-07 NOTE — Telephone Encounter (Signed)
Received this notice from [facility]: "My RT team has made several attempts to reach this pt by phone and also sent an email regarding getting an appt made for CPAP set up. They have yet to hear back from her.   If you all are able to speak with her, please let her know that she can call them directly at [phone] ext [phone] to schedule."  I called pt, she has not received any calls from [facility]. They should call her cell phone. Pt will call them as well.

## 2019-04-17 ENCOUNTER — Encounter: Payer: Self-pay | Admitting: Internal Medicine

## 2019-05-01 MED FILL — RESTASIS 0.05% EYE EMULSION: 0.05 | 90 days supply | Qty: 180 | Fill #0

## 2019-05-12 ENCOUNTER — Ambulatory Visit: Payer: Self-pay | Admitting: Family Medicine

## 2019-06-05 ENCOUNTER — Ambulatory Visit (INDEPENDENT_AMBULATORY_CARE_PROVIDER_SITE_OTHER): Payer: No Typology Code available for payment source | Admitting: Internal Medicine

## 2019-06-05 ENCOUNTER — Encounter: Payer: Self-pay | Admitting: Internal Medicine

## 2019-06-05 ENCOUNTER — Other Ambulatory Visit: Payer: Self-pay

## 2019-06-05 VITALS — BP 138/84 | HR 96 | Temp 98.8°F | Ht 60.0 in | Wt 148.0 lb

## 2019-06-05 DIAGNOSIS — I10 Essential (primary) hypertension: Secondary | ICD-10-CM

## 2019-06-05 DIAGNOSIS — E1121 Type 2 diabetes mellitus with diabetic nephropathy: Secondary | ICD-10-CM | POA: Diagnosis not present

## 2019-06-05 DIAGNOSIS — E663 Overweight: Secondary | ICD-10-CM

## 2019-06-05 DIAGNOSIS — E781 Pure hyperglyceridemia: Secondary | ICD-10-CM

## 2019-06-05 NOTE — Progress Notes (Signed)
Subjective:     Patient ID: Nichole Wilson , female    DOB: 06-26-1961 , 58 y.o.   MRN: [id number]   Chief Complaint  Patient presents with  . Diabetes    HPI Pt is here for DM FU. States she has not been checking her glucose any more. She started back on her juicing diet.     Past Medical History:  Diagnosis Date  . Anemia   . Blood transfusion without reported diagnosis   . Breast cancer ([place]) 2017   Right Breast Cancer  . Breast cyst, left 01-04-12   cyst no longer apparent  . Cholelithiasis with choledocholithiasis   . Chronic calculus cholecystitis 12/25/2011   Lap chole on 03/25/12   . Complication of anesthesia 2002   PT STATES DURING HER C-SECTION SHE WAS AWARE OF INCISION AND PAIN -THEN WAS QUICKLY "PUT OUT"  . Family history of adverse reaction to anesthesia    father became aggressive  . Gallstone pancreatitis   . Glucose intolerance (impaired glucose tolerance)    since at least 2011.   [name] Hepatic steatosis   . Hx of adenomatous polyp of colon 12/12/2018  . Hypercholesterolemia with hypertriglyceridemia   . Hypertension 01-04-12   med d/c-last taken 12-07-11  -PT [place] STOPPED HER B/P MEDICATION BECAUSE HER B/P TOO LOW AND PT STATES HER B/P'S HAVE BEEN OK SINCE.  [name] Hypertriglyceridemia   . Migraine without aura, without mention of intractable migraine without mention of status migrainosus   . PONV (postoperative nausea and vomiting)    patient states that she did not get sick with mastectomy  . Pre-diabetes      Family History  Problem Relation Age of Onset  . Diabetes Mother   . Diabetes Father   . Diabetes Brother   . Heart disease Paternal Uncle   . Liver disease Brother   . Colon cancer Neg Hx   . Esophageal cancer Neg Hx   . Rectal cancer Neg Hx   . Stomach cancer Neg Hx      Current Outpatient Medications:  .  Cholecalciferol (VITAMIN D3) 10000 units capsule, Take 10,000 Units by mouth daily., Disp: , Rfl:  .  Icosapent Ethyl  (VASCEPA) 1 g CAPS, Take 2 capsules (2 g total) by mouth 2 (two) times daily. Started 02/17/2019, Disp: 360 capsule, Rfl: 1 .  RESTASIS 0.05 % ophthalmic emulsion, , Disp: , Rfl: 2 .  vitamin B-12 (CYANOCOBALAMIN) 1000 MCG tablet, Take 1,000 mcg by mouth daily., Disp: , Rfl:    Allergies  Allergen Reactions  . Lisinopril Swelling  . Other     General anesthesia-severe vomiting        Review of Systems  Constitutional: Negative for diaphoresis and unexpected weight change.  HENT: Negative for tinnitus.   Eyes: Negative for visual disturbance.  Respiratory: Negative for chest tightness and shortness of breath.   Cardiovascular: Negative for chest pain, palpitations and leg swelling.  Gastrointestinal: Negative for constipation, diarrhea and nausea.  Endocrine: Negative for polydipsia, polyphagia and polyuria.  Genitourinary: Negative for dysuria and frequency.  Skin: Negative for rash and wound.  Musculosk- L hand lump is benign and is due to arthritis Neurological: Negative for dizziness, speech difficulty, weakness, numbness and headaches.   Today's Vitals   06/05/19 1032  BP: 138/84  Pulse: 96  Temp: 98.8 F (37.1 C)  TempSrc: Oral  Weight: 148 lb (67.1 kg)  Height: 5' (1.524 m)   Body mass index is 28.9  kg/m.   Objective:  Physical Exam  Wt is down 6 lbs   Constitutional: She is oriented to person, place, and time. She appears well-developed and well-nourished. No distress.  HENT:  Head: Normocephalic and atraumatic.  Right Ear: External ear normal.  Left Ear: External ear normal.  Nose: Nose normal.  Eyes: Conjunctivae are normal. Right eye exhibits no discharge. Left eye exhibits no discharge. No scleral icterus.  Neck: Neck supple. No thyromegaly present.  No carotid bruits bilaterally  Cardiovascular: Normal rate and regular rhythm.  No murmur heard. Pulmonary/Chest: Effort normal and breath sounds normal. No respiratory distress.  Musculoskeletal: Normal  range of motion. She exhibits no edema.  Lymphadenopathy:    She has no cervical adenopathy.  Neurological: She is alert and oriented to person, place, and time.  Skin: Skin is warm and dry. Capillary refill takes less than 2 seconds. No rash noted. She is not diaphoretic.  Psychiatric: She has a normal mood and affect. Her behavior is normal. Judgment and thought content normal.  Nursing note reviewed.  Assessment And Plan:   1. Pure hyperglyceridemia- chronic.       We will call her back with lab results.   2. Controlled type 2 diabetes mellitus with diabetic nephropathy, without long-term current use of insulin ([place])- chronic. FU 3 months.  - Hemoglobin A1c  3. Essential hypertension- stable.  - CMP14 + Anion Gap - CBC no Diff - Lipid Profile  4. Hypertriglyceridemia- stable - TSH  5. Overweight (BMI 25.0-29.9)- improving. Encouraged to continue loosing wt.     May continue same meds. FU in 3 months.  Dolan Xia [name], PA-C    THE PATIENT IS ENCOURAGED TO PRACTICE SOCIAL DISTANCING DUE TO THE COVID-19 PANDEMIC.

## 2019-06-06 LAB — CMP14 + ANION GAP
ALT: 22 IU/L (ref 0–32)
AST: 18 IU/L (ref 0–40)
Albumin/Globulin Ratio: 1.4 (ref 1.2–2.2)
Albumin: 4.4 g/dL (ref 3.8–4.9)
Alkaline Phosphatase: 188 IU/L — ABNORMAL HIGH (ref 39–117)
Anion Gap: 13 mmol/L (ref 10.0–18.0)
BUN/Creatinine Ratio: 18 (ref 9–23)
BUN: 9 mg/dL (ref 6–24)
Bilirubin Total: 0.6 mg/dL (ref 0.0–1.2)
CO2: 26 mmol/L (ref 20–29)
Calcium: 9.5 mg/dL (ref 8.7–10.2)
Chloride: 99 mmol/L (ref 96–106)
Creatinine, Ser: 0.51 mg/dL — ABNORMAL LOW (ref 0.57–1.00)
GFR calc Af Amer: 124 mL/min/{1.73_m2} (ref >59)
GFR calc non Af Amer: 108 mL/min/{1.73_m2} (ref >59)
Globulin, Total: 3.1 g/dL (ref 1.5–4.5)
Glucose: 117 mg/dL — ABNORMAL HIGH (ref 65–99)
Potassium: 4.3 mmol/L (ref 3.5–5.2)
Sodium: 138 mmol/L (ref 134–144)
Total Protein: 7.5 g/dL (ref 6.0–8.5)

## 2019-06-06 LAB — LIPID PANEL
Chol/HDL Ratio: 4 ratio (ref 0.0–4.4)
Cholesterol, Total: 209 mg/dL — ABNORMAL HIGH (ref 100–199)
HDL: 52 mg/dL (ref >39)
LDL Chol Calc (NIH): 104 mg/dL — ABNORMAL HIGH (ref 0–99)
Triglycerides: 313 mg/dL — ABNORMAL HIGH (ref 0–149)
VLDL Cholesterol Cal: 53 mg/dL — ABNORMAL HIGH (ref 5–40)

## 2019-06-06 LAB — HEMOGLOBIN A1C
Est. average glucose Bld gHb Est-mCnc: 140 mg/dL
Hgb A1c MFr Bld: 6.5 % — ABNORMAL HIGH (ref 4.8–5.6)

## 2019-06-06 LAB — CBC
Hematocrit: 40.7 % (ref 34.0–46.6)
Hemoglobin: 13.9 g/dL (ref 11.1–15.9)
MCH: 32 pg (ref 26.6–33.0)
MCHC: 34.2 g/dL (ref 31.5–35.7)
MCV: 94 fL (ref 79–97)
Platelets: 294 10*3/uL (ref 150–450)
RBC: 4.35 x10E6/uL (ref 3.77–5.28)
RDW: 12.5 % (ref 11.7–15.4)
WBC: 5.6 10*3/uL (ref 3.4–10.8)

## 2019-06-06 LAB — TSH: TSH: 0.979 u[IU]/mL (ref 0.450–4.500)

## 2019-06-18 MED FILL — VASCEPA 1 GM CAPSULE: 1 | 90 days supply | Qty: 360 | Fill #1

## 2019-06-19 ENCOUNTER — Telehealth: Payer: Self-pay

## 2019-06-19 NOTE — Telephone Encounter (Signed)
Left message for patient to call office regarding labs.

## 2019-06-19 NOTE — Telephone Encounter (Signed)
-----   Message from Jenean Lindau, MD sent at 06/10/2019 11:14 AM EST ----- I would prefer that she get an appointment with the dietitian to fine-tune her diet so that her lipids especially triglycerides can be under better control.  Please stressed the importance of regular exercise half an hour of walking or bicycling preferably on a stationary bicycle for half an hour at least 5 days a week. ----- Message ----- From: Shelby Mattocks, PA-C Sent: 06/10/2019   8:31 AM EST To: Jenean Lindau, MD  Here is a copy of our mutual patient's lipid panel.

## 2019-07-17 NOTE — Telephone Encounter (Signed)
Left 2nd message for patient to call office concerning consult.

## 2019-08-20 MED FILL — PANTOPRAZOLE SOD DR 40 MG T: 40 | 90 days supply | Qty: 90 | Fill #2

## 2019-09-03 ENCOUNTER — Telehealth: Payer: Self-pay

## 2019-09-03 NOTE — Telephone Encounter (Signed)
ATT TO CONTACT PT TO ADVISE OFC CLOSED 2/18 DUE TO WEATHER NO ANS LVM THAT APPT HAS BEEN RESCHEDULED

## 2019-09-04 ENCOUNTER — Ambulatory Visit: Payer: No Typology Code available for payment source | Admitting: Internal Medicine

## 2019-09-10 ENCOUNTER — Ambulatory Visit (INDEPENDENT_AMBULATORY_CARE_PROVIDER_SITE_OTHER): Payer: No Typology Code available for payment source | Admitting: Internal Medicine

## 2019-09-10 ENCOUNTER — Other Ambulatory Visit: Payer: Self-pay

## 2019-09-10 VITALS — BP 128/86 | HR 91 | Temp 98.1°F | Ht 60.0 in | Wt 149.0 lb

## 2019-09-10 DIAGNOSIS — E118 Type 2 diabetes mellitus with unspecified complications: Secondary | ICD-10-CM

## 2019-09-10 DIAGNOSIS — Z1231 Encounter for screening mammogram for malignant neoplasm of breast: Secondary | ICD-10-CM | POA: Diagnosis not present

## 2019-09-10 DIAGNOSIS — Z853 Personal history of malignant neoplasm of breast: Secondary | ICD-10-CM

## 2019-09-10 DIAGNOSIS — I10 Essential (primary) hypertension: Secondary | ICD-10-CM | POA: Diagnosis not present

## 2019-09-10 NOTE — Progress Notes (Signed)
This visit occurred during the SARS-CoV-2 public health emergency.  Safety protocols were in place, including screening questions prior to the visit, additional usage of staff PPE, and extensive cleaning of exam room while observing appropriate contact time as indicated for disinfecting solutions.  Subjective:     Patient ID: Nichole Wilson , female    DOB: [birth year] , 59 y.o.   MRN: [id number]   Chief Complaint  Patient presents with  . Diabetes    HPI Has lost 3 bls, Has not been checking her glucose at home. Takes  Glucose balance qd which has been helping her. She does not have any complaints today.   Past Medical History:  Diagnosis Date  . Anemia   . Blood transfusion without reported diagnosis   . Breast cancer ([place]) 2017   Right Breast Cancer  . Breast cyst, left 01-04-12   cyst no longer apparent  . Cholelithiasis with choledocholithiasis   . Chronic calculus cholecystitis 12/25/2011   Lap chole on 03/25/12   . Complication of anesthesia 2002   PT STATES DURING HER C-SECTION SHE WAS AWARE OF INCISION AND PAIN -THEN WAS QUICKLY "PUT OUT"  . Family history of adverse reaction to anesthesia    father became aggressive  . Gallstone pancreatitis   . Glucose intolerance (impaired glucose tolerance)    since at least 2011.   [name] Hepatic steatosis   . Hx of adenomatous polyp of colon 12/12/2018  . Hypercholesterolemia with hypertriglyceridemia   . Hypertension 01-04-12   med d/c-last taken 12-07-11  -PT [place] STOPPED HER B/P MEDICATION BECAUSE HER B/P TOO LOW AND PT STATES HER B/P'S HAVE BEEN OK SINCE.  [name] Hypertriglyceridemia   . Migraine without aura, without mention of intractable migraine without mention of status migrainosus   . PONV (postoperative nausea and vomiting)    patient states that she did not get sick with mastectomy  . Pre-diabetes      Family History  Problem Relation Age of Onset  . Diabetes Mother   . Diabetes Father   . Diabetes Brother    . Heart disease Paternal Uncle   . Liver disease Brother   . Colon cancer Neg Hx   . Esophageal cancer Neg Hx   . Rectal cancer Neg Hx   . Stomach cancer Neg Hx      Current Outpatient Medications:  .  Ascorbic Acid (VITAMIN C) 1000 MG tablet, Take 1,000 mg by mouth daily., Disp: , Rfl:  .  Cholecalciferol (VITAMIN D3) 10000 units capsule, Take 10,000 Units by mouth daily., Disp: , Rfl:  .  Icosapent Ethyl (VASCEPA) 1 g CAPS, Take 2 capsules (2 g total) by mouth 2 (two) times daily. Started 02/17/2019, Disp: 360 capsule, Rfl: 1 .  RESTASIS 0.05 % ophthalmic emulsion, , Disp: , Rfl: 2 .  vitamin B-12 (CYANOCOBALAMIN) 1000 MCG tablet, Take 1,000 mcg by mouth daily., Disp: , Rfl:  .  vitamin E 1000 UNIT capsule, Take 1,000 Units by mouth daily., Disp: , Rfl:  .  zinc gluconate 50 MG tablet, Take 50 mg by mouth daily., Disp: , Rfl:    Allergies  Allergen Reactions  . Lisinopril Swelling  . Other     General anesthesia-severe vomiting     Review of Systems   Constitutional: She is oriented to person, place, and time. She appears well-developed and well-nourished. No distress.  HENT:  Head: Normocephalic and atraumatic.  Right Ear: External ear normal.  Left Ear: External ear normal.  Nose: Nose normal.  Eyes: Conjunctivae are normal. Right eye exhibits no discharge. Left eye exhibits no discharge. No scleral icterus.  Neck: Neck supple. No thyromegaly present.  No carotid bruits bilaterally  Cardiovascular: Normal rate and regular rhythm.  No murmur heard. Pulmonary/Chest: Effort normal and breath sounds normal. No respiratory distress.  Musculoskeletal: Normal range of motion. She exhibits no edema.  Lymphadenopathy:    She has no cervical adenopathy.  Neurological: She is alert and oriented to person, place, and time.  Skin: Skin is warm and dry. Capillary refill takes less than 2 seconds. No rash noted. She is not diaphoretic.  Psychiatric: She has a normal mood and affect.  Her behavior is normal. Judgment and thought content normal.  Nursing note reviewed.  Today's Vitals   09/10/19 0955  BP: 128/86  Pulse: 91  Temp: 98.1 F (36.7 C)  TempSrc: Oral  Weight: 149 lb (67.6 kg)  Height: 5' (1.524 m)   Body mass index is 29.1 kg/m.   Objective:  Physical Exam   Constitutional: She is oriented to person, place, and time. She appears well-developed and well-nourished. No distress.  HENT:  Head: Normocephalic and atraumatic.  Right Ear: External ear normal.  Left Ear: External ear normal.  Nose: Nose normal.  Eyes: Conjunctivae are normal. Right eye exhibits no discharge. Left eye exhibits no discharge. No scleral icterus.  Neck: Neck supple. No thyromegaly present.  No carotid bruits bilaterally  Cardiovascular: Normal rate and regular rhythm.  No murmur heard. Pulmonary/Chest: Effort normal and breath sounds normal. No respiratory distress.  Musculoskeletal: Normal range of motion. She exhibits no edema.  Lymphadenopathy:    She has no cervical adenopathy.  Neurological: She is alert and oriented to person, place, and time.  Skin: Skin is warm and dry. Capillary refill takes less than 2 seconds. No rash noted. She is not diaphoretic.  Psychiatric: She has a normal mood and affect. Her behavior is normal. Judgment and thought content normal.  Nursing note reviewed.     Assessment And Plan:     1. Encounter for screening mammogram for malignant neoplasm of breast- has never got it when ordered last year.  - MM Digital Screening; Future  2. Personal history of breast cancer- will continue FU with her oncologits - MM Digital Screening; Future  3. Type 2 diabetes mellitus with complication, without long-term current use of insulin ([place])- diet controled. If AC improved, will continue with what she is doing.  - Hemoglobin A1c  4. Essential hypertension- stable. May continue current medication. Fu in 3 months.  - CBC no Diff - Lipid Profile -  CMP14 + Anion Gap   Montreal Steidle [name], PA-C    THE PATIENT IS ENCOURAGED TO PRACTICE SOCIAL DISTANCING DUE TO THE COVID-19 PANDEMIC.

## 2019-09-11 ENCOUNTER — Ambulatory Visit: Payer: No Typology Code available for payment source | Admitting: Internal Medicine

## 2019-09-11 LAB — CMP14 + ANION GAP
ALT: 18 IU/L (ref 0–32)
AST: 16 IU/L (ref 0–40)
Albumin/Globulin Ratio: 1.6 (ref 1.2–2.2)
Albumin: 4.4 g/dL (ref 3.8–4.9)
Alkaline Phosphatase: 183 IU/L — ABNORMAL HIGH (ref 39–117)
Anion Gap: 16 mmol/L (ref 10.0–18.0)
BUN/Creatinine Ratio: 19 (ref 9–23)
BUN: 11 mg/dL (ref 6–24)
Bilirubin Total: 0.4 mg/dL (ref 0.0–1.2)
CO2: 24 mmol/L (ref 20–29)
Calcium: 9.4 mg/dL (ref 8.7–10.2)
Chloride: 101 mmol/L (ref 96–106)
Creatinine, Ser: 0.57 mg/dL (ref 0.57–1.00)
GFR calc Af Amer: 120 mL/min/{1.73_m2} (ref >59)
GFR calc non Af Amer: 104 mL/min/{1.73_m2} (ref >59)
Globulin, Total: 2.7 g/dL (ref 1.5–4.5)
Glucose: 127 mg/dL — ABNORMAL HIGH (ref 65–99)
Potassium: 4.4 mmol/L (ref 3.5–5.2)
Sodium: 141 mmol/L (ref 134–144)
Total Protein: 7.1 g/dL (ref 6.0–8.5)

## 2019-09-11 LAB — CBC
Hematocrit: 37.5 % (ref 34.0–46.6)
Hemoglobin: 12.9 g/dL (ref 11.1–15.9)
MCH: 31.9 pg (ref 26.6–33.0)
MCHC: 34.4 g/dL (ref 31.5–35.7)
MCV: 93 fL (ref 79–97)
Platelets: 285 10*3/uL (ref 150–450)
RBC: 4.05 x10E6/uL (ref 3.77–5.28)
RDW: 12.3 % (ref 11.7–15.4)
WBC: 5.5 10*3/uL (ref 3.4–10.8)

## 2019-09-11 LAB — LIPID PANEL
Chol/HDL Ratio: 4.8 ratio — ABNORMAL HIGH (ref 0.0–4.4)
Cholesterol, Total: 255 mg/dL — ABNORMAL HIGH (ref 100–199)
HDL: 53 mg/dL (ref >39)
LDL Chol Calc (NIH): 135 mg/dL — ABNORMAL HIGH (ref 0–99)
Triglycerides: 370 mg/dL — ABNORMAL HIGH (ref 0–149)
VLDL Cholesterol Cal: 67 mg/dL — ABNORMAL HIGH (ref 5–40)

## 2019-09-11 LAB — HEMOGLOBIN A1C
Est. average glucose Bld gHb Est-mCnc: 123 mg/dL
Hgb A1c MFr Bld: 5.9 % — ABNORMAL HIGH (ref 4.8–5.6)

## 2019-09-22 ENCOUNTER — Other Ambulatory Visit: Payer: Self-pay

## 2019-09-22 DIAGNOSIS — E782 Mixed hyperlipidemia: Secondary | ICD-10-CM

## 2019-09-22 MED ORDER — ROSUVASTATIN CALCIUM 10 MG PO TABS: 10.0000 mg | ORAL_TABLET | Freq: Every day | ORAL | 3 refills | Status: DC

## 2019-09-22 MED FILL — ROSUVASTATIN CALCIUM 10 MG: 10 | 90 days supply | Qty: 90 | Fill #0

## 2019-09-25 ENCOUNTER — Encounter: Payer: Self-pay | Admitting: Cardiology

## 2019-09-25 ENCOUNTER — Telehealth (INDEPENDENT_AMBULATORY_CARE_PROVIDER_SITE_OTHER): Payer: No Typology Code available for payment source | Admitting: Cardiology

## 2019-09-25 VITALS — BP 134/93 | HR 84 | Wt 149.0 lb

## 2019-09-25 DIAGNOSIS — E782 Mixed hyperlipidemia: Secondary | ICD-10-CM | POA: Diagnosis not present

## 2019-09-25 DIAGNOSIS — Z1329 Encounter for screening for other suspected endocrine disorder: Secondary | ICD-10-CM | POA: Diagnosis not present

## 2019-09-25 DIAGNOSIS — I10 Essential (primary) hypertension: Secondary | ICD-10-CM | POA: Diagnosis not present

## 2019-09-25 DIAGNOSIS — E118 Type 2 diabetes mellitus with unspecified complications: Secondary | ICD-10-CM

## 2019-09-25 MED ORDER — FENOFIBRATE 145 MG PO TABS: 145.0000 mg | ORAL_TABLET | Freq: Every day | ORAL | 3 refills | Status: DC

## 2019-09-25 MED ORDER — ROSUVASTATIN CALCIUM 20 MG PO TABS: 10.0000 mg | ORAL_TABLET | Freq: Every day | ORAL | 3 refills | Status: DC

## 2019-09-25 MED FILL — FENOFIBRATE 145 MG TAB: 145 | 90 days supply | Qty: 90 | Fill #0

## 2019-09-25 NOTE — Progress Notes (Signed)
Virtual Visit via Video Note   This visit type was conducted due to national recommendations for restrictions regarding the COVID-19 Pandemic (e.g. social distancing) in an effort to limit this patient's exposure and mitigate transmission in our community.  Due to her co-morbid illnesses, this patient is at least at moderate risk for complications without adequate follow up.  This format is felt to be most appropriate for this patient at this time.  All issues noted in this document were discussed and addressed.  A limited physical exam was performed with this format.  Please refer to the patient's chart for her consent to telehealth for [facility].   The patient was identified using 2 identifiers.  Date:  09/25/2019   ID:  Nichole Wilson, DOB 06/22/1961, MRN [id number]  Patient Location: Home Provider Location: Office  PCP:  Shelby Mattocks, PA-C  Cardiologist:  No primary care provider on file.  Electrophysiologist:  None   Evaluation Performed:  Follow-Up Visit  Chief Complaint: Elevated triglycerides  History of Present Illness:    Nichole Wilson is a 59 y.o. female with history of hypertriglyceridemia.  She denies any problems at this time and takes care of activities of daily living.  No chest pain orthopnea or PND.  She mentions to me that she is lax with her diet and exercise because of the Covid and holiday season in the past.  At the time of my evaluation, the patient is alert awake oriented and in no distress.  The patient does not have symptoms concerning for COVID-19 infection (fever, chills, cough, or new shortness of breath).    Past Medical History:  Diagnosis Date  . Anemia   . Blood transfusion without reported diagnosis   . Breast cancer ([place]) 2017   Right Breast Cancer  . Breast cyst, left 01-04-12   cyst no longer apparent  . Cholelithiasis with choledocholithiasis   . Chronic calculus cholecystitis 12/25/2011   Lap chole on 03/25/12   .  Complication of anesthesia 2002   PT STATES DURING HER C-SECTION SHE WAS AWARE OF INCISION AND PAIN -THEN WAS QUICKLY "PUT OUT"  . Family history of adverse reaction to anesthesia    father became aggressive  . Gallstone pancreatitis   . Glucose intolerance (impaired glucose tolerance)    since at least 2011.   [name] Hepatic steatosis   . Hx of adenomatous polyp of colon 12/12/2018  . Hypercholesterolemia with hypertriglyceridemia   . Hypertension 01-04-12   med d/c-last taken 12-07-11  -PT [place] STOPPED HER B/P MEDICATION BECAUSE HER B/P TOO LOW AND PT STATES HER B/P'S HAVE BEEN OK SINCE.  [name] Hypertriglyceridemia   . Migraine without aura, without mention of intractable migraine without mention of status migrainosus   . PONV (postoperative nausea and vomiting)    patient states that she did not get sick with mastectomy  . Pre-diabetes    Past Surgical History:  Procedure Laterality Date  . CESAREAN SECTION     2002  . CHOLECYSTECTOMY  03/25/2012   Procedure: LAPAROSCOPIC CHOLECYSTECTOMY WITH INTRAOPERATIVE CHOLANGIOGRAM;  Surgeon: Haywood Lasso, MD;  Location: [facility];  Service: General;  Laterality: N/A;  Laparoscopic Cholecystectomy with Intraoperative Cholangiogram  . COLONOSCOPY    . ERCP  12/05/2011   Procedure: ENDOSCOPIC RETROGRADE CHOLANGIOPANCREATOGRAPHY (ERCP);  Surgeon: Inda Castle, MD;  Location: [facility];  Service: Endoscopy;  Laterality: N/A;  . ERCP  12/08/2011   Procedure: ENDOSCOPIC RETROGRADE CHOLANGIOPANCREATOGRAPHY (ERCP);  Surgeon: Inda Castle, MD;  Location: [facility];  Service: Gastroenterology;  Laterality: N/A;  . ESOPHAGOGASTRODUODENOSCOPY (EGD) WITH PROPOFOL N/A 09/28/2015   Procedure: ESOPHAGOGASTRODUODENOSCOPY (EGD) WITH PROPOFOL;  Surgeon: Clarene Essex, MD;  Location: [facility] ENDOSCOPY;  Service: Endoscopy;  Laterality: N/A;  H AND P IN DRAWER  . EUS N/A 10/13/2015   Procedure: UPPER ENDOSCOPIC ULTRASOUND (EUS) RADIAL;  Surgeon: Arta Silence, MD;  Location: [facility];  Service: Endoscopy;  Laterality: N/A;  . LAPAROSCOPIC PARTIAL GASTRECTOMY N/A 01/11/2016   Procedure: LAPAROSCOPIC PARTIAL GASTRECTOMY;  Surgeon: [name], MD;  Location: [place];  Service: General;  Laterality: N/A;  . MASTECTOMY Right 09/2015  . MASTECTOMY W/ SENTINEL NODE BIOPSY Right 09/15/2015   Procedure: RIGHT MASTECTOMY WITH SENTINEL LYMPH NODE BIOPSY;  Surgeon: Rolm Bookbinder, MD;  Location: [place];  Service: General;  Laterality: Right;  . UPPER GI ENDOSCOPY N/A 01/11/2016   Procedure: UPPER GI ENDOSCOPY;  Surgeon: [name], MD;  Location: [place];  Service: General;  Laterality: N/A;     Current Meds  Medication Sig  . Ascorbic Acid (VITAMIN C) 1000 MG tablet Take 1,000 mg by mouth daily.  . Cholecalciferol (VITAMIN D3) 10000 units capsule Take 10,000 Units by mouth daily.  [name] Ethyl (VASCEPA) 1 g CAPS Take 2 capsules (2 g total) by mouth 2 (two) times daily. Started 02/17/2019  . RESTASIS 0.05 % ophthalmic emulsion   . rosuvastatin (CRESTOR) 10 MG tablet Take 1 tablet (10 mg total) by mouth daily.  . vitamin B-12 (CYANOCOBALAMIN) 1000 MCG tablet Take 1,000 mcg by mouth daily.  . vitamin E 1000 UNIT capsule Take 1,000 Units by mouth daily.  [name] zinc gluconate 50 MG tablet Take 50 mg by mouth daily.     Allergies:   Lisinopril and Other   Social History   Tobacco Use  . Smoking status: Never Smoker  . Smokeless tobacco: Never Used  Substance Use Topics  . Alcohol use: No  . Drug use: No     Family Hx: The patient's family history includes Diabetes in her brother, father, and mother; Heart disease in her paternal uncle; Liver disease in her brother. There is no history of Colon cancer, Esophageal cancer, Rectal cancer, or Stomach cancer.  ROS:   Please see the history of present illness.    As mentioned above All other systems reviewed and are negative.   Prior CV studies:   The following studies were reviewed  today:  Results of the blood work were reviewed with her at extensive length  Labs/Other Tests and Data Reviewed:    EKG:  KG was reviewed from previous visit.  Recent Labs: 06/05/2019: TSH 0.979 09/10/2019: ALT 18; BUN 11; Creatinine, Ser 0.57; Hemoglobin 12.9; Platelets 285; Potassium 4.4; Sodium 141   Recent Lipid Panel Lab Results  Component Value Date/Time   CHOL 255 (H) 09/10/2019 04:18 PM   TRIG 370 (H) 09/10/2019 04:18 PM   HDL 53 09/10/2019 04:18 PM   CHOLHDL 4.8 (H) 09/10/2019 04:18 PM   CHOLHDL 5 05/15/2018 09:38 AM   LDLCALC 135 (H) 09/10/2019 04:18 PM   LDLDIRECT 132.0 05/15/2018 09:38 AM    Wt Readings from Last 3 Encounters:  09/25/19 149 lb (67.6 kg)  09/10/19 149 lb (67.6 kg)  06/05/19 148 lb (67.1 kg)     Objective:    Vital Signs:  BP (!) 134/93   Pulse 84   Wt 149 lb (67.6 kg)   LMP 11/29/2011   SpO2 99%   BMI 29.10 kg/m  VITAL SIGNS:  reviewed  ASSESSMENT & PLAN:    1. Hypertriglyceridemia: I discussed my findings with the patient at extensive length.  I told her that she has to be very compliant with the diet of the Lasix will be difficult for her going forward that she is these blood work causes her to be high risk for cardiovascular disease and she vocalized understanding.  She has been try to do better.  In the interim I have doubled her rosuvastatin dose.  I will change initiate her on high-dose triglycerides.  She will diet and exercise well and see me in follow-up appointment in 2 months.  Before that she will come for fasting lipids.  She will also have all other blood work.  Patient had multiple questions which were answered to her satisfaction.  COVID-19 Education: The signs and symptoms of COVID-19 were discussed with the patient and how to seek care for testing (follow up with PCP or arrange E-visit).  The importance of social distancing was discussed today.  Time:   Today, I have spent 15 minutes with the patient with telehealth  technology discussing the above problems.     Medication Adjustments/Labs and Tests Ordered: Current medicines are reviewed at length with the patient today.  Concerns regarding medicines are outlined above.   Tests Ordered: No orders of the defined types were placed in this encounter.   Medication Changes: No orders of the defined types were placed in this encounter.   Follow Up:  In Person 2 months  Signed, Jenean Lindau, MD  09/25/2019 3:52 PM    [facility]

## 2019-09-25 NOTE — Patient Instructions (Signed)
Medication Instructions:  Your physician has recommended you make the following change in your medication:   Increase your Crestor (Rosuvastatin) to 20 mg daily. Start Fenofibrate 145 mg daily.  *If you need a refill on your cardiac medications before your next appointment, please call your pharmacy*   Lab Work: You need to have labs before your next visit  when you are fasting.  You can come Monday through Friday 8:30 am to 12:00 pm and 1:15 to 4:30. You do not need to make an appointment as the order has already been placed. The labs you are going to have done are BMET, CBC, TSH, LFT and Lipids.  If you have labs (blood work) drawn today and your tests are completely normal, you will receive your results only by: [name] MyChart Message (if you have MyChart) OR . A paper copy in the mail If you have any lab test that is abnormal or we need to change your treatment, we will call you to review the results.   Testing/Procedures: None ordered   Follow-Up: At [facility], you and your health needs are our priority.  As part of our continuing mission to provide you with exceptional heart care, we have created designated Provider Care Teams.  These Care Teams include your primary Cardiologist (physician) and Advanced Practice Providers (APPs -  Physician Assistants and Nurse Practitioners) who all work together to provide you with the care you need, when you need it.  We recommend signing up for the patient portal called "MyChart".  Sign up information is provided on this After Visit Summary.  MyChart is used to connect with patients for Virtual Visits (Telemedicine).  Patients are able to view lab/test results, encounter notes, upcoming appointments, etc.  Non-urgent messages can be sent to your pro2vider as well.   To learn more about what you can do with MyChart, go to [email or web address].    Your next appointment:   2 month(s)  The format for your next appointment:   In  Person  Provider:   Jyl Heinz, MD   Other Instructions NA

## 2019-10-21 MED FILL — VASCEPA 1 GM CAPSULE: 1 | 90 days supply | Qty: 360 | Fill #0

## 2019-11-07 ENCOUNTER — Telehealth: Payer: Self-pay | Admitting: Cardiology

## 2019-11-07 NOTE — Telephone Encounter (Signed)
I will route this to Dr. Geraldo Pitter to see if he has any recommendations.

## 2019-11-07 NOTE — Telephone Encounter (Signed)
Patient states she thinks the Fenofibrate Dr. Geraldo Pitter started her on is causing her to have dry mouth. She said she wanted to know if he could prescribe something different.

## 2019-11-07 NOTE — Telephone Encounter (Signed)
Please check with patient's insurance will have any other company or plan to fenofibrate and let us see if that works.

## 2019-11-19 MED ORDER — ATORVASTATIN CALCIUM 20 MG PO TABS: 20.0000 mg | ORAL_TABLET | Freq: Every day | ORAL | 3 refills | Status: DC

## 2019-11-19 MED FILL — ATORVASTATIN 20 MG TABLET: 20 | 90 days supply | Qty: 90 | Fill #0

## 2019-11-19 NOTE — Addendum Note (Signed)
Addended by: Truddie Hidden on: 11/19/2019 03:33 PM   Modules accepted: Orders

## 2019-12-04 ENCOUNTER — Encounter: Payer: Self-pay | Admitting: Internal Medicine

## 2019-12-04 ENCOUNTER — Other Ambulatory Visit: Payer: Self-pay

## 2019-12-04 ENCOUNTER — Ambulatory Visit (INDEPENDENT_AMBULATORY_CARE_PROVIDER_SITE_OTHER): Payer: No Typology Code available for payment source | Admitting: Internal Medicine

## 2019-12-04 VITALS — BP 122/76 | HR 85 | Temp 98.1°F | Ht 60.2 in | Wt 144.8 lb

## 2019-12-04 DIAGNOSIS — E782 Mixed hyperlipidemia: Secondary | ICD-10-CM | POA: Diagnosis not present

## 2019-12-04 DIAGNOSIS — E118 Type 2 diabetes mellitus with unspecified complications: Secondary | ICD-10-CM | POA: Diagnosis not present

## 2019-12-04 DIAGNOSIS — L659 Nonscarring hair loss, unspecified: Secondary | ICD-10-CM

## 2019-12-04 DIAGNOSIS — I10 Essential (primary) hypertension: Secondary | ICD-10-CM | POA: Diagnosis not present

## 2019-12-04 NOTE — Patient Instructions (Signed)
You may look for me in Instagram at [facility] My business email is [email or web address]  [email or web address]

## 2019-12-04 NOTE — Progress Notes (Signed)
This visit occurred during the SARS-CoV-2 public health emergency.  Safety protocols were in place, including screening questions prior to the visit, additional usage of staff PPE, and extensive cleaning of exam room while observing appropriate contact time as indicated for disinfecting solutions.  Subjective:     Patient ID: Nichole Wilson , female    DOB: May 07, 1961 , 59 y.o.   MRN: [id number]   Chief Complaint  Patient presents with  . Diabetes    HPI Here for Dm FU. Has been sticking to DM diet and eating smaller portions. Has waking and biking for 30 minutes walking a day. She continued taking supplements to support her glucose and is tolerating them well.   Past Medical History:  Diagnosis Date  . Anemia   . Blood transfusion without reported diagnosis   . Breast cancer ([place]) 2017   Right Breast Cancer  . Breast cyst, left 01-04-12   cyst no longer apparent  . Cholelithiasis with choledocholithiasis   . Chronic calculus cholecystitis 12/25/2011   Lap chole on 03/25/12   . Complication of anesthesia 2002   PT STATES DURING HER C-SECTION SHE WAS AWARE OF INCISION AND PAIN -THEN WAS QUICKLY "PUT OUT"  . Family history of adverse reaction to anesthesia    father became aggressive  . Gallstone pancreatitis   . Glucose intolerance (impaired glucose tolerance)    since at least 2011.   [name] Hepatic steatosis   . Hx of adenomatous polyp of colon 12/12/2018  . Hypercholesterolemia with hypertriglyceridemia   . Hypertension 01-04-12   med d/c-last taken 12-07-11  -PT [name] STOPPED HER B/P MEDICATION BECAUSE HER B/P TOO LOW AND PT STATES HER B/P'S HAVE BEEN OK SINCE.  [name] Hypertriglyceridemia   . Migraine without aura, without mention of intractable migraine without mention of status migrainosus   . PONV (postoperative nausea and vomiting)    patient states that she did not get sick with mastectomy  . Pre-diabetes      Family History  Problem Relation Age of Onset  .  Diabetes Mother   . Diabetes Father   . Diabetes Brother   . Heart disease Paternal Uncle   . Liver disease Brother   . Colon cancer Neg Hx   . Esophageal cancer Neg Hx   . Rectal cancer Neg Hx   . Stomach cancer Neg Hx      Current Outpatient Medications:  .  Ascorbic Acid (VITAMIN C) 1000 MG tablet, Take 1,000 mg by mouth daily., Disp: , Rfl:  .  atorvastatin (LIPITOR) 20 MG tablet, Take 1 tablet (20 mg total) by mouth daily., Disp: 90 tablet, Rfl: 3 .  Cholecalciferol (VITAMIN D3) 10000 units capsule, Take 10,000 Units by mouth daily., Disp: , Rfl:  .  fenofibrate (TRICOR) 145 MG tablet, Take 1 tablet (145 mg total) by mouth daily., Disp: 90 tablet, Rfl: 3 .  Icosapent Ethyl (VASCEPA) 1 g CAPS, Take 2 capsules (2 g total) by mouth 2 (two) times daily. Started 02/17/2019, Disp: 360 capsule, Rfl: 1 .  RESTASIS 0.05 % ophthalmic emulsion, , Disp: , Rfl: 2 .  vitamin B-12 (CYANOCOBALAMIN) 1000 MCG tablet, Take 1,000 mcg by mouth daily., Disp: , Rfl:  .  vitamin E 1000 UNIT capsule, Take 1,000 Units by mouth daily., Disp: , Rfl:  .  zinc gluconate 50 MG tablet, Take 50 mg by mouth daily., Disp: , Rfl:  .  rosuvastatin (CRESTOR) 20 MG tablet, Take 0.5 tablets (10 mg total) by  mouth daily. (Patient not taking: Reported on 12/04/2019), Disp: 90 tablet, Rfl: 3   Allergies  Allergen Reactions  . Lisinopril Swelling  . Other     General anesthesia-severe vomiting     Review of Systems  Review of Systems  Constitutional: Negative for diaphoresis and unexpected weight change.  HENT: Negative for tinnitus.   Eyes: Negative for visual disturbance.  Respiratory: Negative for chest tightness and shortness of breath.   Cardiovascular: Negative for chest pain, palpitations and leg swelling. She had chest pains on her L chest chest last week which lasted x 2 days and resolved. Has been fatigued since and a little bit better in the past 2 days. That pain felt like a dull pain and lasted all day. This  was provoked with pressing on this area. Denies sweating or nausea then.  Gastrointestinal: Negative for constipation, diarrhea and nausea.  Endocrine: Negative for polydipsia, polyphagia and polyuria.  Genitourinary: Negative for dysuria and frequency.  Skin: Negative for rash and wound. Has noticed increased hair loss Neurological: Negative for dizziness, speech difficulty, weakness, numbness and headaches.   Today's Vitals   12/04/19 0903  BP: 122/76  Pulse: 85  Temp: 98.1 F (36.7 C)  TempSrc: Oral  SpO2: 98%  Weight: 144 lb 12.8 oz (65.7 kg)  Height: 5' 0.2" (1.529 m)  PainSc: 0-No pain   Body mass index is 28.09 kg/m.   Objective:  Physical Exam  Wt is down 5 lbs less Constitutional: She is oriented to person, place, and time. She appears well-developed and well-nourished. No distress.  HENT:  Head: Normocephalic and atraumatic.  Right Ear: External ear normal.  Left Ear: External ear normal.  Nose: Nose normal.  Eyes: Conjunctivae are normal. Right eye exhibits no discharge. Left eye exhibits no discharge. No scleral icterus.  Neck: Neck supple. No thyromegaly present.  No carotid bruits bilaterally  Cardiovascular: Normal rate and regular rhythm.  No murmur heard. Pulmonary/Chest: Effort normal and breath sounds normal. No respiratory distress.  Musculoskeletal: Normal range of motion. She exhibits no edema.  Lymphadenopathy:    She has no cervical adenopathy.  Neurological: She is alert and oriented to person, place, and time.  Skin: Skin is warm and dry. Capillary refill takes less than 2 seconds. No rash noted. She is not diaphoretic.  Psychiatric: She has a normal mood and affect. Her behavior is normal. Judgment and thought content normal.  Nursing note reviewed.  Assessment And Plan:     1. Type 2 diabetes mellitus with complication, without long-term current use of insulin ([place])- chronic. May continue current diet, supplement.  - Hemoglobin A1C  FU with  Nichole Wilson  In 3 months.  2. Essential hypertension- stable. Will continue FU with her cardiologist.  - CMP14 + Anion Gap  3. Hypercholesterolemia with hypertriglyceridemia- Will continue FU with her cardiologist.  - CBC no Diff - Lipid panel  4. Hair loss-new.  - TSH We will inform her of her results via Mychart when they are back.   Nichole Selke [name], PA-C    THE PATIENT IS ENCOURAGED TO PRACTICE SOCIAL DISTANCING DUE TO THE COVID-19 PANDEMIC.

## 2019-12-05 LAB — CMP14 + ANION GAP
ALT: 17 IU/L (ref 0–32)
AST: 16 IU/L (ref 0–40)
Albumin/Globulin Ratio: 1.7 (ref 1.2–2.2)
Albumin: 4.5 g/dL (ref 3.8–4.9)
Alkaline Phosphatase: 140 IU/L — ABNORMAL HIGH (ref 48–121)
Anion Gap: 14 mmol/L (ref 10.0–18.0)
BUN/Creatinine Ratio: 23 (ref 9–23)
BUN: 15 mg/dL (ref 6–24)
Bilirubin Total: 0.4 mg/dL (ref 0.0–1.2)
CO2: 23 mmol/L (ref 20–29)
Calcium: 9.5 mg/dL (ref 8.7–10.2)
Chloride: 103 mmol/L (ref 96–106)
Creatinine, Ser: 0.64 mg/dL (ref 0.57–1.00)
GFR calc Af Amer: 115 mL/min/{1.73_m2} (ref >59)
GFR calc non Af Amer: 100 mL/min/{1.73_m2} (ref >59)
Globulin, Total: 2.6 g/dL (ref 1.5–4.5)
Glucose: 113 mg/dL — ABNORMAL HIGH (ref 65–99)
Potassium: 4.2 mmol/L (ref 3.5–5.2)
Sodium: 140 mmol/L (ref 134–144)
Total Protein: 7.1 g/dL (ref 6.0–8.5)

## 2019-12-05 LAB — LIPID PANEL
Chol/HDL Ratio: 2.6 ratio (ref 0.0–4.4)
Cholesterol, Total: 166 mg/dL (ref 100–199)
HDL: 63 mg/dL (ref >39)
LDL Chol Calc (NIH): 86 mg/dL (ref 0–99)
Triglycerides: 93 mg/dL (ref 0–149)
VLDL Cholesterol Cal: 17 mg/dL (ref 5–40)

## 2019-12-05 LAB — CBC
Hematocrit: 37.3 % (ref 34.0–46.6)
Hemoglobin: 12.4 g/dL (ref 11.1–15.9)
MCH: 31.5 pg (ref 26.6–33.0)
MCHC: 33.2 g/dL (ref 31.5–35.7)
MCV: 95 fL (ref 79–97)
Platelets: 345 10*3/uL (ref 150–450)
RBC: 3.94 x10E6/uL (ref 3.77–5.28)
RDW: 12.6 % (ref 11.7–15.4)
WBC: 4.9 10*3/uL (ref 3.4–10.8)

## 2019-12-05 LAB — HEMOGLOBIN A1C
Est. average glucose Bld gHb Est-mCnc: 134 mg/dL
Hgb A1c MFr Bld: 6.3 % — ABNORMAL HIGH (ref 4.8–5.6)

## 2019-12-05 LAB — TSH: TSH: 1.17 u[IU]/mL (ref 0.450–4.500)

## 2019-12-11 ENCOUNTER — Ambulatory Visit: Payer: No Typology Code available for payment source | Admitting: Cardiology

## 2019-12-11 ENCOUNTER — Ambulatory Visit: Payer: No Typology Code available for payment source | Admitting: Internal Medicine

## 2019-12-17 ENCOUNTER — Encounter: Payer: Self-pay | Admitting: Cardiology

## 2019-12-17 ENCOUNTER — Ambulatory Visit: Payer: No Typology Code available for payment source | Admitting: Cardiology

## 2019-12-17 ENCOUNTER — Other Ambulatory Visit: Payer: Self-pay

## 2019-12-17 VITALS — BP 128/86 | HR 86 | Ht 60.0 in | Wt 143.0 lb

## 2019-12-17 DIAGNOSIS — E782 Mixed hyperlipidemia: Secondary | ICD-10-CM

## 2019-12-17 NOTE — Patient Instructions (Signed)
Medication Instructions:  No medication changes. *If you need a refill on your cardiac medications before your next appointment, please call your pharmacy*   Lab Work: Your physician recommends that you return for lab work in: before your next visit. You need to have labs done when you are fasting.  You can come Monday through Friday 8:30 am to 12:00 pm and 1:15 to 4:30. You do not need to make an appointment as the order has already been placed. The labs you are going to have done are BMET, LFT and Lipids.  If you have labs (blood work) drawn today and your tests are completely normal, you will receive your results only by: MyChart Message (if you have MyChart) OR A paper copy in the mail If you have any lab test that is abnormal or we need to change your treatment, we will call you to review the results.   Testing/Procedures: None ordered   Follow-Up: At [facility], you and your health needs are our priority.  As part of our continuing mission to provide you with exceptional heart care, we have created designated Provider Care Teams.  These Care Teams include your primary Cardiologist (physician) and Advanced Practice Providers (APPs -  Physician Assistants and Nurse Practitioners) who all work together to provide you with the care you need, when you need it.  We recommend signing up for the patient portal called "MyChart".  Sign up information is provided on this After Visit Summary.  MyChart is used to connect with patients for Virtual Visits (Telemedicine).  Patients are able to view lab/test results, encounter notes, upcoming appointments, etc.  Non-urgent messages can be sent to your provider as well.   To learn more about what you can do with MyChart, go to [email or web address].    Your next appointment:   6 month(s)  The format for your next appointment:   In Person  Provider:   Rajan Revankar, MD   Other Instructions NA  

## 2019-12-17 NOTE — Progress Notes (Signed)
Cardiology Office Note:    Date:  12/17/2019   ID:  [name], DOB 1960-09-15, MRN [id number]  PCP:  Shelby Mattocks, PA-C  Cardiologist:  Jenean Lindau, MD   Referring MD: Carol Ada*    ASSESSMENT:    1. Hypercholesterolemia with hypertriglyceridemia    PLAN:    In order of problems listed above:  1. Atherosclerotic vascular disease: This is diagnosed by mild atherosclerotic calcification of the aortic arch.  Secondary prevention stressed with the patient.  Importance of compliance with diet medication stressed and she vocalized understanding.  She is doing very well with exercise.  She was advised to walk at least half an hour a day on a daily basis. 2. Mixed dyslipidemia: She has done a remarkable job with medications and exercise and diet and I congratulated her about the same. 3. Patient will be seen in follow-up appointment in 6 months or earlier if the patient has any concerns.  She will come for blood work a week before this visit so we can revisit this issue again.  Patient had multiple questions which were answered to her satisfaction.   Medication Adjustments/Labs and Tests Ordered: Current medicines are reviewed at length with the patient today.  Concerns regarding medicines are outlined above.  No orders of the defined types were placed in this encounter.  No orders of the defined types were placed in this encounter.    Chief Complaint  Patient presents with  . Follow-up     History of Present Illness:    Nichole Wilson is a 59 y.o. female.  Patient has past medical history of atherosclerotic vascular disease.  Patient had mild atherosclerotic calcification of the aortic arch, marked mixed dyslipidemia with hypertriglyceridemia.  She denies any problems at this time and takes care of activities of daily living.  She has done very well with exercise and diet.  At the time of my evaluation, the patient is alert awake oriented and in no  distress.  Past Medical History:  Diagnosis Date  . Abnormal LFTs 10/31/2018  . ANA positive 08/06/2017  . Anal fissure - posterior 11/26/2015  . Anemia   . Anterior scleritis of both eyes 05/12/2015   Followed by Dr Teola Bradley - Last OV 11/10, 11/18 - Update 11/18 - dx Acute toxic conjunctivitis > improved s/p prednisolone - Primary iridocyclitis (R-eye) - begin taper of prednisolone eye drops, much improved - Requested labs all resulted 11/16: ESR (21), ANA (neg), RPR (non-reactive, no FTA-Ab) >> faxed results back to Dr Sharen Counter   . Blood transfusion without reported diagnosis   . Breast cancer ([place]) 2017   Right Breast Cancer  . Breast cancer of upper-outer quadrant of right female breast ([place]) 08/26/2015  . Breast cyst, left 01-04-12   cyst no longer apparent  . Cholelithiasis with choledocholithiasis   . Chronic calculus cholecystitis 12/25/2011   Lap chole on 03/25/12   . Complication of anesthesia 2002   PT STATES DURING HER C-SECTION SHE WAS AWARE OF INCISION AND PAIN -THEN WAS QUICKLY "PUT OUT"  . Elevated alkaline phosphatase level 08/07/2018   Normal Alk Phos Isoenzyme  . Essential hypertension 01/04/2016  . Family history of adverse reaction to anesthesia    father became aggressive  . Fatigue 07/22/2015  . Gallstone pancreatitis   . Gastric mass GIST vs leiomyoma 12/01/2015  . Glucose intolerance (impaired glucose tolerance)    since at least 2011.   [name] Health care maintenance 03/24/2013  . Hepatic steatosis   .  Hx of adenomatous polyp of colon 12/12/2018  . Hypercholesterolemia with hypertriglyceridemia   . Hypertension 01-04-12   med d/c-last taken 12-07-11  -PT [facility] STOPPED HER B/P MEDICATION BECAUSE HER B/P TOO LOW AND PT STATES HER B/P'S HAVE BEEN OK SINCE.  [name] Hypertriglyceridemia   . Iron deficiency anemia 10/25/2018  . Migraine without aura, without mention of intractable migraine without mention of status migrainosus   . Overweight (BMI 25.0-29.9)  05/12/2015  . Plantar fasciitis 09/05/2013  . PONV (postoperative nausea and vomiting)    patient states that she did not get sick with mastectomy  . Pre-diabetes   . Sciatica 05/17/2012   Patient with left leg radicular lumbar pain.  No concerning symptoms aside from pain. Plan: Trial of steroid dosepak and tramadol as needed for pain Discussed warning signs or symptoms. Please see discharge instructions. Patient expresses understanding. F/u 2 weeks if not improved.    . Type 2 diabetes mellitus with complication, without long-term current use of insulin ([place]) 05/12/2015    Past Surgical History:  Procedure Laterality Date  . CESAREAN SECTION     2002  . CHOLECYSTECTOMY  03/25/2012   Procedure: LAPAROSCOPIC CHOLECYSTECTOMY WITH INTRAOPERATIVE CHOLANGIOGRAM;  Surgeon: Haywood Lasso, MD;  Location: [facility];  Service: General;  Laterality: N/A;  Laparoscopic Cholecystectomy with Intraoperative Cholangiogram  . COLONOSCOPY    . ERCP  12/05/2011   Procedure: ENDOSCOPIC RETROGRADE CHOLANGIOPANCREATOGRAPHY (ERCP);  Surgeon: Inda Castle, MD;  Location: [facility];  Service: Endoscopy;  Laterality: N/A;  . ERCP  12/08/2011   Procedure: ENDOSCOPIC RETROGRADE CHOLANGIOPANCREATOGRAPHY (ERCP);  Surgeon: Inda Castle, MD;  Location: [facility];  Service: Gastroenterology;  Laterality: N/A;  . ESOPHAGOGASTRODUODENOSCOPY (EGD) WITH PROPOFOL N/A 09/28/2015   Procedure: ESOPHAGOGASTRODUODENOSCOPY (EGD) WITH PROPOFOL;  Surgeon: Clarene Essex, MD;  [facility];  Service: Endoscopy;  Laterality: N/A;  H AND P IN DRAWER  . EUS N/A 10/13/2015   Procedure: UPPER ENDOSCOPIC ULTRASOUND (EUS) RADIAL;  Surgeon: Arta Silence, MD;  Location: [facility];  Service: Endoscopy;  Laterality: N/A;  . LAPAROSCOPIC PARTIAL GASTRECTOMY N/A 01/11/2016   Procedure: LAPAROSCOPIC PARTIAL GASTRECTOMY;  Surgeon: [name], MD;  Location: [facility];  Service: General;  Laterality: N/A;  . MASTECTOMY Right 09/2015  .  MASTECTOMY W/ SENTINEL NODE BIOPSY Right 09/15/2015   Procedure: RIGHT MASTECTOMY WITH SENTINEL LYMPH NODE BIOPSY;  Surgeon: Rolm Bookbinder, MD;  Location: [facility];  Service: General;  Laterality: Right;  . UPPER GI ENDOSCOPY N/A 01/11/2016   Procedure: UPPER GI ENDOSCOPY;  Surgeon: [name], MD;  Location: [facility];  Service: General;  Laterality: N/A;    Current Medications: Current Meds  Medication Sig  . Ascorbic Acid (VITAMIN C) 1000 MG tablet Take 1,000 mg by mouth daily.  [name] atorvastatin (LIPITOR) 20 MG tablet Take 1 tablet (20 mg total) by mouth daily.  . Cholecalciferol (VITAMIN D3) 10000 units capsule Take 10,000 Units by mouth daily.  . fenofibrate (TRICOR) 145 MG tablet Take 1 tablet (145 mg total) by mouth daily.  [name] Ethyl (VASCEPA) 1 g CAPS Take 2 capsules (2 g total) by mouth 2 (two) times daily. Started 02/17/2019  . pantoprazole (PROTONIX) 40 MG tablet Take 40 mg by mouth daily.  . RESTASIS 0.05 % ophthalmic emulsion   . vitamin B-12 (CYANOCOBALAMIN) 1000 MCG tablet Take 1,000 mcg by mouth daily.  . vitamin E 1000 UNIT capsule Take 1,000 Units by mouth daily.  [name] zinc gluconate 50 MG tablet Take  50 mg by mouth daily.     Allergies:   Lisinopril and Other   Social History   Socioeconomic History  . Marital status: Married    Spouse name: Not on file  . Number of children: 2  . Years of education: Not on file  . Highest education level: Not on file  Occupational History  . 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   Tobacco Use  . Smoking status: Never Smoker  . Smokeless tobacco: Never Used  Substance and Sexual Activity  . Alcohol use: No  . Drug use: No  . Sexual activity: Not Currently  Other Topics Concern  . Not on file  Social History Narrative   Married, husband is a Dealer   2 young adult daughters   - spanish interpreter   + children   No caffeine   Social  Determinants of Radio broadcast assistant Strain:   . Difficulty of Paying Living Expenses:   Food Insecurity:   . Worried About Charity fundraiser in the Last Year:   . Arboriculturist in the Last Year:   Transportation Needs:   . Film/video editor (Medical):   [name] Lack of Transportation (Non-Medical):   Physical Activity:   . Days of Exercise per Week:   . Minutes of Exercise per Session:   Stress:   . Feeling of Stress :   Social Connections:   . Frequency of Communication with Friends and Family:   . Frequency of Social Gatherings with Friends and Family:   . Attends Religious Services:   . Active Member of Clubs or Organizations:   . Attends Archivist Meetings:   [name] Marital Status:      Family History: The patient's family history includes Diabetes in her brother, father, and mother; Heart disease in her paternal uncle; Liver disease in her brother. There is no history of Colon cancer, Esophageal cancer, Rectal cancer, or Stomach cancer.  ROS:   Please see the history of present illness.    All other systems reviewed and are negative.  EKGs/Labs/Other Studies Reviewed:    The following studies were reviewed today: I discussed my findings with the patient at length including lipids   Recent Labs: 12/04/2019: ALT 17; BUN 15; Creatinine, Ser 0.64; Hemoglobin 12.4; Platelets 345; Potassium 4.2; Sodium 140; TSH 1.170  Recent Lipid Panel    Component Value Date/Time   CHOL 166 12/04/2019 0936   TRIG 93 12/04/2019 0936   HDL 63 12/04/2019 0936   CHOLHDL 2.6 12/04/2019 0936   CHOLHDL 5 05/15/2018 0938   VLDL 71.2 (H) 05/15/2018 0938   LDLCALC 86 12/04/2019 0936   LDLDIRECT 132.0 05/15/2018 0938    Physical Exam:    VS:  BP 128/86   Pulse 86   Ht 5' (1.524 m)   Wt 143 lb (64.9 kg)   LMP 11/29/2011   SpO2 98%   BMI 27.93 kg/m     Wt Readings from Last 3 Encounters:  12/17/19 143 lb (64.9 kg)  12/04/19 144 lb 12.8 oz (65.7 kg)  09/25/19 149  lb (67.6 kg)     GEN: Patient is in no acute distress HEENT: Normal NECK: No JVD; No carotid bruits LYMPHATICS: No lymphadenopathy CARDIAC: Hear sounds regular, 2/6 systolic murmur at the apex. RESPIRATORY:  Clear to auscultation without rales, wheezing or rhonchi  ABDOMEN: Soft, non-tender, non-distended MUSCULOSKELETAL:  No edema; No deformity  SKIN: Warm and dry NEUROLOGIC:  Alert and oriented x 3 PSYCHIATRIC:  Normal affect   Signed, Jenean Lindau, MD  12/17/2019 9:42 AM    [facility]

## 2020-01-05 ENCOUNTER — Ambulatory Visit: Payer: No Typology Code available for payment source

## 2020-01-07 ENCOUNTER — Other Ambulatory Visit: Payer: Self-pay

## 2020-01-07 ENCOUNTER — Ambulatory Visit
Admission: RE | Admit: 2020-01-07 | Discharge: 2020-01-07 | Disposition: A | Discharge status: Home / Self Care | Payer: No Typology Code available for payment source | Source: Ambulatory Visit | Attending: Internal Medicine | Admitting: Internal Medicine

## 2020-01-07 DIAGNOSIS — Z1231 Encounter for screening mammogram for malignant neoplasm of breast: Secondary | ICD-10-CM

## 2020-01-07 DIAGNOSIS — Z853 Personal history of malignant neoplasm of breast: Secondary | ICD-10-CM

## 2020-01-08 ENCOUNTER — Other Ambulatory Visit: Payer: Self-pay | Admitting: Internal Medicine

## 2020-01-08 ENCOUNTER — Ambulatory Visit (INDEPENDENT_AMBULATORY_CARE_PROVIDER_SITE_OTHER): Payer: No Typology Code available for payment source | Admitting: Nurse Practitioner

## 2020-01-08 ENCOUNTER — Other Ambulatory Visit: Payer: Self-pay

## 2020-01-08 VITALS — BP 124/78 | HR 89 | Temp 98.6°F | Ht 59.2 in | Wt 144.0 lb

## 2020-01-08 DIAGNOSIS — Z9011 Acquired absence of right breast and nipple: Secondary | ICD-10-CM | POA: Diagnosis not present

## 2020-01-08 DIAGNOSIS — N644 Mastodynia: Secondary | ICD-10-CM

## 2020-01-08 DIAGNOSIS — Z853 Personal history of malignant neoplasm of breast: Secondary | ICD-10-CM

## 2020-01-08 NOTE — Progress Notes (Signed)
This visit occurred during the SARS-CoV-2 public health emergency.  Safety protocols were in place, including screening questions prior to the visit, additional usage of staff PPE, and extensive cleaning of exam room while observing appropriate contact time as indicated for disinfecting solutions.  Subjective:     Patient ID: Nichole Wilson , female    DOB: 24-May-1961 , 59 y.o.   MRN: [id number]   No chief complaint on file.   HPI  Here today for breast pain, she was on her way to her mammogram yesterday but was having pain so she needs an evaluation.     Past Medical History:  Diagnosis Date   Abnormal LFTs 10/31/2018   ANA positive 08/06/2017   Anal fissure - posterior 11/26/2015   Anemia    Anterior scleritis of both eyes 05/12/2015   Followed by Dr Teola Bradley - Last OV 11/10, 11/18 - Update 11/18 - dx Acute toxic conjunctivitis > improved s/p prednisolone - Primary iridocyclitis (R-eye) - begin taper of prednisolone eye drops, much improved - Requested labs all resulted 11/16: ESR (21), ANA (neg), RPR (non-reactive, no FTA-Ab) >> faxed results back to Dr Sharen Counter    Blood transfusion without reported diagnosis    Breast cancer [facility]) 2017   Right Breast Cancer   Breast cancer of upper-outer quadrant of right female breast ([place]) 08/26/2015   Breast cyst, left 01-04-12   cyst no longer apparent   Cholelithiasis with choledocholithiasis    Chronic calculus cholecystitis 12/25/2011   Lap chole on 11/20/23    Complication of anesthesia 2002   PT STATES DURING HER C-SECTION SHE WAS AWARE OF INCISION AND PAIN -THEN WAS QUICKLY "PUT OUT"   Elevated alkaline phosphatase level 08/07/2018   Normal Alk Phos Isoenzyme   Essential hypertension 01/04/2016   Family history of adverse reaction to anesthesia    father became aggressive   Fatigue 07/22/2015   Gallstone pancreatitis    Gastric mass GIST vs leiomyoma 12/01/2015   Glucose intolerance (impaired glucose tolerance)    since at  least 2011.    Health care maintenance 03/24/2013   Hepatic steatosis    Hx of adenomatous polyp of colon 12/12/2018   Hypercholesterolemia with hypertriglyceridemia    Hypertension 01-04-12   med d/c-last taken 12-07-11  -PT [place] STOPPED HER B/P MEDICATION BECAUSE HER B/P TOO LOW AND PT STATES HER B/P'S HAVE BEEN OK SINCE.   Hypertriglyceridemia    Iron deficiency anemia 10/25/2018   Migraine without aura, without mention of intractable migraine without mention of status migrainosus    Overweight (BMI 25.0-29.9) 05/12/2015   Plantar fasciitis 09/05/2013   PONV (postoperative nausea and vomiting)    patient states that she did not get sick with mastectomy   Pre-diabetes    Sciatica 05/17/2012   Patient with left leg radicular lumbar pain.  No concerning symptoms aside from pain. Plan: Trial of steroid dosepak and tramadol as needed for pain Discussed warning signs or symptoms. Please see discharge instructions. Patient expresses understanding. F/u 2 weeks if not improved.     Type 2 diabetes mellitus with complication, without long-term current use of insulin ([place]) 05/12/2015     Family History  Problem Relation Age of Onset   Diabetes Mother    Diabetes Father    Diabetes Brother    Heart disease Paternal Uncle    Liver disease Brother    Colon cancer Neg Hx    Esophageal cancer Neg Hx    Rectal cancer  Neg Hx    Stomach cancer Neg Hx      Current Outpatient Medications:    Ascorbic Acid (VITAMIN C) 1000 MG tablet, Take 1,000 mg by mouth daily., Disp: , Rfl:    atorvastatin (LIPITOR) 20 MG tablet, Take 1 tablet (20 mg total) by mouth daily., Disp: 90 tablet, Rfl: 3   Cholecalciferol (VITAMIN D3) 10000 units capsule, Take 10,000 Units by mouth daily., Disp: , Rfl:    fenofibrate (TRICOR) 145 MG tablet, Take 1 tablet (145 mg total) by mouth daily., Disp: 90 tablet, Rfl: 3   Icosapent Ethyl (VASCEPA) 1 g CAPS, Take 2 capsules (2 g total) by  mouth 2 (two) times daily. Started 02/17/2019, Disp: 360 capsule, Rfl: 1   pantoprazole (PROTONIX) 40 MG tablet, Take 40 mg by mouth daily., Disp: , Rfl:    pyridOXINE (VITAMIN B-6) 100 MG tablet, Take 100 mg by mouth daily., Disp: , Rfl:    RESTASIS 0.05 % ophthalmic emulsion, , Disp: , Rfl: 2   Thiamine HCl (VITAMIN B-1) 250 MG tablet, Take 250 mg by mouth daily., Disp: , Rfl:    vitamin B-12 (CYANOCOBALAMIN) 1000 MCG tablet, Take 1,000 mcg by mouth daily., Disp: , Rfl:    vitamin E 1000 UNIT capsule, Take 1,000 Units by mouth daily., Disp: , Rfl:    zinc gluconate 50 MG tablet, Take 50 mg by mouth daily., Disp: , Rfl:    Allergies  Allergen Reactions   Lisinopril Swelling   Other     General anesthesia-severe vomiting     Review of Systems  Constitutional: Negative.   Respiratory: Negative.   Cardiovascular: Negative.  Negative for chest pain, palpitations and leg swelling.  Genitourinary:       Left breast tenderness  Psychiatric/Behavioral: Negative.      Today's Vitals   01/08/20 0911  BP: 124/78  Pulse: 89  Temp: 98.6 F (37 C)  TempSrc: Oral  SpO2: 96%  Weight: 144 lb (65.3 kg)  Height: 4' 11.2" (1.504 m)  PainSc: 2   PainLoc: Breast   Body mass index is 28.89 kg/m.   Objective:  Physical Exam Constitutional:      Appearance: Normal appearance.  Cardiovascular:     Rate and Rhythm: Normal rate and regular rhythm.  Chest:     Chest wall: Tenderness (left lateral breast outer area tenderness at 3 o'clock) present. No mass.  Musculoskeletal:        General: Normal range of motion.  Neurological:     Mental Status: She is alert.         Assessment And Plan:     1. Breast pain, left Left lateral breast pain outer quadrant at 3 oclock  2. Personal history of breast cancer  Previous history of breast cancer  3. History of right mastectomy  She did not have to take chemo or radiation   [name], FNP    THE PATIENT IS ENCOURAGED TO  PRACTICE SOCIAL DISTANCING DUE TO THE COVID-19 PANDEMIC.

## 2020-01-12 ENCOUNTER — Encounter: Payer: Self-pay | Admitting: Nurse Practitioner

## 2020-01-22 ENCOUNTER — Ambulatory Visit
Admission: RE | Admit: 2020-01-22 | Discharge: 2020-01-22 | Disposition: A | Discharge status: Home / Self Care | Payer: No Typology Code available for payment source | Source: Ambulatory Visit | Attending: Internal Medicine | Admitting: Internal Medicine

## 2020-01-22 ENCOUNTER — Other Ambulatory Visit: Payer: Self-pay

## 2020-01-22 DIAGNOSIS — N644 Mastodynia: Secondary | ICD-10-CM

## 2020-02-09 DIAGNOSIS — U071 COVID-19: Secondary | ICD-10-CM | POA: Insufficient documentation

## 2020-02-09 DIAGNOSIS — J1282 Pneumonia due to coronavirus disease 2019: Secondary | ICD-10-CM | POA: Insufficient documentation

## 2020-02-09 HISTORY — DX: COVID-19: U07.1

## 2020-02-17 ENCOUNTER — Other Ambulatory Visit: Payer: Self-pay | Admitting: Internal Medicine

## 2020-02-17 MED FILL — PANTOPRAZOLE SOD DR 40 MG T: 40 | 90 days supply | Qty: 90 | Fill #0

## 2020-02-18 ENCOUNTER — Encounter: Payer: Self-pay | Admitting: *Deleted

## 2020-02-18 ENCOUNTER — Telehealth: Payer: Self-pay

## 2020-02-18 ENCOUNTER — Other Ambulatory Visit: Payer: Self-pay | Admitting: *Deleted

## 2020-02-18 NOTE — Patient Outreach (Signed)
[facility]) Care Management  02/18/2020  Charlayne Vultaggio [birth year] [id number]   Transition of care call  Referral received:02/17/20 Initial outreach:02/18/20 Insurance: [employer]    Subjective: Return call from patient . Initial  successful telephone call to patient's preferred number in order to complete transition of care assessment; 2 HIPAA identifiers verified. Explained purpose of call and completed transition of care assessment.  Almyra Free states that she is feeling better little by little. She discussed continuing to wear oxygen at 4 liters, states that her oxygen saturation level is 95% . She reports that cough is improving and using cough medication as needed she denies having a temperature elevation.  Reinforced patient use of flutter valve  reviewed benefits.   She discussed  tolerating diet,appetite improving, reinforced staying hydrated,  denies bowel or bladder problems.  Spouse/children are assisting with her  recovery.  She discussed condition of Pre Diabetes, Hyperlipidemia  denies any ongoing health issues and she is a agreeable to  a referral to one of the [place] chronic disease management programs.  Patient states that she has the hospital indemnity plan, provided [employer] contact number to file a claim and she has made contact regarding FMLA.  She  uses a [facility].  Patient voiced understanding of being in quarantine 16 days .  Patient discussed that it is her understanding that she will receive follow up from a  home from program Hospital to home mentioned to her at discharge.   Objective:   Mrs. Claus  was hospitalized at [facility], [facility]   from  7/25-02/16/20 Pneumonia, Covid 19 virus . Comorbidities include: Pre-Diabetes, Hypertension . She was discharged to home on 02/16/20 with home oxygen at 4 liters  and with Hospitalist at Home program.  Assessment:  Patient voices good understanding  of all discharge instructions.  See transition of care flowsheet for assessment details. Patient has not received contact from Hospitalist  at home program. Patient has virtual visit with PCP on 02/24/20.    Plan:  Reviewed hospital discharge diagnosis of Pneumonia, Covid 19   and discharge treatment plan using hospital discharge instructions, assessing medication adherence, reviewing problems requiring provider notification, and discussing the importance of follow up with  primary care provider and/or specialists as directed.  Reviewed [place] healthy lifestyle program information to receive discounted premium for  2022   Step 1: Get  your annual physical  Step 2: Complete your health assessment  Step 3:Identify your current health status and complete the corresponding action step between January 1, and March 17, 2020.    Using [place] website, with patient agreement enrolled to  participate in [place]'s [facility] chronic disease management program.   Plan Patient discussed that she  does not recall receiving call regarding Hospitalist  at home program included in discharge instructions. Placed call to Hospitalist program [phone] spoke with representative that does not see patient name on the list for program but will follow up , provided my contact number.  Placed return call to patient to explain, and will plan follow up to update in the next 2 business days.   Joylene Draft, RN, BSN  [place] Management Coordinator  [phone]- Mobile [phone]- Toll Free Main Office

## 2020-02-18 NOTE — Patient Outreach (Signed)
[facility] [facility]) Care Management  02/18/2020  Tinaya Ceballos [birth year] [id number]   Transition of care telephone call  Referral received:02/17/20 Initial outreach:02/18/20 Insurance: [place]  Initial unsuccessful telephone call to patient's preferred number in order to complete transition of care assessment; no answer, left HIPAA compliant voicemail message requesting return call.   Objective: Per the electronic medical record, Mrs. Mitch  was hospitalized at [facility]   from  7/25-02/16/20 Pneumonia, Covid 19 virus . Comorbidities include: Pre-Diabetes, Hypertension . She was discharged to home on 02/16/20 with home oxygen at 4 liters  and with Hospitalist at Home program.   Plan: This RNCM will route unsuccessful outreach letter with [facility] pamphlet and 24 hour Nurse Advice Line Magnet to [place] Management clinical pool to be mailed to patient's home address. This RNCM will attempt another outreach within 4 business days.  Joylene Draft, RN, BSN  [place] Management Coordinator  [phone]- Mobile [phone]- Toll Free Main Office

## 2020-02-18 NOTE — Telephone Encounter (Signed)
Transition Care Management Follow-up Telephone Call  [facility]   How have you been since you were released from the hospital? shes been ok Any questions or concerns? no Items Reviewed:  Did the pt receive and understand the discharge instructions provided?yes  Medications obtained and verified? yes  Any new allergies since your discharge? no  Dietary orders reviewed? no  Do you have support at home? yes  Other (ie: DME, Home Health, etc) not yet  Functional Questionnaire: (I = Independent and D = Dependent)  Bathing/Dressing- I    Meal Prep- I  Eating- I  Maintaining continence- I  Transferring/Ambulation- I  Managing Meds- I   Follow up appointments reviewed:    PCP Hospital f/u appt confirmed?  scheduled to see [name] DNP,FNP-BC on 02/24/20 @ 12pm  [facility] f/u appt confirmed?  no  Are transportation arrangements needed?no  If their condition worsens, is the pt aware to call  their PCP or go to the ED? yes  Was the patient provided with contact information for the PCP's office or ED? yes  Was the pt encouraged to call back with questions or concerns? yes

## 2020-02-20 ENCOUNTER — Other Ambulatory Visit: Payer: Self-pay | Admitting: *Deleted

## 2020-02-20 NOTE — Patient Outreach (Signed)
[facility]) Care Management  02/20/2020  Nichole Wilson Sep 26, 1960 [id number]   Transition of care follow up call   Referral received:02/17/20 Initial outreach:02/18/20 Insurance: [employer]  Subjective: Successful follow up call to patient , she discussed that she is trying to feel better each day. She denies increase in shortness of breath/cough, afebrile. She reports that her oxygen saturation are at 95 at rest and 90 range when standing, wearing oxygen at 4 liters .  Reinforced continued use of incentive spirometry, nutrition and fluid intake. Reinforced worsening symptoms to seek medical care for.  Patient reports having visit from Hospitalist at home program arranged at her discharge, she anticipates another visit on 8/7 and possibility of being able to decrease her oxygen .  Objective: [facility], Covid 19 virus. Comorbidities include: Pre-Diabetes, Hypertension . Shewas discharged to home on8/2/21 with home oxygen at 4 liters and with Hospitalist at Home program.   Plan: Will follow up call in the next week and case closure if no further care coordination needs identified.    Joylene Draft, RN, BSN  [place] Management Coordinator  [phone]- Mobile [phone]- Toll Free Main Office

## 2020-02-23 ENCOUNTER — Ambulatory Visit: Payer: Self-pay | Admitting: *Deleted

## 2020-02-24 ENCOUNTER — Telehealth: Payer: Self-pay

## 2020-02-24 ENCOUNTER — Encounter: Payer: Self-pay | Admitting: Nurse Practitioner

## 2020-02-24 ENCOUNTER — Other Ambulatory Visit: Payer: Self-pay

## 2020-02-24 ENCOUNTER — Telehealth (INDEPENDENT_AMBULATORY_CARE_PROVIDER_SITE_OTHER): Payer: No Typology Code available for payment source | Admitting: Nurse Practitioner

## 2020-02-24 DIAGNOSIS — U071 COVID-19: Secondary | ICD-10-CM

## 2020-02-24 DIAGNOSIS — J1282 Pneumonia due to coronavirus disease 2019: Secondary | ICD-10-CM

## 2020-02-24 NOTE — Progress Notes (Signed)
Virtual Visit via Telephone - unable to log on to mychart or via text   This visit type was conducted due to national recommendations for restrictions regarding the COVID-19 Pandemic (e.g. social distancing) in an effort to limit this patient's exposure and mitigate transmission in our community.  Due to her co-morbid illnesses, this patient is at least at moderate risk for complications without adequate follow up.  This format is felt to be most appropriate for this patient at this time.  All issues noted in this document were discussed and addressed.  A limited physical exam was performed with this format.    This visit type was conducted due to national recommendations for restrictions regarding the COVID-19 Pandemic (e.g. social distancing) in an effort to limit this patient's exposure and mitigate transmission in our community.  Patients identity confirmed using two different identifiers.  This format is felt to be most appropriate for this patient at this time.  All issues noted in this document were discussed and addressed.  No physical exam was performed (except for noted visual exam findings with Video Visits).    Date:  02/25/2020   ID:  Nichole Wilson, DOB 10/06/1960, MRN [id number]  Patient Location:  Home - spoke with Nichole Wilson  Provider location:   Office    Chief Complaint:  Hospital follow up for covid pneumonia  History of Present Illness:    Nichole Wilson is a 59 y.o. female who presents via video conferencing for a telehealth visit today.    The patient does have symptoms concerning for COVID-19 infection (fever, chills, cough, or new shortness of breath).   She was having shortness of breath, fever and sweating. Went to [facility] admitted July 25th - Aug 2nd.  Diagnosed with covid pneumonia she is on 4 l/m. She has been seen by a nurse and had a virtual visit. She does not need PT/OT.  She is using an incentive spirometer.  She is currently at home.   She does have FMLA forms completed until August 16th. She does not have a history of asthma.  She was vaccinated in April.  She works at the [employer] with [employer].  Since being home she is getting better, doing more on her own.  She is using er oxygen all day, she did try to take off her oxygen but was having shortness of breath.  She is not requiring PT/OT.       Past Medical History:  Diagnosis Date  . Abnormal LFTs 10/31/2018  . ANA positive 08/06/2017  . Anal fissure - posterior 11/26/2015  . Anemia   . Anterior scleritis of both eyes 05/12/2015   Followed by Dr Teola Bradley - Last OV 11/10, 11/18 - Update 11/18 - dx Acute toxic conjunctivitis > improved s/p prednisolone - Primary iridocyclitis (R-eye) - begin taper of prednisolone eye drops, much improved - Requested labs all resulted 11/16: ESR (21), ANA (neg), RPR (non-reactive, no FTA-Ab) >> faxed results back to Dr Sharen Counter   . Blood transfusion without reported diagnosis   . Breast cancer ([place]) 2017   Right Breast Cancer  . Breast cancer of upper-outer quadrant of right female breast ([place]) 08/26/2015  . Breast cyst, left 01-04-12   cyst no longer apparent  . Cholelithiasis with choledocholithiasis   . Chronic calculus cholecystitis 12/25/2011   Lap chole on 03/25/12   . Complication of anesthesia 2002   PT STATES DURING HER C-SECTION SHE WAS AWARE OF INCISION AND PAIN -  THEN WAS QUICKLY "PUT OUT"  . Elevated alkaline phosphatase level 08/07/2018   Normal Alk Phos Isoenzyme  . Essential hypertension 01/04/2016  . Family history of adverse reaction to anesthesia    father became aggressive  . Fatigue 07/22/2015  . Gallstone pancreatitis   . Gastric mass GIST vs leiomyoma 12/01/2015  . Glucose intolerance (impaired glucose tolerance)    since at least 2011.   [name] Health care maintenance 03/24/2013  . Hepatic steatosis   . Hx of adenomatous polyp of colon 12/12/2018  . Hypercholesterolemia with hypertriglyceridemia   . Hypertension  01-04-12   med d/c-last taken 12-07-11  -PT [place] STOPPED HER B/P MEDICATION BECAUSE HER B/P TOO LOW AND PT STATES HER B/P'S HAVE BEEN OK SINCE.  [name] Hypertriglyceridemia   . Iron deficiency anemia 10/25/2018  . Migraine without aura, without mention of intractable migraine without mention of status migrainosus   . Overweight (BMI 25.0-29.9) 05/12/2015  . Plantar fasciitis 09/05/2013  . PONV (postoperative nausea and vomiting)    patient states that she did not get sick with mastectomy  . Pre-diabetes   . Sciatica 05/17/2012   Patient with left leg radicular lumbar pain.  No concerning symptoms aside from pain. Plan: Trial of steroid dosepak and tramadol as needed for pain Discussed warning signs or symptoms. Please see discharge instructions. Patient expresses understanding. F/u 2 weeks if not improved.    . Type 2 diabetes mellitus with complication, without long-term current use of insulin ([place]) 05/12/2015   Past Surgical History:  Procedure Laterality Date  . CESAREAN SECTION     2002  . CHOLECYSTECTOMY  03/25/2012   Procedure: LAPAROSCOPIC CHOLECYSTECTOMY WITH INTRAOPERATIVE CHOLANGIOGRAM;  Surgeon: Haywood Lasso, MD;  Location: [facility];  Service: General;  Laterality: N/A;  Laparoscopic Cholecystectomy with Intraoperative Cholangiogram  . COLONOSCOPY    . ERCP  12/05/2011   Procedure: ENDOSCOPIC RETROGRADE CHOLANGIOPANCREATOGRAPHY (ERCP);  Surgeon: Inda Castle, MD;  Location: [facility];  Service: Endoscopy;  Laterality: N/A;  . ERCP  12/08/2011   Procedure: ENDOSCOPIC RETROGRADE CHOLANGIOPANCREATOGRAPHY (ERCP);  Surgeon: Inda Castle, MD;  Location: [place];  Service: Gastroenterology;  Laterality: N/A;  . ESOPHAGOGASTRODUODENOSCOPY (EGD) WITH PROPOFOL N/A 09/28/2015   Procedure: ESOPHAGOGASTRODUODENOSCOPY (EGD) WITH PROPOFOL;  Surgeon: Clarene Essex, MD;  Location: [facility];  Service: Endoscopy;  Laterality: N/A;  H AND P IN DRAWER  . EUS N/A 10/13/2015    Procedure: UPPER ENDOSCOPIC ULTRASOUND (EUS) RADIAL;  Surgeon: Arta Silence, MD;  Location: [facility];  Service: Endoscopy;  Laterality: N/A;  . LAPAROSCOPIC PARTIAL GASTRECTOMY N/A 01/11/2016   Procedure: LAPAROSCOPIC PARTIAL GASTRECTOMY;  Surgeon: [name], MD;  Location: [place];  Service: General;  Laterality: N/A;  . MASTECTOMY Right 09/2015  . MASTECTOMY W/ SENTINEL NODE BIOPSY Right 09/15/2015   Procedure: RIGHT MASTECTOMY WITH SENTINEL LYMPH NODE BIOPSY;  Surgeon: Rolm Bookbinder, MD;  Location: [place];  Service: General;  Laterality: Right;  . UPPER GI ENDOSCOPY N/A 01/11/2016   Procedure: UPPER GI ENDOSCOPY;  Surgeon: [name], MD;  Location: [place];  Service: General;  Laterality: N/A;     Current Meds  Medication Sig  . Ascorbic Acid (VITAMIN C) 1000 MG tablet Take 1,000 mg by mouth daily.  [name] atorvastatin (LIPITOR) 20 MG tablet Take 1 tablet (20 mg total) by mouth daily.  . Cholecalciferol (VITAMIN D3) 10000 units capsule Take 10,000 Units by mouth daily.  . fenofibrate (TRICOR) 145 MG tablet Take 1 tablet (145 mg total)  by mouth daily.  [name] Ethyl (VASCEPA) 1 g CAPS Take 2 capsules (2 g total) by mouth 2 (two) times daily. Started 02/17/2019  . pantoprazole (PROTONIX) 40 MG tablet TAKE 1 TABLET BY MOUTH ONCE A DAY BEFORE BREAKFAST  . pyridOXINE (VITAMIN B-6) 100 MG tablet Take 100 mg by mouth daily.  . RESTASIS 0.05 % ophthalmic emulsion   . Thiamine HCl (VITAMIN B-1) 250 MG tablet Take 250 mg by mouth daily.  . vitamin B-12 (CYANOCOBALAMIN) 1000 MCG tablet Take 1,000 mcg by mouth daily.  . vitamin E 1000 UNIT capsule Take 1,000 Units by mouth daily.  [name] zinc gluconate 50 MG tablet Take 50 mg by mouth daily.     Allergies:   Lisinopril and Other   Social History   Tobacco Use  . Smoking status: Never Smoker  . Smokeless tobacco: Never Used  Vaping Use  . Vaping Use: Never used  Substance Use Topics  . Alcohol use: No  . Drug use: No     Family Hx: The  patient's family history includes Diabetes in her brother, father, and mother; Heart disease in her paternal uncle; Liver disease in her brother. There is no history of Colon cancer, Esophageal cancer, Rectal cancer, or Stomach cancer.  ROS:   Please see the history of present illness.    Review of Systems  Constitutional: Negative.   Respiratory: Positive for cough and shortness of breath.        Wearing oxygen  Cardiovascular: Negative.   Neurological: Negative for dizziness, tingling and headaches.  Psychiatric/Behavioral: Negative.     All other systems reviewed and are negative.   Labs/Other Tests and Data Reviewed:    Recent Labs: 12/04/2019: ALT 17; BUN 15; Creatinine, Ser 0.64; Hemoglobin 12.4; Platelets 345; Potassium 4.2; Sodium 140; TSH 1.170   Recent Lipid Panel Lab Results  Component Value Date/Time   CHOL 166 12/04/2019 09:36 AM   TRIG 93 12/04/2019 09:36 AM   HDL 63 12/04/2019 09:36 AM   CHOLHDL 2.6 12/04/2019 09:36 AM   CHOLHDL 5 05/15/2018 09:38 AM   LDLCALC 86 12/04/2019 09:36 AM   LDLDIRECT 132.0 05/15/2018 09:38 AM    Wt Readings from Last 3 Encounters:  01/08/20 144 lb (65.3 kg)  12/17/19 143 lb (64.9 kg)  12/04/19 144 lb 12.8 oz (65.7 kg)     Exam:    Vital Signs:  LMP 11/29/2011     Physical Exam Constitutional:      General: She is not in acute distress. Pulmonary:     Effort: Pulmonary effort is normal. No respiratory distress.     Comments: She was able to speak in complete sentences during the telephone visit Neurological:     Mental Status: She is alert.  Psychiatric:        Mood and Affect: Mood normal.        Behavior: Behavior normal.        Thought Content: Thought content normal.        Judgment: Judgment normal.     ASSESSMENT & PLAN:    1. Pneumonia due to COVID-19 virus TCM Performed. A member of the clinical team spoke with the patient upon dischare. Discharge summary was reviewed in full detail during the visit. Meds  reconciled and compared to discharge meds. Medication list is updated and reviewed with the patient.  Greater than 50% face to face time was spent in counseling an coordination of care.  All questions were answered to the satisfaction of  the patient.   I advised her to keep her oxygen on at all times and will have Remote Health to make a visit at home to help determine when can start weaning off Will obtain a repeat xray in 4 weeks. Advised to listen to her body if she is tired to rest Unable to visualize due to her not being able to log in to Mychart or use the text option. - DG Chest 2 View; Future   COVID-19 Education: The signs and symptoms of COVID-19 were discussed with the patient and how to seek care for testing (follow up with PCP or arrange E-visit).  The importance of social distancing was discussed today.  Patient Risk:   After full review of this patients clinical status, I feel that they are at least moderate risk at this time.  Time:   Today, I have spent 12 minutes/ seconds with the patient with telehealth technology discussing above diagnoses.     Medication Adjustments/Labs and Tests Ordered: Current medicines are reviewed at length with the patient today.  Concerns regarding medicines are outlined above.   Tests Ordered: Orders Placed This Encounter  Procedures  . DG Chest 2 View    Medication Changes: No orders of the defined types were placed in this encounter.   Disposition:  Follow up prn  Signed, [name], FNP

## 2020-02-24 NOTE — Progress Notes (Deleted)
This visit occurred during the SARS-CoV-2 public health emergency.  Safety protocols were in place, including screening questions prior to the visit, additional usage of staff PPE, and extensive cleaning of exam room while observing appropriate contact time as indicated for disinfecting solutions.  Subjective:     Patient ID: Nichole Wilson , female    DOB: May 26, 1961 , 59 y.o.   MRN: [id number]   Chief Complaint  Patient presents with  . Hospitalization Follow-up    patient stated she is     HPI  HPI   Past Medical History:  Diagnosis Date  . Abnormal LFTs 10/31/2018  . ANA positive 08/06/2017  . Anal fissure - posterior 11/26/2015  . Anemia   . Anterior scleritis of both eyes 05/12/2015   Followed by Dr Teola Bradley - Last OV 11/10, 11/18 - Update 11/18 - dx Acute toxic conjunctivitis > improved s/p prednisolone - Primary iridocyclitis (R-eye) - begin taper of prednisolone eye drops, much improved - Requested labs all resulted 11/16: ESR (21), ANA (neg), RPR (non-reactive, no FTA-Ab) >> faxed results back to Dr Sharen Counter   . Blood transfusion without reported diagnosis   . Breast cancer ([place]) 2017   Right Breast Cancer  . Breast cancer of upper-outer quadrant of right female breast ([place]) 08/26/2015  . Breast cyst, left 01-04-12   cyst no longer apparent  . Cholelithiasis with choledocholithiasis   . Chronic calculus cholecystitis 12/25/2011   Lap chole on 03/25/12   . Complication of anesthesia 2002   PT STATES DURING HER C-SECTION SHE WAS AWARE OF INCISION AND PAIN -THEN WAS QUICKLY "PUT OUT"  . Elevated alkaline phosphatase level 08/07/2018   Normal Alk Phos Isoenzyme  . Essential hypertension 01/04/2016  . Family history of adverse reaction to anesthesia    father became aggressive  . Fatigue 07/22/2015  . Gallstone pancreatitis   . Gastric mass GIST vs leiomyoma 12/01/2015  . Glucose intolerance (impaired glucose tolerance)    since at least 2011.   [name] Health care maintenance 03/24/2013   . Hepatic steatosis   . Hx of adenomatous polyp of colon 12/12/2018  . Hypercholesterolemia with hypertriglyceridemia   . Hypertension 01-04-12   med d/c-last taken 12-07-11  -PT [place] STOPPED HER B/P MEDICATION BECAUSE HER B/P TOO LOW AND PT STATES HER B/P'S HAVE BEEN OK SINCE.  [name] Hypertriglyceridemia   . Iron deficiency anemia 10/25/2018  . Migraine without aura, without mention of intractable migraine without mention of status migrainosus   . Overweight (BMI 25.0-29.9) 05/12/2015  . Plantar fasciitis 09/05/2013  . PONV (postoperative nausea and vomiting)    patient states that she did not get sick with mastectomy  . Pre-diabetes   . Sciatica 05/17/2012   Patient with left leg radicular lumbar pain.  No concerning symptoms aside from pain. Plan: Trial of steroid dosepak and tramadol as needed for pain Discussed warning signs or symptoms. Please see discharge instructions. Patient expresses understanding. F/u 2 weeks if not improved.    . Type 2 diabetes mellitus with complication, without long-term current use of insulin ([place]) 05/12/2015     Family History  Problem Relation Age of Onset  . Diabetes Mother   . Diabetes Father   . Diabetes Brother   . Heart disease Paternal Uncle   . Liver disease Brother   . Colon cancer Neg Hx   . Esophageal cancer Neg Hx   . Rectal cancer Neg Hx   . Stomach cancer Neg Hx  Current Outpatient Medications:  .  Ascorbic Acid (VITAMIN C) 1000 MG tablet, Take 1,000 mg by mouth daily., Disp: , Rfl:  .  atorvastatin (LIPITOR) 20 MG tablet, Take 1 tablet (20 mg total) by mouth daily., Disp: 90 tablet, Rfl: 3 .  Cholecalciferol (VITAMIN D3) 10000 units capsule, Take 10,000 Units by mouth daily., Disp: , Rfl:  .  fenofibrate (TRICOR) 145 MG tablet, Take 1 tablet (145 mg total) by mouth daily., Disp: 90 tablet, Rfl: 3 .  Icosapent Ethyl (VASCEPA) 1 g CAPS, Take 2 capsules (2 g total) by mouth 2 (two) times daily. Started 02/17/2019,  Disp: 360 capsule, Rfl: 1 .  pantoprazole (PROTONIX) 40 MG tablet, TAKE 1 TABLET BY MOUTH ONCE A DAY BEFORE BREAKFAST, Disp: 90 tablet, Rfl: 3 .  pyridOXINE (VITAMIN B-6) 100 MG tablet, Take 100 mg by mouth daily., Disp: , Rfl:  .  RESTASIS 0.05 % ophthalmic emulsion, , Disp: , Rfl: 2 .  Thiamine HCl (VITAMIN B-1) 250 MG tablet, Take 250 mg by mouth daily., Disp: , Rfl:  .  vitamin B-12 (CYANOCOBALAMIN) 1000 MCG tablet, Take 1,000 mcg by mouth daily., Disp: , Rfl:  .  vitamin E 1000 UNIT capsule, Take 1,000 Units by mouth daily., Disp: , Rfl:  .  zinc gluconate 50 MG tablet, Take 50 mg by mouth daily., Disp: , Rfl:    Allergies  Allergen Reactions  . Lisinopril Swelling  . Other     General anesthesia-severe vomiting     Review of Systems  Constitutional: Negative.   Respiratory: Positive for shortness of breath. Negative for cough.   Cardiovascular: Negative.   Gastrointestinal: Negative.   Skin: Negative.   Psychiatric/Behavioral: Negative.      Today's Vitals   There is no height or weight on file to calculate BMI.   Objective:  Physical Exam      Assessment And Plan:     There are no diagnoses linked to this encounter.    Patient was given opportunity to ask questions. Patient verbalized understanding of the plan and was able to repeat key elements of the plan. All questions were answered to their satisfaction.  [address] [place], CMA   I, [place], [place], have reviewed all documentation for this visit. The documentation on 02/24/20 for the exam, diagnosis, procedures, and orders are all accurate and complete.  THE PATIENT IS ENCOURAGED TO PRACTICE SOCIAL DISTANCING DUE TO THE COVID-19 PANDEMIC.

## 2020-02-24 NOTE — Telephone Encounter (Signed)
Patient consented to virtual appointment. YL,RMA 

## 2020-02-25 ENCOUNTER — Other Ambulatory Visit: Payer: Self-pay | Admitting: *Deleted

## 2020-02-25 ENCOUNTER — Encounter: Payer: Self-pay | Admitting: Nurse Practitioner

## 2020-02-25 NOTE — Patient Outreach (Addendum)
[facility]) Care Management  02/25/2020  Nichole Wilson 22-Nov-1960 [id number]   Transition of care follow up call   Referral received:02/17/20 Initial outreach:02/18/20 Insurance: [employer]  Subjective: Unsuccessful follow up call to patient , no answer able to leave a HIPAA compliant message for return call.    Objective: [facility] Pneumonia, Covid 19 virus. Comorbidities include: Pre-Diabetes, Hypertension . Shewas discharged to home on8/2/21 with home oxygen at 4 liters and with Hospitalist at [facility] Will plan return call in the next week, if no return response on today.    2 Incoming return call from patient , she reports that she is doing better on today. She discussed visit from Hospitalist at home program and receiving a IV fluids.  She discussed that was probably her final visit. She discussed that her oxygen is now at 3 liters and saturations staying at 93-95 % and when up at 92% . She denies having temperature, worsening cough.. She continues to work on breathing exercises and improved activity tolerance. Patient reports that her appetite is slowly improving, reinforced staying hydrated.   Almyra Free has had virtual visit with PCP and plans for follow up cxr in next 2 weeks. Reinforced worsening symptoms to seek medical care for.  Patient denies any other concerns at this time.  Plan  Will close case to [facility] care management .    Joylene Draft, RN, BSN  [place] Management Coordinator  [phone]- Mobile [phone]- Toll Free Main Office

## 2020-03-04 ENCOUNTER — Encounter: Payer: No Typology Code available for payment source | Admitting: Nurse Practitioner

## 2020-03-04 ENCOUNTER — Encounter: Payer: No Typology Code available for payment source | Admitting: Internal Medicine

## 2020-03-09 ENCOUNTER — Other Ambulatory Visit: Payer: Self-pay

## 2020-03-09 NOTE — Telephone Encounter (Signed)
Pt consent to MYCHART visit 

## 2020-03-10 ENCOUNTER — Encounter: Payer: Self-pay | Admitting: Nurse Practitioner

## 2020-03-10 ENCOUNTER — Telehealth (INDEPENDENT_AMBULATORY_CARE_PROVIDER_SITE_OTHER): Payer: No Typology Code available for payment source | Admitting: Nurse Practitioner

## 2020-03-10 ENCOUNTER — Ambulatory Visit (HOSPITAL_BASED_OUTPATIENT_CLINIC_OR_DEPARTMENT_OTHER)
Admission: RE | Admit: 2020-03-10 | Discharge: 2020-03-10 | Disposition: A | Discharge status: Home / Self Care | Payer: No Typology Code available for payment source | Source: Ambulatory Visit | Attending: Nurse Practitioner | Admitting: Nurse Practitioner

## 2020-03-10 ENCOUNTER — Other Ambulatory Visit: Payer: Self-pay

## 2020-03-10 VITALS — BP 126/90 | HR 102 | Temp 95.5°F | Wt 136.0 lb

## 2020-03-10 DIAGNOSIS — W19XXXA Unspecified fall, initial encounter: Secondary | ICD-10-CM | POA: Diagnosis not present

## 2020-03-10 DIAGNOSIS — R079 Chest pain, unspecified: Secondary | ICD-10-CM | POA: Diagnosis present

## 2020-03-10 DIAGNOSIS — Z8701 Personal history of pneumonia (recurrent): Secondary | ICD-10-CM | POA: Insufficient documentation

## 2020-03-10 DIAGNOSIS — Z8616 Personal history of COVID-19: Secondary | ICD-10-CM | POA: Diagnosis not present

## 2020-03-10 DIAGNOSIS — R0781 Pleurodynia: Secondary | ICD-10-CM

## 2020-03-10 NOTE — Progress Notes (Signed)
Virtual Visit via MyChart   This visit type was conducted due to national recommendations for restrictions regarding the COVID-19 Pandemic (e.g. social distancing) in an effort to limit this patient's exposure and mitigate transmission in our community.  Due to her co-morbid illnesses, this patient is at least at moderate risk for complications without adequate follow up.  This format is felt to be most appropriate for this patient at this time.  All issues noted in this document were discussed and addressed.  A limited physical exam was performed with this format.    This visit type was conducted due to national recommendations for restrictions regarding the COVID-19 Pandemic (e.g. social distancing) in an effort to limit this patient's exposure and mitigate transmission in our community.  Patients identity confirmed using two different identifiers.  This format is felt to be most appropriate for this patient at this time.  All issues noted in this document were discussed and addressed.  No physical exam was performed (except for noted visual exam findings with Video Visits).    Date:  03/10/2020   ID:  Nichole Wilson, DOB 10/28/1960, MRN [id number]  Patient Location:  Home - spoke with [name]  Provider location:   Office    Chief Complaint:  Fall   History of Present Illness:    Nichole Wilson is a 59 y.o. female who presents via video conferencing for a telehealth visit today.    The patient does have symptoms concerning for COVID-19 infection (fever, chills, cough, or new shortness of breath).   Virtual visit after having a fall while walking holding the crucifix which also injured her hand but did not puncture the skin.  She fell on both knees, right elbow and right side of ribs.  Feels sometimes a "fluid area" near her ribs.    Fall The accident occurred more than 1 week ago. The fall occurred while walking (she tripped and fell on her knees). The point of impact was the right  elbow, right knee and left hip (right rib). Pain location: right ribs. The pain is at a severity of 7/10. The pain is moderate. The symptoms are aggravated by movement (or lay down on the right side). Pertinent negatives include no headaches, loss of consciousness, nausea, numbness, tingling or vomiting. Associated symptoms comments: Denies additional shortness of breath. She has tried nothing for the symptoms.     Past Medical History:  Diagnosis Date  . Abnormal LFTs 10/31/2018  . ANA positive 08/06/2017  . Anal fissure - posterior 11/26/2015  . Anemia   . Anterior scleritis of both eyes 05/12/2015   Followed by Dr Teola Bradley - Last OV 11/10, 11/18 - Update 11/18 - dx Acute toxic conjunctivitis > improved s/p prednisolone - Primary iridocyclitis (R-eye) - begin taper of prednisolone eye drops, much improved - Requested labs all resulted 11/16: ESR (21), ANA (neg), RPR (non-reactive, no FTA-Ab) >> faxed results back to Dr Sharen Counter   . Blood transfusion without reported diagnosis   . Breast cancer ([place]) 2017   Right Breast Cancer  . Breast cancer of upper-outer quadrant of right female breast ([place]) 08/26/2015  . Breast cyst, left 01-04-12   cyst no longer apparent  . Cholelithiasis with choledocholithiasis   . Chronic calculus cholecystitis 12/25/2011   Lap chole on 03/25/12   . Complication of anesthesia 2002   PT STATES DURING HER C-SECTION SHE WAS AWARE OF INCISION AND PAIN -THEN WAS QUICKLY "PUT OUT"  . Elevated alkaline phosphatase level 08/07/2018  Normal Alk Phos Isoenzyme  . Essential hypertension 01/04/2016  . Family history of adverse reaction to anesthesia    father became aggressive  . Fatigue 07/22/2015  . Gallstone pancreatitis   . Gastric mass GIST vs leiomyoma 12/01/2015  . Glucose intolerance (impaired glucose tolerance)    since at least 2011.   [name] Health care maintenance 03/24/2013  . Hepatic steatosis   . Hx of adenomatous polyp of colon 12/12/2018  . Hypercholesterolemia with  hypertriglyceridemia   . Hypertension 01-04-12   med d/c-last taken 12-07-11  -PT [place] STOPPED HER B/P MEDICATION BECAUSE HER B/P TOO LOW AND PT STATES HER B/P'S HAVE BEEN OK SINCE.  [name] Hypertriglyceridemia   . Iron deficiency anemia 10/25/2018  . Migraine without aura, without mention of intractable migraine without mention of status migrainosus   . Overweight (BMI 25.0-29.9) 05/12/2015  . Plantar fasciitis 09/05/2013  . PONV (postoperative nausea and vomiting)    patient states that she did not get sick with mastectomy  . Pre-diabetes   . Sciatica 05/17/2012   Patient with left leg radicular lumbar pain.  No concerning symptoms aside from pain. Plan: Trial of steroid dosepak and tramadol as needed for pain Discussed warning signs or symptoms. Please see discharge instructions. Patient expresses understanding. F/u 2 weeks if not improved.    . Type 2 diabetes mellitus with complication, without long-term current use of insulin ([place]) 05/12/2015   Past Surgical History:  Procedure Laterality Date  . CESAREAN SECTION     2002  . CHOLECYSTECTOMY  03/25/2012   Procedure: LAPAROSCOPIC CHOLECYSTECTOMY WITH INTRAOPERATIVE CHOLANGIOGRAM;  Surgeon: Haywood Lasso, MD;  Location: [facility];  Service: General;  Laterality: N/A;  Laparoscopic Cholecystectomy with Intraoperative Cholangiogram  . COLONOSCOPY    . ERCP  12/05/2011   Procedure: ENDOSCOPIC RETROGRADE CHOLANGIOPANCREATOGRAPHY (ERCP);  Surgeon: Inda Castle, MD;  Location: [facility];  Service: Endoscopy;  Laterality: N/A;  . ERCP  12/08/2011   Procedure: ENDOSCOPIC RETROGRADE CHOLANGIOPANCREATOGRAPHY (ERCP);  Surgeon: Inda Castle, MD;  Location: [facility];  Service: Gastroenterology;  Laterality: N/A;  . ESOPHAGOGASTRODUODENOSCOPY (EGD) WITH PROPOFOL N/A 09/28/2015   Procedure: ESOPHAGOGASTRODUODENOSCOPY (EGD) WITH PROPOFOL;  Surgeon: Clarene Essex, MD;  Location: [facility];  Service: Endoscopy;  Laterality: N/A;  H AND P IN  [place]  . EUS N/A 10/13/2015   Procedure: UPPER ENDOSCOPIC ULTRASOUND (EUS) RADIAL;  Surgeon: Arta Silence, MD;  Location: [facility];  Service: Endoscopy;  Laterality: N/A;  . LAPAROSCOPIC PARTIAL GASTRECTOMY N/A 01/11/2016   Procedure: LAPAROSCOPIC PARTIAL GASTRECTOMY;  Surgeon: [name], MD;  Location: [place];  Service: General;  Laterality: N/A;  . MASTECTOMY Right 09/2015  . MASTECTOMY W/ SENTINEL NODE BIOPSY Right 09/15/2015   Procedure: RIGHT MASTECTOMY WITH SENTINEL LYMPH NODE BIOPSY;  Surgeon: Rolm Bookbinder, MD;  Location: [place];  Service: General;  Laterality: Right;  . UPPER GI ENDOSCOPY N/A 01/11/2016   Procedure: UPPER GI ENDOSCOPY;  Surgeon: [name], MD;  Location: [place];  Service: General;  Laterality: N/A;     Current Meds  Medication Sig  . Ascorbic Acid (VITAMIN C) 1000 MG tablet Take 1,000 mg by mouth daily.  [name] atorvastatin (LIPITOR) 20 MG tablet Take 1 tablet (20 mg total) by mouth daily.  . Cholecalciferol (VITAMIN D3) 10000 units capsule Take 10,000 Units by mouth daily.  . fenofibrate (TRICOR) 145 MG tablet Take 1 tablet (145 mg total) by mouth daily.  [name] Ethyl (VASCEPA) 1 g CAPS Take 2 capsules (  2 g total) by mouth 2 (two) times daily. Started 02/17/2019  . pantoprazole (PROTONIX) 40 MG tablet TAKE 1 TABLET BY MOUTH ONCE A DAY BEFORE BREAKFAST  . pyridOXINE (VITAMIN B-6) 100 MG tablet Take 100 mg by mouth daily.  . RESTASIS 0.05 % ophthalmic emulsion   . Thiamine HCl (VITAMIN B-1) 250 MG tablet Take 250 mg by mouth daily.  . vitamin B-12 (CYANOCOBALAMIN) 1000 MCG tablet Take 1,000 mcg by mouth daily.  . vitamin E 1000 UNIT capsule Take 1,000 Units by mouth daily.  [name] zinc gluconate 50 MG tablet Take 50 mg by mouth daily.     Allergies:   Lisinopril and Other   Social History   Tobacco Use  . Smoking status: Never Smoker  . Smokeless tobacco: Never Used  Vaping Use  . Vaping Use: Never used  Substance Use Topics  . Alcohol use: No  . Drug  use: No     Family Hx: The patient's family history includes Diabetes in her brother, father, and mother; Heart disease in her paternal uncle; Liver disease in her brother. There is no history of Colon cancer, Esophageal cancer, Rectal cancer, or Stomach cancer.  ROS:   Please see the history of present illness.    Review of Systems  Gastrointestinal: Negative for nausea and vomiting.  Neurological: Negative for tingling, loss of consciousness, numbness and headaches.    All other systems reviewed and are negative.   Labs/Other Tests and Data Reviewed:    Recent Labs: 12/04/2019: ALT 17; BUN 15; Creatinine, Ser 0.64; Hemoglobin 12.4; Platelets 345; Potassium 4.2; Sodium 140; TSH 1.170   Recent Lipid Panel Lab Results  Component Value Date/Time   CHOL 166 12/04/2019 09:36 AM   TRIG 93 12/04/2019 09:36 AM   HDL 63 12/04/2019 09:36 AM   CHOLHDL 2.6 12/04/2019 09:36 AM   CHOLHDL 5 05/15/2018 09:38 AM   LDLCALC 86 12/04/2019 09:36 AM   LDLDIRECT 132.0 05/15/2018 09:38 AM    Wt Readings from Last 3 Encounters:  03/10/20 136 lb (61.7 kg)  01/08/20 144 lb (65.3 kg)  12/17/19 143 lb (64.9 kg)     Exam:    Vital Signs:  BP 126/90 Comment: Pt provided  Pulse (!) 102 Comment: pt provided  Temp (!) 95.5 F (35.3 C) Comment: pt provided  Wt 136 lb (61.7 kg) Comment: pt provided  LMP 11/29/2011   SpO2 95% Comment: pt provided  BMI 27.28 kg/m     Physical Exam Vitals reviewed.  Constitutional:      General: She is not in acute distress.    Appearance: Normal appearance.  Pulmonary:     Effort: Pulmonary effort is normal. No respiratory distress.  Skin:    Comments: Bruising noted on right elbow. Otherwise no other bruises.  Neurological:     General: No focal deficit present.     Mental Status: She is alert and oriented to person, place, and time.  Psychiatric:        Mood and Affect: Mood normal.        Behavior: Behavior normal.        Thought Content: Thought  content normal.        Judgment: Judgment normal.     ASSESSMENT & PLAN:    1. Fall, initial encounter  Fall after walking on 8/19 - DG Ribs Unilateral Right; Future  2. Rib pain on right side  No bruising noted via video, when she touches the area she has tenderness  Will  check rib xray  3. History of COVID-19  - DG Chest 2 View; Future  4. History of pneumonia  Will recheck her chest xray to see if clear from pneumonia.  - DG Chest 2 View; Future  COVID-19 Education: The signs and symptoms of COVID-19 were discussed with the patient and how to seek care for testing (follow up with PCP or arrange E-visit).  The importance of social distancing was discussed today.  Patient Risk:   After full review of this patients clinical status, I feel that they are at least moderate risk at this time.  Time:   Today, I have spent 11 minutes/ seconds with the patient with telehealth technology discussing above diagnoses.     Medication Adjustments/Labs and Tests Ordered: Current medicines are reviewed at length with the patient today.  Concerns regarding medicines are outlined above.   Tests Ordered: Orders Placed This Encounter  Procedures  . DG Ribs Unilateral Right  . DG Chest 2 View    Medication Changes: No orders of the defined types were placed in this encounter.   Disposition:  Follow up prn  Signed, [name], FNP

## 2020-03-10 NOTE — Patient Instructions (Signed)
Rib Contusion A rib contusion is a deep bruise on your rib area. Contusions are the result of a blunt trauma that causes bleeding and injury to the tissues under the skin. A rib contusion may involve bruising of the ribs and of the skin and muscles in the area. The skin over the contusion may turn blue, purple, or yellow. Minor injuries will give you a painless contusion. More severe contusions may stay painful and swollen for a few weeks. What are the causes? This condition is usually caused by a blow, trauma, or direct force to an area of the body. This often occurs while playing contact sports. What are the signs or symptoms? Symptoms of this condition include:  Swelling and redness of the injured area.  Discoloration of the injured area.  Tenderness and soreness of the injured area.  Pain with or without movement. How is this diagnosed? This condition may be diagnosed based on:  Your symptoms and medical history.  A physical exam.  Imaging tests--such as an X-ray, CT scan, or MRI--to determine if there were internal injuries or broken bones (fractures). How is this treated? This condition may be treated with:  Rest. This is often the best treatment for a rib contusion.  Icing. This reduces swelling and inflammation.  Deep-breathing exercises. These may be recommended to reduce the risk for lung collapse and pneumonia.  Medicines. Over-the-counter or prescription medicines may be given to control pain.  Injection of a numbing medicine around the nerve near your injury (nerve block). Follow these instructions at home:     Medicines  Take over-the-counter and prescription medicines only as told by your health care provider.  Do not drive or use heavy machinery while taking prescription pain medicine.  If you are taking prescription pain medicine, take actions to prevent or treat constipation. Your health care provider may recommend that you: ? Drink enough fluid to keep  your urine pale yellow. ? Eat foods that are high in fiber, such as fresh fruits and vegetables, whole grains, and beans. ? Limit foods that are high in fat and processed sugars, such as fried or sweet foods. ? Take an over-the-counter or prescription medicine for constipation. Managing pain, stiffness, and swelling  If directed, put ice on the injured area: ? Put ice in a plastic bag. ? Place a towel between your skin and the bag. ? Leave the ice on for 20 minutes, 2-3 times a day.  Rest the injured area. Avoid strenuous activity and any activities or movements that cause pain. Be careful during activities and avoid bumping the injured area.  Do not lift anything that is heavier than 5 lb (2.3 kg), or the limit that you are told, until your health care provider says that it is safe. General instructions  Do not use any products that contain nicotine or tobacco, such as cigarettes and e-cigarettes. These can delay healing. If you need help quitting, ask your health care provider.  Do deep-breathing exercises as told by your health care provider.  If you were given an incentive spirometer, use it every 1-2 hours while you are awake, or as recommended by your health care provider. This device measures how well you are filling your lungs with each breath.  Keep all follow-up visits as told by your health care provider. This is important. Contact a health care provider if you have:  Increased bruising or swelling.  Pain that is not controlled with treatment.  A fever. Get help right away if   you:  Have difficulty breathing or shortness of breath.  Develop a continual cough or you cough up thick or bloody sputum.  Feel nauseous or you vomit.  Have pain in your abdomen. Summary  A rib contusion is a deep bruise on your rib area. Contusions are the result of a blunt trauma that causes bleeding and injury to the tissues under the skin.  The skin overlying the contusion may turn  blue, purple, or yellow. Minor injuries may give you a painless contusion. More severe contusions may stay painful and swollen for a few weeks.  Rest the injured area. Avoid strenuous activity and any activities or movements that cause pain. This information is not intended to replace advice given to you by your health care provider. Make sure you discuss any questions you have with your health care provider. Document Revised: 08/01/2017 Document Reviewed: 08/01/2017 Elsevier Patient Education  2020 [employer]  

## 2020-03-12 ENCOUNTER — Telehealth: Payer: Self-pay

## 2020-03-12 NOTE — Telephone Encounter (Signed)
lvm for pt to view on mychart or return call to office

## 2020-03-12 NOTE — Telephone Encounter (Signed)
-----   Message from [name], [place] sent at 03/11/2020  5:25 PM EDT ----- No rib fracture and your pneumonia is improving

## 2020-04-12 ENCOUNTER — Ambulatory Visit (INDEPENDENT_AMBULATORY_CARE_PROVIDER_SITE_OTHER): Payer: No Typology Code available for payment source | Admitting: Nurse Practitioner

## 2020-04-12 ENCOUNTER — Other Ambulatory Visit: Payer: Self-pay

## 2020-04-12 ENCOUNTER — Encounter: Payer: Self-pay | Admitting: Nurse Practitioner

## 2020-04-12 VITALS — BP 118/72 | HR 77 | Temp 98.2°F | Ht 59.2 in | Wt 135.4 lb

## 2020-04-12 DIAGNOSIS — R1031 Right lower quadrant pain: Secondary | ICD-10-CM | POA: Diagnosis not present

## 2020-04-12 DIAGNOSIS — L659 Nonscarring hair loss, unspecified: Secondary | ICD-10-CM

## 2020-04-12 DIAGNOSIS — I10 Essential (primary) hypertension: Secondary | ICD-10-CM | POA: Diagnosis not present

## 2020-04-12 DIAGNOSIS — Z8616 Personal history of COVID-19: Secondary | ICD-10-CM | POA: Diagnosis not present

## 2020-04-12 LAB — POCT UA - MICROALBUMIN
Creatinine, POC: 10 mg/dL
Microalbumin Ur, POC: 10 mg/L

## 2020-04-12 NOTE — Progress Notes (Signed)
I,Yamilka Roman [employer] as a Education administrator for [employer], FNP.,have documented all relevant documentation on the behalf of [name], FNP,as directed by  [name], FNP while in the presence of [name], [place].  This visit occurred during the SARS-CoV-2 public health emergency.  Safety protocols were in place, including screening questions prior to the visit, additional usage of staff PPE, and extensive cleaning of exam room while observing appropriate contact time as indicated for disinfecting solutions.  Subjective:     Patient ID: Nichole Wilson , female    DOB: April 04, 1961 , 59 y.o.   MRN: [id number]   Chief Complaint  Patient presents with   right side pain    patient stated she has been having pain on her right side for the past week    HPI  Wt Readings from Last 3 Encounters: 04/12/20 : 135 lb 6.4 oz (61.4 kg) 03/10/20 : 136 lb (61.7 kg) 01/08/20 : 144 lb (65.3 kg)  She had covid in July 2021 - she was hospitalized as well.  She is back at work for 4 hours a day until the end of this month.  She feels like her memory has been challenged since having Covid.  She reports after having "fibroid" to her esophagus she was left with gastreparesis this was in 2017. She had a fall in August on the same side of her ribs she injured.    She is going to Dr. [name] at [facility]  Abdominal Pain This is a new problem. The current episode started in the past 7 days. The onset quality is sudden. The pain is located in the RLQ. The pain is at a severity of 8/10. The pain is severe. The quality of the pain is dull. The abdominal pain does not radiate. Pertinent negatives include no anorexia, belching, constipation, diarrhea, dysuria, fever, flatus, headaches, nausea or vomiting. The pain is aggravated by eating. Relieved by: fasting. She has tried nothing for the symptoms. There is no history of abdominal surgery.     Past Medical History:  Diagnosis Date   Abnormal LFTs 10/31/2018    ANA positive 08/06/2017   Anal fissure - posterior 11/26/2015   Anemia    Anterior scleritis of both eyes 05/12/2015   Followed by Dr Teola Bradley - Last OV 11/10, 11/18 - Update 11/18 - dx Acute toxic conjunctivitis > improved s/p prednisolone - Primary iridocyclitis (R-eye) - begin taper of prednisolone eye drops, much improved - Requested labs all resulted 11/16: ESR (21), ANA (neg), RPR (non-reactive, no FTA-Ab) >> faxed results back to Dr Sharen Counter    Blood transfusion without reported diagnosis    Breast cancer [facility]) 2017   Right Breast Cancer   Breast cancer of upper-outer quadrant of right female breast ([place]) 08/26/2015   Breast cyst, left 01-04-12   cyst no longer apparent   Cholelithiasis with choledocholithiasis    Chronic calculus cholecystitis 12/25/2011   Lap chole on 7/0/01    Complication of anesthesia 2002   PT STATES DURING HER C-SECTION SHE WAS AWARE OF INCISION AND PAIN -THEN WAS QUICKLY "PUT OUT"   Elevated alkaline phosphatase level 08/07/2018   Normal Alk Phos Isoenzyme   Essential hypertension 01/04/2016   Family history of adverse reaction to anesthesia    father became aggressive   Fatigue 07/22/2015   Gallstone pancreatitis    Gastric mass GIST 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    Health  care maintenance 03/24/2013   Hepatic steatosis    Hx of adenomatous polyp of colon 12/12/2018   Hypercholesterolemia with hypertriglyceridemia    Hypertension 01-04-12   med d/c-last taken 12-07-11  -PT [place] STOPPED HER B/P MEDICATION BECAUSE HER B/P TOO LOW AND PT STATES HER B/P'S HAVE BEEN OK SINCE.   Hypertriglyceridemia    Iron deficiency anemia 10/25/2018   Migraine without aura, without mention of intractable migraine without mention of status migrainosus    Overweight (BMI 25.0-29.9) 05/12/2015   Plantar fasciitis 09/05/2013   PONV (postoperative nausea and vomiting)     patient states that she did not get sick with mastectomy   Pre-diabetes    Sciatica 05/17/2012   Patient with left leg radicular lumbar pain.  No concerning symptoms aside from pain. Plan: Trial of steroid dosepak and tramadol as needed for pain Discussed warning signs or symptoms. Please see discharge instructions. Patient expresses understanding. F/u 2 weeks if not improved.     Type 2 diabetes mellitus with complication, without long-term current use of insulin ([place]) 05/12/2015     Family History  Problem Relation Age of Onset   Diabetes Mother    Diabetes Father    Diabetes Brother    Heart disease Paternal Uncle    Liver disease Brother    Colon cancer Neg Hx    Esophageal cancer Neg Hx    Rectal cancer Neg Hx    Stomach cancer Neg Hx      Current Outpatient Medications:    Ascorbic Acid (VITAMIN C) 1000 MG tablet, Take 1,000 mg by mouth daily., Disp: , Rfl:    Cholecalciferol (VITAMIN D3) 10000 units capsule, Take 10,000 Units by mouth daily., Disp: , Rfl:    fenofibrate (TRICOR) 145 MG tablet, Take 1 tablet (145 mg total) by mouth daily., Disp: 90 tablet, Rfl: 3   Icosapent Ethyl (VASCEPA) 1 g CAPS, Take 2 capsules (2 g total) by mouth 2 (two) times daily. Started 02/17/2019, Disp: 360 capsule, Rfl: 1   pantoprazole (PROTONIX) 40 MG tablet, TAKE 1 TABLET BY MOUTH ONCE A DAY BEFORE BREAKFAST, Disp: 90 tablet, Rfl: 3   pyridOXINE (VITAMIN B-6) 100 MG tablet, Take 100 mg by mouth daily., Disp: , Rfl:    RESTASIS 0.05 % ophthalmic emulsion, , Disp: , Rfl: 2   Thiamine HCl (VITAMIN B-1) 250 MG tablet, Take 250 mg by mouth daily., Disp: , Rfl:    vitamin B-12 (CYANOCOBALAMIN) 1000 MCG tablet, Take 1,000 mcg by mouth daily., Disp: , Rfl:    vitamin E 1000 UNIT capsule, Take 1,000 Units by mouth daily., Disp: , Rfl:    zinc gluconate 50 MG tablet, Take 50 mg by mouth daily., Disp: , Rfl:    atorvastatin (LIPITOR) 20 MG tablet, Take 1 tablet (20 mg total) by mouth  daily., Disp: 90 tablet, Rfl: 3   Allergies  Allergen Reactions   Lisinopril Swelling   Other     General anesthesia-severe vomiting     Review of Systems  Constitutional: Negative for fever.  Gastrointestinal: Positive for abdominal pain. Negative for anorexia, constipation, diarrhea, flatus, nausea and vomiting.  Genitourinary: Negative for dysuria.  Neurological: Negative for headaches.  Psychiatric/Behavioral: Negative.      Today's Vitals   04/12/20 0931  BP: 118/72  Pulse: 77  Temp: 98.2 F (36.8 C)  TempSrc: Oral  Weight: 135 lb 6.4 oz (61.4 kg)  Height: 4' 11.2" (1.504 m)  PainSc: 8    Body mass  index is 27.16 kg/m.   Objective:  Physical Exam Vitals reviewed.  Constitutional:      General: She is not in acute distress.    Appearance: Normal appearance.  Cardiovascular:     Rate and Rhythm: Normal rate and regular rhythm.     Pulses: Normal pulses.     Heart sounds: Normal heart sounds. No murmur heard.   Pulmonary:     Effort: Pulmonary effort is normal. No respiratory distress.     Breath sounds: Normal breath sounds.  Abdominal:     General: Abdomen is flat. Bowel sounds are normal. There is no distension.     Palpations: Abdomen is soft. There is no mass.     Tenderness: There is abdominal tenderness (right lower quadrant). There is no guarding or rebound.     Hernia: No hernia is present.  Neurological:     General: No focal deficit present.     Mental Status: She is alert and oriented to person, place, and time.     Cranial Nerves: No cranial nerve deficit.  Psychiatric:        Mood and Affect: Mood normal.        Behavior: Behavior normal.        Thought Content: Thought content normal.        Judgment: Judgment normal.         Assessment And Plan:     1. Right lower quadrant abdominal pain  Tenderness to right lower quadrant laterally  Will order CT abdomen to evaluate further  - CT Abdomen Pelvis Wo Contrast; Future -  CMP14+EGFR - CBC - Lipase - Amylase  2. Essential hypertension  Chronic, well controlled  Continue with current medications  Will check GFR for kidney functions - POCT UA - Microalbumin - CMP14+EGFR  3. Hair loss  Since having covid she is having hair loss when brushing or running fingers through her hair  Will check thyroid levels for further evaluation - TSH  4. History of COVID-19  She feels she can go back to work full hours  I will complete her form to return to work at full capacity without any restrictions     Patient was given opportunity to ask questions. Patient verbalized understanding of the plan and was able to repeat key elements of the plan. All questions were answered to their satisfaction.   Teola Bradley, FNP, have reviewed all documentation for this visit. The documentation on 04/13/20 for the exam, diagnosis, procedures, and orders are all accurate and complete.   THE PATIENT IS ENCOURAGED TO PRACTICE SOCIAL DISTANCING DUE TO THE COVID-19 PANDEMIC.

## 2020-04-13 LAB — CMP14+EGFR
ALT: 14 IU/L (ref 0–32)
AST: 16 IU/L (ref 0–40)
Albumin/Globulin Ratio: 1.7 (ref 1.2–2.2)
Albumin: 4.3 g/dL (ref 3.8–4.9)
Alkaline Phosphatase: 153 IU/L — ABNORMAL HIGH (ref 44–121)
BUN/Creatinine Ratio: 19 (ref 9–23)
BUN: 10 mg/dL (ref 6–24)
Bilirubin Total: 0.4 mg/dL (ref 0.0–1.2)
CO2: 23 mmol/L (ref 20–29)
Calcium: 9.4 mg/dL (ref 8.7–10.2)
Chloride: 104 mmol/L (ref 96–106)
Creatinine, Ser: 0.52 mg/dL — ABNORMAL LOW (ref 0.57–1.00)
GFR calc Af Amer: 123 mL/min/{1.73_m2} (ref >59)
GFR calc non Af Amer: 106 mL/min/{1.73_m2} (ref >59)
Globulin, Total: 2.5 g/dL (ref 1.5–4.5)
Glucose: 113 mg/dL — ABNORMAL HIGH (ref 65–99)
Potassium: 4.7 mmol/L (ref 3.5–5.2)
Sodium: 141 mmol/L (ref 134–144)
Total Protein: 6.8 g/dL (ref 6.0–8.5)

## 2020-04-13 LAB — CBC
Hematocrit: 38.6 % (ref 34.0–46.6)
Hemoglobin: 13 g/dL (ref 11.1–15.9)
MCH: 31.6 pg (ref 26.6–33.0)
MCHC: 33.7 g/dL (ref 31.5–35.7)
MCV: 94 fL (ref 79–97)
Platelets: 319 10*3/uL (ref 150–450)
RBC: 4.12 x10E6/uL (ref 3.77–5.28)
RDW: 13.6 % (ref 11.7–15.4)
WBC: 5.5 10*3/uL (ref 3.4–10.8)

## 2020-04-13 LAB — TSH: TSH: 1.26 u[IU]/mL (ref 0.450–4.500)

## 2020-04-13 LAB — AMYLASE: Amylase: 87 U/L (ref 31–110)

## 2020-04-13 LAB — LIPASE: Lipase: 33 U/L (ref 14–72)

## 2020-04-14 ENCOUNTER — Telehealth: Payer: Self-pay

## 2020-04-14 ENCOUNTER — Encounter: Payer: No Typology Code available for payment source | Admitting: Nurse Practitioner

## 2020-04-14 NOTE — Telephone Encounter (Signed)
I called patient and let her know that her forms have been completed and faxed. She stated she will pick up a copy when she comes for her next visit. YL,RMA

## 2020-04-23 ENCOUNTER — Ambulatory Visit
Admission: RE | Admit: 2020-04-23 | Discharge: 2020-04-23 | Disposition: A | Discharge status: Home / Self Care | Payer: No Typology Code available for payment source | Source: Ambulatory Visit | Attending: Nurse Practitioner | Admitting: Nurse Practitioner

## 2020-04-23 DIAGNOSIS — R1031 Right lower quadrant pain: Secondary | ICD-10-CM

## 2020-04-26 ENCOUNTER — Other Ambulatory Visit: Payer: Self-pay

## 2020-04-26 ENCOUNTER — Ambulatory Visit (INDEPENDENT_AMBULATORY_CARE_PROVIDER_SITE_OTHER): Payer: No Typology Code available for payment source | Admitting: Nurse Practitioner

## 2020-04-26 ENCOUNTER — Encounter: Payer: Self-pay | Admitting: Nurse Practitioner

## 2020-04-26 VITALS — BP 118/76 | HR 92 | Temp 98.3°F | Ht 59.2 in | Wt 137.0 lb

## 2020-04-26 DIAGNOSIS — E781 Pure hyperglyceridemia: Secondary | ICD-10-CM

## 2020-04-26 DIAGNOSIS — Z0000 Encounter for general adult medical examination without abnormal findings: Secondary | ICD-10-CM | POA: Diagnosis not present

## 2020-04-26 DIAGNOSIS — L659 Nonscarring hair loss, unspecified: Secondary | ICD-10-CM | POA: Diagnosis not present

## 2020-04-26 DIAGNOSIS — I10 Essential (primary) hypertension: Secondary | ICD-10-CM | POA: Diagnosis not present

## 2020-04-26 DIAGNOSIS — R7303 Prediabetes: Secondary | ICD-10-CM | POA: Diagnosis not present

## 2020-04-26 LAB — POCT URINALYSIS DIPSTICK
Bilirubin, UA: NEGATIVE
Blood, UA: NEGATIVE
Glucose, UA: NEGATIVE
Ketones, UA: NEGATIVE
Nitrite, UA: NEGATIVE
Protein, UA: NEGATIVE
Spec Grav, UA: 1.025 (ref 1.010–1.025)
Urobilinogen, UA: 0.2 E.U./dL
pH, UA: 7.5 (ref 5.0–8.0)

## 2020-04-26 LAB — POCT UA - MICROALBUMIN
Albumin/Creatinine Ratio, Urine, POC: 30
Creatinine, POC: 100 mg/dL
Microalbumin Ur, POC: 10 mg/L

## 2020-04-26 NOTE — Patient Instructions (Signed)
Hypertension, Adult Hypertension is another name for high blood pressure. High blood pressure forces your heart to work harder to pump blood. This can cause problems over time. There are two numbers in a blood pressure reading. There is a top number (systolic) over a bottom number (diastolic). It is best to have a blood pressure that is below 120/80. Healthy choices can help lower your blood pressure, or you may need medicine to help lower it. What are the causes? The cause of this condition is not known. Some conditions may be related to high blood pressure. What increases the risk?  Smoking.  Having type 2 diabetes mellitus, high cholesterol, or both.  Not getting enough exercise or physical activity.  Being overweight.  Having too much fat, sugar, calories, or salt (sodium) in your diet.  Drinking too much alcohol.  Having long-term (chronic) kidney disease.  Having a family history of high blood pressure.  Age. Risk increases with age.  Race. You may be at higher risk if you are African American.  Gender. Men are at higher risk than women before age 85. After age 20, women are at higher risk than men.  Having obstructive sleep apnea.  Stress. What are the signs or symptoms?  High blood pressure may not cause symptoms. Very high blood pressure (hypertensive crisis) may cause: ? Headache. ? Feelings of worry or nervousness (anxiety). ? Shortness of breath. ? Nosebleed. ? A feeling of being sick to your stomach (nausea). ? Throwing up (vomiting). ? Changes in how you see. ? Very bad chest pain. ? Seizures. How is this treated?  This condition is treated by making healthy lifestyle changes, such as: ? Eating healthy foods. ? Exercising more. ? Drinking less alcohol.  Your health care provider may prescribe medicine if lifestyle changes are not enough to get your blood pressure under control, and if: ? Your top number is above 130. ? Your bottom number is above  80.  Your personal target blood pressure may vary. Follow these instructions at home: Eating and drinking   If told, follow the DASH eating plan. To follow this plan: ? Fill one half of your plate at each meal with fruits and vegetables. ? Fill one fourth of your plate at each meal with whole grains. Whole grains include whole-wheat pasta, brown rice, and whole-grain bread. ? Eat or drink low-fat dairy products, such as skim milk or low-fat yogurt. ? Fill one fourth of your plate at each meal with low-fat (lean) proteins. Low-fat proteins include fish, chicken without skin, eggs, beans, and tofu. ? Avoid fatty meat, cured and processed meat, or chicken with skin. ? Avoid pre-made or processed food.  Eat less than 1,500 mg of salt each day.  Do not drink alcohol if: ? Your doctor tells you not to drink. ? You are pregnant, may be pregnant, or are planning to become pregnant.  If you drink alcohol: ? Limit how much you use to:  0-1 drink a day for women.  0-2 drinks a day for men. ? Be aware of how much alcohol is in your drink. In the [place] one drink equals one 12 oz bottle of beer (355 mL), one 5 oz glass of wine (148 mL), or one 1 oz glass of hard liquor (44 mL). Lifestyle   Work with your doctor to stay at a healthy weight or to lose weight. Ask your doctor what the best weight is for you.  Get at least 30 minutes of exercise most  days of the week. This may include walking, swimming, or biking.  Get at least 30 minutes of exercise that strengthens your muscles (resistance exercise) at least 3 days a week. This may include lifting weights or doing Pilates.  Do not use any products that contain nicotine or tobacco, such as cigarettes, e-cigarettes, and chewing tobacco. If you need help quitting, ask your doctor.  Check your blood pressure at home as told by your doctor.  Keep all follow-up visits as told by your doctor. This is important. Medicines  Take over-the-counter  and prescription medicines only as told by your doctor. Follow directions carefully.  Do not skip doses of blood pressure medicine. The medicine does not work as well if you skip doses. Skipping doses also puts you at risk for problems.  Ask your doctor about side effects or reactions to medicines that you should watch for. Contact a doctor if you:  Think you are having a reaction to the medicine you are taking.  Have headaches that keep coming back (recurring).  Feel dizzy.  Have swelling in your ankles.  Have trouble with your vision. Get help right away if you:  Get a very bad headache.  Start to feel mixed up (confused).  Feel weak or numb.  Feel faint.  Have very bad pain in your: ? Chest. ? Belly (abdomen).  Throw up more than once.  Have trouble breathing. Summary  Hypertension is another name for high blood pressure.  High blood pressure forces your heart to work harder to pump blood.  For most people, a normal blood pressure is less than 120/80.  Making healthy choices can help lower blood pressure. If your blood pressure does not get lower with healthy choices, you may need to take medicine. This information is not intended to replace advice given to you by your health care provider. Make sure you discuss any questions you have with your health care provider. Document Revised: 03/13/2018 Document Reviewed: 03/13/2018 Elsevier Patient Education  2020 Else[employer] Inc. Health Maintenance, Female Adopting a healthy lifestyle and getting preventive care are important in promoting health and wellness. Ask your health care provider about:  The right schedule for you to have regular tests and exams.  Things you can do on your own to prevent diseases and keep yourself healthy. What should I know about diet, weight, and exercise? Eat a healthy diet   Eat a diet that includes plenty of vegetables, fruits, low-fat dairy products, and lean protein.  Do not eat a  lot of foods that are high in solid fats, added sugars, or sodium. Maintain a healthy weight Body mass index (BMI) is used to identify weight problems. It estimates body fat based on height and weight. Your health care provider can help determine your BMI and help you achieve or maintain a healthy weight. Get regular exercise Get regular exercise. This is one of the most important things you can do for your health. Most adults should:  Exercise for at least 150 minutes each week. The exercise should increase your heart rate and make you sweat (moderate-intensity exercise).  Do strengthening exercises at least twice a week. This is in addition to the moderate-intensity exercise.  Spend less time sitting. Even light physical activity can be beneficial. Watch cholesterol and blood lipids Have your blood tested for lipids and cholesterol at 57 years of age, then have this test every 5 years. Have your cholesterol levels checked more often if:  Your lipid or cholesterol levels are   high.  You are older than 57 years of age.  You are at high risk for heart disease. What should I know about cancer screening? Depending on your health history and family history, you may need to have cancer screening at various ages. This may include screening for:  Breast cancer.  Cervical cancer.  Colorectal cancer.  Skin cancer.  Lung cancer. What should I know about heart disease, diabetes, and high blood pressure? Blood pressure and heart disease  High blood pressure causes heart disease and increases the risk of stroke. This is more likely to develop in people who have high blood pressure readings, are of African descent, or are overweight.  Have your blood pressure checked: ? Every 3-5 years if you are 18-39 years of age. ? Every year if you are 40 years old or older. Diabetes Have regular diabetes screenings. This checks your fasting blood sugar level. Have the screening done:  Once every  three years after age 40 if you are at a normal weight and have a low risk for diabetes.  More often and at a younger age if you are overweight or have a high risk for diabetes. What should I know about preventing infection? Hepatitis B If you have a higher risk for hepatitis B, you should be screened for this virus. Talk with your health care provider to find out if you are at risk for hepatitis B infection. Hepatitis C Testing is recommended for:  Everyone born from 1945 through 1965.  Anyone with known risk factors for hepatitis C. Sexually transmitted infections (STIs)  Get screened for STIs, including gonorrhea and chlamydia, if: ? You are sexually active and are younger than 57 years of age. ? You are older than 57 years of age and your health care provider tells you that you are at risk for this type of infection. ? Your sexual activity has changed since you were last screened, and you are at increased risk for chlamydia or gonorrhea. Ask your health care provider if you are at risk.  Ask your health care provider about whether you are at high risk for HIV. Your health care provider may recommend a prescription medicine to help prevent HIV infection. If you choose to take medicine to prevent HIV, you should first get tested for HIV. You should then be tested every 3 months for as long as you are taking the medicine. Pregnancy  If you are about to stop having your period (premenopausal) and you may become pregnant, seek counseling before you get pregnant.  Take 400 to 800 micrograms (mcg) of folic acid every day if you become pregnant.  Ask for birth control (contraception) if you want to prevent pregnancy. Osteoporosis and menopause Osteoporosis is a disease in which the bones lose minerals and strength with aging. This can result in bone fractures. If you are 65 years old or older, or if you are at risk for osteoporosis and fractures, ask your health care provider if you  should:  Be screened for bone loss.  Take a calcium or vitamin D supplement to lower your risk of fractures.  Be given hormone replacement therapy (HRT) to treat symptoms of menopause. Follow these instructions at home: Lifestyle  Do not use any products that contain nicotine or tobacco, such as cigarettes, e-cigarettes, and chewing tobacco. If you need help quitting, ask your health care provider.  Do not use street drugs.  Do not share needles.  Ask your health care provider for help if you   need support or information about quitting drugs. Alcohol use  Do not drink alcohol if: ? Your health care provider tells you not to drink. ? You are pregnant, may be pregnant, or are planning to become pregnant.  If you drink alcohol: ? Limit how much you use to 0-1 drink a day. ? Limit intake if you are breastfeeding.  Be aware of how much alcohol is in your drink. In the [place] one drink equals one 12 oz bottle of beer (355 mL), one 5 oz glass of wine (148 mL), or one 1 oz glass of hard liquor (44 mL). General instructions  Schedule regular health, dental, and eye exams.  Stay current with your vaccines.  Tell your health care provider if: ? You often feel depressed. ? You have ever been abused or do not feel safe at home. Summary  Adopting a healthy lifestyle and getting preventive care are important in promoting health and wellness.  Follow your health care provider's instructions about healthy diet, exercising, and getting tested or screened for diseases.  Follow your health care provider's instructions on monitoring your cholesterol and blood pressure. This information is not intended to replace advice given to you by your health care provider. Make sure you discuss any questions you have with your health care provider. Document Revised: 06/26/2018 Document Reviewed: 06/26/2018 Elsevier Patient Education  2020 [employer] Inc.  

## 2020-04-26 NOTE — Progress Notes (Signed)
Rutherford Nail as a scribe for [name], FNP.,have documented all relevant documentation on the behalf of [name], FNP,as directed by  [name], FNP while in the presence of [name], [place]. This visit occurred during the SARS-CoV-2 public health emergency.  Safety protocols were in place, including screening questions prior to the visit, additional usage of staff PPE, and extensive cleaning of exam room while observing appropriate contact time as indicated for disinfecting solutions.  Subjective:     Patient ID: Nichole Wilson , female    DOB: 12-26-1960 , 59 y.o.   MRN: [id number]   Chief Complaint  Patient presents with  . Annual Exam    HPI  Pt here today for Health Maintenance exam     Past Medical History:  Diagnosis Date  . Abnormal LFTs 10/31/2018  . ANA positive 08/06/2017  . Anal fissure - posterior 11/26/2015  . Anemia   . Anterior scleritis of both eyes 05/12/2015   Followed by Dr Teola Bradley - Last OV 11/10, 11/18 - Update 11/18 - dx Acute toxic conjunctivitis > improved s/p prednisolone - Primary iridocyclitis (R-eye) - begin taper of prednisolone eye drops, much improved - Requested labs all resulted 11/16: ESR (21), ANA (neg), RPR (non-reactive, no FTA-Ab) >> faxed results back to Dr Sharen Counter   . Blood transfusion without reported diagnosis   . Breast cancer ([place]) 2017   Right Breast Cancer  . Breast cancer of upper-outer quadrant of right female breast ([place]) 08/26/2015  . Breast cyst, left 01-04-12   cyst no longer apparent  . Cholelithiasis with choledocholithiasis   . Chronic calculus cholecystitis 12/25/2011   Lap chole on 03/25/12   . Complication of anesthesia 2002   PT STATES DURING HER C-SECTION SHE WAS AWARE OF INCISION AND PAIN -THEN WAS QUICKLY "PUT OUT"  . Elevated alkaline phosphatase level 08/07/2018   Normal Alk Phos Isoenzyme  . Essential hypertension 01/04/2016  . Family history of adverse reaction to anesthesia    father became  aggressive  . Fatigue 07/22/2015  . Gallstone pancreatitis   . Gastric mass GIST vs leiomyoma 12/01/2015  . Glucose intolerance (impaired glucose tolerance)    since at least 2011.   [name] Health care maintenance 03/24/2013  . Hepatic steatosis   . Hx of adenomatous polyp of colon 12/12/2018  . Hypercholesterolemia with hypertriglyceridemia   . Hypertension 01-04-12   med d/c-last taken 12-07-11  -PT Rossiter STOPPED HER B/P MEDICATION BECAUSE HER B/P TOO LOW AND PT STATES HER B/P'S HAVE BEEN OK SINCE.  [name] Hypertriglyceridemia   . Iron deficiency anemia 10/25/2018  . Migraine without aura, without mention of intractable migraine without mention of status migrainosus   . Overweight (BMI 25.0-29.9) 05/12/2015  . Plantar fasciitis 09/05/2013  . PONV (postoperative nausea and vomiting)    patient states that she did not get sick with mastectomy  . Pre-diabetes   . Sciatica 05/17/2012   Patient with left leg radicular lumbar pain.  No concerning symptoms aside from pain. Plan: Trial of steroid dosepak and tramadol as needed for pain Discussed warning signs or symptoms. Please see discharge instructions. Patient expresses understanding. F/u 2 weeks if not improved.    . Type 2 diabetes mellitus with complication, without long-term current use of insulin ([place]) 05/12/2015     Family History  Problem Relation Age of Onset  . Diabetes Mother   . Diabetes Father   . Diabetes Brother   . Heart disease Paternal Uncle   .  Liver disease Brother   . Colon cancer Neg Hx   . Esophageal cancer Neg Hx   . Rectal cancer Neg Hx   . Stomach cancer Neg Hx      Current Outpatient Medications:  .  Ascorbic Acid (VITAMIN C) 1000 MG tablet, Take 1,000 mg by mouth daily., Disp: , Rfl:  .  atorvastatin (LIPITOR) 20 MG tablet, Take 1 tablet (20 mg total) by mouth daily., Disp: 90 tablet, Rfl: 3 .  Cholecalciferol (VITAMIN D3) 10000 units capsule, Take 10,000 Units by mouth daily., Disp: , Rfl:  .   fenofibrate (TRICOR) 145 MG tablet, Take 1 tablet (145 mg total) by mouth daily., Disp: 90 tablet, Rfl: 3 .  Icosapent Ethyl (VASCEPA) 1 g CAPS, Take 2 capsules (2 g total) by mouth 2 (two) times daily. Started 02/17/2019, Disp: 360 capsule, Rfl: 1 .  pantoprazole (PROTONIX) 40 MG tablet, TAKE 1 TABLET BY MOUTH ONCE A DAY BEFORE BREAKFAST, Disp: 90 tablet, Rfl: 3 .  pyridOXINE (VITAMIN B-6) 100 MG tablet, Take 100 mg by mouth daily., Disp: , Rfl:  .  RESTASIS 0.05 % ophthalmic emulsion, , Disp: , Rfl: 2 .  Thiamine HCl (VITAMIN B-1) 250 MG tablet, Take 250 mg by mouth daily., Disp: , Rfl:  .  vitamin B-12 (CYANOCOBALAMIN) 1000 MCG tablet, Take 1,000 mcg by mouth daily., Disp: , Rfl:  .  vitamin E 1000 UNIT capsule, Take 1,000 Units by mouth daily., Disp: , Rfl:  .  zinc gluconate 50 MG tablet, Take 50 mg by mouth daily., Disp: , Rfl:    Allergies  Allergen Reactions  . Lisinopril Swelling  . Other     General anesthesia-severe vomiting      The patient states she uses post menopausal status for birth control.Patient's last menstrual period was 11/29/2011.. Negative for: breast discharge, breast lump(s), breast pain and breast self exam. Associated symptoms include abnormal vaginal bleeding. Pertinent negatives include abnormal bleeding (hematology), anxiety, decreased libido, depression, difficulty falling sleep, dyspareunia, history of infertility, nocturia, sexual dysfunction, sleep disturbances, urinary incontinence, urinary urgency, vaginal discharge and vaginal itching. Diet regular; limits her sugar and carbohydrate intake. The patient states her exercise level is moderate - 30 - 40 minutes a day every other day.    The patient's tobacco use is:  Social History   Tobacco Use  Smoking Status Never Smoker  Smokeless Tobacco Never Used   She has been exposed to passive smoke. The patient's alcohol use is:  Social History   Substance and Sexual Activity  Alcohol Use No   Additional  information: Last pap 02/27/2019, next one scheduled for 03/15/2022    Review of Systems  Constitutional: Negative.  Negative for fatigue.  HENT: Negative.   Respiratory: Negative.   Cardiovascular: Negative.   Gastrointestinal: Negative.   Endocrine: Negative for polydipsia, polyphagia and polyuria.  Musculoskeletal: Negative.   Skin: Negative.   Neurological: Negative.  Negative for dizziness and headaches.  Psychiatric/Behavioral: Negative.      Today's Vitals   04/26/20 0917  BP: 118/76  Pulse: 92  Temp: 98.3 F (36.8 C)  TempSrc: Oral  Weight: 137 lb (62.1 kg)  Height: 4' 11.2" (1.504 m)  PainSc: 0-No pain   Body mass index is 27.48 kg/m.   Objective:  Physical Exam Vitals reviewed.  Constitutional:      General: She is not in acute distress.    Appearance: Normal appearance. She is well-developed. She is obese.  HENT:     Head: Normocephalic  and atraumatic.     Right Ear: Hearing, tympanic membrane, ear canal and external ear normal. There is no impacted cerumen.     Left Ear: Hearing, tympanic membrane, ear canal and external ear normal. There is no impacted cerumen.     Nose:     Comments: Deferred - masked    Mouth/Throat:     Comments: Deferred - masked Eyes:     General: Lids are normal.     Extraocular Movements: Extraocular movements intact.     Conjunctiva/sclera: Conjunctivae normal.     Pupils: Pupils are equal, round, and reactive to light.     Funduscopic exam:    Right eye: No papilledema.        Left eye: No papilledema.  Neck:     Thyroid: No thyroid mass.     Vascular: No carotid bruit.  Cardiovascular:     Rate and Rhythm: Normal rate and regular rhythm.     Pulses: Normal pulses.     Heart sounds: Normal heart sounds. No murmur heard.   Pulmonary:     Effort: Pulmonary effort is normal. No respiratory distress.     Breath sounds: Normal breath sounds. No wheezing.  Abdominal:     General: Abdomen is flat. Bowel sounds are normal.  There is no distension.     Palpations: Abdomen is soft.     Tenderness: There is no abdominal tenderness.  Genitourinary:    Rectum: Guaiac result negative.  Musculoskeletal:        General: No swelling. Normal range of motion.     Cervical back: Full passive range of motion without pain, normal range of motion and neck supple.     Right lower leg: No edema.     Left lower leg: No edema.  Skin:    General: Skin is warm and dry.     Capillary Refill: Capillary refill takes less than 2 seconds.  Neurological:     General: No focal deficit present.     Mental Status: She is alert and oriented to person, place, and time.     Cranial Nerves: No cranial nerve deficit.     Sensory: No sensory deficit.  Psychiatric:        Mood and Affect: Mood normal.        Behavior: Behavior normal.        Thought Content: Thought content normal.        Judgment: Judgment normal.         Assessment And Plan:     1. Health care maintenance . Behavior modifications discussed and diet history reviewed.   . Pt will continue to exercise regularly and modify diet with low GI, plant based foods and decrease intake of processed foods.  . Recommend intake of daily multivitamin, Vitamin D, and calcium.  . Recommend mammogram and colonoscopy are both up to date for preventive screenings, as well as recommend immunizations that include influenza, TDAP   2. Essential hypertension  Chronic, blood pressure is well controlled  Continue with current medications - POCT Urinalysis Dipstick (81002) - POCT UA - Microalbumin - EKG 12-Lead  3. Hypertriglyceridemia  Chronic, stable  Continue with current medications and avoiding increased processed foods - Lipid panel  4. Hair loss  She is concerned about thinning hair   Will check thyroid studies at the request of the patient - TSH - Thyroid antibodies - Thyroid Peroxidase Antibody  5. Prediabetes  Chronic, good control  Continue with healthy  diet  low in sugar and starches - Hemoglobin A1c   Patient was given opportunity to ask questions. Patient verbalized understanding of the plan and was able to repeat key elements of the plan. All questions were answered to their satisfaction.    Teola Bradley, FNP, have reviewed all documentation for this visit. The documentation on 04/26/20 for the exam, diagnosis, procedures, and orders are all accurate and complete.  THE PATIENT IS ENCOURAGED TO PRACTICE SOCIAL DISTANCING DUE TO THE COVID-19 PANDEMIC.

## 2020-04-27 LAB — HEMOGLOBIN A1C
Est. average glucose Bld gHb Est-mCnc: 134 mg/dL
Hgb A1c MFr Bld: 6.3 % — ABNORMAL HIGH (ref 4.8–5.6)

## 2020-04-27 LAB — TSH: TSH: 1.16 u[IU]/mL (ref 0.450–4.500)

## 2020-04-27 LAB — LIPID PANEL
Chol/HDL Ratio: 5.4 ratio — ABNORMAL HIGH (ref 0.0–4.4)
Cholesterol, Total: 280 mg/dL — ABNORMAL HIGH (ref 100–199)
HDL: 52 mg/dL (ref >39)
LDL Chol Calc (NIH): 149 mg/dL — ABNORMAL HIGH (ref 0–99)
Triglycerides: 421 mg/dL — ABNORMAL HIGH (ref 0–149)
VLDL Cholesterol Cal: 79 mg/dL — ABNORMAL HIGH (ref 5–40)

## 2020-04-27 LAB — THYROID ANTIBODIES
Thyroglobulin Antibody: <1 IU/mL (ref 0.0–0.9)
Thyroperoxidase Ab SerPl-aCnc: <8 IU/mL (ref 0–34)

## 2020-04-29 NOTE — Progress Notes (Signed)
I will add a vitamin d level, pending those results if normal I will refer her to a dermatologist

## 2020-05-03 LAB — VITAMIN D 25 HYDROXY (VIT D DEFICIENCY, FRACTURES): Vit D, 25-Hydroxy: 44.3 ng/mL (ref 30.0–100.0)

## 2020-05-03 LAB — SPECIMEN STATUS REPORT

## 2020-05-07 ENCOUNTER — Other Ambulatory Visit: Payer: Self-pay | Admitting: Nurse Practitioner

## 2020-05-07 DIAGNOSIS — L659 Nonscarring hair loss, unspecified: Secondary | ICD-10-CM

## 2020-05-20 MED FILL — FENOFIBRATE 145 MG TABS: 145 | 90 days supply | Qty: 90 | Fill #1

## 2020-05-24 NOTE — Progress Notes (Signed)
Patient Care Team: [name], FNP as PCP - General (General Practice)  DIAGNOSIS:    ICD-10-CM   1. Malignant neoplasm of upper-outer quadrant of right breast in female, estrogen receptor positive ([place])  C50.411    Z17.0     SUMMARY OF ONCOLOGIC HISTORY: Oncology History  Breast cancer of upper-outer quadrant of right female breast ([place])  11/15/2012 Imaging   DEXA scan: Normal. T-score -0.7   08/10/2015 Breast MRI   Enhancing mass in right breast upper outer quadrant 8 x 5 x 7 mm, no retroareolar right breast abnormality, no abnormality of the site of nipple retraction   08/18/2015 Initial Biopsy   Right breast biopsy: Invasive ductal carcinoma with DCIS, grade 2 with focal necrosis,ER 100%, PR 0%, Ki-67 5%,HER-2 positive ratio 4.52   08/19/2015 Mammogram   Right breast calcifications now seem to span 4 x 2 x 1.5 cm   09/15/2015 Surgery   Rt mastectomy [name]): IDC Grade 2, 0.18 cm, with DCIS, 0/1 LN, ER 100%, PR 0%, HER2 Positive 4.52, T1aN0M0 (Stage 1A)   10/12/2015 - 11/23/2015 Chemotherapy   Herceptin every 3 weeks 3 cycles (based on [facility] trial: Short herceptin protocol)   11/2015 - 09/02/2018 Anti-estrogen oral therapy   Anastrazole 1 mg daily stopped 09/04/2016 due to severe fatigue resumed May 2018     CHIEF COMPLIANT: Follow-up of left breast cancer  INTERVAL HISTORY: Nichole Wilson is a 59 y.o. with above-mentioned history of right breast cancer who is currently on surveillance. Mammogram on 01/22/20 showed left breast cysts and no evidence of malignancy. She presents to the clinic today for follow-up.  She was at [facility] with Covid pneumonia 02/09/2020-02/16/2020. She had profound loss of weight energy levels and she finally recovered to be able to come back to work starting late September. She continues to have some memory lapses but she thinks it is getting better. She denies any pain lumps or nodules in the breast. She had profound hair loss as result  of Covid pneumonia and infection.  ALLERGIES:  is allergic to lisinopril and other.  MEDICATIONS:  Current Outpatient Medications  Medication Sig Dispense Refill  . Ascorbic Acid (VITAMIN C) 1000 MG tablet Take 1,000 mg by mouth daily.    [name] atorvastatin (LIPITOR) 20 MG tablet Take 1 tablet (20 mg total) by mouth daily. 90 tablet 3  . Cholecalciferol (VITAMIN D3) 10000 units capsule Take 10,000 Units by mouth daily.    . fenofibrate (TRICOR) 145 MG tablet Take 1 tablet (145 mg total) by mouth daily. 90 tablet 3  . Icosapent Ethyl (VASCEPA) 1 g CAPS Take 2 capsules (2 g total) by mouth 2 (two) times daily. Started 02/17/2019 360 capsule 1  . pantoprazole (PROTONIX) 40 MG tablet TAKE 1 TABLET BY MOUTH ONCE A DAY BEFORE BREAKFAST 90 tablet 3  . pyridOXINE (VITAMIN B-6) 100 MG tablet Take 100 mg by mouth daily.    . RESTASIS 0.05 % ophthalmic emulsion   2  . Thiamine HCl (VITAMIN B-1) 250 MG tablet Take 250 mg by mouth daily.    . vitamin B-12 (CYANOCOBALAMIN) 1000 MCG tablet Take 1,000 mcg by mouth daily.    . vitamin E 1000 UNIT capsule Take 1,000 Units by mouth daily.    [name] zinc gluconate 50 MG tablet Take 50 mg by mouth daily.     No current facility-administered medications for this visit.    PHYSICAL EXAMINATION: ECOG PERFORMANCE STATUS: 1 - Symptomatic but completely ambulatory  Vitals:  05/25/20 0917  BP: 124/89  Pulse: 95  Resp: 18  Temp: 97.8 F (36.6 C)  SpO2: 95%   Filed Weights   05/25/20 0917  Weight: 141 lb (64 kg)    BREAST: Mastectomy on the right no palpable lumps or nodules. Left breast no lumps or nodules. No lymphadenopathy. (exam performed in the presence of a chaperone)  LABORATORY DATA:  I have reviewed the data as listed CMP Latest Ref Rng & Units 04/12/2020 12/04/2019 09/10/2019  Glucose 65 - 99 mg/dL 113(H) 113(H) 127(H)  BUN 6 - 24 mg/dL 10 15 11   Creatinine 0.57 - 1.00 mg/dL 0.52(L) 0.64 0.57  Sodium 134 - 144 mmol/L 141 140 141  Potassium 3.5 - 5.2  mmol/L 4.7 4.2 4.4  Chloride 96 - 106 mmol/L 104 103 101  CO2 20 - 29 mmol/L 23 23 24   Calcium 8.7 - 10.2 mg/dL 9.4 9.5 9.4  Total Protein 6.0 - 8.5 g/dL 6.8 7.1 7.1  Total Bilirubin 0.0 - 1.2 mg/dL 0.4 0.4 0.4  Alkaline Phos 44 - 121 IU/L 153(H) 140(H) 183(H)  AST 0 - 40 IU/L 16 16 16   ALT 0 - 32 IU/L 14 17 18     Lab Results  Component Value Date   WBC 5.5 04/12/2020   HGB 13.0 04/12/2020   HCT 38.6 04/12/2020   MCV 94 04/12/2020   PLT 319 04/12/2020   NEUTROABS 2.5 12/12/2018    ASSESSMENT & PLAN:  Breast cancer of upper-outer quadrant of right female breast ([place]) Rt mastectomy 09/15/15: IDC Grade 2, 0.18 cm, with DCIS, 0/1 LN, ER 100%, PR 0%, HER2 Positive 4.52, T1aN0M0 (Stage 1A) Final tumor size: 0.35 cm on biopsy +0.18 cm on final lumpectomy equals 0.53 cm  Treatment plan: 1. shorter duration of anti-HER-2 therapy based upon [facility] trial where they used only 9 weeks of Herceptin treatment. Herceptin q 3 weeks 3 cycles.  2. Adjuvant antiestrogen therapy with anastrozole 1 mg daily started 11/15/2015, stopped 09/04/2016 due to severe fatigueResumed March 2018 stopped February 2020  Breast cancer surveillance: 1.Mammograms  02/22/2018: No suspicious masses breast density category C  2.Breast MRI08/31/2018: No evidence of any cancer 3.CT abdomen and pelvis 04/23/2020: No evidence of metastatic disease , fatty liver  Upper back pain: CT scan April 2020: No evidence of metastatic disease. Small lung nodules: Will need follow-up. Covid pneumonia: July 2021: Admitted at [facility]. She has made very good recovery from that.  Iron deficiency anemia: IV iron given April 2020  Return to clinic in 1 year for follow-up    No orders of the defined types were placed in this encounter.  The patient has a good understanding of the overall plan. she agrees with it. she will call with any problems that may develop before the next visit here.  Total time  spent: 20 mins including face to face time and time spent for planning, charting and coordination of care  Nicholas Lose, MD 05/25/2020  I, Cloyde Reams Dorshimer, am acting as scribe for Dr. Nicholas Lose.  I have reviewed the above documentation for accuracy and completeness, and I agree with the above.

## 2020-05-25 ENCOUNTER — Inpatient Hospital Stay
Payer: No Typology Code available for payment source | Attending: Hematology and Oncology | Admitting: Hematology and Oncology

## 2020-05-25 ENCOUNTER — Other Ambulatory Visit: Payer: Self-pay

## 2020-05-25 DIAGNOSIS — N6002 Solitary cyst of left breast: Secondary | ICD-10-CM | POA: Insufficient documentation

## 2020-05-25 DIAGNOSIS — Z170 Estrogen receptor positive status [ER+]: Secondary | ICD-10-CM | POA: Diagnosis not present

## 2020-05-25 DIAGNOSIS — Z8616 Personal history of COVID-19: Secondary | ICD-10-CM | POA: Insufficient documentation

## 2020-05-25 DIAGNOSIS — R911 Solitary pulmonary nodule: Secondary | ICD-10-CM | POA: Diagnosis not present

## 2020-05-25 DIAGNOSIS — C50411 Malignant neoplasm of upper-outer quadrant of right female breast: Secondary | ICD-10-CM | POA: Diagnosis present

## 2020-05-25 NOTE — Assessment & Plan Note (Signed)
Rt mastectomy 09/15/15: IDC Grade 2, 0.18 cm, with DCIS, 0/1 LN, ER 100%, PR 0%, HER2 Positive 4.52, T1aN0M0 (Stage 1A) Final tumor size: 0.35 cm on biopsy +0.18 cm on final lumpectomy equals 0.53 cm  Treatment plan: 1. shorter duration of anti-HER-2 therapy based upon [facility] trial where they used only 9 weeks of Herceptin treatment. Herceptin q 3 weeks 3 cycles.  2. Adjuvant antiestrogen therapy with anastrozole 1 mg daily started 11/15/2015, stopped 09/04/2016 due to severe fatigueResumed March 2018 stopped February 2020  Breast cancer surveillance: 1.Mammograms  02/22/2018: No suspicious masses breast density category C  2.Breast MRI08/31/2018: No evidence of any cancer 3.CT abdomen and pelvis 04/23/2020: No evidence of metastatic disease , fatty liver  Upper back pain: CT scan April 2020: No evidence of metastatic disease. Small lung nodules: Will need follow-up.  Iron deficiency anemia: IV iron given April 2020  Return to clinic in 1 year for follow-up

## 2020-06-01 ENCOUNTER — Encounter: Payer: Self-pay | Admitting: Nurse Practitioner

## 2020-06-01 ENCOUNTER — Telehealth (INDEPENDENT_AMBULATORY_CARE_PROVIDER_SITE_OTHER): Payer: No Typology Code available for payment source | Admitting: Nurse Practitioner

## 2020-06-01 DIAGNOSIS — Z8616 Personal history of COVID-19: Secondary | ICD-10-CM | POA: Diagnosis not present

## 2020-06-01 DIAGNOSIS — U099 Post covid-19 condition, unspecified: Secondary | ICD-10-CM | POA: Diagnosis not present

## 2020-06-01 DIAGNOSIS — L650 Telogen effluvium: Secondary | ICD-10-CM

## 2020-06-01 DIAGNOSIS — R41840 Attention and concentration deficit: Secondary | ICD-10-CM | POA: Diagnosis not present

## 2020-06-01 HISTORY — DX: Personal history of COVID-19: Z86.16

## 2020-06-01 HISTORY — DX: Telogen effluvium: L65.0

## 2020-06-01 HISTORY — DX: Attention and concentration deficit: R41.840

## 2020-06-01 LAB — HM DIABETES EYE EXAM

## 2020-06-01 NOTE — Progress Notes (Addendum)
Virtual Visit via Telephone Note  I connected with Nichole Wilson on 06/01/20 at  8:00 AM EST by telephone and verified that I am speaking with the correct person using two identifiers.  Location: Patient: home Provider: office   I discussed the limitations, risks, security and privacy concerns of performing an evaluation and management service by telephone and the availability of in person appointments. I also discussed with the patient that there may be a patient responsible charge related to this service. The patient expressed understanding and agreed to proceed.   History of Present Illness:  59 year old female with history of hypertension type 2 diabetes, sciatica. Diagnosed with COVID in July 2021.   Patient presents today for televisit for post COVID care clinic visit. Patient states that she had Covid in July 2021. She continues to experience hair loss and memory loss after Covid. We discussed that hair shedding is normal after a viral illness. This can take a few months to resolve. We discussed the possibility of a referral to speech therapy for cognitive rehabilitation. Patient declines at this time but states that she will call us back if her symptoms worsen. We discussed that she can use a couple of free apps: Luminosity and elevate to help with cognitive rehabilitation on her own. Denies f/c/s, n/v/d, hemoptysis, PND, chest pain or edema.      Observations/Objective:  Vitals with BMI 05/25/2020 04/26/2020 04/12/2020  Height 4' 11.2" 4' 11.2" 4' 11.2"  Weight 141 lbs 137 lbs 135 lbs 6 oz  BMI 28.27 54.62 70.35  Systolic 009 381 829  Diastolic 89 76 72  Pulse 95 92 77      Assessment and Plan:  History of Covid Telogen effluvium:  A few months after having Covid, many people see noticeable hair loss.  While many people think of this as hair loss, it's actually hair shedding. The medical name for this type of hair shedding is telogen effluvium. It happens when more hairs  than normal enter the shedding (telogen) phase of the hair growth lifecycle at the same time. A fever or illness can force more hairs into the shedding phase.  Most people see noticeable hair shedding two to three months after having a fever or illness. Handfuls of hair can come out when you shower or brush your hair. This hair shedding can last for six to nine months before it stops. Most people then see their hair start to look normal again and stop shedding.  May trial multivitamin  Avoid any harsh hair treatments    Memory Loss:  Offered referral to speech therapy for cognitive rehabilitation - patient declines at this time, but will call back if symptoms persist or worsen    Follow Up Instructions:  Follow up if symptoms persist or worsen    I discussed the assessment and treatment plan with the patient. The patient was provided an opportunity to ask questions and all were answered. The patient agreed with the plan and demonstrated an understanding of the instructions.   The patient was advised to call back or seek an in-person evaluation if the symptoms worsen or if the condition fails to improve as anticipated.  I provided 22 minutes of non-face-to-face time during this encounter.   Fenton Foy, NP

## 2020-06-01 NOTE — Patient Instructions (Signed)
History of Covid Telogen effluvium:  A few months after having Covid, many people see noticeable hair loss.  While many people think of this as hair loss, it's actually hair shedding. The medical name for this type of hair shedding is telogen effluvium. It happens when more hairs than normal enter the shedding (telogen) phase of the hair growth lifecycle at the same time. A fever or illness can force more hairs into the shedding phase.  Most people see noticeable hair shedding two to three months after having a fever or illness. Handfuls of hair can come out when you shower or brush your hair. This hair shedding can last for six to nine months before it stops. Most people then see their hair start to look normal again and stop shedding.  May trial multivitamin  Avoid any harsh hair treatments    Memory Loss:  Offered referral to speech therapy for cognitive rehabilitation - patient declines at this time, but will call back if symptoms persist or worsen

## 2020-06-09 ENCOUNTER — Other Ambulatory Visit (HOSPITAL_COMMUNITY): Payer: Self-pay | Admitting: Optometrist

## 2020-06-09 MED FILL — RESTASIS 0.05% EYE EMULSION: 0.05 | 90 days supply | Qty: 180 | Fill #0

## 2020-06-18 ENCOUNTER — Other Ambulatory Visit: Payer: Self-pay

## 2020-06-18 DIAGNOSIS — Z5189 Encounter for other specified aftercare: Secondary | ICD-10-CM | POA: Insufficient documentation

## 2020-06-18 DIAGNOSIS — R7302 Impaired glucose tolerance (oral): Secondary | ICD-10-CM | POA: Insufficient documentation

## 2020-06-18 DIAGNOSIS — R112 Nausea with vomiting, unspecified: Secondary | ICD-10-CM | POA: Insufficient documentation

## 2020-06-18 DIAGNOSIS — K8070 Calculus of gallbladder and bile duct without cholecystitis without obstruction: Secondary | ICD-10-CM | POA: Insufficient documentation

## 2020-06-18 DIAGNOSIS — Z8489 Family history of other specified conditions: Secondary | ICD-10-CM | POA: Insufficient documentation

## 2020-06-18 DIAGNOSIS — D649 Anemia, unspecified: Secondary | ICD-10-CM | POA: Insufficient documentation

## 2020-06-18 DIAGNOSIS — Z9889 Other specified postprocedural states: Secondary | ICD-10-CM | POA: Insufficient documentation

## 2020-06-18 DIAGNOSIS — K851 Biliary acute pancreatitis without necrosis or infection: Secondary | ICD-10-CM | POA: Insufficient documentation

## 2020-06-21 ENCOUNTER — Ambulatory Visit: Payer: No Typology Code available for payment source | Admitting: Cardiology

## 2020-06-21 ENCOUNTER — Other Ambulatory Visit: Payer: Self-pay | Admitting: Cardiology

## 2020-06-21 ENCOUNTER — Other Ambulatory Visit: Payer: Self-pay

## 2020-06-21 ENCOUNTER — Encounter: Payer: Self-pay | Admitting: Cardiology

## 2020-06-21 VITALS — BP 130/78 | HR 86 | Ht 62.0 in | Wt 139.1 lb

## 2020-06-21 DIAGNOSIS — E782 Mixed hyperlipidemia: Secondary | ICD-10-CM

## 2020-06-21 DIAGNOSIS — E119 Type 2 diabetes mellitus without complications: Secondary | ICD-10-CM | POA: Diagnosis not present

## 2020-06-21 DIAGNOSIS — E781 Pure hyperglyceridemia: Secondary | ICD-10-CM

## 2020-06-21 MED ORDER — ROSUVASTATIN CALCIUM 10 MG PO TABS: 10.0000 mg | ORAL_TABLET | Freq: Every day | ORAL | 3 refills | Status: DC

## 2020-06-21 MED FILL — ROSUVASTATIN CALCIUM 10 MG: 10 | 90 days supply | Qty: 90 | Fill #0

## 2020-06-21 NOTE — Patient Instructions (Addendum)
Medication Instructions:  Your physician has recommended you make the following change in your medication:   Start rosuvastatin 10 mg daily.  *If you need a refill on your cardiac medications before your next appointment, please call your pharmacy*   Lab Work: Your physician recommends that you return for lab work in: before your next visit. You need to have labs done when you are fasting.  You can come Monday through Friday 8:30 am to 12:00 pm and 1:15 to 4:30. You do not need to make an appointment as the order has already been placed. The labs you are going to have done are BMET,LFT and Lipids.  If you have labs (blood work) drawn today and your tests are completely normal, you will receive your results only by: [name] MyChart Message (if you have MyChart) OR . A paper copy in the mail If you have any lab test that is abnormal or we need to change your treatment, we will call you to review the results.   Testing/Procedures: None ordered   Follow-Up: At [facility], you and your health needs are our priority.  As part of our continuing mission to provide you with exceptional heart care, we have created designated Provider Care Teams.  These Care Teams include your primary Cardiologist (physician) and Advanced Practice Providers (APPs -  Physician Assistants and Nurse Practitioners) who all work together to provide you with the care you need, when you need it.  We recommend signing up for the patient portal called "MyChart".  Sign up information is provided on this After Visit Summary.  MyChart is used to connect with patients for Virtual Visits (Telemedicine).  Patients are able to view lab/test results, encounter notes, upcoming appointments, etc.  Non-urgent messages can be sent to your provider as well.   To learn more about what you can do with MyChart, go to [email or web address].    Your next appointment:   2 month(s)  The format for your next appointment:   In  Person  Provider:   Jyl Heinz, MD   Other Instructions Rosuvastatin capsules What is this medicine? ROSUVASTATIN (roe SOO va sta tin) is known as an HMG-CoA reductase inhibitor or 'statin'. It lowers cholesterol and triglycerides in the blood. Diet and lifestyle changes are often used with this drug. This medicine may be used for other purposes; ask your health care provider or pharmacist if you have questions. COMMON BRAND NAME(S): Ezallor What should I tell my health care provider before I take this medicine? They need to know if you have any of these conditions:  diabetes  if you often drink alcohol  history of stroke  kidney disease  liver disease  muscle aches or weakness  thyroid disease  an unusual or allergic reaction to rosuvastatin, other medicines, foods, dyes, or preservatives  pregnant or trying to get pregnant  breast-feeding How should I use this medicine? Take this medicine by mouth with a glass of water. Follow the directions on the prescription label. Swallow the capsules whole. Do not crush or chew this medicine. You may open the capsule and put the contents in 1 teaspoon of applesauce, chocolate pudding, or vanilla pudding. Swallow the medicine with applesauce or pudding within 60 minutes (1 hour). Do not chew the medicine, applesauce, or pudding. You can take this medicine with or without food. Take your doses at regular intervals. Do not take your medicine more often than directed. Talk to your pediatrician about the use of this medicine in children. Special  care may be needed. Overdosage: If you think you have taken too much of this medicine contact a poison control center or emergency room at once. NOTE: This medicine is only for you. Do not share this medicine with others. What if I miss a dose? If you miss a dose, take it as soon as you can. If your next dose is to be taken in less than 12 hours, then do not take the missed dose. Take the next  dose at your regular time. Do not take double or extra doses. What may interact with this medicine? Do not take this medicine with any of the following medications:  herbal medicines like red yeast rice This medicine may also interact with the following medications:  alcohol  antacids containing aluminum hydroxide or magnesium hydroxide  cyclosporine  other medicines for high cholesterol  some medicines for HIV infection  warfarin This list may not describe all possible interactions. Give your health care provider a list of all the medicines, herbs, non-prescription drugs, or dietary supplements you use. Also tell them if you smoke, drink alcohol, or use illegal drugs. Some items may interact with your medicine. What should I watch for while using this medicine? Visit your doctor or health care professional for regular check-ups. You may need regular tests to make sure your liver is working properly. Your health care professional may tell you to stop taking this medicine if you develop muscle problems. If your muscle problems do not go away after stopping this medicine, contact your health care professional. Do not become pregnant while taking this medicine. Women should inform their health care professional if they wish to become pregnant or think they might be pregnant. There is a potential for serious side effects to an unborn child. Talk to your health care professional or pharmacist for more information. Do not breast-feed an infant while taking this medicine. This medicine may increase blood sugar. Ask your healthcare provider if changes in diet or medicines are needed if you have diabetes. If you are going to need surgery or other procedure, tell your doctor that you are using this medicine. This drug is only part of a total heart-health program. Your doctor or a dietician can suggest a low-cholesterol and low-fat diet to help. Avoid alcohol and smoking, and keep a proper exercise  schedule. This medicine may cause a decrease in Co-Enzyme Q-10. You should make sure that you get enough Co-Enzyme Q-10 while you are taking this medicine. Discuss the foods you eat and the vitamins you take with your health care professional. What side effects may I notice from receiving this medicine? Side effects that you should report to your doctor or health care professional as soon as possible:  allergic reactions like skin rash, itching or hives, swelling of the face, lips, or tongue  dark urine  fever  joint pain  muscle cramps, pain  redness, blistering, peeling or loosening of the skin, including inside the mouth  signs and symptoms of high blood sugar such as being more thirsty or hungry or having to urinate more than normal. You may also feel very tired or have blurry vision.  trouble passing urine or change in the amount of urine  unusually weak  yellowing of the eyes or skin Side effects that usually do not require medical attention (report these to your doctor or health care professional if they continue or are bothersome):  constipation  heartburn  nausea  stomach gas, pain, upset This list may not  describe all possible side effects. Call your doctor for medical advice about side effects. You may report side effects to FDA at 1-800-FDA-1088. Where should I keep my medicine? Keep out of the reach of children. Store at room temperature between 20 and 25 degrees C (68 and 77 degrees F). Keep container tightly closed (protect from moisture). Throw away any unused medicine after the expiration date. NOTE: This sheet is a summary. It may not cover all possible information. If you have questions about this medicine, talk to your doctor, pharmacist, or health care provider.  2020 Elsevier/Gold Standard (2018-11-05 10:34:28)

## 2020-06-21 NOTE — Addendum Note (Signed)
Addended by: Truddie Hidden on: 06/21/2020 09:54 AM   Modules accepted: Orders

## 2020-06-21 NOTE — [place] Notes (Signed)
Cardiology Office Note:    Date:  06/21/2020   ID:  Nichole Wilson, DOB [birth year], MRN [id number]  PCP:  [name], FNP  Cardiologist:  Jenean Lindau, MD   Referring MD: Carol Ada*    ASSESSMENT:    1. Hypercholesterolemia with hypertriglyceridemia   2. Type 2 diabetes mellitus without complication, without long-term current use of insulin (HCC)    PLAN:    In order of problems listed above:  1. Primary prevention stressed with the patient.  Importance of compliance with diet medication stressed and she vocalized understanding. 2. Mixed dyslipidemia: Diet was emphasized.  I have added rosuvastatin 10 mg to her regimen.  I reviewed liver function tests and they have been consistently fine.  She will be back in 6 weeks for liver lipid check. 3. Diabetes mellitus: Diet was emphasized weight reduction was stressed importance of regular exercise stressed she promises to walk at least half an hour a day 5 days a week and do well with diet. 4. Appointment in 2 months or earlier if she has any concerns.  Patient had multiple questions which were answered to her satisfaction.   Medication Adjustments/Labs and Tests Ordered: Current medicines are reviewed at length with the patient today.  Concerns regarding medicines are outlined above.  No orders of the defined types were placed in this encounter.  No orders of the defined types were placed in this encounter.    No chief complaint on file.    History of Present Illness:    Nichole Wilson is a 59 y.o. female.  Patient has past medical history of mixed hyperlipidemia and diabetes mellitus.  Her triglycerides are markedly elevated.  Patient mentions to me that she was sick with the delta.  Therefore did not take good care of herself and her blood markers were markedly elevated.  No chest pain orthopnea or PND.  Her blood sugar was also elevated and hemoglobin A1c was elevated 2.  Now she is taking better care of herself  and eating right.  At the time of my evaluation, the patient is alert awake oriented and in no distress.  Past Medical History:  Diagnosis Date  . Abnormal LFTs 10/31/2018  . ANA positive 08/06/2017  . Anal fissure - posterior 11/26/2015  . Anemia   . Anterior scleritis of both eyes 05/12/2015   Followed by Dr Teola Bradley - Last OV 11/10, 11/18 - Update 11/18 - dx Acute toxic conjunctivitis > improved s/p prednisolone - Primary iridocyclitis (R-eye) - begin taper of prednisolone eye drops, much improved - Requested labs all resulted 11/16: ESR (21), ANA (neg), RPR (non-reactive, no FTA-Ab) >> faxed results back to Dr Sharen Counter   . Blood transfusion without reported diagnosis   . Breast cancer ([place]) 2017   Right Breast Cancer  . Breast cancer of upper-outer quadrant of right female breast ([place]) 08/26/2015  . Breast cyst, left 01-04-12   cyst no longer apparent  . Cholelithiasis with choledocholithiasis   . Chronic calculus cholecystitis 12/25/2011   Lap chole on 03/25/12   . Complication of anesthesia 2002   PT STATES DURING HER C-SECTION SHE WAS AWARE OF INCISION AND PAIN -THEN WAS QUICKLY "PUT OUT"  . COVID-19 long hauler manifesting chronic concentration deficit 06/01/2020  . Elevated alkaline phosphatase level 08/07/2018   Normal Alk Phos Isoenzyme  . Essential hypertension 01/04/2016  . Family history of adverse reaction to anesthesia    father became aggressive  . Fatigue 07/22/2015  . Gallstone pancreatitis   .  Gastric mass GIST vs leiomyoma 12/01/2015  . Glucose intolerance (impaired glucose tolerance)    since at least 2011.   [name] Health care maintenance 03/24/2013  . Hepatic steatosis   . History of COVID-19 06/01/2020  . Hx of adenomatous polyp of colon 12/12/2018  . Hypercholesterolemia with hypertriglyceridemia   . Hypertension 01-04-12   med d/c-last taken 12-07-11  -PT [place] STOPPED HER B/P MEDICATION BECAUSE HER B/P TOO LOW AND PT STATES HER B/P'S HAVE BEEN OK SINCE.    [name] Hypertriglyceridemia   . Iron deficiency anemia 10/25/2018  . Migraine without aura, without mention of intractable migraine without mention of status migrainosus   . Overweight (BMI 25.0-29.9) 05/12/2015  . Plantar fasciitis 09/05/2013  . Pneumonia due to COVID-19 virus 02/09/2020  . PONV (postoperative nausea and vomiting)    patient states that she did not get sick with mastectomy  . Pre-diabetes   . Sciatica 05/17/2012   Patient with left leg radicular lumbar pain.  No concerning symptoms aside from pain. Plan: Trial of steroid dosepak and tramadol as needed for pain Discussed warning signs or symptoms. Please see discharge instructions. Patient expresses understanding. F/u 2 weeks if not improved.    . Telogen effluvium 06/01/2020  . Type 2 diabetes mellitus with complication, without long-term current use of insulin ([place]) 05/12/2015  . Type 2 diabetes mellitus without complication, without long-term current use of insulin ([place]) 05/12/2015    Past Surgical History:  Procedure Laterality Date  . CESAREAN SECTION     2002  . CHOLECYSTECTOMY  03/25/2012   Procedure: LAPAROSCOPIC CHOLECYSTECTOMY WITH INTRAOPERATIVE CHOLANGIOGRAM;  Surgeon: Haywood Lasso, MD;  Location: [facility];  Service: General;  Laterality: N/A;  Laparoscopic Cholecystectomy with Intraoperative Cholangiogram  . COLONOSCOPY    . ERCP  12/05/2011   Procedure: ENDOSCOPIC RETROGRADE CHOLANGIOPANCREATOGRAPHY (ERCP);  Surgeon: Inda Castle, MD;  Location: [facility];  Service: Endoscopy;  Laterality: N/A;  . ERCP  12/08/2011   Procedure: ENDOSCOPIC RETROGRADE CHOLANGIOPANCREATOGRAPHY (ERCP);  Surgeon: Inda Castle, MD;  Location: [place] [facility];  Service: Gastroenterology;  Laterality: N/A;  . ESOPHAGOGASTRODUODENOSCOPY (EGD) WITH PROPOFOL N/A 09/28/2015   Procedure: ESOPHAGOGASTRODUODENOSCOPY (EGD) WITH PROPOFOL;  Surgeon: Clarene Essex, MD;  Location: [facility];  Service: Endoscopy;  Laterality: N/A;  H AND P IN DRAWER  .  EUS N/A 10/13/2015   Procedure: UPPER ENDOSCOPIC ULTRASOUND (EUS) RADIAL;  Surgeon: Arta Silence, MD;  Location: [facility];  Service: Endoscopy;  Laterality: N/A;  . LAPAROSCOPIC PARTIAL GASTRECTOMY N/A 01/11/2016   Procedure: LAPAROSCOPIC PARTIAL GASTRECTOMY;  Surgeon: [name], MD;  Location: [facility];  Service: General;  Laterality: N/A;  . MASTECTOMY Right 09/2015  . MASTECTOMY W/ SENTINEL NODE BIOPSY Right 09/15/2015   Procedure: RIGHT MASTECTOMY WITH SENTINEL LYMPH NODE BIOPSY;  Surgeon: Rolm Bookbinder, MD;  Location: [facility];  Service: General;  Laterality: Right;  . UPPER GI ENDOSCOPY N/A 01/11/2016   Procedure: UPPER GI ENDOSCOPY;  Surgeon: [name], MD;  Location: [facility];  Service: General;  Laterality: N/A;    Current Medications: Current Meds  Medication Sig  . albuterol (VENTOLIN HFA) 108 (90 Base) MCG/ACT inhaler Inhale 4 puffs into the lungs every 6 (six) hours as needed.  Jolyne Loa Grape-Goldenseal (BERBERINE COMPLEX PO) Take 1 capsule by mouth 3 (three) times daily.  . Biotin 10000 MCG TABS Take 10,000 mcg by mouth 2 (two) times daily.  . Cholecalciferol (VITAMIN D3) 10000 units capsule Take 10,000 Units by mouth daily.  [name]  fenofibrate (TRICOR) 145 MG tablet Take 1 tablet (145 mg total) by mouth daily.  [name] Ethyl (VASCEPA) 1 g CAPS Take 2 capsules (2 g total) by mouth 2 (two) times daily. Started 02/17/2019  . pantoprazole (PROTONIX) 40 MG tablet TAKE 1 TABLET BY MOUTH ONCE A DAY BEFORE BREAKFAST  . pyridOXINE (VITAMIN B-6) 100 MG tablet Take 100 mg by mouth daily.  . RESTASIS 0.05 % ophthalmic emulsion Place 1 drop into both eyes 2 (two) times daily.   . Thiamine HCl (VITAMIN B-1) 250 MG tablet Take 250 mg by mouth daily.  . vitamin B-12 (CYANOCOBALAMIN) 1000 MCG tablet Take 1,000 mcg by mouth daily.  . vitamin E 1000 UNIT capsule Take 1,000 Units by mouth daily.  [name] zinc gluconate 50 MG tablet Take 50 mg by mouth daily.     Allergies:   Lisinopril and  Other   Social History   Socioeconomic History  . Marital status: Married    Spouse name: Not on file  . Number of children: 2  . Years of education: Not on file  . Highest education level: Not on file  Occupational History  . 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   Tobacco Use  . Smoking status: Never Smoker  . Smokeless tobacco: Never Used  Vaping Use  . Vaping Use: Never used  Substance and Sexual Activity  . Alcohol use: No  . Drug use: No  . Sexual activity: Not Currently  Other Topics Concern  . Not on file  Social History Narrative   Married, husband is a Dealer   2 young adult daughters   - spanish interpreter   + children   No caffeine   Social Determinants of Radio broadcast assistant Strain:   . Difficulty of Paying Living Expenses: Not on file  Food Insecurity:   . Worried About Charity fundraiser in the Last Year: Not on file  . Ran Out of Food in the Last Year: Not on file  Transportation Needs:   . Lack of Transportation (Medical): Not on file  . Lack of Transportation (Non-Medical): Not on file  Physical Activity:   . Days of Exercise per Week: Not on file  . Minutes of Exercise per Session: Not on file  Stress:   . Feeling of Stress : Not on file  Social Connections:   . Frequency of Communication with Friends and Family: Not on file  . Frequency of Social Gatherings with Friends and Family: Not on file  . Attends Religious Services: Not on file  . Active Member of Clubs or Organizations: Not on file  . Attends Archivist Meetings: Not on file  . Marital Status: Not on file     Family History: The patient's family history includes Diabetes in her brother, father, and mother; Heart disease in her paternal uncle; Liver disease in her brother. There is no history of Colon cancer, Esophageal cancer, Rectal cancer, or Stomach cancer.  ROS:   Please see  the history of present illness.    All other systems reviewed and are negative.  EKGs/Labs/Other Studies Reviewed:    The following studies were reviewed today: I discussed my findings with the patient at length.   Recent Labs: 04/12/2020: ALT 14; BUN 10; Creatinine, Ser 0.52; Hemoglobin 13.0; Platelets 319; Potassium 4.7; Sodium 141 04/26/2020: TSH 1.160  Recent Lipid Panel    Component  Value Date/Time   CHOL 280 (H) 04/26/2020 1027   TRIG 421 (H) 04/26/2020 1027   HDL 52 04/26/2020 1027   CHOLHDL 5.4 (H) 04/26/2020 1027   CHOLHDL 5 05/15/2018 0938   VLDL 71.2 (H) 05/15/2018 0938   LDLCALC 149 (H) 04/26/2020 1027   LDLDIRECT 132.0 05/15/2018 0938    Physical Exam:    VS:  BP 130/78   Pulse 86   Ht 5' 2"  (1.575 m)   Wt 139 lb 1.3 oz (63.1 kg)   LMP 11/29/2011   SpO2 99%   BMI 25.44 kg/m     Wt Readings from Last 3 Encounters:  06/21/20 139 lb 1.3 oz (63.1 kg)  05/25/20 141 lb (64 kg)  04/26/20 137 lb (62.1 kg)     GEN: Patient is in no acute distress HEENT: Normal NECK: No JVD; No carotid bruits LYMPHATICS: No lymphadenopathy CARDIAC: Hear sounds regular, 2/6 systolic murmur at the apex. RESPIRATORY:  Clear to auscultation without rales, wheezing or rhonchi  ABDOMEN: Soft, non-tender, non-distended MUSCULOSKELETAL:  No edema; No deformity  SKIN: Warm and dry NEUROLOGIC:  Alert and oriented x 3 PSYCHIATRIC:  Normal affect   Signed, Jenean Lindau, MD  06/21/2020 9:37 AM    [place]

## 2020-06-22 ENCOUNTER — Other Ambulatory Visit: Payer: Self-pay | Admitting: Cardiology

## 2020-06-22 MED FILL — VASCEPA 1 GM CAPSULE: 1 | 90 days supply | Qty: 360 | Fill #0

## 2020-08-30 ENCOUNTER — Ambulatory Visit (INDEPENDENT_AMBULATORY_CARE_PROVIDER_SITE_OTHER): Payer: No Typology Code available for payment source | Admitting: Nurse Practitioner

## 2020-08-30 ENCOUNTER — Encounter: Payer: Self-pay | Admitting: Nurse Practitioner

## 2020-08-30 ENCOUNTER — Other Ambulatory Visit: Payer: Self-pay

## 2020-08-30 VITALS — BP 118/86 | HR 76 | Temp 98.1°F | Ht 62.0 in | Wt 141.0 lb

## 2020-08-30 DIAGNOSIS — E781 Pure hyperglyceridemia: Secondary | ICD-10-CM

## 2020-08-30 DIAGNOSIS — I10 Essential (primary) hypertension: Secondary | ICD-10-CM

## 2020-08-30 DIAGNOSIS — Z5189 Encounter for other specified aftercare: Secondary | ICD-10-CM | POA: Insufficient documentation

## 2020-08-30 DIAGNOSIS — E1169 Type 2 diabetes mellitus with other specified complication: Secondary | ICD-10-CM | POA: Diagnosis not present

## 2020-08-30 LAB — POCT UA - MICROALBUMIN
Albumin/Creatinine Ratio, Urine, POC: <30
Creatinine, POC: 100 mg/dL
Microalbumin Ur, POC: 10 mg/L

## 2020-08-30 NOTE — Progress Notes (Signed)
I,Yamilka Roman [employer] as a Education administrator for [employer], FNP.,have documented all relevant documentation on the behalf of [name], FNP,as directed by  [name], FNP while in the presence of [name], [place]. This visit occurred during the SARS-CoV-2 public health emergency.  Safety protocols were in place, including screening questions prior to the visit, additional usage of staff PPE, and extensive cleaning of exam room while observing appropriate contact time as indicated for disinfecting solutions.  Subjective:     Patient ID: Nichole Wilson , female    DOB: 10-Jan-1961 , 60 y.o.   MRN: [id number]   Chief Complaint  Patient presents with  . Hypertension    Patient stated her cardiologist ordered some bloodwork for her to have done she is supposed to have a f/u appointment with him tomorrow.     HPI  Patient here for a blood pressure f/u.  He is being followed by Cardiologist. She is taking glucobalance and Berberin  Wt Readings from Last 3 Encounters: 08/30/20 : 141 lb (64 kg) 06/21/20 : 139 lb 1.3 oz (63.1 kg) 05/25/20 : 141 lb (64 kg)  Hypertension This is a chronic problem. The current episode started more than 1 year ago. The problem is unchanged. The problem is controlled. There are no known risk factors for coronary artery disease. Past treatments include nothing. There are no compliance problems.  There is no history of angina. There is no history of chronic renal disease.  Diabetes She presents for her follow-up diabetic visit. She has type 2 diabetes mellitus. Her disease course has been stable. There are no diabetic associated symptoms.     Past Medical History:  Diagnosis Date  . Abnormal LFTs 10/31/2018  . ANA positive 08/06/2017  . Anal fissure - posterior 11/26/2015  . Anemia   . Anterior scleritis of both eyes 05/12/2015   Followed by Dr Teola Bradley - Last OV 11/10, 11/18 - Update 11/18 - dx Acute toxic conjunctivitis > improved s/p prednisolone - Primary  iridocyclitis (R-eye) - begin taper of prednisolone eye drops, much improved - Requested labs all resulted 11/16: ESR (21), ANA (neg), RPR (non-reactive, no FTA-Ab) >> faxed results back to Dr Sharen Counter   . Blood transfusion without reported diagnosis   . Breast cancer ([place]) 2017   Right Breast Cancer  . Breast cancer of upper-outer quadrant of right female breast ([place]) 08/26/2015  . Breast cyst, left 01-04-12   cyst no longer apparent  . Cholelithiasis with choledocholithiasis   . Chronic calculus cholecystitis 12/25/2011   Lap chole on 03/25/12   . Complication of anesthesia 2002   PT STATES DURING HER C-SECTION SHE WAS AWARE OF INCISION AND PAIN -THEN WAS QUICKLY "PUT OUT"  . COVID-19 long hauler manifesting chronic concentration deficit 06/01/2020  . Elevated alkaline phosphatase level 08/07/2018   Normal Alk Phos Isoenzyme  . Essential hypertension 01/04/2016  . Family history of adverse reaction to anesthesia    father became aggressive  . Fatigue 07/22/2015  . Gallstone pancreatitis   . Gastric mass GIST vs leiomyoma 12/01/2015  . Glucose intolerance (impaired glucose tolerance)    since at least 2011.   [name] Health care maintenance 03/24/2013  . Hepatic steatosis   . History of COVID-19 06/01/2020  . Hx of adenomatous polyp of colon 12/12/2018  . Hypercholesterolemia with hypertriglyceridemia   . Hypertension 01-04-12   med d/c-last taken 12-07-11  -[place] STOPPED HER B/P MEDICATION BECAUSE HER B/P TOO LOW AND PT STATES HER  B/P'S HAVE BEEN OK SINCE.  [name] Hypertriglyceridemia   . Iron deficiency anemia 10/25/2018  . Migraine without aura, without mention of intractable migraine without mention of status migrainosus   . Overweight (BMI 25.0-29.9) 05/12/2015  . Plantar fasciitis 09/05/2013  . Pneumonia due to COVID-19 virus 02/09/2020  . PONV (postoperative nausea and vomiting)    patient states that she did not get sick with mastectomy  . Pre-diabetes   . Sciatica 05/17/2012    Patient with left leg radicular lumbar pain.  No concerning symptoms aside from pain. Plan: Trial of steroid dosepak and tramadol as needed for pain Discussed warning signs or symptoms. Please see discharge instructions. Patient expresses understanding. F/u 2 weeks if not improved.    . Telogen effluvium 06/01/2020  . Type 2 diabetes mellitus with complication, without long-term current use of insulin ([place]) 05/12/2015  . Type 2 diabetes mellitus without complication, without long-term current use of insulin ([place]) 05/12/2015     Family History  Problem Relation Age of Onset  . Diabetes Mother   . Diabetes Father   . Diabetes Brother   . Heart disease Paternal Uncle   . Liver disease Brother   . Colon cancer Neg Hx   . Esophageal cancer Neg Hx   . Rectal cancer Neg Hx   . Stomach cancer Neg Hx      Current Outpatient Medications:  .  Barberry-Oreg Grape-Goldenseal (BERBERINE COMPLEX PO), Take 1 capsule by mouth 3 (three) times daily., Disp: , Rfl:  .  Biotin 10000 MCG TABS, Take 10,000 mcg by mouth 2 (two) times daily., Disp: , Rfl:  .  Cholecalciferol (VITAMIN D3) 10000 units capsule, Take 10,000 Units by mouth daily., Disp: , Rfl:  .  fenofibrate (TRICOR) 145 MG tablet, Take 1 tablet (145 mg total) by mouth daily., Disp: 90 tablet, Rfl: 3 .  pantoprazole (PROTONIX) 40 MG tablet, TAKE 1 TABLET BY MOUTH ONCE A DAY BEFORE BREAKFAST, Disp: 90 tablet, Rfl: 3 .  RESTASIS 0.05 % ophthalmic emulsion, Place 1 drop into both eyes 2 (two) times daily. , Disp: , Rfl: 2 .  rosuvastatin (CRESTOR) 10 MG tablet, Take 1 tablet (10 mg total) by mouth daily., Disp: 90 tablet, Rfl: 3 .  VASCEPA 1 g capsule, TAKE 2 CAPSULES BY MOUTH TWICE DAILY, Disp: 360 capsule, Rfl: 3 .  vitamin E 1000 UNIT capsule, Take 1,000 Units by mouth daily., Disp: , Rfl:  .  albuterol (VENTOLIN HFA) 108 (90 Base) MCG/ACT inhaler, Inhale 4 puffs into the lungs every 6 (six) hours as needed. (Patient not taking: Reported on  08/30/2020), Disp: , Rfl:  .  pyridOXINE (VITAMIN B-6) 100 MG tablet, Take 100 mg by mouth daily. (Patient not taking: Reported on 08/30/2020), Disp: , Rfl:  .  Thiamine HCl (VITAMIN B-1) 250 MG tablet, Take 250 mg by mouth daily. (Patient not taking: Reported on 08/30/2020), Disp: , Rfl:  .  vitamin B-12 (CYANOCOBALAMIN) 1000 MCG tablet, Take 1,000 mcg by mouth daily. (Patient not taking: Reported on 08/30/2020), Disp: , Rfl:  .  zinc gluconate 50 MG tablet, Take 50 mg by mouth daily. (Patient not taking: Reported on 08/30/2020), Disp: , Rfl:    Allergies  Allergen Reactions  . Lisinopril Swelling  . Other     General anesthesia-severe vomiting     Review of Systems  Constitutional: Negative.   HENT: Negative.   Eyes: Negative.   Respiratory: Negative.   Cardiovascular: Negative.   Gastrointestinal: Negative.   Endocrine: Negative.  Genitourinary: Negative.   Musculoskeletal: Negative.   Skin: Negative.   Neurological: Negative.   Hematological: Negative.   Psychiatric/Behavioral: Negative.      Today's Vitals   08/30/20 0826  BP: 118/86  Pulse: 76  Temp: 98.1 F (36.7 C)  TempSrc: Oral  Weight: 141 lb (64 kg)  Height: 5' 2"  (1.575 m)  PainSc: 0-No pain   Body mass index is 25.79 kg/m.   Objective:  Physical Exam Constitutional:      General: She is not in acute distress.    Appearance: Normal appearance. She is normal weight.  Cardiovascular:     Rate and Rhythm: Normal rate and regular rhythm.     Pulses: Normal pulses.     Heart sounds: Normal heart sounds.  Pulmonary:     Effort: Pulmonary effort is normal.     Breath sounds: Normal breath sounds.  Abdominal:     General: Abdomen is flat. Bowel sounds are normal.     Palpations: Abdomen is soft.  Musculoskeletal:        General: Normal range of motion.     Cervical back: Normal range of motion and neck supple.  Skin:    General: Skin is warm and dry.     Capillary Refill: Capillary refill takes less  than 2 seconds.  Neurological:     General: No focal deficit present.     Mental Status: She is alert and oriented to person, place, and time.  Psychiatric:        Mood and Affect: Mood normal.        Behavior: Behavior normal.        Thought Content: Thought content normal.        Judgment: Judgment normal.         Assessment And Plan:     1. Essential hypertension  . B/P is well controlled.  . CMP ordered to check renal function.  . The importance of regular exercise and dietary modification was stressed to the patient.  . Stressed importance of losing ten percent of her body weight to help with B/P control.  - CMP14+EGFR  2. Hypertriglyceridemia  Chronic, encouraged to avoid breads and sweets  Will check lipid panel - CMP14+EGFR - Lipid panel  3. Controlled type 2 diabetes mellitus with other specified complication, without long-term current use of insulin (HCC)  Chronic, no current medications  Pending labs will restart medication - Hemoglobin A1c     Patient was given opportunity to ask questions. Patient verbalized understanding of the plan and was able to repeat key elements of the plan. All questions were answered to their satisfaction.  [name], FNP   I, [name], FNP, have reviewed all documentation for this visit. The documentation on 08/30/20 for the exam, diagnosis, procedures, and orders are all accurate and complete.  THE PATIENT IS ENCOURAGED TO PRACTICE SOCIAL DISTANCING DUE TO THE COVID-19 PANDEMIC.

## 2020-08-30 NOTE — Patient Instructions (Signed)

## 2020-08-31 ENCOUNTER — Other Ambulatory Visit: Payer: Self-pay | Admitting: Cardiology

## 2020-08-31 ENCOUNTER — Ambulatory Visit: Payer: No Typology Code available for payment source | Admitting: Cardiology

## 2020-08-31 ENCOUNTER — Encounter: Payer: Self-pay | Admitting: Cardiology

## 2020-08-31 VITALS — BP 122/68 | HR 102 | Ht 62.0 in | Wt 142.0 lb

## 2020-08-31 DIAGNOSIS — I10 Essential (primary) hypertension: Secondary | ICD-10-CM | POA: Diagnosis not present

## 2020-08-31 DIAGNOSIS — E781 Pure hyperglyceridemia: Secondary | ICD-10-CM

## 2020-08-31 DIAGNOSIS — E119 Type 2 diabetes mellitus without complications: Secondary | ICD-10-CM | POA: Diagnosis not present

## 2020-08-31 DIAGNOSIS — E782 Mixed hyperlipidemia: Secondary | ICD-10-CM

## 2020-08-31 LAB — CMP14+EGFR
ALT: 17 IU/L (ref 0–32)
AST: 17 IU/L (ref 0–40)
Albumin/Globulin Ratio: 1.9 (ref 1.2–2.2)
Albumin: 4.7 g/dL (ref 3.8–4.9)
Alkaline Phosphatase: 129 IU/L — ABNORMAL HIGH (ref 44–121)
BUN/Creatinine Ratio: 20 (ref 9–23)
BUN: 13 mg/dL (ref 6–24)
Bilirubin Total: 0.4 mg/dL (ref 0.0–1.2)
CO2: 23 mmol/L (ref 20–29)
Calcium: 9.8 mg/dL (ref 8.7–10.2)
Chloride: 104 mmol/L (ref 96–106)
Creatinine, Ser: 0.66 mg/dL (ref 0.57–1.00)
GFR calc Af Amer: 113 mL/min/{1.73_m2} (ref >59)
GFR calc non Af Amer: 98 mL/min/{1.73_m2} (ref >59)
Globulin, Total: 2.5 g/dL (ref 1.5–4.5)
Glucose: 118 mg/dL — ABNORMAL HIGH (ref 65–99)
Potassium: 4.4 mmol/L (ref 3.5–5.2)
Sodium: 144 mmol/L (ref 134–144)
Total Protein: 7.2 g/dL (ref 6.0–8.5)

## 2020-08-31 LAB — HEMOGLOBIN A1C
Est. average glucose Bld gHb Est-mCnc: 143 mg/dL
Hgb A1c MFr Bld: 6.6 % — ABNORMAL HIGH (ref 4.8–5.6)

## 2020-08-31 LAB — LIPID PANEL
Chol/HDL Ratio: 2.6 ratio (ref 0.0–4.4)
Cholesterol, Total: 151 mg/dL (ref 100–199)
HDL: 58 mg/dL (ref >39)
LDL Chol Calc (NIH): 74 mg/dL (ref 0–99)
Triglycerides: 102 mg/dL (ref 0–149)
VLDL Cholesterol Cal: 19 mg/dL (ref 5–40)

## 2020-08-31 MED ORDER — ROSUVASTATIN CALCIUM 10 MG PO TABS: 10.0000 mg | ORAL_TABLET | Freq: Every day | ORAL | 3 refills | Status: DC

## 2020-08-31 MED ORDER — FENOFIBRATE 145 MG PO TABS
145.0000 mg | ORAL_TABLET | Freq: Every day | ORAL | 3 refills | Status: DC
Start: 2020-08-31 — End: 2020-08-31

## 2020-08-31 MED ORDER — ICOSAPENT ETHYL 1 G PO CAPS: 2.0000 g | ORAL_CAPSULE | Freq: Two times a day (BID) | ORAL | 3 refills | Status: DC

## 2020-08-31 MED FILL — FENOFIBRATE 145 MG TABS: 145 | 90 days supply | Qty: 90 | Fill #0

## 2020-08-31 NOTE — Progress Notes (Signed)
Cardiology Office Note:    Date:  08/31/2020   ID:  Nichole Wilson, DOB 06-Jun-1961, MRN [id number]  PCP:  [name], FNP  Cardiologist:  Jenean Lindau, MD   Referring MD: [name], FNP    ASSESSMENT:    1. Essential hypertension   2. Pure hyperglyceridemia   3. Hypercholesterolemia with hypertriglyceridemia   4. Type 2 diabetes mellitus without complication, without long-term current use of insulin (HCC)    PLAN:    In order of problems listed above:  1. Primary prevention stressed with the patient.  Importance of compliance with diet medication stressed and she vocalized understanding.  She exercises and walks at least half an hour on a daily basis and she is happy about it. 2. Essential hypertension: Blood pressure stable and diet was emphasized. 3. Mixed dyslipidemia: She is done remarkably well with diet and exercise and medications and her lipids are in great shape.  I discussed them with her at length.  I congratulated her. 4. Diabetes mellitus: Managed by primary care physician.  Diet was emphasized. 5. Patient will be seen in follow-up appointment in 6 months or earlier if the patient has any concerns.  She will have blood work before next visit.   Medication Adjustments/Labs and Tests Ordered: Current medicines are reviewed at length with the patient today.  Concerns regarding medicines are outlined above.  Orders Placed This Encounter  Procedures  . Basic metabolic panel  . CBC with Differential/Platelet  . Hepatic function panel  . Lipid panel  . TSH   Meds ordered this encounter  Medications  . icosapent Ethyl (VASCEPA) 1 g capsule    Sig: Take 2 capsules (2 g total) by mouth 2 (two) times daily.    Dispense:  360 capsule    Refill:  3  . rosuvastatin (CRESTOR) 10 MG tablet    Sig: Take 1 tablet (10 mg total) by mouth daily.    Dispense:  90 tablet    Refill:  3  . fenofibrate (TRICOR) 145 MG tablet    Sig: Take 1 tablet (145 mg total) by mouth  daily.    Dispense:  90 tablet    Refill:  3     No chief complaint on file.    History of Present Illness:    Nichole Wilson is a 60 y.o. female.  Patient has past medical history of essential hypertension, mixed dyslipidemia diabetes mellitus.  She had marked hypertriglyceridemia.  She denies any problems at this time and takes care of activities of daily living.  No chest pain orthopnea or PND.  She walks on a regular basis.  At the time of my evaluation, the patient is alert awake oriented and in no distress.  Past Medical History:  Diagnosis Date  . Abnormal LFTs 10/31/2018  . ANA positive 08/06/2017  . Anal fissure - posterior 11/26/2015  . Anemia   . Anterior scleritis of both eyes 05/12/2015   Followed by Dr Teola Bradley - Last OV 11/10, 11/18 - Update 11/18 - dx Acute toxic conjunctivitis > improved s/p prednisolone - Primary iridocyclitis (R-eye) - begin taper of prednisolone eye drops, much improved - Requested labs all resulted 11/16: ESR (21), ANA (neg), RPR (non-reactive, no FTA-Ab) >> faxed results back to Dr Sharen Counter   . Blood transfusion without reported diagnosis   . Breast cancer ([place]) 2017   Right Breast Cancer  . Breast cancer of upper-outer quadrant of right female breast ([place]) 08/26/2015  . Breast cyst, left  01-04-12   cyst no longer apparent  . Cholelithiasis with choledocholithiasis   . Chronic calculus cholecystitis 12/25/2011   Lap chole on 03/25/12   . Complication of anesthesia 2002   PT STATES DURING HER C-SECTION SHE WAS AWARE OF INCISION AND PAIN -THEN WAS QUICKLY "PUT OUT"  . Controlled type 2 diabetes mellitus with diabetic nephropathy, without long-term current use of insulin ([place]) 05/12/2015  . COVID-19 long hauler manifesting chronic concentration deficit 06/01/2020  . Elevated alkaline phosphatase level 08/07/2018   Normal Alk Phos Isoenzyme  . Essential hypertension 01/04/2016  . Family history of adverse reaction to anesthesia    father became aggressive   . Fatigue 07/22/2015  . Gallstone pancreatitis   . Gastric mass GIST vs leiomyoma 12/01/2015  . Glucose intolerance (impaired glucose tolerance)    since at least 2011.   [name] Health care maintenance 03/24/2013  . Hepatic steatosis   . History of COVID-19 06/01/2020  . Hx of adenomatous polyp of colon 12/12/2018  . Hypercholesterolemia with hypertriglyceridemia   . Hypertension 01-04-12   med d/c-last taken 12-07-11  -PT [place] STOPPED HER B/P MEDICATION BECAUSE HER B/P TOO LOW AND PT STATES HER B/P'S HAVE BEEN OK SINCE.  [name] Hypertriglyceridemia   . Iron deficiency anemia 10/25/2018  . Migraine without aura, without mention of intractable migraine without mention of status migrainosus   . Overweight (BMI 25.0-29.9) 05/12/2015  . Plantar fasciitis 09/05/2013  . Pneumonia due to COVID-19 virus 02/09/2020  . PONV (postoperative nausea and vomiting)    patient states that she did not get sick with mastectomy  . Pre-diabetes   . Sciatica 05/17/2012   Patient with left leg radicular lumbar pain.  No concerning symptoms aside from pain. Plan: Trial of steroid dosepak and tramadol as needed for pain Discussed warning signs or symptoms. Please see discharge instructions. Patient expresses understanding. F/u 2 weeks if not improved.    . Telogen effluvium 06/01/2020  . Type 2 diabetes mellitus with complication, without long-term current use of insulin ([place]) 05/12/2015  . Type 2 diabetes mellitus without complication, without long-term current use of insulin ([place]) 05/12/2015    Past Surgical History:  Procedure Laterality Date  . CESAREAN SECTION     2002  . CHOLECYSTECTOMY  03/25/2012   Procedure: LAPAROSCOPIC CHOLECYSTECTOMY WITH INTRAOPERATIVE CHOLANGIOGRAM;  Surgeon: Haywood Lasso, MD;  Location: [facility];  Service: General;  Laterality: N/A;  Laparoscopic Cholecystectomy with Intraoperative Cholangiogram  . COLONOSCOPY    . ERCP  12/05/2011   Procedure: ENDOSCOPIC RETROGRADE  CHOLANGIOPANCREATOGRAPHY (ERCP);  Surgeon: Inda Castle, MD;  Location: [facility];  Service: Endoscopy;  Laterality: N/A;  . ERCP  12/08/2011   Procedure: ENDOSCOPIC RETROGRADE CHOLANGIOPANCREATOGRAPHY (ERCP);  Surgeon: Inda Castle, MD;  Location: [facility];  Service: Gastroenterology;  Laterality: N/A;  . ESOPHAGOGASTRODUODENOSCOPY (EGD) WITH PROPOFOL N/A 09/28/2015   Procedure: ESOPHAGOGASTRODUODENOSCOPY (EGD) WITH PROPOFOL;  Surgeon: Clarene Essex, MD;  Location: [facility];  Service: Endoscopy;  Laterality: N/A;  H AND P IN DRAWER  . EUS N/A 10/13/2015   Procedure: UPPER ENDOSCOPIC ULTRASOUND (EUS) RADIAL;  Surgeon: Arta Silence, MD;  Location: [facility];  Service: Endoscopy;  Laterality: N/A;  . LAPAROSCOPIC PARTIAL GASTRECTOMY N/A 01/11/2016   Procedure: LAPAROSCOPIC PARTIAL GASTRECTOMY;  Surgeon: [name], MD;  Location: [facility];  Service: General;  Laterality: N/A;  . MASTECTOMY Right 09/2015  . MASTECTOMY W/ SENTINEL NODE BIOPSY Right 09/15/2015   Procedure: RIGHT MASTECTOMY WITH SENTINEL LYMPH NODE BIOPSY;  Surgeon: Rolm Bookbinder, MD;  Location: [facility];  Service: General;  Laterality: Right;  . UPPER GI ENDOSCOPY N/A 01/11/2016   Procedure: UPPER GI ENDOSCOPY;  Surgeon: [name], MD;  Location: [facility];  Service: General;  Laterality: N/A;    Current Medications: Current Meds  Medication Sig  . Barberry-Oreg Grape-Goldenseal (BERBERINE COMPLEX PO) Take 1 capsule by mouth 3 (three) times daily.  . Biotin 10000 MCG TABS Take 10,000 mcg by mouth 2 (two) times daily.  . Cholecalciferol (VITAMIN D3) 10000 units capsule Take 10,000 Units by mouth daily.  . pantoprazole (PROTONIX) 40 MG tablet TAKE 1 TABLET BY MOUTH ONCE A DAY BEFORE BREAKFAST  . RESTASIS 0.05 % ophthalmic emulsion Place 1 drop into both eyes 2 (two) times daily.   . vitamin E 1000 UNIT capsule Take 1,000 Units by mouth daily.  [name] zinc gluconate 50 MG tablet Take 50 mg by mouth daily.  . [DISCONTINUED]  fenofibrate (TRICOR) 145 MG tablet Take 1 tablet (145 mg total) by mouth daily.  . [DISCONTINUED] rosuvastatin (CRESTOR) 10 MG tablet Take 1 tablet (10 mg total) by mouth daily.  . [DISCONTINUED] VASCEPA 1 g capsule TAKE 2 CAPSULES BY MOUTH TWICE DAILY     Allergies:   Lisinopril and Other   Social History   Socioeconomic History  . Marital status: Married    Spouse name: Not on file  . Number of children: 2  . Years of education: Not on file  . Highest education level: Not on file  Occupational History  . 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   Tobacco Use  . Smoking status: Never Smoker  . Smokeless tobacco: Never Used  Vaping Use  . Vaping Use: Never used  Substance and Sexual Activity  . Alcohol use: No  . Drug use: No  . Sexual activity: Not Currently  Other Topics Concern  . Not on file  Social History Narrative   Married, husband is a Dealer   2 young adult daughters   - spanish interpreter   + children   No caffeine   Social Determinants of Radio broadcast assistant Strain: Not on file  Food Insecurity: Not on file  Transportation Needs: Not on file  Physical Activity: Not on file  Stress: Not on file  Social Connections: Not on file     Family History: The patient's family history includes Diabetes in her brother, father, and mother; Heart disease in her paternal uncle; Liver disease in her brother. There is no history of Colon cancer, Esophageal cancer, Rectal cancer, or Stomach cancer.  ROS:   Please see the history of present illness.    All other systems reviewed and are negative.  EKGs/Labs/Other Studies Reviewed:    The following studies were reviewed today: I discussed my findings including lab work with the patient at [facility]: 04/12/2020: Hemoglobin 13.0; Platelets 319 04/26/2020: TSH 1.160 08/30/2020: ALT 17; BUN 13; Creatinine, Ser 0.66;  Potassium 4.4; Sodium 144  Recent Lipid Panel    Component Value Date/Time   CHOL 151 08/30/2020 0920   TRIG 102 08/30/2020 0920   HDL 58 08/30/2020 0920   CHOLHDL 2.6 08/30/2020 0920   CHOLHDL 5 05/15/2018 0938   VLDL 71.2 (H) 05/15/2018 0938   LDLCALC 74 08/30/2020 0920   LDLDIRECT 132.0 05/15/2018 0938    Physical Exam:    VS:  BP 122/68  Pulse (!) 102   Ht 5' 2"  (1.575 m)   Wt 142 lb (64.4 kg)   LMP 11/29/2011   SpO2 96%   BMI 25.97 kg/m     Wt Readings from Last 3 Encounters:  08/31/20 142 lb (64.4 kg)  08/30/20 141 lb (64 kg)  06/21/20 139 lb 1.3 oz (63.1 kg)     GEN: Patient is in no acute distress HEENT: Normal NECK: No JVD; No carotid bruits LYMPHATICS: No lymphadenopathy CARDIAC: Hear sounds regular, 2/6 systolic murmur at the apex. RESPIRATORY:  Clear to auscultation without rales, wheezing or rhonchi  ABDOMEN: Soft, non-tender, non-distended MUSCULOSKELETAL:  No edema; No deformity  SKIN: Warm and dry NEUROLOGIC:  Alert and oriented x 3 PSYCHIATRIC:  Normal affect   Signed, Jenean Lindau, MD  08/31/2020 11:22 AM    [facility]

## 2020-08-31 NOTE — Patient Instructions (Signed)

## 2020-09-03 ENCOUNTER — Other Ambulatory Visit: Payer: Self-pay | Admitting: Nurse Practitioner

## 2020-09-03 DIAGNOSIS — E1169 Type 2 diabetes mellitus with other specified complication: Secondary | ICD-10-CM

## 2020-09-03 MED ORDER — METFORMIN HCL 500 MG PO TABS
500.0000 mg | ORAL_TABLET | Freq: Every day | ORAL | 11 refills | Status: DC
Start: 2020-09-03 — End: 2020-09-03

## 2020-09-03 MED FILL — METFORMIN HCL 500 MG TABS: 500 | 30 days supply | Qty: 30 | Fill #0

## 2020-09-03 NOTE — Progress Notes (Signed)
Okay she is to take 500 mg every morning.  Rx sent to pharmacy

## 2020-09-06 ENCOUNTER — Encounter: Payer: Self-pay | Admitting: Nurse Practitioner

## 2020-10-08 MED FILL — METFORMIN HCL 500 MG TABS: 500 | 30 days supply | Qty: 30 | Fill #1

## 2020-11-03 ENCOUNTER — Telehealth: Payer: Self-pay

## 2020-11-03 ENCOUNTER — Other Ambulatory Visit (HOSPITAL_COMMUNITY): Payer: Self-pay

## 2020-11-03 ENCOUNTER — Telehealth (INDEPENDENT_AMBULATORY_CARE_PROVIDER_SITE_OTHER): Payer: No Typology Code available for payment source | Admitting: Nurse Practitioner

## 2020-11-03 VITALS — BP 126/90 | HR 108 | Temp 93.9°F | Ht 62.0 in | Wt 144.0 lb

## 2020-11-03 DIAGNOSIS — R059 Cough, unspecified: Secondary | ICD-10-CM

## 2020-11-03 MED ORDER — BENZONATATE 100 MG PO CAPS
100.0000 mg | ORAL_CAPSULE | Freq: Three times a day (TID) | ORAL | 1 refills | Status: DC | PRN
  Filled 2020-11-03: qty 30, 10d supply, fill #0

## 2020-11-03 MED ORDER — AZITHROMYCIN 250 MG PO TABS
ORAL_TABLET | ORAL | 0 refills | Status: AC
  Filled 2020-11-03: qty 6, 5d supply, fill #0

## 2020-11-03 NOTE — Progress Notes (Signed)
Virtual Visit via video call    This visit type was conducted due to national recommendations for restrictions regarding the COVID-19 Pandemic (e.g. social distancing) in an effort to limit this patient's exposure and mitigate transmission in our community.  Due to her co-morbid illnesses, this patient is at least at moderate risk for complications without adequate follow up.  This format is felt to be most appropriate for this patient at this time.  All issues noted in this document were discussed and addressed.  A limited physical exam was performed with this format.    This visit type was conducted due to national recommendations for restrictions regarding the COVID-19 Pandemic (e.g. social distancing) in an effort to limit this patient's exposure and mitigate transmission in our community.  Patients identity confirmed using two different identifiers.  This format is felt to be most appropriate for this patient at this time.  All issues noted in this document were discussed and addressed.  No physical exam was performed (except for noted visual exam findings with Video Visits).    Date:  11/03/2020   ID:  Nichole Wilson, DOB February 10, 1961, MRN [id number]  Patient Location:  Home   Provider location:   Office    Chief Complaint:  Cough   History of Present Illness:    Nichole Wilson is a 60 y.o. female who presents via video conferencing for a telehealth visit today.   The patient presents with cough, no fever.   In the past she has had the same symptoms with some cough. Started yesterday. With some headache. No fever. No shortness of breath or chest pain.  She has been drinking a lot of tea and taking Claritin which has helped. At night time she takes robitussin, which has helped her with her cough. Dry cough. She complains this morning her back was hurting.  She has not had any fever.  She had pneumonia last year in the summer due to COVID-19. Therefore, she is just trying to make sure  she doesn't have anything. She does get seasonal allergies. She has not taken a COVID test yet. But I have advised her to go take one.     Past Medical History:  Diagnosis Date  . Abnormal LFTs 10/31/2018  . ANA positive 08/06/2017  . Anal fissure - posterior 11/26/2015  . Anemia   . Anterior scleritis of both eyes 05/12/2015   Followed by Dr Teola Bradley - Last OV 11/10, 11/18 - Update 11/18 - dx Acute toxic conjunctivitis > improved s/p prednisolone - Primary iridocyclitis (R-eye) - begin taper of prednisolone eye drops, much improved - Requested labs all resulted 11/16: ESR (21), ANA (neg), RPR (non-reactive, no FTA-Ab) >> faxed results back to Dr Sharen Counter   . Blood transfusion without reported diagnosis   . Breast cancer ([place]) 2017   Right Breast Cancer  . Breast cancer of upper-outer quadrant of right female breast ([place]) 08/26/2015  . Breast cyst, left 01-04-12   cyst no longer apparent  . Cholelithiasis with choledocholithiasis   . Chronic calculus cholecystitis 12/25/2011   Lap chole on 03/25/12   . Complication of anesthesia 2002   PT STATES DURING HER C-SECTION SHE WAS AWARE OF INCISION AND PAIN -THEN WAS QUICKLY "PUT OUT"  . Controlled type 2 diabetes mellitus with diabetic nephropathy, without long-term current use of insulin ([place]) 05/12/2015  . COVID-19 long hauler manifesting chronic concentration deficit 06/01/2020  . Elevated alkaline phosphatase level 08/07/2018   Normal Alk Phos Isoenzyme  .  Essential hypertension 01/04/2016  . Family history of adverse reaction to anesthesia    father became aggressive  . Fatigue 07/22/2015  . Gallstone pancreatitis   . Gastric mass GIST vs leiomyoma 12/01/2015  . Glucose intolerance (impaired glucose tolerance)    since at least 2011.   [name] Health care maintenance 03/24/2013  . Hepatic steatosis   . History of COVID-19 06/01/2020  . Hx of adenomatous polyp of colon 12/12/2018  . Hypercholesterolemia with hypertriglyceridemia   . Hypertension  01-04-12   med d/c-last taken 12-07-11  -PT [place] STOPPED HER B/P MEDICATION BECAUSE HER B/P TOO LOW AND PT STATES HER B/P'S HAVE BEEN OK SINCE.  [name] Hypertriglyceridemia   . Iron deficiency anemia 10/25/2018  . Migraine without aura, without mention of intractable migraine without mention of status migrainosus   . Overweight (BMI 25.0-29.9) 05/12/2015  . Plantar fasciitis 09/05/2013  . Pneumonia due to COVID-19 virus 02/09/2020  . PONV (postoperative nausea and vomiting)    patient states that she did not get sick with mastectomy  . Pre-diabetes   . Sciatica 05/17/2012   Patient with left leg radicular lumbar pain.  No concerning symptoms aside from pain. Plan: Trial of steroid dosepak and tramadol as needed for pain Discussed warning signs or symptoms. Please see discharge instructions. Patient expresses understanding. F/u 2 weeks if not improved.    . Telogen effluvium 06/01/2020  . Type 2 diabetes mellitus with complication, without long-term current use of insulin ([place]) 05/12/2015  . Type 2 diabetes mellitus without complication, without long-term current use of insulin ([place]) 05/12/2015   Past Surgical History:  Procedure Laterality Date  . CESAREAN SECTION     2002  . CHOLECYSTECTOMY  03/25/2012   Procedure: LAPAROSCOPIC CHOLECYSTECTOMY WITH INTRAOPERATIVE CHOLANGIOGRAM;  Surgeon: Haywood Lasso, MD;  Location: [facility];  Service: General;  Laterality: N/A;  Laparoscopic Cholecystectomy with Intraoperative Cholangiogram  . COLONOSCOPY    . ERCP  12/05/2011   Procedure: ENDOSCOPIC RETROGRADE CHOLANGIOPANCREATOGRAPHY (ERCP);  Surgeon: Inda Castle, MD;  Location: [facility];  Service: Endoscopy;  Laterality: N/A;  . ERCP  12/08/2011   Procedure: ENDOSCOPIC RETROGRADE CHOLANGIOPANCREATOGRAPHY (ERCP);  Surgeon: Inda Castle, MD;  Location: [facility];  Service: Gastroenterology;  Laterality: N/A;  . ESOPHAGOGASTRODUODENOSCOPY (EGD) WITH PROPOFOL N/A 09/28/2015   Procedure:  ESOPHAGOGASTRODUODENOSCOPY (EGD) WITH PROPOFOL;  Surgeon: Clarene Essex, MD;  Location: [facility];  Service: Endoscopy;  Laterality: N/A;  H AND P IN DRAWER  . EUS N/A 10/13/2015   Procedure: UPPER ENDOSCOPIC ULTRASOUND (EUS) RADIAL;  Surgeon: Arta Silence, MD;  Location: [facility];  Service: Endoscopy;  Laterality: N/A;  . LAPAROSCOPIC PARTIAL GASTRECTOMY N/A 01/11/2016   Procedure: LAPAROSCOPIC PARTIAL GASTRECTOMY;  Surgeon: [name], MD;  Location: [place];  Service: General;  Laterality: N/A;  . MASTECTOMY Right 09/2015  . MASTECTOMY W/ SENTINEL NODE BIOPSY Right 09/15/2015   Procedure: RIGHT MASTECTOMY WITH SENTINEL LYMPH NODE BIOPSY;  Surgeon: Rolm Bookbinder, MD;  Location: [place];  Service: General;  Laterality: Right;  . UPPER GI ENDOSCOPY N/A 01/11/2016   Procedure: UPPER GI ENDOSCOPY;  Surgeon: [name], MD;  Location: [place];  Service: General;  Laterality: N/A;     Current Meds  Medication Sig  . azithromycin (ZITHROMAX Z-PAK) 250 MG tablet Take 2 tablets (500 mg) on  Day 1,  followed by 1 tablet (250 mg) once daily on Days 2 through 5.  . benzonatate (TESSALON PERLES) 100 MG capsule Take 1 capsule (100  mg total) by mouth 3 (three) times daily as needed for cough.     Allergies:   Lisinopril and Other   Social History   Tobacco Use  . Smoking status: Never Smoker  . Smokeless tobacco: Never Used  Vaping Use  . Vaping Use: Never used  Substance Use Topics  . Alcohol use: No  . Drug use: No     Family Hx: The patient's family history includes Diabetes in her brother, father, and mother; Heart disease in her paternal uncle; Liver disease in her brother. There is no history of Colon cancer, Esophageal cancer, Rectal cancer, or Stomach cancer.  ROS:   Please see the history of present illness.    Review of Systems  Constitutional: Negative for chills, fever and malaise/fatigue.  HENT: Negative for congestion and sore throat.   Respiratory: Positive for cough.  Negative for shortness of breath and wheezing.   Cardiovascular: Negative for chest pain.  Gastrointestinal: Negative for constipation, nausea and vomiting.  Musculoskeletal: Negative for myalgias.  Neurological: Positive for headaches. Negative for weakness.    All other systems reviewed and are negative.   Labs/Other Tests and Data Reviewed:    Recent Labs: 04/12/2020: Hemoglobin 13.0; Platelets 319 04/26/2020: TSH 1.160 08/30/2020: ALT 17; BUN 13; Creatinine, Ser 0.66; Potassium 4.4; Sodium 144   Recent Lipid Panel Lab Results  Component Value Date/Time   CHOL 151 08/30/2020 09:20 AM   TRIG 102 08/30/2020 09:20 AM   HDL 58 08/30/2020 09:20 AM   CHOLHDL 2.6 08/30/2020 09:20 AM   CHOLHDL 5 05/15/2018 09:38 AM   LDLCALC 74 08/30/2020 09:20 AM   LDLDIRECT 132.0 05/15/2018 09:38 AM    Wt Readings from Last 3 Encounters:  11/03/20 144 lb (65.3 kg)  08/31/20 142 lb (64.4 kg)  08/30/20 141 lb (64 kg)     Exam:    Vital Signs:  BP 126/90   Pulse (!) 108   Temp (!) 93.9 F (34.4 C) (Temporal)   Ht 5' 2" (1.575 m)   Wt 144 lb (65.3 kg)   LMP 11/29/2011   BMI 26.34 kg/m     Physical Exam Vitals and nursing note reviewed.  HENT:     Head: Normocephalic and atraumatic.  Pulmonary:     Effort: Pulmonary effort is normal.  Neurological:     Mental Status: She is alert and oriented to person, place, and time.  Psychiatric:        Mood and Affect: Affect normal.     ASSESSMENT & PLAN:     1. Cough -Advised patient to get tested for COVID; she will let us know of the result -Sent in prescription for z-pak due to her history of COVID pneumonia and comorbidities  -Sent prescription for tessalon pearl for cough  -Advised patient to take Vitamin C, D, Zinc.  Keep yourself hydrated with a lot of water and rest. Take Delsym for cough and Mucinex. Take Tylenol or pain reliever every 4-6 hours as needed for pain/fever/body ache. Educated patient if symptoms get worse or if she  experiences any SOB or pain in her legs to seek immediate emergency care. Continue to monitor your pulse oxygen. Call us if you have any questions. Wear a mask around other people.   Follow up: Patient will call if her symptoms do not improve or get worse. Patient will call with COVID test results.    COVID-19 Education: The signs and symptoms of COVID-19 were discussed with the patient and how to seek  care for testing (follow up with PCP or arrange E-visit).  The importance of social distancing was discussed today.  Patient Risk:   After full review of this patients clinical status, I feel that they are at least moderate risk at this time.  Time:   Today, I have spent 15 minutes/ seconds with the patient with telehealth technology discussing above diagnoses.     Medication Adjustments/Labs and Tests Ordered: Current medicines are reviewed at length with the patient today.  Concerns regarding medicines are outlined above.   Tests Ordered: No orders of the defined types were placed in this encounter.   Medication Changes: Meds ordered this encounter  Medications  . azithromycin (ZITHROMAX Z-PAK) 250 MG tablet    Sig: Take 2 tablets (500 mg) on  Day 1,  followed by 1 tablet (250 mg) once daily on Days 2 through 5.    Dispense:  6 each    Refill:  0  . benzonatate (TESSALON PERLES) 100 MG capsule    Sig: Take 1 capsule (100 mg total) by mouth 3 (three) times daily as needed for cough.    Dispense:  30 capsule    Refill:  1    Disposition:  Follow up if symptoms gets worse or does not get better.   Signed, Bary Castilla, NP

## 2020-11-03 NOTE — Telephone Encounter (Signed)
[facility] consent

## 2020-11-03 NOTE — Patient Instructions (Signed)

## 2020-11-11 ENCOUNTER — Other Ambulatory Visit (HOSPITAL_COMMUNITY): Payer: Self-pay

## 2020-11-11 MED FILL — Metformin HCl Tab 500 MG: ORAL | 30 days supply | Qty: 30 | Fill #0 | Status: AC

## 2020-11-29 ENCOUNTER — Ambulatory Visit (INDEPENDENT_AMBULATORY_CARE_PROVIDER_SITE_OTHER): Payer: No Typology Code available for payment source | Admitting: Nurse Practitioner

## 2020-11-29 ENCOUNTER — Other Ambulatory Visit: Payer: Self-pay

## 2020-11-29 ENCOUNTER — Encounter: Payer: Self-pay | Admitting: Nurse Practitioner

## 2020-11-29 VITALS — BP 118/80 | HR 95 | Temp 98.1°F | Ht 62.0 in | Wt 138.0 lb

## 2020-11-29 DIAGNOSIS — E1169 Type 2 diabetes mellitus with other specified complication: Secondary | ICD-10-CM

## 2020-11-29 DIAGNOSIS — E781 Pure hyperglyceridemia: Secondary | ICD-10-CM | POA: Diagnosis not present

## 2020-11-29 DIAGNOSIS — I10 Essential (primary) hypertension: Secondary | ICD-10-CM

## 2020-11-29 NOTE — Progress Notes (Signed)
I,Yamilka Roman [employer] as a Education administrator for [employer], FNP.,have documented all relevant documentation on the behalf of [name], FNP,as directed by  [name], FNP while in the presence of [name], [place]. This visit occurred during the SARS-CoV-2 public health emergency.  Safety protocols were in place, including screening questions prior to the visit, additional usage of staff PPE, and extensive cleaning of exam room while observing appropriate contact time as indicated for disinfecting solutions.  Subjective:     Patient ID: Nichole Wilson , female    DOB: January 14, 1961 , 60 y.o.   MRN: [id number]   Chief Complaint  Patient presents with  . Hypertension  . Hyperlipidemia    HPI  Patient here for a blood pressure f/u.    She is taking a natural supplement for her cholesterol and she has cut back on her cheese intake. She is to schedule an appt with Cardiology for August.  Hypertension This is a chronic problem. The current episode started more than 1 year ago. The problem is unchanged. The problem is controlled. Pertinent negatives include no chest pain or palpitations. There are no known risk factors for coronary artery disease. Past treatments include nothing. There are no compliance problems.  There is no history of angina. There is no history of chronic renal disease.  Diabetes She presents for her follow-up diabetic visit. She has type 2 diabetes mellitus. Her disease course has been stable. There are no diabetic associated symptoms. Pertinent negatives for diabetes include no chest pain.     Past Medical History:  Diagnosis Date  . Abnormal LFTs 10/31/2018  . ANA positive 08/06/2017  . Anal fissure - posterior 11/26/2015  . Anemia   . Anterior scleritis of both eyes 05/12/2015   Followed by Dr Teola Bradley - Last OV 11/10, 11/18 - Update 11/18 - dx Acute toxic conjunctivitis > improved s/p prednisolone - Primary iridocyclitis (R-eye) - begin taper of prednisolone eye  drops, much improved - Requested labs all resulted 11/16: ESR (21), ANA (neg), RPR (non-reactive, no FTA-Ab) >> faxed results back to Dr Sharen Counter   . Blood transfusion without reported diagnosis   . Breast cancer ([place]) 2017   Right Breast Cancer  . Breast cancer of upper-outer quadrant of right female breast ([place]) 08/26/2015  . Breast cyst, left 01-04-12   cyst no longer apparent  . Cholelithiasis with choledocholithiasis   . Chronic calculus cholecystitis 12/25/2011   Lap chole on 03/25/12   . Complication of anesthesia 2002   PT STATES DURING HER C-SECTION SHE WAS AWARE OF INCISION AND PAIN -THEN WAS QUICKLY "PUT OUT"  . Controlled type 2 diabetes mellitus with diabetic nephropathy, without long-term current use of insulin ([place]) 05/12/2015  . COVID-19 long hauler manifesting chronic concentration deficit 06/01/2020  . Elevated alkaline phosphatase level 08/07/2018   Normal Alk Phos Isoenzyme  . Essential hypertension 01/04/2016  . Family history of adverse reaction to anesthesia    father became aggressive  . Fatigue 07/22/2015  . Gallstone pancreatitis   . Gastric mass GIST vs leiomyoma 12/01/2015  . Glucose intolerance (impaired glucose tolerance)    since at least 2011.   [name] Health care maintenance 03/24/2013  . Hepatic steatosis   . History of COVID-19 06/01/2020  . Hx of adenomatous polyp of colon 12/12/2018  . Hypercholesterolemia with hypertriglyceridemia   . Hypertension 01-04-12   med d/c-last taken 12-07-11  -PT STATES HER MEDICAL DOCTOR STOPPED HER B/P MEDICATION BECAUSE HER B/P TOO LOW AND PT  STATES HER B/P'S HAVE BEEN OK SINCE.  [name] Hypertriglyceridemia   . Iron deficiency anemia 10/25/2018  . Migraine without aura, without mention of intractable migraine without mention of status migrainosus   . Overweight (BMI 25.0-29.9) 05/12/2015  . Plantar fasciitis 09/05/2013  . Pneumonia due to COVID-19 virus 02/09/2020  . PONV (postoperative nausea and vomiting)    patient states that she did not  get sick with mastectomy  . Pre-diabetes   . Sciatica 05/17/2012   Patient with left leg radicular lumbar pain.  No concerning symptoms aside from pain. Plan: Trial of steroid dosepak and tramadol as needed for pain Discussed warning signs or symptoms. Please see discharge instructions. Patient expresses understanding. F/u 2 weeks if not improved.    . Telogen effluvium 06/01/2020  . Type 2 diabetes mellitus with complication, without long-term current use of insulin ([place]) 05/12/2015  . Type 2 diabetes mellitus without complication, without long-term current use of insulin ([place]) 05/12/2015     Family History  Problem Relation Age of Onset  . Diabetes Mother   . Diabetes Father   . Diabetes Brother   . Heart disease Paternal Uncle   . Liver disease Brother   . Colon cancer Neg Hx   . Esophageal cancer Neg Hx   . Rectal cancer Neg Hx   . Stomach cancer Neg Hx      Current Outpatient Medications:  .  Barberry-Oreg Grape-Goldenseal (BERBERINE COMPLEX PO), Take 1 capsule by mouth 3 (three) times daily., Disp: , Rfl:  .  benzonatate (TESSALON PERLES) 100 MG capsule, Take 1 capsule (100 mg total) by mouth 3 (three) times daily as needed for cough., Disp: 30 capsule, Rfl: 1 .  Biotin 10000 MCG TABS, Take 10,000 mcg by mouth 2 (two) times daily., Disp: , Rfl:  .  Cholecalciferol (VITAMIN D3) 10000 units capsule, Take 10,000 Units by mouth daily., Disp: , Rfl:  .  cycloSPORINE (RESTASIS) 0.05 % ophthalmic emulsion, INSTILL 1 DROP INTO AFFECTED EYE EVERY 12 HOURS, Disp: 180 mL, Rfl: 2 .  fenofibrate (TRICOR) 145 MG tablet, TAKE 1 TABLET BY MOUTH DAILY., Disp: 90 tablet, Rfl: 3 .  icosapent Ethyl (VASCEPA) 1 g capsule, TAKE 2 CAPSULES BY MOUTH 2 TIMES DAILY., Disp: 360 capsule, Rfl: 3 .  metFORMIN (GLUCOPHAGE) 500 MG tablet, TAKE 1 TABLET BY MOUTH EVERY MORNING WITH BREAKFAST, Disp: 30 tablet, Rfl: 11 .  pantoprazole (PROTONIX) 40 MG tablet, TAKE 1 TABLET BY MOUTH ONCE A DAY BEFORE BREAKFAST,  Disp: 90 tablet, Rfl: 3 .  RESTASIS 0.05 % ophthalmic emulsion, Place 1 drop into both eyes 2 (two) times daily. , Disp: , Rfl: 2 .  rosuvastatin (CRESTOR) 10 MG tablet, TAKE 1 TABLET BY MOUTH DAILY., Disp: 90 tablet, Rfl: 3 .  vitamin E 1000 UNIT capsule, Take 1,000 Units by mouth daily., Disp: , Rfl:  .  zinc gluconate 50 MG tablet, Take 50 mg by mouth daily., Disp: , Rfl:    Allergies  Allergen Reactions  . Lisinopril Swelling  . Other     General anesthesia-severe vomiting     Review of Systems  Constitutional: Negative.   HENT: Negative.   Cardiovascular: Negative.  Negative for chest pain, palpitations and leg swelling.  Musculoskeletal: Negative.   Skin: Negative.   Psychiatric/Behavioral: Negative.      Today's Vitals   11/29/20 0832  BP: 118/80  Pulse: 95  Temp: 98.1 F (36.7 C)  TempSrc: Oral  Weight: 138 lb (62.6 kg)  Height: 5' 2"  (  1.575 m)  PainSc: 0-No pain   Body mass index is 25.24 kg/m.   Objective:  Physical Exam Constitutional:      General: She is not in acute distress.    Appearance: Normal appearance. She is normal weight.  Cardiovascular:     Rate and Rhythm: Normal rate and regular rhythm.     Pulses: Normal pulses.     Heart sounds: Normal heart sounds. No murmur heard.   Pulmonary:     Effort: Pulmonary effort is normal. No respiratory distress.     Breath sounds: Normal breath sounds. No wheezing.  Musculoskeletal:     Cervical back: Normal range of motion and neck supple.  Skin:    General: Skin is warm and dry.     Capillary Refill: Capillary refill takes less than 2 seconds.  Neurological:     General: No focal deficit present.     Mental Status: She is alert and oriented to person, place, and time.  Psychiatric:        Mood and Affect: Mood normal.        Behavior: Behavior normal.        Thought Content: Thought content normal.        Judgment: Judgment normal.         Assessment And Plan:     1. Essential  hypertension . B/P is well controlled. She continues to be followed by Cardiology . CMP ordered to check renal function.  . The importance of regular exercise and dietary modification was stressed to the patient.  - CMP14+EGFR  2. Hypertriglyceridemia  Chronic, her last lipid check was normal.  Continue with current regimen - Lipid panel - CMP14+EGFR  3. Controlled type 2 diabetes mellitus with other specified complication, without long-term current use of insulin (HCC)  Chronic, Hgba1c was up to 6.6 at last visit she did not want to start metformin  Will recheck today - Hemoglobin A1c   We will check [facility] for her Covid booster date.   Patient was given opportunity to ask questions. Patient verbalized understanding of the plan and was able to repeat key elements of the plan. All questions were answered to their satisfaction.  [name], FNP   I, [name], FNP, have reviewed all documentation for this visit. The documentation on 11/29/20 for the exam, diagnosis, procedures, and orders are all accurate and complete.   IF YOU HAVE BEEN REFERRED TO A SPECIALIST, IT MAY TAKE 1-2 WEEKS TO SCHEDULE/PROCESS THE REFERRAL. IF YOU HAVE NOT HEARD FROM US/SPECIALIST IN TWO WEEKS, PLEASE GIVE [place] A CALL AT [phone] X [phone].   THE PATIENT IS ENCOURAGED TO PRACTICE SOCIAL DISTANCING DUE TO THE COVID-19 PANDEMIC.

## 2020-11-29 NOTE — Patient Instructions (Signed)

## 2020-11-30 LAB — CMP14+EGFR
ALT: 18 IU/L (ref 0–32)
AST: 22 IU/L (ref 0–40)
Albumin/Globulin Ratio: 2 (ref 1.2–2.2)
Albumin: 4.7 g/dL (ref 3.8–4.9)
Alkaline Phosphatase: 133 IU/L — ABNORMAL HIGH (ref 44–121)
BUN/Creatinine Ratio: 19 (ref 9–23)
BUN: 14 mg/dL (ref 6–24)
Bilirubin Total: 0.3 mg/dL (ref 0.0–1.2)
CO2: 21 mmol/L (ref 20–29)
Calcium: 9.4 mg/dL (ref 8.7–10.2)
Chloride: 103 mmol/L (ref 96–106)
Creatinine, Ser: 0.72 mg/dL (ref 0.57–1.00)
Globulin, Total: 2.3 g/dL (ref 1.5–4.5)
Glucose: 135 mg/dL — ABNORMAL HIGH (ref 65–99)
Potassium: 4.6 mmol/L (ref 3.5–5.2)
Sodium: 141 mmol/L (ref 134–144)
Total Protein: 7 g/dL (ref 6.0–8.5)
eGFR: 97 mL/min/{1.73_m2} (ref >59)

## 2020-11-30 LAB — LIPID PANEL
Chol/HDL Ratio: 2.9 ratio (ref 0.0–4.4)
Cholesterol, Total: 182 mg/dL (ref 100–199)
HDL: 62 mg/dL (ref >39)
LDL Chol Calc (NIH): 105 mg/dL — ABNORMAL HIGH (ref 0–99)
Triglycerides: 84 mg/dL (ref 0–149)
VLDL Cholesterol Cal: 15 mg/dL (ref 5–40)

## 2020-11-30 LAB — HEMOGLOBIN A1C
Est. average glucose Bld gHb Est-mCnc: 143 mg/dL
Hgb A1c MFr Bld: 6.6 % — ABNORMAL HIGH (ref 4.8–5.6)

## 2020-12-02 ENCOUNTER — Other Ambulatory Visit (HOSPITAL_COMMUNITY): Payer: Self-pay

## 2020-12-02 MED FILL — Cyclosporine (Ophth) Emulsion 0.05%: OPHTHALMIC | 30 days supply | Qty: 60 | Fill #0 | Status: AC

## 2020-12-09 ENCOUNTER — Encounter: Payer: Self-pay | Admitting: Hematology and Oncology

## 2020-12-09 ENCOUNTER — Other Ambulatory Visit (HOSPITAL_COMMUNITY): Payer: Self-pay

## 2020-12-09 MED FILL — Icosapent Ethyl Cap 1 GM: ORAL | 90 days supply | Qty: 360 | Fill #0 | Status: AC

## 2020-12-09 MED FILL — Metformin HCl Tab 500 MG: ORAL | 30 days supply | Qty: 30 | Fill #1 | Status: AC

## 2021-01-06 ENCOUNTER — Encounter: Payer: Self-pay | Admitting: Hematology and Oncology

## 2021-01-18 ENCOUNTER — Other Ambulatory Visit (HOSPITAL_COMMUNITY): Payer: Self-pay

## 2021-01-18 MED FILL — Metformin HCl Tab 500 MG: ORAL | 30 days supply | Qty: 30 | Fill #2 | Status: AC

## 2021-01-19 ENCOUNTER — Ambulatory Visit (INDEPENDENT_AMBULATORY_CARE_PROVIDER_SITE_OTHER): Payer: No Typology Code available for payment source | Admitting: Nurse Practitioner

## 2021-01-19 ENCOUNTER — Other Ambulatory Visit: Payer: Self-pay

## 2021-01-19 ENCOUNTER — Encounter: Payer: Self-pay | Admitting: Nurse Practitioner

## 2021-01-19 ENCOUNTER — Other Ambulatory Visit: Payer: Self-pay | Admitting: *Deleted

## 2021-01-19 VITALS — BP 122/62 | HR 88 | Temp 98.1°F | Ht 60.2 in | Wt 138.8 lb

## 2021-01-19 DIAGNOSIS — N6459 Other signs and symptoms in breast: Secondary | ICD-10-CM

## 2021-01-19 DIAGNOSIS — N6453 Retraction of nipple: Secondary | ICD-10-CM

## 2021-01-19 NOTE — Progress Notes (Signed)
I,Nichole Wilson,acting as a Education administrator for [employer], Nichole Wilson.,have documented all relevant documentation on the behalf of [employer], Nichole Wilson,as directed by  Nichole Castilla, Nichole Wilson while in the presence of Nichole Castilla, Nichole Wilson.  This visit occurred during the SARS-CoV-2 public health emergency.  Safety protocols were in place, including screening questions prior to the visit, additional usage of staff PPE, and extensive cleaning of exam room while observing appropriate contact time as indicated for disinfecting solutions.  Subjective:     Patient ID: Nichole Wilson , female    DOB: 08-23-1960 , 60 y.o.   MRN: [id number]   Chief Complaint  Patient presents with   Breast Problem    HPI  Patient is here today for changes with her breast. She noticed a while ago that her nipple had become inverted. She also reports that the middle of her breast has become discolored. She does have a hx of breast cancer. Mastectomy of right breast.    Past Medical History:  Diagnosis Date   Abnormal LFTs 10/31/2018   ANA positive 08/06/2017   Anal fissure - posterior 11/26/2015   Anemia    Anterior scleritis of both eyes 05/12/2015   Followed by Dr Teola Bradley - Last OV 11/10, 11/18 - Update 11/18 - dx Acute toxic conjunctivitis > improved s/p prednisolone - Primary iridocyclitis (R-eye) - begin taper of prednisolone eye drops, much improved - Requested labs all resulted 11/16: ESR (21), ANA (neg), RPR (non-reactive, no FTA-Ab) >> faxed results back to Dr Sharen Counter    Blood transfusion without reported diagnosis    Breast cancer [facility]) 2017   Right Breast Cancer   Breast cancer of upper-outer quadrant of right female breast ([place]) 08/26/2015   Breast cyst, left 01-04-12   cyst no longer apparent   Cholelithiasis with choledocholithiasis    Chronic calculus cholecystitis 12/25/2011   Lap chole on 11/18/98    Complication of anesthesia 2002   PT STATES DURING HER C-SECTION SHE WAS AWARE OF INCISION AND PAIN -THEN  WAS QUICKLY "PUT OUT"   Controlled type 2 diabetes mellitus with diabetic nephropathy, without long-term current use of insulin ([place]) 05/12/2015   COVID-19 long hauler manifesting chronic concentration deficit 06/01/2020   Elevated alkaline phosphatase level 08/07/2018   Normal Alk Phos Isoenzyme   Essential hypertension 01/04/2016   Family history of adverse reaction to anesthesia    father became aggressive   Fatigue 07/22/2015   Gallstone pancreatitis    Gastric mass GIST 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    Health care maintenance 03/24/2013   Hepatic steatosis    History of COVID-19 06/01/2020   Hx of adenomatous polyp of colon 12/12/2018   Hypercholesterolemia with hypertriglyceridemia    Hypertension 01-04-12   med d/c-last taken 12-07-11  -PT [place] STOPPED HER B/P MEDICATION BECAUSE HER B/P TOO LOW AND PT STATES HER B/P'S HAVE BEEN OK SINCE.   Hypertriglyceridemia    Iron deficiency anemia 10/25/2018   Migraine without aura, without mention of intractable migraine without mention of status migrainosus    Overweight (BMI 25.0-29.9) 05/12/2015   Plantar fasciitis 09/05/2013   Pneumonia due to COVID-19 virus 02/09/2020   PONV (postoperative nausea and vomiting)    patient states that she did not get sick with mastectomy   Pre-diabetes    Sciatica 05/17/2012   Patient with left leg radicular lumbar pain.  No concerning symptoms aside from pain. Plan: Trial  of steroid dosepak and tramadol as needed for pain Discussed warning signs or symptoms. Please see discharge instructions. Patient expresses understanding. F/u 2 weeks if not improved.     Telogen effluvium 06/01/2020   Type 2 diabetes mellitus with complication, without long-term current use of insulin ([place]) 05/12/2015   Type 2 diabetes mellitus without complication, without long-term current use of insulin ([place]) 05/12/2015     Family History  Problem  Relation Age of Onset   Diabetes Mother    Diabetes Father    Diabetes Brother    Heart disease Paternal Uncle    Liver disease Brother    Colon cancer Neg Hx    Esophageal cancer Neg Hx    Rectal cancer Neg Hx    Stomach cancer Neg Hx      Current Outpatient Medications:    Barberry-Oreg Grape-Goldenseal (BERBERINE COMPLEX PO), Take 1 capsule by mouth 3 (three) times daily., Disp: , Rfl:    benzonatate (TESSALON PERLES) 100 MG capsule, Take 1 capsule (100 mg total) by mouth 3 (three) times daily as needed for cough., Disp: 30 capsule, Rfl: 1   Biotin 10000 MCG TABS, Take 10,000 mcg by mouth 2 (two) times daily., Disp: , Rfl:    Cholecalciferol (VITAMIN D3) 10000 units capsule, Take 10,000 Units by mouth daily., Disp: , Rfl:    cycloSPORINE (RESTASIS) 0.05 % ophthalmic emulsion, INSTILL 1 DROP INTO AFFECTED EYE EVERY 12 HOURS, Disp: 180 mL, Rfl: 2   fenofibrate (TRICOR) 145 MG tablet, TAKE 1 TABLET BY MOUTH DAILY., Disp: 90 tablet, Rfl: 3   icosapent Ethyl (VASCEPA) 1 g capsule, TAKE 2 CAPSULES BY MOUTH 2 TIMES DAILY., Disp: 360 capsule, Rfl: 3   metFORMIN (GLUCOPHAGE) 500 MG tablet, TAKE 1 TABLET BY MOUTH EVERY MORNING WITH BREAKFAST, Disp: 30 tablet, Rfl: 11   pantoprazole (PROTONIX) 40 MG tablet, TAKE 1 TABLET BY MOUTH ONCE A DAY BEFORE BREAKFAST, Disp: 90 tablet, Rfl: 3   RESTASIS 0.05 % ophthalmic emulsion, Place 1 drop into both eyes 2 (two) times daily. , Disp: , Rfl: 2   rosuvastatin (CRESTOR) 10 MG tablet, TAKE 1 TABLET BY MOUTH DAILY., Disp: 90 tablet, Rfl: 3   vitamin E 1000 UNIT capsule, Take 1,000 Units by mouth daily., Disp: , Rfl:    zinc gluconate 50 MG tablet, Take 50 mg by mouth daily., Disp: , Rfl:    Allergies  Allergen Reactions   Lisinopril Swelling   Other     General anesthesia-severe vomiting     Review of Systems  Constitutional:  Negative for chills and fever.  HENT:  Negative for sinus pressure and sinus pain.   Respiratory:  Negative for cough,  shortness of breath and wheezing.   Cardiovascular:  Negative for chest pain and palpitations.  Skin:  Positive for color change.  Neurological:  Negative for weakness and headaches.    Today's Vitals   01/19/21 1449  BP: 122/62  Pulse: 88  Temp: 98.1 F (36.7 C)  TempSrc: Oral  Weight: 138 lb 12.8 oz (63 kg)  Height: 5' 0.2" (1.529 m)   Body mass index is 26.93 kg/m.   Objective:  Physical Exam Constitutional:      Appearance: Normal appearance.  HENT:     Head: Normocephalic and atraumatic.  Cardiovascular:     Rate and Rhythm: Normal rate and regular rhythm.     Pulses: Normal pulses.     Heart sounds: Normal heart sounds. No murmur heard. Pulmonary:     Effort:  Pulmonary effort is normal. No respiratory distress.     Breath sounds: Normal breath sounds. No wheezing.  Chest:  Breasts:    Right: Absent.     Left: Inverted nipple and skin change present.  Skin:    General: Skin is warm and dry.  Neurological:     Mental Status: She is alert and oriented to person, place, and time.  Psychiatric:        Mood and Affect: Mood normal.        Behavior: Behavior normal.        Assessment And Plan:     1. Inverted nipple - MM Digital Diagnostic Unilat L; Future  -Patient has a history of breast cancer in the right breast with a mastectomy. She noticed that her left breast's nipple is inverted. There is some skin discoloration as well. Will send her for diagnostic mammogram. She has also contacted her oncologist and he has ordered a ultrasound. Will follow up with patient on result of mammogram.   The patient was encouraged to call or send a message through [place] for any questions or concerns.   Follow up: if symptoms persist or do not get better.   Patient was given opportunity to ask questions. Patient verbalized understanding of the plan and was able to repeat key elements of the plan. All questions were answered to their satisfaction.  Nichole Starlett Pehrson, DNP   I,  Nichole Wilson have reviewed all documentation for this visit. The documentation on 01/19/21 for the exam, diagnosis, procedures, and orders are all accurate and complete.    IF YOU HAVE BEEN REFERRED TO A SPECIALIST, IT MAY TAKE 1-2 WEEKS TO SCHEDULE/PROCESS THE REFERRAL. IF YOU HAVE NOT HEARD FROM US/SPECIALIST IN TWO WEEKS, PLEASE GIVE [place] A CALL AT [phone].   THE PATIENT IS ENCOURAGED TO PRACTICE SOCIAL DISTANCING DUE TO THE COVID-19 PANDEMIC.

## 2021-01-19 NOTE — Patient Instructions (Signed)
Breast Self-Awareness Breast self-awareness is knowing how your breasts look and feel. Doing breast self-awareness is important. It allows you to catch a breast problem early while it is still small and can be treated. All women should do breast self-awareness, including women who have had breast implants. Tell your doctorif you notice a change in your breasts. What you need: A mirror. A well-lit room. How to do a breast self-exam A breast self-exam is one way to learn what is normal for your breasts and tocheck for changes. To do a breast self-exam: Look for changes  Take off all the clothes above your waist. Stand in front of a mirror in a room with good lighting. Put your hands on your hips. Push your hands down. Look at your breasts and nipples in the mirror to see if one breast or nipple looks different from the other. Check to see if: The shape of one breast is different. The size of one breast is different. There are wrinkles, dips, and bumps in one breast and not the other. Look at each breast for changes in the skin, such as: Redness. Scaly areas. Look for changes in your nipples, such as: Liquid around the nipples. Bleeding. Dimpling. Redness. A change in where the nipples are.  Feel for changes  Lie on your back on the floor. Feel each breast. To do this, follow these steps: Pick a breast to feel. Put the arm closest to that breast above your head. Use your other arm to feel the nipple area of your breast. Feel the area with the pads of your three middle fingers by making small circles with your fingers. For the first circle, press lightly. For the second circle, press harder. For the third circle, press even harder. Keep making circles with your fingers at the different pressures as you move down your breast. Stop when you feel your ribs. Move your fingers a little toward the center of your body. Start making circles with your fingers again, this time going up until  you reach your collarbone. Keep making up-and-down circles until you reach your armpit. Remember to keep using the three pressures. Feel the other breast in the same way. Sit or stand in the tub or shower. With soapy water on your skin, feel each breast the same way you did in step 2 when you were lying on the floor.  Write down what you find Writing down what you find can help you remember what to tell your doctor. Write down: What is normal for each breast. Any changes you find in each breast, including: The kind of changes you find. Whether you have pain. Size and location of any lumps. When you last had your menstrual period. General tips Check your breasts every month. If you are breastfeeding, the best time to check your breasts is after you feed your baby or after you use a breast pump. If you get menstrual periods, the best time to check your breasts is 5-7 days after your menstrual period is over. With time, you will become comfortable with the self-exam, and you will begin to know if there are changes in your breasts. Contact a doctor if you: See a change in the shape or size of your breasts or nipples. See a change in the skin of your breast or nipples, such as red or scaly skin. Have fluid coming from your nipples that is not normal. Find a lump or thick area that was not there before. Have pain in   your breasts. Have any concerns about your breast health. Summary Breast self-awareness includes looking for changes in your breasts, as well as feeling for changes within your breasts. Breast self-awareness should be done in front of a mirror in a well-lit room. You should check your breasts every month. If you get menstrual periods, the best time to check your breasts is 5-7 days after your menstrual period is over. Let your doctor know of any changes you see in your breasts, including changes in size, changes on the skin, pain or tenderness, or fluid from your nipples that is  not normal. This information is not intended to replace advice given to you by your health care provider. Make sure you discuss any questions you have with your healthcare provider. Document Revised: 02/19/2018 Document Reviewed: 02/19/2018 Elsevier Patient Education  [place].

## 2021-01-27 ENCOUNTER — Other Ambulatory Visit: Payer: Self-pay

## 2021-01-27 ENCOUNTER — Ambulatory Visit
Admission: RE | Admit: 2021-01-27 | Discharge: 2021-01-27 | Disposition: A | Discharge status: Home / Self Care | Payer: No Typology Code available for payment source | Source: Ambulatory Visit | Attending: Hematology and Oncology | Admitting: Hematology and Oncology

## 2021-01-27 DIAGNOSIS — N6453 Retraction of nipple: Secondary | ICD-10-CM

## 2021-01-28 ENCOUNTER — Other Ambulatory Visit: Payer: Self-pay | Admitting: *Deleted

## 2021-01-28 DIAGNOSIS — N6453 Retraction of nipple: Secondary | ICD-10-CM

## 2021-02-11 ENCOUNTER — Other Ambulatory Visit: Payer: Self-pay

## 2021-02-11 ENCOUNTER — Ambulatory Visit
Admission: RE | Admit: 2021-02-11 | Discharge: 2021-02-11 | Disposition: A | Discharge status: Home / Self Care | Payer: No Typology Code available for payment source | Source: Ambulatory Visit | Attending: Hematology and Oncology | Admitting: Hematology and Oncology

## 2021-02-11 DIAGNOSIS — N6453 Retraction of nipple: Secondary | ICD-10-CM

## 2021-02-11 MED ORDER — GADOBUTROL 1 MMOL/ML IV SOLN
6.0000 mL | Freq: Once | INTRAVENOUS | Status: AC | PRN
  Administered 2021-02-11: 6 mL via INTRAVENOUS

## 2021-02-14 ENCOUNTER — Other Ambulatory Visit (HOSPITAL_COMMUNITY): Payer: Self-pay

## 2021-02-14 MED FILL — Cyclosporine (Ophth) Emulsion 0.05%: OPHTHALMIC | 30 days supply | Qty: 60 | Fill #1 | Status: AC

## 2021-02-14 MED FILL — Metformin HCl Tab 500 MG: ORAL | 30 days supply | Qty: 30 | Fill #3 | Status: AC

## 2021-02-16 ENCOUNTER — Telehealth: Payer: Self-pay

## 2021-02-16 NOTE — Telephone Encounter (Signed)
Called and left a message asking her to call the office back regarding below message. 

## 2021-02-16 NOTE — Telephone Encounter (Signed)
-----   Message from Gardenia Phlegm, NP sent at 02/11/2021  4:19 PM EDT ----- MRI shows no cancer and recommends patient see surgery.  Please let her know and place referral to whomever she prefers.   ----- Message ----- From: Interface, Rad Results In Sent: 02/11/2021  10:41 AM EDT To: Nicholas Lose, MD

## 2021-02-17 ENCOUNTER — Telehealth: Payer: Self-pay | Admitting: *Deleted

## 2021-02-17 NOTE — Telephone Encounter (Signed)
This RN spoke with pt per results of MRI with negative evaluation for cancer evaluation was for left nipple inversion and discharge ( continuing).  Radiologist per reading : Recommend surgical consultation for further evaluation of patient's  this RN will place request for pt to see Dr [name].

## 2021-02-21 ENCOUNTER — Encounter: Payer: Self-pay | Admitting: Nurse Practitioner

## 2021-02-21 ENCOUNTER — Ambulatory Visit (INDEPENDENT_AMBULATORY_CARE_PROVIDER_SITE_OTHER): Payer: No Typology Code available for payment source | Admitting: Nurse Practitioner

## 2021-02-21 ENCOUNTER — Other Ambulatory Visit: Payer: Self-pay

## 2021-02-21 VITALS — BP 112/86 | HR 85 | Temp 98.4°F | Ht 60.2 in | Wt 138.4 lb

## 2021-02-21 DIAGNOSIS — E663 Overweight: Secondary | ICD-10-CM | POA: Diagnosis not present

## 2021-02-21 DIAGNOSIS — E119 Type 2 diabetes mellitus without complications: Secondary | ICD-10-CM | POA: Diagnosis not present

## 2021-02-21 DIAGNOSIS — N6459 Other signs and symptoms in breast: Secondary | ICD-10-CM | POA: Diagnosis not present

## 2021-02-21 DIAGNOSIS — Z23 Encounter for immunization: Secondary | ICD-10-CM | POA: Diagnosis not present

## 2021-02-21 DIAGNOSIS — I10 Essential (primary) hypertension: Secondary | ICD-10-CM

## 2021-02-21 NOTE — Progress Notes (Signed)
I,Nichole Wilson,acting as a Education administrator for [employer], FNP.,have documented all relevant documentation on the behalf of Nichole Brine, FNP,as directed by  Nichole Brine, FNP while in the presence of Nichole Wilson, Nichole Wilson.  This visit occurred during the SARS-CoV-2 public health emergency.  Safety protocols were in place, including screening questions prior to the visit, additional usage of staff PPE, and extensive cleaning of exam room while observing appropriate contact time as indicated for disinfecting solutions.  Subjective:     Patient ID: Nichole Wilson , female    DOB: 1961-03-31 , 60 y.o.   MRN: [id number]   Chief Complaint  Patient presents with   Hypertension   discuss MRI of Breast    HPI  Patient here for a blood pressure f/u.  The patient states that she is also here to discuss her MRI of her left breast.   Hypertension This is a chronic problem. The current episode started more than 1 year ago. The problem is unchanged. The problem is controlled. Pertinent negatives include no chest pain, headaches or palpitations. There are no known risk factors for coronary artery disease. Past treatments include nothing. There are no compliance problems.  There is no history of angina. There is no history of chronic renal disease.  Diabetes She presents for her follow-up diabetic visit. She has type 2 diabetes mellitus. Her disease course has been stable. Pertinent negatives for hypoglycemia include no dizziness or headaches. There are no diabetic associated symptoms. Pertinent negatives for diabetes include no chest pain.    Past Medical History:  Diagnosis Date   Abnormal LFTs 10/31/2018   ANA positive 08/06/2017   Anal fissure - posterior 11/26/2015   Anemia    Anterior scleritis of both eyes 05/12/2015   Followed by Dr Teola Bradley - Last OV 11/10, 11/18 - Update 11/18 - dx Acute toxic conjunctivitis > improved s/p prednisolone - Primary iridocyclitis (R-eye) - begin taper of prednisolone  eye drops, much improved - Requested labs all resulted 11/16: ESR (21), ANA (neg), RPR (non-reactive, no FTA-Ab) >> faxed results back to Dr Sharen Counter    Blood transfusion without reported diagnosis    Breast cancer [facility]) 2017   Right Breast Cancer   Breast cancer of upper-outer quadrant of right female breast ([place]) 08/26/2015   Breast cyst, left 01-04-12   cyst no longer apparent   Cholelithiasis with choledocholithiasis    Chronic calculus cholecystitis 12/25/2011   Lap chole on 02/15/41    Complication of anesthesia 2002   PT STATES DURING HER C-SECTION SHE WAS AWARE OF INCISION AND PAIN -THEN WAS QUICKLY "PUT OUT"   Controlled type 2 diabetes mellitus with diabetic nephropathy, without long-term current use of insulin ([place]) 05/12/2015   COVID-19 long hauler manifesting chronic concentration deficit 06/01/2020   Elevated alkaline phosphatase level 08/07/2018   Normal Alk Phos Isoenzyme   Essential hypertension 01/04/2016   Family history of adverse reaction to anesthesia    father became aggressive   Fatigue 07/22/2015   Gallstone pancreatitis    Gastric mass GIST 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    Health care maintenance 03/24/2013   Hepatic steatosis    History of COVID-19 06/01/2020   History of COVID-19 06/01/2020   Hx of adenomatous polyp of colon 12/12/2018   Hypercholesterolemia with hypertriglyceridemia    Hypertension 01-04-12   med d/c-last taken 12-07-11  -[place] STOPPED HER B/P MEDICATION BECAUSE HER  B/P TOO LOW AND PT STATES HER B/P'S HAVE BEEN OK SINCE.   Hypertriglyceridemia    Iron deficiency anemia 10/25/2018   Migraine without aura, without mention of intractable migraine without mention of status migrainosus    Overweight (BMI 25.0-29.9) 05/12/2015   Plantar fasciitis 09/05/2013   Pneumonia due to COVID-19 virus 02/09/2020   PONV (postoperative nausea and vomiting)    patient states that she  did not get sick with mastectomy   Pre-diabetes    Sciatica 05/17/2012   Patient with left leg radicular lumbar pain.  No concerning symptoms aside from pain. Plan: Trial of steroid dosepak and tramadol as needed for pain Discussed warning signs or symptoms. Please see discharge instructions. Patient expresses understanding. F/u 2 weeks if not improved.     Telogen effluvium 06/01/2020   Type 2 diabetes mellitus with complication, without long-term current use of insulin ([place]) 05/12/2015   Type 2 diabetes mellitus without complication, without long-term current use of insulin ([place]) 05/12/2015     Family History  Problem Relation Age of Onset   Diabetes Mother    Diabetes Father    Diabetes Brother    Heart disease Paternal Uncle    Liver disease Brother    Colon cancer Neg Hx    Esophageal cancer Neg Hx    Rectal cancer Neg Hx    Stomach cancer Neg Hx      Current Outpatient Medications:    Ascorbic Acid (VITAMIN C PO), Take by mouth., Disp: , Rfl:    Barberry-Oreg Grape-Goldenseal (BERBERINE COMPLEX PO), Take 1 capsule by mouth 3 (three) times daily., Disp: , Rfl:    Cholecalciferol (VITAMIN D3) 10000 units capsule, Take 10,000 Units by mouth daily., Disp: , Rfl:    Cyanocobalamin (VITAMIN B12 PO), Take by mouth., Disp: , Rfl:    cycloSPORINE (RESTASIS) 0.05 % ophthalmic emulsion, INSTILL 1 DROP INTO AFFECTED EYE EVERY 12 HOURS, Disp: 180 mL, Rfl: 2   fenofibrate (TRICOR) 145 MG tablet, TAKE 1 TABLET BY MOUTH DAILY., Disp: 90 tablet, Rfl: 3   metFORMIN (GLUCOPHAGE) 500 MG tablet, TAKE 1 TABLET BY MOUTH EVERY MORNING WITH BREAKFAST, Disp: 30 tablet, Rfl: 11   Multiple Vitamin (MULTIVITAMIN) tablet, Take 1 tablet by mouth daily., Disp: , Rfl:    pyridOXINE (VITAMIN B-6) 100 MG tablet, Take 100 mg by mouth daily., Disp: , Rfl:    rosuvastatin (CRESTOR) 10 MG tablet, TAKE 1 TABLET BY MOUTH DAILY., Disp: 90 tablet, Rfl: 3   vitamin E 1000 UNIT capsule, Take 1,000 Units by mouth daily.,  Disp: , Rfl:    zinc gluconate 50 MG tablet, Take 50 mg by mouth daily., Disp: , Rfl:    icosapent Ethyl (VASCEPA) 1 g capsule, TAKE 2 CAPSULES BY MOUTH 2 TIMES DAILY. (Patient not taking: Reported on 02/21/2021), Disp: 360 capsule, Rfl: 3   RESTASIS 0.05 % ophthalmic emulsion, Place 1 drop into both eyes 2 (two) times daily. , Disp: , Rfl: 2   Allergies  Allergen Reactions   Lisinopril Swelling   Other     General anesthesia-severe vomiting     Review of Systems  Constitutional: Negative.   Respiratory: Negative.    Cardiovascular: Negative.  Negative for chest pain, palpitations and leg swelling.  Gastrointestinal: Negative.   Neurological:  Negative for dizziness and headaches.  Psychiatric/Behavioral: Negative.    All other systems reviewed and are negative.   Today's Vitals   02/21/21 0925  BP: 112/86  Pulse: 85  Temp: 98.4 F (36.9  C)  TempSrc: Oral  Weight: 138 lb 6.4 oz (62.8 kg)  Height: 5' 0.2" (1.529 m)   Body mass index is 26.85 kg/m.  Wt Readings from Last 3 Encounters:  02/21/21 138 lb 6.4 oz (62.8 kg)  01/19/21 138 lb 12.8 oz (63 kg)  11/29/20 138 lb (62.6 kg)    BP Readings from Last 3 Encounters:  02/21/21 112/86  01/19/21 122/62  11/29/20 118/80    Objective:  Physical Exam Vitals reviewed.  Constitutional:      General: She is not in acute distress.    Appearance: Normal appearance. She is normal weight.  Cardiovascular:     Rate and Rhythm: Normal rate and regular rhythm.     Pulses: Normal pulses.     Heart sounds: Normal heart sounds. No murmur heard. Pulmonary:     Effort: Pulmonary effort is normal. No respiratory distress.     Breath sounds: Normal breath sounds. No wheezing.  Musculoskeletal:     Cervical back: Normal range of motion and neck supple.  Skin:    General: Skin is warm and dry.     Capillary Refill: Capillary refill takes less than 2 seconds.  Neurological:     General: No focal deficit present.     Mental Status: She  is alert and oriented to person, place, and time.     Cranial Nerves: No cranial nerve deficit.     Motor: No weakness.  Psychiatric:        Mood and Affect: Mood normal.        Behavior: Behavior normal.        Thought Content: Thought content normal.        Judgment: Judgment normal.        Assessment And Plan:     1. Essential hypertension Comments: Good control Continue current medications - BMP8+EGFR  2. Type 2 diabetes mellitus without complication, without long-term current use of insulin (HCC) Comments: Stable, continue with current medications - Hemoglobin A1c  3. Inverted nipple Comments: She has seen Dr. Lindi Adie mammogram done and she is to follow up with Dr. [name], referral placed by Dr. Geralyn Flash office  4. Immunization due - Varicella-zoster vaccine IM (Shingrix)  5. Overweight (BMI 25.0-29.9) Comments: Continue with regular exercise    Patient was given opportunity to ask questions. Patient verbalized understanding of the plan and was able to repeat key elements of the plan. All questions were answered to their satisfaction.  Nichole Brine, FNP   I, Nichole Brine, FNP, have reviewed all documentation for this visit. The documentation on 02/23/21 for the exam, diagnosis, procedures, and orders are all accurate and complete.   IF YOU HAVE BEEN REFERRED TO A SPECIALIST, IT MAY TAKE 1-2 WEEKS TO SCHEDULE/PROCESS THE REFERRAL. IF YOU HAVE NOT HEARD FROM US/SPECIALIST IN TWO WEEKS, PLEASE GIVE [place] A CALL AT [phone].   THE PATIENT IS ENCOURAGED TO PRACTICE SOCIAL DISTANCING DUE TO THE COVID-19 PANDEMIC.

## 2021-02-21 NOTE — Patient Instructions (Signed)

## 2021-02-22 LAB — BMP8+EGFR
BUN/Creatinine Ratio: 18 (ref 9–23)
BUN: 12 mg/dL (ref 6–24)
CO2: 23 mmol/L (ref 20–29)
Calcium: 9.5 mg/dL (ref 8.7–10.2)
Chloride: 106 mmol/L (ref 96–106)
Creatinine, Ser: 0.66 mg/dL (ref 0.57–1.00)
Glucose: 120 mg/dL — ABNORMAL HIGH (ref 65–99)
Potassium: 4.7 mmol/L (ref 3.5–5.2)
Sodium: 143 mmol/L (ref 134–144)
eGFR: 102 mL/min/{1.73_m2} (ref >59)

## 2021-02-22 LAB — HEMOGLOBIN A1C
Est. average glucose Bld gHb Est-mCnc: 137 mg/dL
Hgb A1c MFr Bld: 6.4 % — ABNORMAL HIGH (ref 4.8–5.6)

## 2021-03-04 ENCOUNTER — Other Ambulatory Visit (HOSPITAL_COMMUNITY): Payer: Self-pay

## 2021-03-04 MED FILL — Icosapent Ethyl Cap 1 GM: ORAL | 90 days supply | Qty: 360 | Fill #1 | Status: AC

## 2021-03-18 ENCOUNTER — Other Ambulatory Visit (HOSPITAL_COMMUNITY): Payer: Self-pay

## 2021-03-18 MED FILL — Metformin HCl Tab 500 MG: ORAL | 30 days supply | Qty: 30 | Fill #4 | Status: AC

## 2021-04-12 ENCOUNTER — Other Ambulatory Visit: Payer: Self-pay

## 2021-04-12 ENCOUNTER — Ambulatory Visit (INDEPENDENT_AMBULATORY_CARE_PROVIDER_SITE_OTHER): Payer: No Typology Code available for payment source

## 2021-04-12 VITALS — BP 118/72 | HR 80 | Temp 98.0°F | Ht 60.2 in | Wt 139.6 lb

## 2021-04-12 DIAGNOSIS — Z23 Encounter for immunization: Secondary | ICD-10-CM

## 2021-04-18 ENCOUNTER — Other Ambulatory Visit (HOSPITAL_COMMUNITY): Payer: Self-pay

## 2021-04-18 MED FILL — Cyclosporine (Ophth) Emulsion 0.05%: OPHTHALMIC | 30 days supply | Qty: 60 | Fill #2 | Status: AC

## 2021-04-18 MED FILL — Metformin HCl Tab 500 MG: ORAL | 30 days supply | Qty: 30 | Fill #5 | Status: AC

## 2021-04-19 ENCOUNTER — Other Ambulatory Visit (HOSPITAL_COMMUNITY): Payer: Self-pay

## 2021-04-28 ENCOUNTER — Other Ambulatory Visit: Payer: Self-pay

## 2021-04-28 ENCOUNTER — Ambulatory Visit (INDEPENDENT_AMBULATORY_CARE_PROVIDER_SITE_OTHER): Payer: No Typology Code available for payment source | Admitting: Nurse Practitioner

## 2021-04-28 ENCOUNTER — Encounter: Payer: Self-pay | Admitting: Nurse Practitioner

## 2021-04-28 VITALS — BP 122/82 | HR 83 | Temp 98.1°F | Ht 60.0 in | Wt 138.6 lb

## 2021-04-28 DIAGNOSIS — E781 Pure hyperglyceridemia: Secondary | ICD-10-CM | POA: Diagnosis not present

## 2021-04-28 DIAGNOSIS — E663 Overweight: Secondary | ICD-10-CM | POA: Diagnosis not present

## 2021-04-28 DIAGNOSIS — Z0000 Encounter for general adult medical examination without abnormal findings: Secondary | ICD-10-CM

## 2021-04-28 DIAGNOSIS — Z853 Personal history of malignant neoplasm of breast: Secondary | ICD-10-CM

## 2021-04-28 DIAGNOSIS — Z9011 Acquired absence of right breast and nipple: Secondary | ICD-10-CM

## 2021-04-28 DIAGNOSIS — I10 Essential (primary) hypertension: Secondary | ICD-10-CM

## 2021-04-28 DIAGNOSIS — E1169 Type 2 diabetes mellitus with other specified complication: Secondary | ICD-10-CM | POA: Diagnosis not present

## 2021-04-28 HISTORY — DX: Personal history of malignant neoplasm of breast: Z85.3

## 2021-04-28 HISTORY — DX: Acquired absence of right breast and nipple: Z90.11

## 2021-04-28 LAB — CMP14+EGFR
ALT: 13 IU/L (ref 0–32)
AST: 10 IU/L (ref 0–40)
Albumin/Globulin Ratio: 2 (ref 1.2–2.2)
Albumin: 4.6 g/dL (ref 3.8–4.9)
Alkaline Phosphatase: 149 IU/L — ABNORMAL HIGH (ref 44–121)
BUN/Creatinine Ratio: 17 (ref 9–23)
BUN: 9 mg/dL (ref 6–24)
Bilirubin Total: 0.5 mg/dL (ref 0.0–1.2)
CO2: 23 mmol/L (ref 20–29)
Calcium: 9.4 mg/dL (ref 8.7–10.2)
Chloride: 103 mmol/L (ref 96–106)
Creatinine, Ser: 0.53 mg/dL — ABNORMAL LOW (ref 0.57–1.00)
Globulin, Total: 2.3 g/dL (ref 1.5–4.5)
Glucose: 114 mg/dL — ABNORMAL HIGH (ref 70–99)
Potassium: 4.3 mmol/L (ref 3.5–5.2)
Sodium: 141 mmol/L (ref 134–144)
Total Protein: 6.9 g/dL (ref 6.0–8.5)
eGFR: 107 mL/min/{1.73_m2} (ref >59)

## 2021-04-28 LAB — POCT URINALYSIS DIPSTICK
Bilirubin, UA: NEGATIVE
Blood, UA: NEGATIVE
Glucose, UA: NEGATIVE
Ketones, UA: NEGATIVE
Nitrite, UA: NEGATIVE
Protein, UA: NEGATIVE
Spec Grav, UA: 1.025 (ref 1.010–1.025)
Urobilinogen, UA: 0.2 E.U./dL
pH, UA: 7 (ref 5.0–8.0)

## 2021-04-28 LAB — POCT UA - MICROALBUMIN
Albumin/Creatinine Ratio, Urine, POC: <30
Creatinine, POC: 200 mg/dL
Microalbumin Ur, POC: 10 mg/L

## 2021-04-28 LAB — LIPID PANEL
Chol/HDL Ratio: 3.9 ratio (ref 0.0–4.4)
Cholesterol, Total: 201 mg/dL — ABNORMAL HIGH (ref 100–199)
HDL: 51 mg/dL (ref >39)
LDL Chol Calc (NIH): 102 mg/dL — ABNORMAL HIGH (ref 0–99)
Triglycerides: 287 mg/dL — ABNORMAL HIGH (ref 0–149)
VLDL Cholesterol Cal: 48 mg/dL — ABNORMAL HIGH (ref 5–40)

## 2021-04-28 LAB — CBC
Hematocrit: 37.7 % (ref 34.0–46.6)
Hemoglobin: 12.9 g/dL (ref 11.1–15.9)
MCH: 31.3 pg (ref 26.6–33.0)
MCHC: 34.2 g/dL (ref 31.5–35.7)
MCV: 92 fL (ref 79–97)
Platelets: 270 10*3/uL (ref 150–450)
RBC: 4.12 x10E6/uL (ref 3.77–5.28)
RDW: 12.5 % (ref 11.7–15.4)
WBC: 5.1 10*3/uL (ref 3.4–10.8)

## 2021-04-28 LAB — HEMOGLOBIN A1C
Est. average glucose Bld gHb Est-mCnc: 123 mg/dL
Hgb A1c MFr Bld: 5.9 % — ABNORMAL HIGH (ref 4.8–5.6)

## 2021-04-28 NOTE — Patient Instructions (Signed)
Health Maintenance, Female Adopting a healthy lifestyle and getting preventive care are important in promoting health and wellness. Ask your health care provider about: The right schedule for you to have regular tests and exams. Things you can do on your own to prevent diseases and keep yourself healthy. What should I know about diet, weight, and exercise? Eat a healthy diet  Eat a diet that includes plenty of vegetables, fruits, low-fat dairy products, and lean protein. Do not eat a lot of foods that are high in solid fats, added sugars, or sodium. Maintain a healthy weight Body mass index (BMI) is used to identify weight problems. It estimates body fat based on height and weight. Your health care provider can help determine your BMI and help you achieve or maintain a healthy weight. Get regular exercise Get regular exercise. This is one of the most important things you can do for your health. Most adults should: Exercise for at least 150 minutes each week. The exercise should increase your heart rate and make you sweat (moderate-intensity exercise). Do strengthening exercises at least twice a week. This is in addition to the moderate-intensity exercise. Spend less time sitting. Even light physical activity can be beneficial. Watch cholesterol and blood lipids Have your blood tested for lipids and cholesterol at 58 years of age, then have this test every 5 years. Have your cholesterol levels checked more often if: Your lipid or cholesterol levels are high. You are older than 58 years of age. You are at high risk for heart disease. What should I know about cancer screening? Depending on your health history and family history, you may need to have cancer screening at various ages. This may include screening for: Breast cancer. Cervical cancer. Colorectal cancer. Skin cancer. Lung cancer. What should I know about heart disease, diabetes, and high blood pressure? Blood pressure and heart  disease High blood pressure causes heart disease and increases the risk of stroke. This is more likely to develop in people who have high blood pressure readings, are of African descent, or are overweight. Have your blood pressure checked: Every 3-5 years if you are 18-39 years of age. Every year if you are 40 years old or older. Diabetes Have regular diabetes screenings. This checks your fasting blood sugar level. Have the screening done: Once every three years after age 40 if you are at a normal weight and have a low risk for diabetes. More often and at a younger age if you are overweight or have a high risk for diabetes. What should I know about preventing infection? Hepatitis B If you have a higher risk for hepatitis B, you should be screened for this virus. Talk with your health care provider to find out if you are at risk for hepatitis B infection. Hepatitis C Testing is recommended for: Everyone born from 1945 through 1965. Anyone with known risk factors for hepatitis C. Sexually transmitted infections (STIs) Get screened for STIs, including gonorrhea and chlamydia, if: You are sexually active and are younger than 58 years of age. You are older than 58 years of age and your health care provider tells you that you are at risk for this type of infection. Your sexual activity has changed since you were last screened, and you are at increased risk for chlamydia or gonorrhea. Ask your health care provider if you are at risk. Ask your health care provider about whether you are at high risk for HIV. Your health care provider may recommend a prescription medicine   to help prevent HIV infection. If you choose to take medicine to prevent HIV, you should first get tested for HIV. You should then be tested every 3 months for as long as you are taking the medicine. Pregnancy If you are about to stop having your period (premenopausal) and you may become pregnant, seek counseling before you get  pregnant. Take 400 to 800 micrograms (mcg) of folic acid every day if you become pregnant. Ask for birth control (contraception) if you want to prevent pregnancy. Osteoporosis and menopause Osteoporosis is a disease in which the bones lose minerals and strength with aging. This can result in bone fractures. If you are 65 years old or older, or if you are at risk for osteoporosis and fractures, ask your health care provider if you should: Be screened for bone loss. Take a calcium or vitamin D supplement to lower your risk of fractures. Be given hormone replacement therapy (HRT) to treat symptoms of menopause. Follow these instructions at home: Lifestyle Do not use any products that contain nicotine or tobacco, such as cigarettes, e-cigarettes, and chewing tobacco. If you need help quitting, ask your health care provider. Do not use street drugs. Do not share needles. Ask your health care provider for help if you need support or information about quitting drugs. Alcohol use Do not drink alcohol if: Your health care provider tells you not to drink. You are pregnant, may be pregnant, or are planning to become pregnant. If you drink alcohol: Limit how much you use to 0-1 drink a day. Limit intake if you are breastfeeding. Be aware of how much alcohol is in your drink. In the [place] one drink equals one 12 oz bottle of beer (355 mL), one 5 oz glass of wine (148 mL), or one 1 oz glass of hard liquor (44 mL). General instructions Schedule regular health, dental, and eye exams. Stay current with your vaccines. Tell your health care provider if: You often feel depressed. You have ever been abused or do not feel safe at home. Summary Adopting a healthy lifestyle and getting preventive care are important in promoting health and wellness. Follow your health care provider's instructions about healthy diet, exercising, and getting tested or screened for diseases. Follow your health care provider's  instructions on monitoring your cholesterol and blood pressure. This information is not intended to replace advice given to you by your health care provider. Make sure you discuss any questions you have with your health care provider. Document Revised: 09/10/2020 Document Reviewed: 06/26/2018 Elsevier Patient Education  2022 Elsevier Inc.  

## 2021-04-28 NOTE — Progress Notes (Signed)
I,Nichole Wilson,acting as a Education administrator for [employer], FNP.,have documented all relevant documentation on the behalf of Nichole Brine, FNP,as directed by  Nichole Brine, FNP while in the presence of Nichole Wilson, [place].   This visit occurred during the SARS-CoV-2 public health emergency.  Safety protocols were in place, including screening questions prior to the visit, additional usage of staff PPE, and extensive cleaning of exam room while observing appropriate contact time as indicated for disinfecting solutions.  Subjective:     Patient ID: Nichole Wilson , female    DOB: August 29, 1960 , 60 y.o.   MRN: [id number]   Chief Complaint  Patient presents with   Annual Exam    HPI  Patient presents today for hm.  Overall is doing well no concerns.    Wt Readings from Last 3 Encounters: 04/28/21 : 138 lb 9.6 oz (62.9 kg) 04/12/21 : 139 lb 9.6 oz (63.3 kg) 02/21/21 : 138 lb 6.4 oz (62.8 kg)      Past Medical History:  Diagnosis Date   Abnormal LFTs 10/31/2018   ANA positive 08/06/2017   Anal fissure - posterior 11/26/2015   Anemia    Anterior scleritis of both eyes 05/12/2015   Followed by Dr Teola Bradley - Last OV 11/10, 11/18 - Update 11/18 - dx Acute toxic conjunctivitis > improved s/p prednisolone - Primary iridocyclitis (R-eye) - begin taper of prednisolone eye drops, much improved - Requested labs all resulted 11/16: ESR (21), ANA (neg), RPR (non-reactive, no FTA-Ab) >> faxed results back to Dr Sharen Counter    Blood transfusion without reported diagnosis    Breast cancer [facility]) 2017   Right Breast Cancer   Breast cancer of upper-outer quadrant of right female breast ([place]) 08/26/2015   Breast cyst, left 01-04-12   cyst no longer apparent   Cholelithiasis with choledocholithiasis    Chronic calculus cholecystitis 12/25/2011   Lap chole on 0/9/62    Complication of anesthesia 2002   PT STATES DURING HER C-SECTION SHE WAS AWARE OF INCISION AND PAIN -THEN WAS QUICKLY "PUT OUT"   Controlled type  2 diabetes mellitus with diabetic nephropathy, without long-term current use of insulin ([place]) 05/12/2015   COVID-19 long hauler manifesting chronic concentration deficit 06/01/2020   Elevated alkaline phosphatase level 08/07/2018   Normal Alk Phos Isoenzyme   Essential hypertension 01/04/2016   Family history of adverse reaction to anesthesia    father became aggressive   Fatigue 07/22/2015   Gallstone pancreatitis    Gastric mass GIST 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    Health care maintenance 03/24/2013   Hepatic steatosis    History of COVID-19 06/01/2020   History of COVID-19 06/01/2020   Hx of adenomatous polyp of colon 12/12/2018   Hypercholesterolemia with hypertriglyceridemia    Hypertension 01-04-12   med d/c-last taken 12-07-11  -PT Prosser STOPPED HER B/P MEDICATION BECAUSE HER B/P TOO LOW AND PT STATES HER B/P'S HAVE BEEN OK SINCE.   Hypertriglyceridemia    Iron deficiency anemia 10/25/2018   Migraine without aura, without mention of intractable migraine without mention of status migrainosus    Overweight (BMI 25.0-29.9) 05/12/2015   Plantar fasciitis 09/05/2013   Pneumonia due to COVID-19 virus 02/09/2020   PONV (postoperative nausea and vomiting)    patient states that she did not get sick with mastectomy   Pre-diabetes    Sciatica 05/17/2012   Patient with left leg radicular  lumbar pain.  No concerning symptoms aside from pain. Plan: Trial of steroid dosepak and tramadol as needed for pain Discussed warning signs or symptoms. Please see discharge instructions. Patient expresses understanding. F/u 2 weeks if not improved.     Telogen effluvium 06/01/2020   Type 2 diabetes mellitus with complication, without long-term current use of insulin ([place]) 05/12/2015   Type 2 diabetes mellitus without complication, without long-term current use of insulin ([place]) 05/12/2015     Family History  Problem Relation  Age of Onset   Diabetes Mother    Diabetes Father    Diabetes Brother    Heart disease Paternal Uncle    Liver disease Brother    Colon cancer Neg Hx    Esophageal cancer Neg Hx    Rectal cancer Neg Hx    Stomach cancer Neg Hx      Current Outpatient Medications:    Ascorbic Acid (VITAMIN C PO), Take by mouth., Disp: , Rfl:    Barberry-Oreg Grape-Goldenseal (BERBERINE COMPLEX PO), Take 1 capsule by mouth 3 (three) times daily., Disp: , Rfl:    Cholecalciferol (VITAMIN D3) 10000 units capsule, Take 10,000 Units by mouth daily., Disp: , Rfl:    Cyanocobalamin (VITAMIN B12 PO), Take by mouth., Disp: , Rfl:    cycloSPORINE (RESTASIS) 0.05 % ophthalmic emulsion, INSTILL 1 DROP INTO AFFECTED EYE EVERY 12 HOURS, Disp: 180 mL, Rfl: 2   fenofibrate (TRICOR) 145 MG tablet, TAKE 1 TABLET BY MOUTH DAILY., Disp: 90 tablet, Rfl: 3   icosapent Ethyl (VASCEPA) 1 g capsule, TAKE 2 CAPSULES BY MOUTH 2 TIMES DAILY., Disp: 360 capsule, Rfl: 3   metFORMIN (GLUCOPHAGE) 500 MG tablet, TAKE 1 TABLET BY MOUTH EVERY MORNING WITH BREAKFAST, Disp: 30 tablet, Rfl: 11   Multiple Vitamin (MULTIVITAMIN) tablet, Take 1 tablet by mouth daily., Disp: , Rfl:    pyridOXINE (VITAMIN B-6) 100 MG tablet, Take 100 mg by mouth daily., Disp: , Rfl:    rosuvastatin (CRESTOR) 10 MG tablet, TAKE 1 TABLET BY MOUTH DAILY., Disp: 90 tablet, Rfl: 3   vitamin E 1000 UNIT capsule, Take 1,000 Units by mouth daily., Disp: , Rfl:    zinc gluconate 50 MG tablet, Take 50 mg by mouth daily., Disp: , Rfl:    Allergies  Allergen Reactions   Lisinopril Swelling   Other     General anesthesia-severe vomiting      The patient states she is post menopausal status.  Patient's last menstrual period was 11/29/2011.. Negative for Dysmenorrhea and Negative for Menorrhagia. Negative for: breast discharge, breast lump(s), breast pain and breast self exam. Associated symptoms include abnormal vaginal bleeding. Pertinent negatives include abnormal  bleeding (hematology), anxiety, decreased libido, depression, difficulty falling sleep, dyspareunia, history of infertility, nocturia, sexual dysfunction, sleep disturbances, urinary incontinence, urinary urgency, vaginal discharge and vaginal itching. Diet regular - she is reducing her carbs, sugar and salt intake. The patient states her exercise level is moderate with 3 times a week for 45 minutes.    The patient's tobacco use is:  Social History   Tobacco Use  Smoking Status Never  Smokeless Tobacco Never   She has been exposed to passive smoke. The patient's alcohol use is:  Social History   Substance and Sexual Activity  Alcohol Use No   Additional information: Last pap 02/27/2019, next one scheduled for 02/26/2022.    Review of Systems  Constitutional: Negative.   HENT: Negative.    Eyes: Negative.   Respiratory: Negative.    Cardiovascular:  Negative.   Gastrointestinal: Negative.   Endocrine: Negative.   Genitourinary: Negative.   Musculoskeletal: Negative.   Skin: Negative.   Allergic/Immunologic: Negative.   Neurological: Negative.   Hematological: Negative.   Psychiatric/Behavioral: Negative.      Today's Vitals   04/28/21 0902  BP: 122/82  Pulse: 83  Temp: 98.1 F (36.7 C)  Weight: 138 lb 9.6 oz (62.9 kg)  Height: 5' (1.524 m)  PainSc: 0-No pain   Body mass index is 27.07 kg/m.   Objective:  Physical Exam Vitals reviewed.  Constitutional:      General: She is not in acute distress.    Appearance: Normal appearance. She is well-developed. She is obese.  HENT:     Head: Normocephalic and atraumatic.     Right Ear: Hearing, tympanic membrane, ear canal and external ear normal. There is no impacted cerumen.     Left Ear: Hearing, tympanic membrane, ear canal and external ear normal. There is no impacted cerumen.     Nose:     Comments: Deferred - masked    Mouth/Throat:     Comments: Deferred - masked Eyes:     General: Lids are normal.      Extraocular Movements: Extraocular movements intact.     Conjunctiva/sclera: Conjunctivae normal.     Pupils: Pupils are equal, round, and reactive to light.     Funduscopic exam:    Right eye: No papilledema.        Left eye: No papilledema.  Neck:     Thyroid: No thyroid mass.     Vascular: No carotid bruit.  Cardiovascular:     Rate and Rhythm: Normal rate and regular rhythm.     Pulses: Normal pulses.     Heart sounds: Normal heart sounds. No murmur heard. Pulmonary:     Effort: Pulmonary effort is normal. No respiratory distress.     Breath sounds: Normal breath sounds. No wheezing.  Chest:     Chest wall: No mass.  Breasts:    Tanner Score is 5.     Breasts are asymmetrical.     Right: Absent.     Left: Normal.  Abdominal:     General: Abdomen is flat. Bowel sounds are normal. There is no distension.     Palpations: Abdomen is soft.     Tenderness: There is no abdominal tenderness.  Genitourinary:    Rectum: Guaiac result negative.  Musculoskeletal:        General: No swelling. Normal range of motion.     Cervical back: Full passive range of motion without pain, normal range of motion and neck supple.     Right lower leg: No edema.     Left lower leg: No edema.  Skin:    General: Skin is warm and dry.     Capillary Refill: Capillary refill takes less than 2 seconds.  Neurological:     General: No focal deficit present.     Mental Status: She is alert and oriented to person, place, and time.     Cranial Nerves: No cranial nerve deficit.     Sensory: No sensory deficit.  Psychiatric:        Mood and Affect: Mood normal.        Behavior: Behavior normal.        Thought Content: Thought content normal.        Judgment: Judgment normal.        Assessment And Plan:     1. Encounter  for general adult medical examination w/o abnormal findings Behavior modifications discussed and diet history reviewed.   Pt will continue to exercise regularly and modify diet with  low GI, plant based foods and decrease intake of processed foods.  Recommend intake of daily multivitamin, Vitamin D, and calcium.  Recommend mammogram and colonoscopy for preventive screenings, as well as recommend immunizations that include influenza, TDAP, and Shingles - CBC  2. Essential hypertension B/P is controlled.  CMP ordered to check renal function.  The importance of regular exercise and dietary modification was stressed to the patient.  EKG done with NSR - EKG 12-Lead - CMP14+EGFR  3. Type 2 diabetes mellitus with other specified complication, without long-term current use of insulin (HCC) Doing well, continue current medications - Hemoglobin A1c  4. Hypertriglyceridemia Will check lipid panel.  Cholesterol levels were mostly normal at last visit - Lipid panel  5. Overweight (BMI 25.0-29.9) Continue healthy diet and regular exercise of at least 150 minutes per week  6. History of right mastectomy  7. History of right breast cancer     Patient was given opportunity to ask questions. Patient verbalized understanding of the plan and was able to repeat key elements of the plan. All questions were answered to their satisfaction.   Nichole Brine, FNP   I, Nichole Brine, FNP, have reviewed all documentation for this visit. The documentation on 04/28/21 for the exam, diagnosis, procedures, and orders are all accurate and complete.  THE PATIENT IS ENCOURAGED TO PRACTICE SOCIAL DISTANCING DUE TO THE COVID-19 PANDEMIC.

## 2021-05-24 ENCOUNTER — Other Ambulatory Visit (HOSPITAL_COMMUNITY): Payer: Self-pay

## 2021-05-24 MED FILL — Metformin HCl Tab 500 MG: ORAL | 30 days supply | Qty: 30 | Fill #6 | Status: AC

## 2021-05-24 NOTE — Progress Notes (Signed)
Patient Care Team: [name], FNP as PCP - General (General Practice)  DIAGNOSIS:    ICD-10-CM   1. Malignant neoplasm of upper-outer quadrant of right breast in female, estrogen receptor positive ([place])  C50.411    Z17.0       SUMMARY OF ONCOLOGIC HISTORY: Oncology History  Breast cancer of upper-outer quadrant of right female breast ([place])  11/15/2012 Imaging   DEXA scan: Normal. T-score -0.7   08/10/2015 Breast MRI   Enhancing mass in right breast upper outer quadrant 8 x 5 x 7 mm, no retroareolar right breast abnormality, no abnormality of the site of nipple retraction   08/18/2015 Initial Biopsy   Right breast biopsy: Invasive ductal carcinoma with DCIS, grade 2 with focal necrosis,ER 100%, PR 0%, Ki-67 5%,HER-2 positive ratio 4.52   08/19/2015 Mammogram   Right breast calcifications now seem to span 4 x 2 x 1.5 cm   09/15/2015 Surgery   Rt mastectomy [name]): IDC Grade 2, 0.18 cm, with DCIS, 0/1 LN, ER 100%, PR 0%, HER2 Positive 4.52, T1aN0M0 (Stage 1A)   10/12/2015 - 11/23/2015 Chemotherapy   Herceptin every 3 weeks 3 cycles (based on [facility] trial: Short herceptin protocol)   11/2015 - 09/02/2018 Anti-estrogen oral therapy   Anastrazole 1 mg daily stopped 09/04/2016 due to severe fatigue resumed May 2018     CHIEF COMPLIANT: Follow-up of left breast cancer  INTERVAL HISTORY: Nichole Wilson is a 60 y.o. with above-mentioned history of right breast cancer who is currently on surveillance. Mammogram on 01/22/20 was unclear if there has been a definitive change. MRI Breast on 02/11/2021 showed stable left nipple inversion and multiple subcentimeter central left breast cysts, and no evidence of malignancy within the left breast. She presents to the clinic today for follow-up.   ALLERGIES:  is allergic to lisinopril and other.  MEDICATIONS:  Current Outpatient Medications  Medication Sig Dispense Refill   Ascorbic Acid (VITAMIN C) 1000 MG tablet Take 1 tablet (1,000 mg total)  by mouth daily.     Cholecalciferol (VITAMIN D3) 10000 units capsule Take 10,000 Units by mouth daily.     Cyanocobalamin (VITAMIN B12 PO) Take by mouth.     cycloSPORINE (RESTASIS) 0.05 % ophthalmic emulsion INSTILL 1 DROP INTO AFFECTED EYE EVERY 12 HOURS 180 mL 2   icosapent Ethyl (VASCEPA) 1 g capsule TAKE 2 CAPSULES BY MOUTH 2 TIMES DAILY. 360 capsule 3   metFORMIN (GLUCOPHAGE) 500 MG tablet TAKE 1 TABLET BY MOUTH EVERY MORNING WITH BREAKFAST 30 tablet 11   Multiple Vitamin (MULTIVITAMIN) tablet Take 1 tablet by mouth daily.     rosuvastatin (CRESTOR) 10 MG tablet TAKE 1 TABLET BY MOUTH DAILY. 90 tablet 3   vitamin E 1000 UNIT capsule Take 1,000 Units by mouth daily.     No current facility-administered medications for this visit.    PHYSICAL EXAMINATION: ECOG PERFORMANCE STATUS: 1 - Symptomatic but completely ambulatory  Vitals:   05/25/21 0924  BP: (!) 161/83  Pulse: 94  Resp: 18  Temp: 97.7 F (36.5 C)  SpO2: 99%   Filed Weights   05/25/21 0924  Weight: 143 lb 1.6 oz (64.9 kg)    BREAST: No palpable masses or nodules in either right or left breasts. No palpable axillary supraclavicular or infraclavicular adenopathy no breast tenderness or nipple discharge. (exam performed in the presence of a chaperone)  LABORATORY DATA:  I have reviewed the data as listed CMP Latest Ref Rng & Units 04/28/2021 02/21/2021 11/29/2020  Glucose  70 - 99 mg/dL 114(H) 120(H) 135(H)  BUN 6 - 24 mg/dL _0 Creatinine 0.57 - 1.00 mg/dL 0.53(L) 0.66 0.72  Sodium 134 - 144 mmol/L 141 143 141  Potassium 3.5 - 5.2 mmol/L 4.3 4.7 4.6  Chloride 96 - 106 mmol/L 103 106 103  CO2 20 - 29 mmol/L _1 Calcium 8.7 - 10.2 mg/dL 9.4 9.5 9.4  Total Protein 6.0 - 8.5 g/dL 6.9 - 7.0  Total Bilirubin 0.0 - 1.2 mg/dL 0.5 - 0.3  Alkaline Phos 44 - 121 IU/L 149(H) - 133(H)  AST 0 - 40 IU/L 10 - 22  ALT 0 - 32 IU/L 13 - 18    Lab Results  Component Value Date   WBC 5.1 04/28/2021   HGB 12.9  04/28/2021   HCT 37.7 04/28/2021   MCV 92 04/28/2021   PLT 270 04/28/2021   NEUTROABS 2.5 12/12/2018    ASSESSMENT & PLAN:  Breast cancer of upper-outer quadrant of right female breast ([place]) Rt mastectomy 09/15/15: IDC Grade 2, 0.18 cm, with DCIS, 0/1 LN, ER 100%, PR 0%, HER2 Positive 4.52, T1aN0M0 (Stage 1A) Final tumor size: 0.35 cm on biopsy +0.18 cm on final lumpectomy equals 0.53 cm   Treatment plan: 1. shorter duration of anti-HER-2 therapy based upon [facility] trial where they used only 9 weeks of Herceptin treatment. Herceptin q 3 weeks 3 cycles.   2. Adjuvant antiestrogen therapy with anastrozole 1 mg daily started 11/15/2015, stopped 09/04/2016 due to severe fatigue Resumed March 2018 stopped February 2020   Breast cancer surveillance:  1. Mammograms 01/27/2021: Obscured masses inferior left breast with nipple inversion no ultrasound: Multiple cysts in the left breast 2. Breast MRI 02/11/2021: No evidence of any cancer 3. CT abdomen and pelvis 04/23/2020: No evidence of metastatic disease , fatty liver 4.  Breast exam: 05/25/2021: Benign   Upper back pain: CT scan April 2020: No evidence of metastatic disease. Small lung nodules: Will need follow-up. Covid pneumonia: July 2021: Admitted at [facility]. She has made very good recovery from that.   Iron deficiency anemia: IV iron given April 2020 She is going to [place] and [place] after [place].  She is very excited about that.  Return to clinic in 1 year for follow-up   No orders of the defined types were placed in this encounter.  The patient has a good understanding of the overall plan. she agrees with it. she will call with any problems that may develop before the next visit here.  Total time spent: 20 mins including face to face time and time spent for planning, charting and coordination of care  Rulon Eisenmenger, MD, MPH 05/25/2021  I, Thana Ates, am acting  as scribe for Dr. Nicholas Lose.  I have reviewed the above documentation for accuracy and completeness, and I agree with the above.

## 2021-05-25 ENCOUNTER — Inpatient Hospital Stay
Payer: No Typology Code available for payment source | Attending: Hematology and Oncology | Admitting: Hematology and Oncology

## 2021-05-25 ENCOUNTER — Other Ambulatory Visit: Payer: Self-pay

## 2021-05-25 DIAGNOSIS — Z8616 Personal history of COVID-19: Secondary | ICD-10-CM | POA: Diagnosis not present

## 2021-05-25 DIAGNOSIS — R918 Other nonspecific abnormal finding of lung field: Secondary | ICD-10-CM | POA: Insufficient documentation

## 2021-05-25 DIAGNOSIS — C50411 Malignant neoplasm of upper-outer quadrant of right female breast: Secondary | ICD-10-CM | POA: Insufficient documentation

## 2021-05-25 DIAGNOSIS — Z170 Estrogen receptor positive status [ER+]: Secondary | ICD-10-CM | POA: Insufficient documentation

## 2021-05-25 MED ORDER — VITAMIN C 1000 MG PO TABS: 1000.0000 mg | ORAL_TABLET | Freq: Every day | ORAL | Status: DC

## 2021-05-25 NOTE — Assessment & Plan Note (Signed)
Rt mastectomy 09/15/15: IDC Grade 2, 0.18 cm, with DCIS, 0/1 LN, ER 100%, PR 0%, HER2 Positive 4.52, T1aN0M0 (Stage 1A) Final tumor size: 0.35 cm on biopsy +0.18 cm on final lumpectomy equals 0.53 cm  Treatment plan: 1. shorter duration of anti-HER-2 therapy based upon [facility] trial where they used only 9 weeks of Herceptin treatment. Herceptin q 3 weeks 3 cycles.  2. Adjuvant antiestrogen therapy with anastrozole 1 mg daily started 11/15/2015, stopped 09/04/2016 due to severe fatigueResumed March 2018stopped February 2020  Breast cancer surveillance: 1.Mammograms7/14/2022: Obscured masses inferior left breast with nipple inversion no ultrasound: Multiple cysts in the left breast 2.Breast MRI7/29/2022: No evidence of any cancer 3.CT abdomen and pelvis 04/23/2020: No evidence of metastatic disease , fatty liver  Upper back pain: CT scan April 2020: No evidence of metastatic disease. Small lung nodules: Will need follow-up. Covid pneumonia: July 2021: Admitted at [facility]. She has made very good recovery from that.  Iron deficiency anemia: IV iron given April 2020  Return to clinic in 1 year for follow-up

## 2021-05-31 ENCOUNTER — Other Ambulatory Visit: Payer: Self-pay

## 2021-06-02 ENCOUNTER — Other Ambulatory Visit: Payer: Self-pay

## 2021-06-02 ENCOUNTER — Encounter: Payer: Self-pay | Admitting: Cardiology

## 2021-06-02 ENCOUNTER — Ambulatory Visit: Payer: No Typology Code available for payment source | Admitting: Cardiology

## 2021-06-02 VITALS — BP 122/80 | HR 110 | Ht 62.0 in | Wt 140.1 lb

## 2021-06-02 DIAGNOSIS — I10 Essential (primary) hypertension: Secondary | ICD-10-CM | POA: Diagnosis not present

## 2021-06-02 DIAGNOSIS — I700 Atherosclerosis of aorta: Secondary | ICD-10-CM

## 2021-06-02 DIAGNOSIS — E782 Mixed hyperlipidemia: Secondary | ICD-10-CM | POA: Diagnosis not present

## 2021-06-02 DIAGNOSIS — E1121 Type 2 diabetes mellitus with diabetic nephropathy: Secondary | ICD-10-CM | POA: Diagnosis not present

## 2021-06-02 HISTORY — DX: Atherosclerosis of aorta: I70.0

## 2021-06-02 NOTE — Progress Notes (Signed)
Cardiology Office Note:    Date:  06/02/2021   ID:  Nichole Wilson, DOB February 04, 1961, MRN [id number]  PCP:  [name], FNP  Cardiologist:  Jenean Lindau, MD   Referring MD: [name], FNP    ASSESSMENT:    1. Essential hypertension   2. Controlled type 2 diabetes mellitus with diabetic nephropathy, without long-term current use of insulin ([place])   3. Hypercholesterolemia with hypertriglyceridemia   4. Aortic atherosclerosis ([place])    PLAN:    In order of problems listed above:  Aortic atherosclerosis: Secondary prevention stressed with the patient.  Importance of compliance with diet medication stressed and she vocalized understanding.  She was advised to walk at least half an hour a day 5 days a week and she promises to do so. Mixed dyslipidemia: Diet was emphasized.  She is not keen on statin therapy as she says she has significant issues.  I respect her wishes.  Diet and exercise stressed.  She is trying to go better with diet.  Her lipids will be checked by primary care doctor in the next few months.  Recent lipids were reviewed.  Goal LDL is less than 70. Diabetes mellitus: Managed by primary care.  Hemoglobin A1c reviewed and again diet and lifestyle modification urged Patient will be seen in follow-up appointment in 6 months or earlier if the patient has any concerns    Medication Adjustments/Labs and Tests Ordered: Current medicines are reviewed at length with the patient today.  Concerns regarding medicines are outlined above.  No orders of the defined types were placed in this encounter.  No orders of the defined types were placed in this encounter.    No chief complaint on file.    History of Present Illness:    Nichole Wilson is a 60 y.o. female.  Patient has past medical history of essential hypertension, dyslipidemia, abnormal LFTs in the past and diabetes mellitus.  She has abdominal aortic atherosclerosis.  She denies any problems at this time and  takes care of activities of daily living.  No chest pain orthopnea or PND.  At the time of my evaluation, the patient is alert awake oriented and in no distress.  She leads a sedentary lifestyle.  She is trying to do better with diet.  Past Medical History:  Diagnosis Date   Abnormal LFTs 10/31/2018   ANA positive 08/06/2017   Anal fissure - posterior 11/26/2015   Anemia    Anterior scleritis of both eyes 05/12/2015   Followed by Dr Teola Bradley - Last OV 11/10, 11/18 - Update 11/18 - dx Acute toxic conjunctivitis > improved s/p prednisolone - Primary iridocyclitis (R-eye) - begin taper of prednisolone eye drops, much improved - Requested labs all resulted 11/16: ESR (21), ANA (neg), RPR (non-reactive, no FTA-Ab) >> faxed results back to Dr Sharen Counter    Blood transfusion without reported diagnosis    Breast cancer of upper-outer quadrant of right female breast ([place]) 08/26/2015   Breast cyst, left 01/04/2012   cyst no longer apparent   Cholelithiasis with choledocholithiasis    Complication of anesthesia 2002   PT STATES DURING HER C-SECTION SHE WAS AWARE OF INCISION AND PAIN -THEN WAS QUICKLY "PUT OUT"   Controlled type 2 diabetes mellitus with diabetic nephropathy, without long-term current use of insulin ([place]) 05/12/2015   COVID-19 long hauler manifesting chronic concentration deficit 06/01/2020   Diabetes mellitus ([place]) 05/12/2015   Elevated alkaline phosphatase level 08/07/2018   Normal Alk Phos Isoenzyme  Essential hypertension 01/04/2016   Family history of adverse reaction to anesthesia    father became aggressive   Gallstone pancreatitis    Gastric mass GIST 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    Health care maintenance 03/24/2013   Hepatic steatosis    History of right breast cancer 04/28/2021   History of right mastectomy 04/28/2021   Hx of adenomatous polyp of colon 12/12/2018   Hypercholesterolemia with  hypertriglyceridemia    Hypertension 01/04/2012   med d/c-last taken 12-07-11  -[facility] STOPPED HER B/P MEDICATION BECAUSE HER B/P TOO LOW AND PT STATES HER B/P'S HAVE BEEN OK SINCE.   Hypertriglyceridemia    Iron deficiency anemia 10/25/2018   Migraine without aura, without mention of intractable migraine without mention of status migrainosus    Overweight (BMI 25.0-29.9) 05/12/2015   Plantar fasciitis 09/05/2013   PONV (postoperative nausea and vomiting)    patient states that she did not get sick with mastectomy   Sciatica 05/17/2012   Patient with left leg radicular lumbar pain.  No concerning symptoms aside from pain. Plan: Trial of steroid dosepak and tramadol as needed for pain Discussed warning signs or symptoms. Please see discharge instructions. Patient expresses understanding. F/u 2 weeks if not improved.     Telogen effluvium 06/01/2020    Past Surgical History:  Procedure Laterality Date   CESAREAN SECTION     2002   CHOLECYSTECTOMY  03/25/2012   Procedure: LAPAROSCOPIC CHOLECYSTECTOMY WITH INTRAOPERATIVE CHOLANGIOGRAM;  Surgeon: Haywood Lasso, MD;  Location: [facility];  Service: General;  Laterality: N/A;  Laparoscopic Cholecystectomy with Intraoperative Cholangiogram   COLONOSCOPY     ERCP  12/05/2011   Procedure: ENDOSCOPIC RETROGRADE CHOLANGIOPANCREATOGRAPHY (ERCP);  Surgeon: Inda Castle, MD;  Location: [facility];  Service: Endoscopy;  Laterality: N/A;   ERCP  12/08/2011   Procedure: ENDOSCOPIC RETROGRADE CHOLANGIOPANCREATOGRAPHY (ERCP);  Surgeon: Inda Castle, MD;  Location: [place];  Service: Gastroenterology;  Laterality: N/A;   ESOPHAGOGASTRODUODENOSCOPY (EGD) WITH PROPOFOL N/A 09/28/2015   Procedure: ESOPHAGOGASTRODUODENOSCOPY (EGD) WITH PROPOFOL;  Surgeon: Clarene Essex, MD;  Location[facility];  Service: Endoscopy;  Laterality: N/A;  H AND P IN DRAWER   EUS N/A 10/13/2015   Procedure: UPPER ENDOSCOPIC ULTRASOUND (EUS) RADIAL;  Surgeon:  Arta Silence, MD;  Location: [facility];  Service: Endoscopy;  Laterality: N/A;   LAPAROSCOPIC PARTIAL GASTRECTOMY N/A 01/11/2016   Procedure: LAPAROSCOPIC PARTIAL GASTRECTOMY;  Surgeon: [name], MD;  Location: [place];  Service: General;  Laterality: N/A;   MASTECTOMY Right 09/2015   MASTECTOMY W/ SENTINEL NODE BIOPSY Right 09/15/2015   Procedure: RIGHT MASTECTOMY WITH SENTINEL LYMPH NODE BIOPSY;  Surgeon: Rolm Bookbinder, MD;  Location: [place];  Service: General;  Laterality: Right;   UPPER GI ENDOSCOPY N/A 01/11/2016   Procedure: UPPER GI ENDOSCOPY;  Surgeon: [name], MD;  Location: [place];  Service: General;  Laterality: N/A;    Current Medications: Current Meds  Medication Sig   Ascorbic Acid (VITAMIN C) 1000 MG tablet Take 1 tablet (1,000 mg total) by mouth daily.   Cholecalciferol (VITAMIN D3) 10000 units capsule Take 10,000 Units by mouth daily.   Cyanocobalamin (VITAMIN B12 PO) Take 1 capsule by mouth daily.   cycloSPORINE (RESTASIS) 0.05 % ophthalmic emulsion INSTILL 1 DROP INTO AFFECTED EYE EVERY 12 HOURS   fenofibrate (TRICOR) 145 MG tablet Take 145 mg by mouth 2 (two) times a week.  icosapent Ethyl (VASCEPA) 1 g capsule TAKE 2 CAPSULES BY MOUTH 2 TIMES DAILY.   metFORMIN (GLUCOPHAGE) 500 MG tablet TAKE 1 TABLET BY MOUTH EVERY MORNING WITH BREAKFAST   Multiple Vitamin (MULTIVITAMIN) tablet Take 1 tablet by mouth daily.   vitamin E 1000 UNIT capsule Take 1,000 Units by mouth daily.     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   Tobacco Use   Smoking status: Never   Smokeless tobacco: Never  Vaping Use   Vaping Use: Never used  Substance  and Sexual Activity   Alcohol use: No   Drug use: No   Sexual activity: Not Currently  Other Topics Concern   Not on file  Social History Narrative   Married, husband is a Dealer   2 young adult daughters   - spanish interpreter   + children   No caffeine   Social Determinants of Radio broadcast assistant Strain: Not on file  Food Insecurity: Not on file  Transportation Needs: Not on file  Physical Activity: Not on file  Stress: Not on file  Social Connections: Not on file     Family History: The patient's family history includes Diabetes in her brother, father, and mother; Heart disease in her paternal uncle; Liver disease in her brother. There is no history of Colon cancer, Esophageal cancer, Rectal cancer, or Stomach cancer.  ROS:   Please see the history of present illness.    All other systems reviewed and are negative.  EKGs/Labs/Other Studies Reviewed:    The following studies were reviewed today: I discussed my findings with the patient at length.   Recent Labs: 04/28/2021: ALT 13; BUN 9; Creatinine, Ser 0.53; Hemoglobin 12.9; Platelets 270; Potassium 4.3; Sodium 141  Recent Lipid Panel    Component Value Date/Time   CHOL 201 (H) 04/28/2021 1001   TRIG 287 (H) 04/28/2021 1001   HDL 51 04/28/2021 1001   CHOLHDL 3.9 04/28/2021 1001   CHOLHDL 5 05/15/2018 0938   VLDL 71.2 (H) 05/15/2018 0938   LDLCALC 102 (H) 04/28/2021 1001   LDLDIRECT 132.0 05/15/2018 0938    Physical Exam:    VS:  BP 122/80   Pulse (!) 110   Ht 5' 2"  (1.575 m)   Wt 140 lb 1.9 oz (63.6 kg)   LMP 11/29/2011   SpO2 97%   BMI 25.63 kg/m     Wt Readings from Last 3 Encounters:  06/02/21 140 lb 1.9 oz (63.6 kg)  05/25/21 143 lb 1.6 oz (64.9 kg)  04/28/21 138 lb 9.6 oz (62.9 kg)     GEN: Patient is in no acute distress HEENT: Normal NECK: No JVD; No carotid bruits LYMPHATICS: No lymphadenopathy CARDIAC: Hear sounds regular, 2/6 systolic murmur at the apex. RESPIRATORY:   Clear to auscultation without rales, wheezing or rhonchi  ABDOMEN: Soft, non-tender, non-distended MUSCULOSKELETAL:  No edema; No deformity  SKIN: Warm and dry NEUROLOGIC:  Alert and oriented x 3 PSYCHIATRIC:  Normal affect   Signed, Jenean Lindau, MD  06/02/2021 8:37 AM    [place]

## 2021-06-02 NOTE — Patient Instructions (Addendum)

## 2021-06-24 ENCOUNTER — Other Ambulatory Visit: Payer: Self-pay

## 2021-06-24 ENCOUNTER — Other Ambulatory Visit (HOSPITAL_COMMUNITY): Payer: Self-pay

## 2021-06-24 MED FILL — Metformin HCl Tab 500 MG: ORAL | 30 days supply | Qty: 30 | Fill #7 | Status: AC

## 2021-06-27 ENCOUNTER — Other Ambulatory Visit (HOSPITAL_COMMUNITY): Payer: Self-pay

## 2021-06-30 ENCOUNTER — Other Ambulatory Visit (HOSPITAL_COMMUNITY): Payer: Self-pay

## 2021-06-30 MED ORDER — CYCLOSPORINE 0.05 % OP EMUL
OPHTHALMIC | 3 refills | Status: DC
  Filled 2021-06-30: qty 180, 90d supply, fill #0
  Filled 2021-09-22: qty 180, 90d supply, fill #1

## 2021-07-01 ENCOUNTER — Other Ambulatory Visit (HOSPITAL_COMMUNITY): Payer: Self-pay

## 2021-07-26 ENCOUNTER — Other Ambulatory Visit (HOSPITAL_COMMUNITY): Payer: Self-pay

## 2021-07-26 MED FILL — Icosapent Ethyl Cap 1 GM: ORAL | 90 days supply | Qty: 360 | Fill #2 | Status: AC

## 2021-07-26 MED FILL — Metformin HCl Tab 500 MG: ORAL | 30 days supply | Qty: 30 | Fill #8 | Status: AC

## 2021-08-12 ENCOUNTER — Other Ambulatory Visit: Payer: Self-pay | Admitting: Cardiology

## 2021-08-12 ENCOUNTER — Other Ambulatory Visit (HOSPITAL_COMMUNITY): Payer: Self-pay

## 2021-08-13 ENCOUNTER — Other Ambulatory Visit (HOSPITAL_COMMUNITY): Payer: Self-pay

## 2021-08-16 ENCOUNTER — Other Ambulatory Visit (HOSPITAL_COMMUNITY): Payer: Self-pay

## 2021-08-30 ENCOUNTER — Other Ambulatory Visit (HOSPITAL_COMMUNITY): Payer: Self-pay

## 2021-08-30 MED FILL — Metformin HCl Tab 500 MG: ORAL | 30 days supply | Qty: 30 | Fill #9 | Status: AC

## 2021-09-22 ENCOUNTER — Other Ambulatory Visit: Payer: Self-pay | Admitting: Cardiology

## 2021-09-22 ENCOUNTER — Other Ambulatory Visit (HOSPITAL_COMMUNITY): Payer: Self-pay

## 2021-09-22 ENCOUNTER — Other Ambulatory Visit: Payer: Self-pay | Admitting: Nurse Practitioner

## 2021-09-22 DIAGNOSIS — E781 Pure hyperglyceridemia: Secondary | ICD-10-CM

## 2021-09-22 DIAGNOSIS — E782 Mixed hyperlipidemia: Secondary | ICD-10-CM

## 2021-09-22 DIAGNOSIS — E1169 Type 2 diabetes mellitus with other specified complication: Secondary | ICD-10-CM

## 2021-09-22 MED ORDER — ICOSAPENT ETHYL 1 G PO CAPS
ORAL_CAPSULE | ORAL | 2 refills | Status: DC
  Filled 2021-09-22: qty 360, fill #0
  Filled 2021-10-06: qty 360, 90d supply, fill #0
  Filled 2021-12-21: qty 360, 90d supply, fill #1
  Filled 2022-05-19: qty 360, 90d supply, fill #2

## 2021-09-23 ENCOUNTER — Other Ambulatory Visit (HOSPITAL_COMMUNITY): Payer: Self-pay

## 2021-09-23 MED ORDER — METFORMIN HCL 500 MG PO TABS
ORAL_TABLET | Freq: Every day | ORAL | 2 refills | Status: DC
  Filled 2021-09-23: qty 30, 30d supply, fill #0
  Filled 2021-11-02: qty 30, 30d supply, fill #1
  Filled 2021-12-05: qty 30, 30d supply, fill #2

## 2021-10-05 NOTE — Progress Notes (Signed)
? ? ?10/06/2021 ?Nichole Wilson ?[id number] ?27-May-1961 ? ? ?ASSESSMENT AND PLAN:  ? ?Dyspepsia ?-     CBC with Differential/Platelet; Future ?-     Comprehensive metabolic panel; Future ?-     TSH; Future ?-     C-reactive protein; Future ?-     pantoprazole (PROTONIX) 40 MG tablet; Take 1 tablet (40 mg total) by mouth daily before breakfast. ?08/2018 EGD  for slight hematemesis in February and that showed a small erosion at her GE junction, small hiatal hernia, mild erosive gastritis, with otherwise negative postoperative exam, she had a proximal gist resected in 2017 ?We will increase Protonix from 20 to 40 mg daily. ?Counseled on lifestyle. ?With patient's history and last endoscopy 3 years ago we will schedule for repeat endoscopy. ?If this is negative consider gastric emptying study. ?Gave information regarding gastroparesis. ? ?Hepatic steatosis on CT ?-     Comprehensive metabolic panel; Future ?--Continue to work on risk factor modification including diet exercise and control of risk factors including blood sugars. ? ?Gastric mass GIST vs leiomyoma ?S/p resection 2017 ?04/23/2020 CT abdomen pelvis without contrast right lower quadrant pain shows hazy bibasilar airspace, status post cholecystectomy, hepatic steatosis otherwise normal. ? ? ?History of Present Illness:  ?61 y.o. female  with a past medical history of anal fissure, history of breast cancer, elevated LFTs, hepatic steatosis status post cholecystectomy and others listed below, known to Dr. [name] returns to clinic today for evaluation of abdominal pain. ? ?Last seen 2020 for IDA. Had HGB drop from 24 to 7, ferritin 4, FOBT negative. MM panel negative,  ?04/23/2020 CT abdomen pelvis without contrast right lower quadrant pain shows hazy bibasilar airspace, status post cholecystectomy, hepatic steatosis otherwise normal. ?12/05/18 Colonoscopy for IDA  ?08/2018 EGD  for slight hematemesis in February and that showed a small erosion at her GE junction,  small hiatal hernia, mild erosive gastritis, with otherwise negative postoperative exam, she had a proximal gist resected in 2017 ?10/21/2018 CT of the chest abdomen and pelvis in early April which showed her small hiatal hernia, and 2 new small adjacent groundglass density pulmonary nodules in the left lower lobe, some DJD of the spine, and diverticulosis ? ? She had a ANA positive in the past negative in January, cortisol level Normal.  ACTH normal as well.  Hepatitis panel normal.  Acute.  Alkaline phosphatase fractions normal. ? ?She states in 2017 after her mastectomy she had EGD with food retention and she states she was told she had gastroparesis but never had formal study.  ?Did research for gastroparesis and started papya and pineapple before her meals, drank very small amount of wine with meals but has stopped this in last year.  ?Has been worsening epigastric discomfort, nausea, vomiting x 2-3 months.  ?She states her stomach is swelling even with water.  ?She has epigastric AB discomfort with nausea and vomiting, will vomit 2-3 a week after dinner, and feel anxious.  ?She will chew mind gum normally but this has not helped.  ?She is having to eat soft diet, avoiding food.  ?Fasting helped but she felt weak, has done liquid protein.  ? ?Denies melena, hematochezia.  ?Denies weight loss.  ?She is metofmrin ?Has been taking 2 bASA for headaches,  ?No ETOH ? ?CBC  04/28/2021  ?HGB 12.9 MCV 92 without evidence of anemia ?WBC 5.1 Platelets 270 ?Anemia panel 12/12/2018  ?Iron 85 Ferritin 128  ?10/18/2018  B12 742 ?Kidney function 04/28/2021  ?BUN 9  Cr 0.53  ?GFR 98  ?Potassium 4.3   ?LFTs 04/28/2021  ?AST 10 ALT 13 ?Alkphos 149 TBili 0.5 ?04/12/2020 LIPASE 33 ?04/26/2020 TSH 1.160 ? ?Current Medications:  ? ?Current Outpatient Medications (Endocrine & Metabolic):  ?  metFORMIN (GLUCOPHAGE) 500 MG tablet, TAKE 1 TABLET BY MOUTH EVERY MORNING WITH BREAKFAST ? ?Current Outpatient Medications (Cardiovascular):  ?   fenofibrate (TRICOR) 145 MG tablet, Take 145 mg by mouth 2 (two) times a week. ?  icosapent Ethyl (VASCEPA) 1 g capsule, TAKE 2 CAPSULES BY MOUTH 2 TIMES DAILY. ? ? ? ?Current Outpatient Medications (Hematological):  ?  Cyanocobalamin (VITAMIN B12 PO), Take 1 capsule by mouth daily. ? ?Current Outpatient Medications (Other):  ?  Ascorbic Acid (VITAMIN C) 1000 MG tablet, Take 1 tablet (1,000 mg total) by mouth daily. ?  Cholecalciferol (VITAMIN D3) 10000 units capsule, Take 10,000 Units by mouth daily. ?  cycloSPORINE (RESTASIS) 0.05 % ophthalmic emulsion, INSTILL 1 DROP INTO AFFECTED EYE EVERY 12 HOURS ?  Multiple Vitamin (MULTIVITAMIN) tablet, Take 1 tablet by mouth daily. ?  pantoprazole (PROTONIX) 40 MG tablet, Take 1 tablet (40 mg total) by mouth daily before breakfast. ?  vitamin E 1000 UNIT capsule, Take 1,000 Units by mouth daily. ? ?Surgical History:  ?She  has a past surgical history that includes Cesarean section; ERCP (12/05/2011); ERCP (12/08/2011); Cholecystectomy (03/25/2012); Mastectomy w/ sentinel node biopsy (Right, 09/15/2015); Esophagogastroduodenoscopy (egd) with propofol (N/A, 09/28/2015); EUS (N/A, 10/13/2015); Colonoscopy; Laparoscopic partial gastrectomy (N/A, 01/11/2016); Upper gi endoscopy (N/A, 01/11/2016); and Mastectomy (Right, 09/2015). ?Family History:  ?Her family history includes Diabetes in her brother, father, and mother; Heart disease in her paternal uncle; Liver disease in her brother. ?Social History:  ? reports that she has never smoked. She has never used smokeless tobacco. She reports that she does not drink alcohol and does not use drugs. ? ?Current Medications, Allergies, Past Medical History, Past Surgical History, Family History and Social History were reviewed in [employer] record. ? ?Physical Exam: ?BP 120/82   Pulse 93   Ht '5\' 2"'$  (1.575 m)   Wt 143 lb 12.8 oz (65.2 kg)   LMP 11/29/2011   BMI 26.30 kg/m?  ?General:   Pleasant, well developed female in  no acute distress ?Eyes: sclerae anicteric,conjunctive pink  ?Heart:  regular rate and rhythm ?Pulm: Clear anteriorly; no wheezing ?Abdomen:  Soft, Obese AB, skin exam normal, Normal bowel sounds. mild tenderness in the epigastrium. Without guarding and Without rebound, without hepatomegaly. ?Extremities:  Without edema. Peripheral pulses intact.  ?Neurologic:  Alert and  oriented x4;  grossly normal neurologically. ?Skin:   Dry and intact without significant lesions or rashes. ?Psychiatric: Demonstrates good judgement and reason without abnormal affect or behaviors. ? ?Vladimir Crofts, PA-C ?10/06/21 ?

## 2021-10-06 ENCOUNTER — Encounter: Payer: Self-pay | Admitting: Physician Assistant

## 2021-10-06 ENCOUNTER — Other Ambulatory Visit (INDEPENDENT_AMBULATORY_CARE_PROVIDER_SITE_OTHER): Payer: No Typology Code available for payment source

## 2021-10-06 ENCOUNTER — Other Ambulatory Visit (HOSPITAL_COMMUNITY): Payer: Self-pay

## 2021-10-06 ENCOUNTER — Ambulatory Visit (INDEPENDENT_AMBULATORY_CARE_PROVIDER_SITE_OTHER): Payer: No Typology Code available for payment source | Admitting: Physician Assistant

## 2021-10-06 VITALS — BP 120/82 | HR 93 | Ht 62.0 in | Wt 143.8 lb

## 2021-10-06 DIAGNOSIS — K760 Fatty (change of) liver, not elsewhere classified: Secondary | ICD-10-CM

## 2021-10-06 DIAGNOSIS — K3189 Other diseases of stomach and duodenum: Secondary | ICD-10-CM

## 2021-10-06 DIAGNOSIS — R1013 Epigastric pain: Secondary | ICD-10-CM

## 2021-10-06 LAB — CBC WITH DIFFERENTIAL/PLATELET
Basophils Absolute: 0 10*3/uL (ref 0.0–0.1)
Basophils Relative: 0.3 % (ref 0.0–3.0)
Eosinophils Absolute: 0.1 10*3/uL (ref 0.0–0.7)
Eosinophils Relative: 0.8 % (ref 0.0–5.0)
HCT: 37.2 % (ref 36.0–46.0)
Hemoglobin: 12.8 g/dL (ref 12.0–15.0)
Lymphocytes Relative: 39.3 % (ref 12.0–46.0)
Lymphs Abs: 2.6 10*3/uL (ref 0.7–4.0)
MCHC: 34.4 g/dL (ref 30.0–36.0)
MCV: 93.6 fl (ref 78.0–100.0)
Monocytes Absolute: 0.5 10*3/uL (ref 0.1–1.0)
Monocytes Relative: 7 % (ref 3.0–12.0)
Neutro Abs: 3.4 10*3/uL (ref 1.4–7.7)
Neutrophils Relative %: 52.6 % (ref 43.0–77.0)
Platelets: 318 10*3/uL (ref 150.0–400.0)
RBC: 3.97 Mil/uL (ref 3.87–5.11)
RDW: 14.1 % (ref 11.5–15.5)
WBC: 6.5 10*3/uL (ref 4.0–10.5)

## 2021-10-06 LAB — COMPREHENSIVE METABOLIC PANEL
ALT: 12 U/L (ref 0–35)
AST: 13 U/L (ref 0–37)
Albumin: 4.5 g/dL (ref 3.5–5.2)
Alkaline Phosphatase: 94 U/L (ref 39–117)
BUN: 15 mg/dL (ref 6–23)
CO2: 29 mEq/L (ref 19–32)
Calcium: 9.2 mg/dL (ref 8.4–10.5)
Chloride: 103 mEq/L (ref 96–112)
Creatinine, Ser: 0.56 mg/dL (ref 0.40–1.20)
GFR: 100.34 mL/min (ref >60.00)
Glucose, Bld: 113 mg/dL — ABNORMAL HIGH (ref 70–99)
Potassium: 4.1 mEq/L (ref 3.5–5.1)
Sodium: 140 mEq/L (ref 135–145)
Total Bilirubin: 0.6 mg/dL (ref 0.2–1.2)
Total Protein: 7.2 g/dL (ref 6.0–8.3)

## 2021-10-06 LAB — TSH: TSH: 0.83 u[IU]/mL (ref 0.35–5.50)

## 2021-10-06 LAB — C-REACTIVE PROTEIN: CRP: <1 mg/dL (ref 0.5–20.0)

## 2021-10-06 MED ORDER — PANTOPRAZOLE SODIUM 40 MG PO TBEC
40.0000 mg | DELAYED_RELEASE_TABLET | Freq: Every day | ORAL | 0 refills | Status: AC
Start: 2021-10-06 — End: ?
  Filled 2021-10-06: qty 90, 90d supply, fill #0

## 2021-10-06 NOTE — Patient Instructions (Addendum)
If you are age 59 or older, your body mass index should be between 23-30. Your Body mass index is 26.3 kg/m?[name] If this is out of the aforementioned range listed, please consider follow up with your Primary Care Provider. ? ?If you are age 62 or younger, your body mass index should be between 19-25. Your Body mass index is 26.3 kg/m?[name] If this is out of the aformentioned range listed, please consider follow up with your Primary Care Provider.  ? ?________________________________________________________ ? ?The  GI providers would like to encourage you to use [facility] to communicate with providers for non-urgent requests or questions.  Due to long hold times on the telephone, sending your provider a message by [facility] may be a faster and more efficient way to get a response.  Please allow 48 business hours for a response.  Please remember that this is for non-urgent requests.  ?____________________________________________________ ? ?Your provider has requested that you go to the basement level for lab work before leaving today. Press "B" on the elevator. The lab is located at the first door on the left as you exit the elevator. ? ? ?Gastroparesis ?Please do small frequent meals like 4-6 meals a day.  ?Eat and drink liquids at separate times.  ?Avoid high fiber foods, cook your vegetables, avoid high fat food.  ?Suggest spreading protein throughout the day (greek yogurt, glucerna, soft meat, milk, eggs) ?Choose soft foods that you can mash with a fork ?When you are more symptomatic, change to pureed foods foods and liquids.  ?Consider reading "Living well with Gastroparesis" by Lambert Keto ?Gastroparesis is a condition in which food takes longer than normal to empty from the stomach. This condition is also known as delayed gastric emptying. It is usually a long-term (chronic) condition. ?There is no cure, but there are treatments and things that you can do at home to help relieve symptoms. Treating the  underlying condition that causes gastroparesis can also help relieve symptoms ?What are the causes? ?In many cases, the cause of this condition is not known. Possible causes include: ?A hormone (endocrine) disorder, such as hypothyroidism or diabetes. ?A nervous system disease, such as Parkinson's disease or multiple sclerosis. ?Cancer, infection, or surgery that affects the stomach or vagus nerve. The vagus nerve runs from your chest, through your neck, and to the lower part of your brain. ?A connective tissue disorder, such as scleroderma. ?Certain medicines. ?What increases the risk? ?You are more likely to develop this condition if: ?You have certain disorders or diseases. These may include: ?An endocrine disorder. ?An eating disorder. ?Amyloidosis. ?Scleroderma. ?Parkinson's disease. ?Multiple sclerosis. ?Cancer or infection of the stomach or the vagus nerve. ?You have had surgery on your stomach or vagus nerve. ?You take certain medicines. ?You are female. ?What are the signs or symptoms? ?Symptoms of this condition include: ?Feeling full after eating very little or a loss of appetite. ?Nausea, vomiting, or heartburn. ?Bloating of your abdomen. ?Inconsistent blood sugar (glucose) levels on blood tests. ?Unexplained weight loss. ?Acid from the stomach coming up into the esophagus (gastroesophageal reflux). ?Sudden tightening (spasm) of the stomach, which can be painful. ?Symptoms may come and go. Some people may not notice any symptoms. ?How is this diagnosed? ?This condition is diagnosed with tests, such as: ?Tests that check how long it takes food to move through the stomach and intestines. These tests include: ?Upper gastrointestinal (GI) series. For this test, you drink a liquid that shows up well on X-rays, and then X-rays are taken  of your intestines. ?Gastric emptying scintigraphy. For this test, you eat food that contains a small amount of radioactive material, and then scans are taken. ?Wireless  capsule GI monitoring system. For this test, you swallow a pill (capsule) that records information about how foods and fluid move through your stomach. ?Gastric manometry. For this test, a tube is passed down your throat and into your stomach to measure electrical and muscular activity. ?Endoscopy. For this test, a long, thin tube with a camera and light on the end is passed down your throat and into your stomach to check for problems in your stomach lining. ?Ultrasound. This test uses sound waves to create images of the inside of your body. This can help rule out gallbladder disease or pancreatitis as a cause of your symptoms. ?How is this treated? ?There is no cure for this condition, but treatment and home care may relieve symptoms. Treatment may include: ?Treating the underlying cause. ?Managing your symptoms by making changes to your diet and exercise habits. ?Taking medicines to control nausea and vomiting and to stimulate stomach muscles. ?Getting food through a feeding tube in the hospital. This may be done in severe cases. ?Having surgery to insert a device called a gastric electrical stimulator into your body. This device helps improve stomach emptying and control nausea and vomiting. ?Follow these instructions at home: ?Take over-the-counter and prescription medicines only as told by your health care provider. ?Follow instructions from your health care provider about eating or drinking restrictions. Your health care provider may recommend that you: ?Eat smaller meals more often. ?Eat low-fat foods. ?Eat low-fiber forms of high-fiber foods. For example, eat cooked vegetables instead of raw vegetables. ?Have only liquid foods instead of solid foods. Liquid foods are easier to digest. ?Drink enough fluid to keep your urine pale yellow. ?Exercise as often as told by your health care provider. ?Keep all follow-up visits. This is important. ?Contact a health care provider if you: ?Notice that your symptoms do  not improve with treatment. ?Have new symptoms. ?Get help right away if you: ?Have severe pain in your abdomen that does not improve with treatment. ?Have nausea that is severe or does not go away. ?Vomit every time you drink fluids. ?Summary ?Gastroparesis is a long-term (chronic) condition in which food takes longer than normal to empty from the stomach. ?Symptoms include nausea, vomiting, heartburn, bloating of your abdomen, and loss of appetite. ?Eating smaller portions, low-fat foods, and low-fiber forms of high-fiber foods may help you manage your symptoms. ?Get help right away if you have severe pain in your abdomen. ?This information is not intended to replace advice given to you by your health care provider. Make sure you discuss any questions you have with your health care provider. ?Document Revised: 11/10/2019 Document Reviewed: 11/10/2019 ?Elsevier Patient Education ? [place]. ? ?Please take your proton pump inhibitor medication 30 minutes to 1 hour before meals- this makes it more effective.  ?Avoid spicy and acidic foods ?Avoid fatty foods ?Limit your intake of coffee, tea, alcohol, and carbonated drinks ?Work to maintain a healthy weight ?Keep the head of the bed elevated at least 3 inches with blocks or a wedge pillow if you are having any nighttime symptoms ?Stay upright for 2 hours after eating ?Avoid meals and snacks three to four hours before bedtime ?Stop smoking ? ?Gastroesophageal Reflux Disease, Adult ?Gastroesophageal reflux (GER) happens when acid from the stomach flows up into the tube that connects the mouth and the stomach (esophagus).  Normally, food travels down the esophagus and stays in the stomach to be digested. However, when a person has GER, food and stomach acid sometimes move back up into the esophagus. If this becomes a more serious problem, the person may be diagnosed with a disease called gastroesophageal reflux disease (GERD). GERD occurs when the reflux: ?Happens  often. ?Causes frequent or severe symptoms. ?Causes problems such as damage to the esophagus. ?When stomach acid comes in contact with the esophagus, the acid may cause inflammation in the esophagus. Over time,

## 2021-10-26 ENCOUNTER — Ambulatory Visit: Payer: No Typology Code available for payment source | Admitting: Internal Medicine

## 2021-11-02 ENCOUNTER — Other Ambulatory Visit (HOSPITAL_COMMUNITY): Payer: Self-pay

## 2021-11-17 ENCOUNTER — Encounter: Payer: Self-pay | Admitting: Internal Medicine

## 2021-11-17 ENCOUNTER — Ambulatory Visit (AMBULATORY_SURGERY_CENTER): Payer: No Typology Code available for payment source | Admitting: Internal Medicine

## 2021-11-17 VITALS — BP 145/98 | HR 82 | Temp 98.7°F | Resp 16 | Ht 62.0 in | Wt 143.0 lb

## 2021-11-17 DIAGNOSIS — R112 Nausea with vomiting, unspecified: Secondary | ICD-10-CM | POA: Diagnosis not present

## 2021-11-17 DIAGNOSIS — K2970 Gastritis, unspecified, without bleeding: Secondary | ICD-10-CM | POA: Diagnosis not present

## 2021-11-17 DIAGNOSIS — R1013 Epigastric pain: Secondary | ICD-10-CM | POA: Diagnosis not present

## 2021-11-17 DIAGNOSIS — K449 Diaphragmatic hernia without obstruction or gangrene: Secondary | ICD-10-CM

## 2021-11-17 MED ORDER — SODIUM CHLORIDE 0.9 % IV SOLN: 500.0000 mL | Freq: Once | INTRAVENOUS | Status: DC

## 2021-11-17 NOTE — Progress Notes (Signed)
Called to room to assist during endoscopic procedure.  Patient ID and intended procedure confirmed with present staff. Received instructions for my participation in the procedure from the performing physician.  

## 2021-11-17 NOTE — Patient Instructions (Addendum)
There was some inflammation in the stomach. I took biopsies. ? ?Once I see the results we will contact you with further recommendations. ? ?I appreciate the opportunity to care for you. ?Gatha Mayer, MD, [facility] ? ?YOU HAD AN ENDOSCOPIC PROCEDURE TODAY AT [facility]:   Refer to the procedure report that was given to you for any specific questions about what was found during the examination.  If the procedure report does not answer your questions, please call your gastroenterologist to clarify.  If you requested that your care partner not be given the details of your procedure findings, then the procedure report has been included in a sealed envelope for you to review at your convenience later. ? ?YOU SHOULD EXPECT: Some feelings of bloating in the abdomen. Passage of more gas than usual.  Walking can help get rid of the air that was put into your GI tract during the procedure and reduce the bloating. If you had a lower endoscopy (such as a colonoscopy or flexible sigmoidoscopy) you may notice spotting of blood in your stool or on the toilet paper. If you underwent a bowel prep for your procedure, you may not have a normal bowel movement for a few days. ? ?Please Note:  You might notice some irritation and congestion in your nose or some drainage.  This is from the oxygen used during your procedure.  There is no need for concern and it should clear up in a day or so. ? ?SYMPTOMS TO REPORT IMMEDIATELY: ? ? ?Following upper endoscopy (EGD) ? Vomiting of blood or coffee ground material ? New chest pain or pain under the shoulder blades ? Painful or persistently difficult swallowing ? New shortness of breath ? Fever of 100?F or higher ? Black, tarry-looking stools ? ?For urgent or emergent issues, a gastroenterologist can be reached at any hour by calling [phone]. ?Do not use MyChart messaging for urgent concerns.  ? ? ?DIET:  We do recommend a small meal at first, but then you may proceed to  your regular diet.  Drink plenty of fluids but you should avoid alcoholic beverages for 24 hours. ? ?ACTIVITY:  You should plan to take it easy for the rest of today and you should NOT DRIVE or use heavy machinery until tomorrow (because of the sedation medicines used during the test).   ? ?FOLLOW UP: ?Our staff will call the number listed on your records 48-72 hours following your procedure to check on you and address any questions or concerns that you may have regarding the information given to you following your procedure. If we do not reach you, we will leave a message.  We will attempt to reach you two times.  During this call, we will ask if you have developed any symptoms of COVID 19. If you develop any symptoms (ie: fever, flu-like symptoms, shortness of breath, cough etc.) before then, please call [phone].  If you test positive for Covid 19 in the 2 weeks post procedure, please call and report this information to [place].   ? ?If any biopsies were taken you will be contacted by phone or by letter within the next 1-3 weeks.  Please call us at [phone] if you have not heard about the biopsies in 3 weeks.  ? ? ?SIGNATURES/CONFIDENTIALITY: ?You and/or your care partner have signed paperwork which will be entered into your electronic medical record.  These signatures attest to the fact that that the information above on your After  Visit Summary has been reviewed and is understood.  Full responsibility of the confidentiality of this discharge information lies with you and/or your care-partner.  ?

## 2021-11-17 NOTE — Progress Notes (Signed)
[facility] History and Physical ? ? ?Primary Care Physician:  [name], FNP ? ? ?Reason for Procedure:   Dyspepsia - nausea/vomiting, epigastric pain ? ?Plan:    EGD ? ? ? ? ?HPI: Nichole Wilson is a 61 y.o. female  ?Last seen 2020 for IDA. Had HGB drop from 24 to 7, ferritin 4, FOBT negative. MM panel negative,  ?04/23/2020 CT abdomen pelvis without contrast right lower quadrant pain shows hazy bibasilar airspace, status post cholecystectomy, hepatic steatosis otherwise normal. ?12/05/18 Colonoscopy for IDA  ?08/2018 EGD  for slight hematemesis in February and that showed a small erosion at her GE junction, small hiatal hernia, mild erosive gastritis, with otherwise negative postoperative exam, she had a proximal gist resected in 2017 ?10/21/2018 CT of the chest abdomen and pelvis in early April which showed her small hiatal hernia, and 2 new small adjacent groundglass density pulmonary nodules in the left lower lobe, some DJD of the spine, and diverticulosis ?  ? She had a ANA positive in the past negative in January, cortisol level Normal.  ACTH normal as well.  Hepatitis panel normal.  Acute.  Alkaline phosphatase fractions normal. ?  ?She states in 2017 after her mastectomy she had EGD with food retention and she states she was told she had gastroparesis but never had formal study.  ?Did research for gastroparesis and started papya and pineapple before her meals, drank very small amount of wine with meals but has stopped this in last year.  ?Has been worsening epigastric discomfort, nausea, vomiting x 2-3 months.  ?She states her stomach is swelling even with water.  ?She has epigastric AB discomfort with nausea and vomiting, will vomit 2-3 a week after dinner, and feel anxious.  ?She will chew mind gum normally but this has not helped.  ?She is having to eat soft diet, avoiding food.  ?Fasting helped but she felt weak, has done liquid protein.  ?  ?Denies melena, hematochezia.  ?Denies  weight loss.  ?She is metofmrin ?Has been taking 2 bASA for headaches,  ?No ETOH ? ? ?Past Medical History:  ?Diagnosis Date  ? Abnormal LFTs 10/31/2018  ? ANA positive 08/06/2017  ? Anal fissure - posterior 11/26/2015  ? Anemia   ? Anterior scleritis of both eyes 05/12/2015  ? Followed by Dr Teola Bradley - Last OV 11/10, 11/18 - Update 11/18 - dx Acute toxic conjunctivitis > improved s/p prednisolone - Primary iridocyclitis (R-eye) - begin taper of prednisolone eye drops, much improved - Requested labs all resulted 11/16: ESR (21), ANA (neg), RPR (non-reactive, no FTA-Ab) >> faxed results back to Dr Sharen Counter   ? Blood transfusion without reported diagnosis   ? Breast cancer of upper-outer quadrant of right female breast ([place]) 08/26/2015  ? Breast cyst, left 01/04/2012  ? cyst no longer apparent  ? Cholelithiasis with choledocholithiasis   ? Complication of anesthesia 2002  ? PT STATES DURING HER C-SECTION SHE WAS AWARE OF INCISION AND PAIN -THEN WAS QUICKLY "PUT OUT"  ? Controlled type 2 diabetes mellitus with diabetic nephropathy, without long-term current use of insulin ([place]) 05/12/2015  ? COVID-19 long hauler manifesting chronic concentration deficit 06/01/2020  ? Diabetes mellitus ([place]) 05/12/2015  ? Elevated alkaline phosphatase level 08/07/2018  ? Normal Alk Phos Isoenzyme  ? Essential hypertension 01/04/2016  ? Family history of adverse reaction to anesthesia   ? father became aggressive  ? Gallstone pancreatitis   ? 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 Glucose intolerance (  impaired glucose tolerance)   ? since at least 2011.   ? Health care maintenance 03/24/2013  ? Hepatic steatosis   ? History of right breast cancer 04/28/2021  ? History of right mastectomy 04/28/2021  ? Hx of adenomatous polyp of colon 12/12/2018  ? Hypercholesterolemia with hypertriglyceridemia   ? Hypertension 01/04/2012  ? med d/c-last taken 12-07-11  -PT [place] STOPPED HER B/P MEDICATION BECAUSE HER B/P TOO LOW  AND PT STATES HER B/P'S HAVE BEEN OK SINCE.  ? Hypertriglyceridemia   ? Iron deficiency anemia 10/25/2018  ? Migraine without aura, without mention of intractable migraine without mention of status migrainosus   ? Overweight (BMI 25.0-29.9) 05/12/2015  ? Plantar fasciitis 09/05/2013  ? PONV (postoperative nausea and vomiting)   ? patient states that she did not get sick with mastectomy  ? Sciatica 05/17/2012  ? Patient with left leg radicular lumbar pain.  No concerning symptoms aside from pain. Plan: Trial of steroid dosepak and tramadol as needed for pain Discussed warning signs or symptoms. Please see discharge instructions. Patient expresses understanding. F/u 2 weeks if not improved.    ? Telogen effluvium 06/01/2020  ? ? ?Past Surgical History:  ?Procedure Laterality Date  ? CESAREAN SECTION    ? 2002  ? CHOLECYSTECTOMY  03/25/2012  ? Procedure: LAPAROSCOPIC CHOLECYSTECTOMY WITH INTRAOPERATIVE CHOLANGIOGRAM;  Surgeon: Haywood Lasso, MD;  Location: [facility];  Service: General;  Laterality: N/A;  Laparoscopic Cholecystectomy with Intraoperative Cholangiogram  ? COLONOSCOPY    ? ERCP  12/05/2011  ? Procedure: ENDOSCOPIC RETROGRADE CHOLANGIOPANCREATOGRAPHY (ERCP);  Surgeon: Inda Castle, MD;  Location: [facility];  Service: Endoscopy;  Laterality: N/A;  ? ERCP  12/08/2011  ? Procedure: ENDOSCOPIC RETROGRADE CHOLANGIOPANCREATOGRAPHY (ERCP);  Surgeon: Inda Castle, MD;  Location: [facility];  Service: Gastroenterology;  Laterality: N/A;  ? ESOPHAGOGASTRODUODENOSCOPY (EGD) WITH PROPOFOL N/A 09/28/2015  ? Procedure: ESOPHAGOGASTRODUODENOSCOPY (EGD) WITH PROPOFOL;  Surgeon: Clarene Essex, MD;  Location[facility];  Service: Endoscopy;  Laterality: N/A;  H AND P IN DRAWER  ? EUS N/A 10/13/2015  ? Procedure: UPPER ENDOSCOPIC ULTRASOUND (EUS) RADIAL;  Surgeon: Arta Silence, MD;  Location: [facility];  Service: Endoscopy;  Laterality: N/A;  ? LAPAROSCOPIC PARTIAL GASTRECTOMY N/A 01/11/2016  ? Procedure: LAPAROSCOPIC  PARTIAL GASTRECTOMY;  Surgeon: [name], MD;  Location: [place];  Service: General;  Laterality: N/A;  ? MASTECTOMY Right 09/2015  ? MASTECTOMY W/ SENTINEL NODE BIOPSY Right 09/15/2015  ? Procedure: RIGHT MASTECTOMY WITH SENTINEL LYMPH NODE BIOPSY;  Surgeon: Rolm Bookbinder, MD;  Location: [place];  Service: General;  Laterality: Right;  ? UPPER GI ENDOSCOPY N/A 01/11/2016  ? Procedure: UPPER GI ENDOSCOPY;  Surgeon: [name], MD;  Location: [place];  Service: General;  Laterality: N/A;  ? ? ?Prior to Admission medications   ?Medication Sig Start Date End Date Taking? Authorizing Provider  ?Ascorbic Acid (VITAMIN C) 1000 MG tablet Take 1 tablet (1,000 mg total) by mouth daily. 05/25/21  Yes Nicholas Lose, MD  ?aspirin EC 81 MG tablet Take 81 mg by mouth daily. Swallow whole.   Yes [provider]  ?Cholecalciferol (VITAMIN D3) 10000 units capsule Take 10,000 Units by mouth daily.   Yes [provider]  ?Cyanocobalamin (VITAMIN B12 PO) Take 1 capsule by mouth daily.   Yes [provider]  ?cycloSPORINE (RESTASIS) 0.05 % ophthalmic emulsion INSTILL 1 DROP INTO AFFECTED EYE EVERY 12 HOURS 06/30/21  Yes   ?icosapent Ethyl (VASCEPA) 1 g capsule TAKE  2 CAPSULES BY MOUTH 2 TIMES DAILY. 09/22/21 09/22/22 Yes Revankar, Reita Cliche, MD  ?metFORMIN (GLUCOPHAGE) 500 MG tablet TAKE 1 TABLET BY MOUTH EVERY MORNING WITH BREAKFAST 09/23/21 09/23/22 Yes [name], FNP  ?Multiple Vitamin (MULTIVITAMIN) tablet Take 1 tablet by mouth daily.   Yes [provider]  ?pantoprazole (PROTONIX) 40 MG tablet Take 1 tablet (40 mg total) by mouth daily before breakfast. 10/06/21  Yes [name] R, PA-C  ?vitamin E 1000 UNIT capsule Take 1,000 Units by mouth daily.   Yes [provider]  ? ? ?Current Outpatient Medications  ?Medication Sig Dispense Refill  ? Ascorbic Acid (VITAMIN C) 1000 MG tablet Take 1 tablet (1,000 mg total) by mouth daily.    ? aspirin EC 81 MG tablet Take 81 mg by mouth daily.  Swallow whole.    ? Cholecalciferol (VITAMIN D3) 10000 units capsule Take 10,000 Units by mouth daily.    ? Cyanocobalamin (VITAMIN B12 PO) Take 1 capsule by mouth daily.    ? cycloSPORINE (RESTASIS) 0.05 % ophtha

## 2021-11-17 NOTE — Op Note (Signed)
[facility] ?Patient Name: Nichole Wilson ?Procedure Date: 11/17/2021 3:43 PM ?MRN: [id number] ?Endoscopist: Gatha Mayer , MD ?Age: 61 ?Referring MD:  ?Date of Birth: [birth year] ?Gender: Female ?Account #: [id number] ?Procedure:                Upper GI endoscopy ?Indications:              Epigastric abdominal pain, Dyspepsia, Nausea with  ?                          vomiting ?Medicines:                Monitored Anesthesia Care ?Procedure:                Pre-Anesthesia Assessment: ?                          - Prior to the procedure, a History and Physical  ?                          was performed, and patient medications and  ?                          allergies were reviewed. The patient's tolerance of  ?                          previous anesthesia was also reviewed. The risks  ?                          and benefits of the procedure and the sedation  ?                          options and risks were discussed with the patient.  ?                          All questions were answered, and informed consent  ?                          was obtained. Prior Anticoagulants: The patient has  ?                          taken no previous anticoagulant or antiplatelet  ?                          agents. ASA Grade Assessment: II - A patient with  ?                          mild systemic disease. After reviewing the risks  ?                          and benefits, the patient was deemed in  ?                          satisfactory condition to undergo the procedure. ?  After obtaining informed consent, the endoscope was  ?                          passed under direct vision. Throughout the  ?                          procedure, the patient's blood pressure, pulse, and  ?                          oxygen saturations were monitored continuously. The  ?                          GIF [id number] #[id number] was introduced through the  ?                          mouth, and advanced to the second part of duodenum.   ?                          The upper GI endoscopy was accomplished without  ?                          difficulty. The patient tolerated the procedure  ?                          well. ?Scope In: ?Scope Out: ?Findings:                 Patchy inflammation characterized by erosions and  ?                          erythema was found in the gastric antrum. Biopsies  ?                          were taken with a cold forceps for histology.  ?                          Verification of patient identification for the  ?                          specimen was done. Estimated blood loss was minimal. ?                          The gastroesophageal flap valve was visualized  ?                          endoscopically and classified as Hill Grade III  ?                          (minimal fold, loose to endoscope, hiatal hernia  ?                          likely). ?                          A small hiatal hernia was present. ?  The exam was otherwise without abnormality. ?                          The cardia and gastric fundus were otherwise normal  ?                          on retroflexion. ?Complications:            No immediate complications. ?Estimated Blood Loss:     Estimated blood loss was minimal. ?Impression:               - Gastritis. Biopsied. ?                          - Gastroesophageal flap valve classified as Hill  ?                          Grade III (minimal fold, loose to endoscope, hiatal  ?                          hernia likely). ?                          - Small hiatal hernia. ?                          - The examination was otherwise normal. ?Recommendation:           - Patient has a contact number available for  ?                          emergencies. The signs and symptoms of potential  ?                          delayed complications were discussed with the  ?                          patient. Return to normal activities tomorrow.  ?                          Written discharge  instructions were provided to the  ?                          patient. ?                          - Resume previous diet. ?                          - Continue present medications. ?                          - Await pathology results. ?                          - Will arrange f/u after pathology review - could  ?  need gastric emptying scan ?                          Review diet at f/u - ? needs lower CHO diet ?Gatha Mayer, MD ?11/17/2021 4:12:42 PM ?This report has been signed electronically. ?

## 2021-11-17 NOTE — Progress Notes (Signed)
To Pacu, VSS. Report to [email or web address] 

## 2021-11-21 ENCOUNTER — Telehealth: Payer: Self-pay | Admitting: *Deleted

## 2021-11-21 NOTE — Telephone Encounter (Signed)
?  Follow up Call- ? ? ?  11/17/2021  ?  2:59 PM  ?Call back number  ?Post procedure Call Back phone  # [phone]  ?Permission to leave phone message Yes  ?  ? ?Patient questions: ? ?Do you have a fever, pain , or abdominal swelling? No. ?Pain Score  0 * ? ?Have you tolerated food without any problems? Yes.   ? ?Have you been able to return to your normal activities? Yes.   ? ?Do you have any questions about your discharge instructions: ?Diet   No. ?Medications  No. ?Follow up visit  No. ? ?Do you have questions or concerns about your Care? No. ? ?Actions: ?* If pain score is 4 or above: ?No action needed, pain <4. ? ? ?

## 2021-11-29 ENCOUNTER — Other Ambulatory Visit: Payer: Self-pay | Admitting: Internal Medicine

## 2021-11-29 ENCOUNTER — Other Ambulatory Visit (HOSPITAL_COMMUNITY): Payer: Self-pay

## 2021-11-29 MED ORDER — SUCRALFATE 1 G PO TABS
1.0000 g | ORAL_TABLET | Freq: Three times a day (TID) | ORAL | 0 refills | Status: DC
  Filled 2021-11-29: qty 120, 30d supply, fill #0

## 2021-11-30 ENCOUNTER — Telehealth: Payer: Self-pay | Admitting: Physician Assistant

## 2021-11-30 ENCOUNTER — Other Ambulatory Visit (HOSPITAL_COMMUNITY): Payer: Self-pay

## 2021-11-30 NOTE — Telephone Encounter (Signed)
Left message for pt to call back  °

## 2021-11-30 NOTE — Telephone Encounter (Signed)
Patient called regarding medications. Patient wants to know if she should stop protonix medication. Please advise. ?

## 2021-12-01 NOTE — Telephone Encounter (Signed)
Pt questioned when to take Carafate and also if she needed to continue to take the protonix: Pt given detailed instructions when to take Carafate and notified to continue to take the protonix: Pt verbalized understanding with all questions answered.

## 2021-12-05 ENCOUNTER — Other Ambulatory Visit (HOSPITAL_COMMUNITY): Payer: Self-pay

## 2021-12-21 ENCOUNTER — Other Ambulatory Visit (HOSPITAL_COMMUNITY): Payer: Self-pay

## 2021-12-22 ENCOUNTER — Other Ambulatory Visit (HOSPITAL_COMMUNITY): Payer: Self-pay

## 2021-12-22 ENCOUNTER — Encounter: Payer: Self-pay | Admitting: Hematology and Oncology

## 2021-12-22 ENCOUNTER — Ambulatory Visit (INDEPENDENT_AMBULATORY_CARE_PROVIDER_SITE_OTHER): Payer: No Typology Code available for payment source | Admitting: Nurse Practitioner

## 2021-12-22 ENCOUNTER — Encounter: Payer: Self-pay | Admitting: Nurse Practitioner

## 2021-12-22 VITALS — BP 142/90 | HR 97 | Temp 98.4°F | Ht 62.0 in | Wt 144.0 lb

## 2021-12-22 DIAGNOSIS — R21 Rash and other nonspecific skin eruption: Secondary | ICD-10-CM | POA: Diagnosis not present

## 2021-12-22 DIAGNOSIS — E1169 Type 2 diabetes mellitus with other specified complication: Secondary | ICD-10-CM | POA: Diagnosis not present

## 2021-12-22 DIAGNOSIS — I10 Essential (primary) hypertension: Secondary | ICD-10-CM | POA: Diagnosis not present

## 2021-12-22 DIAGNOSIS — E781 Pure hyperglyceridemia: Secondary | ICD-10-CM

## 2021-12-22 MED ORDER — HYDROCORTISONE 1 % EX CREA
TOPICAL_CREAM | CUTANEOUS | 1 refills | Status: AC
  Filled 2021-12-22: qty 30, 15d supply, fill #0

## 2021-12-22 NOTE — Patient Instructions (Signed)
Hypertension, Adult High blood pressure (hypertension) is when the force of blood pumping through the arteries is too strong. The arteries are the blood vessels that carry blood from the heart throughout the body. Hypertension forces the heart to work harder to pump blood and may cause arteries to become narrow or stiff. Untreated or uncontrolled hypertension can lead to a heart attack, heart failure, a stroke, kidney disease, and other problems. A blood pressure reading consists of a higher number over a lower number. Ideally, your blood pressure should be below 120/80. The first ("top") number is called the systolic pressure. It is a measure of the pressure in your arteries as your heart beats. The second ("bottom") number is called the diastolic pressure. It is a measure of the pressure in your arteries as the heart relaxes. What are the causes? The exact cause of this condition is not known. There are some conditions that result in high blood pressure. What increases the risk? Certain factors may make you more likely to develop high blood pressure. Some of these risk factors are under your control, including: Smoking. Not getting enough exercise or physical activity. Being overweight. Having too much fat, sugar, calories, or salt (sodium) in your diet. Drinking too much alcohol. Other risk factors include: Having a personal history of heart disease, diabetes, high cholesterol, or kidney disease. Stress. Having a family history of high blood pressure and high cholesterol. Having obstructive sleep apnea. Age. The risk increases with age. What are the signs or symptoms? High blood pressure may not cause symptoms. Very high blood pressure (hypertensive crisis) may cause: Headache. Fast or irregular heartbeats (palpitations). Shortness of breath. Nosebleed. Nausea and vomiting. Vision changes. Severe chest pain, dizziness, and seizures. How is this diagnosed? This condition is diagnosed by  measuring your blood pressure while you are seated, with your arm resting on a flat surface, your legs uncrossed, and your feet flat on the floor. The cuff of the blood pressure monitor will be placed directly against the skin of your upper arm at the level of your heart. Blood pressure should be measured at least twice using the same arm. Certain conditions can cause a difference in blood pressure between your right and left arms. If you have a high blood pressure reading during one visit or you have normal blood pressure with other risk factors, you may be asked to: Return on a different day to have your blood pressure checked again. Monitor your blood pressure at home for 1 week or longer. If you are diagnosed with hypertension, you may have other blood or imaging tests to help your health care provider understand your overall risk for other conditions. How is this treated? This condition is treated by making healthy lifestyle changes, such as eating healthy foods, exercising more, and reducing your alcohol intake. You may be referred for counseling on a healthy diet and physical activity. Your health care provider may prescribe medicine if lifestyle changes are not enough to get your blood pressure under control and if: Your systolic blood pressure is above 130. Your diastolic blood pressure is above 80. Your personal target blood pressure may vary depending on your medical conditions, your age, and other factors. Follow these instructions at home: Eating and drinking  Eat a diet that is high in fiber and potassium, and low in sodium, added sugar, and fat. An example of this eating plan is called the DASH diet. DASH stands for Dietary Approaches to Stop Hypertension. To eat this way: Eat   plenty of fresh fruits and vegetables. Try to fill one half of your plate at each meal with fruits and vegetables. Eat whole grains, such as whole-wheat pasta, brown rice, or whole-grain bread. Fill about one  fourth of your plate with whole grains. Eat or drink low-fat dairy products, such as skim milk or low-fat yogurt. Avoid fatty cuts of meat, processed or cured meats, and poultry with skin. Fill about one fourth of your plate with lean proteins, such as fish, chicken without skin, beans, eggs, or tofu. Avoid pre-made and processed foods. These tend to be higher in sodium, added sugar, and fat. Reduce your daily sodium intake. Many people with hypertension should eat less than 1,500 mg of sodium a day. Do not drink alcohol if: Your health care provider tells you not to drink. You are pregnant, may be pregnant, or are planning to become pregnant. If you drink alcohol: Limit how much you have to: 0-1 drink a day for women. 0-2 drinks a day for men. Know how much alcohol is in your drink. In the [place] one drink equals one 12 oz bottle of beer (355 mL), one 5 oz glass of wine (148 mL), or one 1 oz glass of hard liquor (44 mL). Lifestyle  Work with your health care provider to maintain a healthy body weight or to lose weight. Ask what an ideal weight is for you. Get at least 30 minutes of exercise that causes your heart to beat faster (aerobic exercise) most days of the week. Activities may include walking, swimming, or biking. Include exercise to strengthen your muscles (resistance exercise), such as Pilates or lifting weights, as part of your weekly exercise routine. Try to do these types of exercises for 30 minutes at least 3 days a week. Do not use any products that contain nicotine or tobacco. These products include cigarettes, chewing tobacco, and vaping devices, such as e-cigarettes. If you need help quitting, ask your health care provider. Monitor your blood pressure at home as told by your health care provider. Keep all follow-up visits. This is important. Medicines Take over-the-counter and prescription medicines only as told by your health care provider. Follow directions carefully. Blood  pressure medicines must be taken as prescribed. Do not skip doses of blood pressure medicine. Doing this puts you at risk for problems and can make the medicine less effective. Ask your health care provider about side effects or reactions to medicines that you should watch for. Contact a health care provider if you: Think you are having a reaction to a medicine you are taking. Have headaches that keep coming back (recurring). Feel dizzy. Have swelling in your ankles. Have trouble with your vision. Get help right away if you: Develop a severe headache or confusion. Have unusual weakness or numbness. Feel faint. Have severe pain in your chest or abdomen. Vomit repeatedly. Have trouble breathing. These symptoms may be an emergency. Get help right away. Call 911. Do not wait to see if the symptoms will go away. Do not drive yourself to the hospital. Summary Hypertension is when the force of blood pumping through your arteries is too strong. If this condition is not controlled, it may put you at risk for serious complications. Your personal target blood pressure may vary depending on your medical conditions, your age, and other factors. For most people, a normal blood pressure is less than 120/80. Hypertension is treated with lifestyle changes, medicines, or a combination of both. Lifestyle changes include losing weight, eating a healthy,   low-sodium diet, exercising more, and limiting alcohol. This information is not intended to replace advice given to you by your health care provider. Make sure you discuss any questions you have with your health care provider. Document Revised: 05/10/2021 Document Reviewed: 05/10/2021 Elsevier Patient Education  2023 Elsevier Inc.  

## 2021-12-22 NOTE — Progress Notes (Signed)
I,Nichole Wilson,acting as a Education administrator for [employer], FNP.,have documented all relevant documentation on the behalf of Nichole Brine, FNP,as directed by  Nichole Brine, FNP while in the presence of Nichole Wilson, [place].  This visit occurred during the SARS-CoV-2 public health emergency.  Safety protocols were in place, including screening questions prior to the visit, additional usage of staff PPE, and extensive cleaning of exam room while observing appropriate contact time as indicated for disinfecting solutions.  Subjective:     Patient ID: Nichole Wilson , female    DOB: [birth year] , 61 y.o.   MRN: [id number]   Chief Complaint  Patient presents with   Rash    HPI  Patient presents today for treatment of rash. During the weekend noticed more irritation due to cleaning and using gloves. She has been having itching   Rash This is a new problem. The current episode started 1 to 4 weeks ago (started 1 week ago). The affected locations include the left hand. Past treatments include anti-itch cream.     Past Medical History:  Diagnosis Date   Abnormal LFTs 10/31/2018   ANA positive 08/06/2017   Anal fissure - posterior 11/26/2015   Anemia    Anterior scleritis of both eyes 05/12/2015   Followed by Dr Teola Bradley - Last OV 11/10, 11/18 - Update 11/18 - dx Acute toxic conjunctivitis > improved s/p prednisolone - Primary iridocyclitis (R-eye) - begin taper of prednisolone eye drops, much improved - Requested labs all resulted 11/16: ESR (21), ANA (neg), RPR (non-reactive, no FTA-Ab) >> faxed results back to Dr Sharen Counter    Blood transfusion without reported diagnosis    Breast cancer of upper-outer quadrant of right female breast (Nichole Wilson) 08/26/2015   Breast cyst, left 01/04/2012   cyst no longer apparent   Cholelithiasis with choledocholithiasis    Complication of anesthesia 2002   PT STATES DURING HER C-SECTION SHE WAS AWARE OF INCISION AND PAIN -THEN WAS QUICKLY "PUT OUT"   Controlled type 2  diabetes mellitus with diabetic nephropathy, without long-term current use of insulin ([place]) 05/12/2015   COVID-19 long hauler manifesting chronic concentration deficit 06/01/2020   Diabetes mellitus ([place]) 05/12/2015   Elevated alkaline phosphatase level 08/07/2018   Normal Alk Phos Isoenzyme   Essential hypertension 01/04/2016   Family history of adverse reaction to anesthesia    father became aggressive   Gallstone pancreatitis    Gastric mass GIST 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    Health care maintenance 03/24/2013   Hepatic steatosis    History of right breast cancer 04/28/2021   History of right mastectomy 04/28/2021   Hx of adenomatous polyp of colon 12/12/2018   Hypercholesterolemia with hypertriglyceridemia    Hypertension 01/04/2012   med d/c-last taken 12-07-11  -[facility] STOPPED HER B/P MEDICATION BECAUSE HER B/P TOO LOW AND PT STATES HER B/P'S HAVE BEEN OK SINCE.   Hypertriglyceridemia    Iron deficiency anemia 10/25/2018   Migraine without aura, without mention of intractable migraine without mention of status migrainosus    Overweight (BMI 25.0-29.9) 05/12/2015   Plantar fasciitis 09/05/2013   PONV (postoperative nausea and vomiting)    patient states that she did not get sick with mastectomy   Sciatica 05/17/2012   Patient with left leg radicular lumbar pain.  No concerning symptoms aside from pain. Plan: Trial of steroid dosepak and tramadol as needed for pain Discussed warning signs  or symptoms. Please see discharge instructions. Patient expresses understanding. F/u 2 weeks if not improved.     Telogen effluvium 06/01/2020     Family History  Problem Relation Age of Onset   Diabetes Mother    Diabetes Father    Diabetes Brother    Heart disease Paternal Uncle    Liver disease Brother    Colon cancer Neg Hx    Esophageal cancer Neg Hx    Rectal cancer Neg Hx    Stomach  cancer Neg Hx      Current Outpatient Medications:    hydrocortisone cream 1 %, Apply to affected area 2 times daily, Disp: 30 g, Rfl: 1   Ascorbic Acid (VITAMIN C) 1000 MG tablet, Take 1 tablet (1,000 mg total) by mouth daily., Disp: , Rfl:    aspirin EC 81 MG tablet, Take 81 mg by mouth daily. Swallow whole., Disp: , Rfl:    Cholecalciferol (VITAMIN D3) 10000 units capsule, Take 10,000 Units by mouth daily., Disp: , Rfl:    Cyanocobalamin (VITAMIN B12 PO), Take 1 capsule by mouth daily., Disp: , Rfl:    cycloSPORINE (RESTASIS) 0.05 % ophthalmic emulsion, INSTILL 1 DROP INTO AFFECTED EYE EVERY 12 HOURS, Disp: 180 each, Rfl: 3   icosapent Ethyl (VASCEPA) 1 g capsule, TAKE 2 CAPSULES BY MOUTH 2 TIMES DAILY., Disp: 360 capsule, Rfl: 2   metFORMIN (GLUCOPHAGE) 500 MG tablet, TAKE 1 TABLET BY MOUTH EVERY MORNING WITH BREAKFAST, Disp: 30 tablet, Rfl: 2   Multiple Vitamin (MULTIVITAMIN) tablet, Take 1 tablet by mouth daily., Disp: , Rfl:    pantoprazole (PROTONIX) 40 MG tablet, Take 1 tablet (40 mg total) by mouth daily before breakfast., Disp: 90 tablet, Rfl: 0   sucralfate (CARAFATE) 1 g tablet, Take 1 tablet by mouth 4 times daily -  with meals and at bedtime., Disp: 120 tablet, Rfl: 0   vitamin E 1000 UNIT capsule, Take 1,000 Units by mouth daily., Disp: , Rfl:    Allergies  Allergen Reactions   Lisinopril Swelling   Other     General anesthesia-severe vomiting     Review of Systems  Constitutional: Negative.   Respiratory: Negative.    Cardiovascular: Negative.   Gastrointestinal: Negative.   Skin:  Positive for rash.  Neurological: Negative.   Psychiatric/Behavioral: Negative.       Today's Vitals   12/22/21 1240  BP: (!) 142/90  Pulse: 97  Temp: 98.4 F (36.9 C)  TempSrc: Oral  Weight: 144 lb (65.3 kg)  Height: 5' 2"  (1.575 m)   Body mass index is 26.34 kg/m.  Wt Readings from Last 3 Encounters:  12/22/21 144 lb (65.3 kg)  11/17/21 143 lb (64.9 kg)  10/06/21 143 lb  12.8 oz (65.2 kg)    Objective:  Physical Exam Vitals reviewed.  Constitutional:      General: She is not in acute distress.    Appearance: Normal appearance. She is normal weight.  Cardiovascular:     Rate and Rhythm: Normal rate and regular rhythm.     Pulses: Normal pulses.     Heart sounds: Normal heart sounds. No murmur heard. Pulmonary:     Effort: Pulmonary effort is normal. No respiratory distress.     Breath sounds: Normal breath sounds. No wheezing.  Musculoskeletal:     Cervical back: Normal range of motion and neck supple.  Skin:    General: Skin is warm and dry.     Capillary Refill: Capillary refill takes less than 2  seconds.     Findings: Rash (slight scaly rash to left arm and shoulder) present.  Neurological:     General: No focal deficit present.     Mental Status: She is alert and oriented to person, place, and time.     Cranial Nerves: No cranial nerve deficit.     Motor: No weakness.  Psychiatric:        Mood and Affect: Mood normal.        Behavior: Behavior normal.        Thought Content: Thought content normal.        Judgment: Judgment normal.         Assessment And Plan:     1. Essential hypertension Comments: Blood pressure is slightly elevated, she is advised to take her blood pressure medications regularly and limit intake of salt. - BMP8+EGFR  2. Type 2 diabetes mellitus with other specified complication, without long-term current use of insulin (HCC) Comments: Stable, continue current medications - Hemoglobin A1c  3. Hypertriglyceridemia Comments: Continues to be elevated, she is encouraged to limit intake of breads and sweets and to continue statin. Continue f/u with Cardiology - Lipid panel  4. Rash and nonspecific skin eruption - hydrocortisone cream 1 %; Apply to affected area 2 times daily  Dispense: 30 g; Refill: 1     Patient was given opportunity to ask questions. Patient verbalized understanding of the plan and was able to  repeat key elements of the plan. All questions were answered to their satisfaction.  Nichole Brine, FNP   I, Nichole Brine, FNP, have reviewed all documentation for this visit. The documentation on 12/22/21 for the exam, diagnosis, procedures, and orders are all accurate and complete.   IF YOU HAVE BEEN REFERRED TO A SPECIALIST, IT MAY TAKE 1-2 WEEKS TO SCHEDULE/PROCESS THE REFERRAL. IF YOU HAVE NOT HEARD FROM US/SPECIALIST IN TWO WEEKS, PLEASE GIVE [place] A CALL AT [phone].   THE PATIENT IS ENCOURAGED TO PRACTICE SOCIAL DISTANCING DUE TO THE COVID-19 PANDEMIC.

## 2021-12-23 LAB — LIPID PANEL
Chol/HDL Ratio: 4.8 ratio — ABNORMAL HIGH (ref 0.0–4.4)
Cholesterol, Total: 271 mg/dL — ABNORMAL HIGH (ref 100–199)
HDL: 56 mg/dL (ref >39)
LDL Chol Calc (NIH): 117 mg/dL — ABNORMAL HIGH (ref 0–99)
Triglycerides: 558 mg/dL (ref 0–149)
VLDL Cholesterol Cal: 98 mg/dL — ABNORMAL HIGH (ref 5–40)

## 2021-12-24 LAB — BMP8+EGFR
BUN/Creatinine Ratio: 17 (ref 9–23)
BUN: 9 mg/dL (ref 6–24)
CO2: 20 mmol/L (ref 20–29)
Calcium: 9.7 mg/dL (ref 8.7–10.2)
Chloride: 100 mmol/L (ref 96–106)
Creatinine, Ser: 0.54 mg/dL — ABNORMAL LOW (ref 0.57–1.00)
Glucose: 173 mg/dL — ABNORMAL HIGH (ref 70–99)
Potassium: 4.3 mmol/L (ref 3.5–5.2)
Sodium: 140 mmol/L (ref 134–144)
eGFR: 106 mL/min/{1.73_m2} (ref >59)

## 2021-12-24 LAB — HEMOGLOBIN A1C
Est. average glucose Bld gHb Est-mCnc: 128 mg/dL
Hgb A1c MFr Bld: 6.1 % — ABNORMAL HIGH (ref 4.8–5.6)

## 2021-12-27 ENCOUNTER — Other Ambulatory Visit (HOSPITAL_COMMUNITY): Payer: Self-pay

## 2021-12-27 ENCOUNTER — Other Ambulatory Visit: Payer: Self-pay | Admitting: Nurse Practitioner

## 2021-12-27 DIAGNOSIS — R21 Rash and other nonspecific skin eruption: Secondary | ICD-10-CM

## 2021-12-28 ENCOUNTER — Other Ambulatory Visit (HOSPITAL_COMMUNITY): Payer: Self-pay

## 2021-12-30 ENCOUNTER — Other Ambulatory Visit (HOSPITAL_COMMUNITY): Payer: Self-pay

## 2022-01-03 ENCOUNTER — Other Ambulatory Visit (HOSPITAL_COMMUNITY): Payer: Self-pay

## 2022-01-03 ENCOUNTER — Other Ambulatory Visit: Payer: Self-pay | Admitting: Hematology and Oncology

## 2022-01-03 MED ORDER — METHYLPREDNISOLONE 4 MG PO TBPK
ORAL_TABLET | ORAL | 0 refills | Status: DC
  Filled 2022-01-03: qty 21, 6d supply, fill #0

## 2022-01-04 ENCOUNTER — Encounter: Payer: Self-pay | Admitting: Internal Medicine

## 2022-01-04 ENCOUNTER — Telehealth: Payer: No Typology Code available for payment source | Admitting: Nurse Practitioner

## 2022-01-04 ENCOUNTER — Other Ambulatory Visit (HOSPITAL_COMMUNITY): Payer: Self-pay

## 2022-01-04 ENCOUNTER — Other Ambulatory Visit: Payer: Self-pay

## 2022-01-04 ENCOUNTER — Ambulatory Visit (INDEPENDENT_AMBULATORY_CARE_PROVIDER_SITE_OTHER): Payer: No Typology Code available for payment source | Admitting: Internal Medicine

## 2022-01-04 VITALS — BP 132/86 | HR 94 | Ht 62.0 in | Wt 145.0 lb

## 2022-01-04 DIAGNOSIS — R1013 Epigastric pain: Secondary | ICD-10-CM | POA: Diagnosis not present

## 2022-01-04 DIAGNOSIS — R21 Rash and other nonspecific skin eruption: Secondary | ICD-10-CM

## 2022-01-04 DIAGNOSIS — F439 Reaction to severe stress, unspecified: Secondary | ICD-10-CM

## 2022-01-04 MED ORDER — SUCRALFATE 1 G PO TABS
ORAL_TABLET | ORAL | 3 refills | Status: DC
  Filled 2022-01-04: qty 120, 30d supply, fill #0
  Filled 2022-06-27: qty 120, 30d supply, fill #1

## 2022-01-04 NOTE — Progress Notes (Signed)
Nichole Wilson 61 y.o. 10-18-1960 [id number]  Assessment & Plan:   Encounter Diagnoses  Name Primary?   Dyspepsia Yes   Situational stress     Improved.  She will see if she can come off pantoprazole there is no chronic need for that.  She can use that and the sucralfate as needed.  Follow-up as needed.  CC: Nichole Brine, FNP  Subjective:   Chief Complaint: Follow-up of dyspepsia  HPI 61 yo woman w/ hx GIST resection, adenomatous colon polyp, GERD/dyspepsia, DM here for f/u after recent dyspepsia flare w/ OV 09/2021 Nichole Wilson) and EGD \ EGD 11/29/21 - Gastritis. Biopsied. Diagnosis Surgical [P], gastric antrum - GASTRIC OXYNTIC AND OXYNTOMUCINOUS MUCOSA WITH NO SPECIFIC PATHOLOGIC DIAGNOSIS. - NEGATIVE FOR AN INFLAMMATORY PATTERN PREDICTIVE OF HELICOBACTER PYLORI INFECTION. - NEGATIVE FOR INTESTINAL METAPLASIA AND MALIGNANCY.  - Gastroesophageal flap valve classified as Hill Grade III (minimal fold, loose to endoscope, hiatal hernia likely). - Small hiatal hernia. - The examination was otherwise normal.  After that I prescribed Carafate.  She took a months worth of that 4 times a day and reports resolution of symptoms.  She had also been under some situational stress with her family and that situation has improved.  In May she developed a rash tried topical steroids and is now going to go on a Medrol Dosepak 6 days  Allergies  Allergen Reactions   Lisinopril Swelling   Other     General anesthesia-severe vomiting   Current Meds  Medication Sig   Ascorbic Acid (VITAMIN C) 1000 MG tablet Take 1 tablet (1,000 mg total) by mouth daily.   aspirin EC 81 MG tablet Take 81 mg by mouth daily. Swallow whole.   Cholecalciferol (VITAMIN D3) 10000 units capsule Take 10,000 Units by mouth daily.   Cyanocobalamin (VITAMIN B12 PO) Take 1 capsule by mouth daily.   cycloSPORINE (RESTASIS) 0.05 % ophthalmic emulsion INSTILL 1 DROP INTO AFFECTED EYE EVERY 12 HOURS    hydrocortisone cream 1 % Apply to affected area 2 times daily   icosapent Ethyl (VASCEPA) 1 g capsule TAKE 2 CAPSULES BY MOUTH 2 TIMES DAILY.   metFORMIN (GLUCOPHAGE) 500 MG tablet TAKE 1 TABLET BY MOUTH EVERY MORNING WITH BREAKFAST   methylPREDNISolone (MEDROL DOSEPAK) 4 MG TBPK tablet Take as directed in package for 6 days.   Multiple Vitamin (MULTIVITAMIN) tablet Take 1 tablet by mouth daily.   pantoprazole (PROTONIX) 40 MG tablet Take 1 tablet (40 mg total) by mouth daily before breakfast.   vitamin E 1000 UNIT capsule Take 1,000 Units by mouth daily.   sucralfate (CARAFATE) 1 g tablet Take 1 tablet by mouth 4 times daily -  with meals and at bedtime.  Medrol Dosepak x6 days starting today   Past Medical History:  Diagnosis Date   Abnormal LFTs 10/31/2018   ANA positive 08/06/2017   Anal fissure - posterior 11/26/2015   Anemia    Anterior scleritis of both eyes 05/12/2015   Followed by Dr Teola Bradley - Last OV 11/10, 11/18 - Update 11/18 - dx Acute toxic conjunctivitis > improved s/p prednisolone - Primary iridocyclitis (R-eye) - begin taper of prednisolone eye drops, much improved - Requested labs all resulted 11/16: ESR (21), ANA (neg), RPR (non-reactive, no FTA-Ab) >> faxed results back to Dr Sharen Counter    Blood transfusion without reported diagnosis    Breast cancer of upper-outer quadrant of right female breast ([place]) 08/26/2015   Breast cyst, left 01/04/2012   cyst no longer  apparent   Cholelithiasis with choledocholithiasis    Complication of anesthesia 2002   PT STATES DURING HER C-SECTION SHE WAS AWARE OF INCISION AND PAIN -THEN WAS QUICKLY "PUT OUT"   Controlled type 2 diabetes mellitus with diabetic nephropathy, without long-term current use of insulin ([place]) 05/12/2015   COVID-19 long hauler manifesting chronic concentration deficit 06/01/2020   Diabetes mellitus ([place]) 05/12/2015   Elevated alkaline phosphatase level 08/07/2018   Normal Alk Phos Isoenzyme   Essential  hypertension 01/04/2016   Family history of adverse reaction to anesthesia    father became aggressive   Gallstone pancreatitis    Gastric mass GIST 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    Health care maintenance 03/24/2013   Hepatic steatosis    History of right breast cancer 04/28/2021   History of right mastectomy 04/28/2021   Hx of adenomatous polyp of colon 12/12/2018   Hypercholesterolemia with hypertriglyceridemia    Hypertension 01/04/2012   med d/c-last taken 12-07-11  -[facility] STOPPED HER B/P MEDICATION BECAUSE HER B/P TOO LOW AND PT STATES HER B/P'S HAVE BEEN OK SINCE.   Hypertriglyceridemia    Iron deficiency anemia 10/25/2018   Migraine without aura, without mention of intractable migraine without mention of status migrainosus    Overweight (BMI 25.0-29.9) 05/12/2015   Plantar fasciitis 09/05/2013   PONV (postoperative nausea and vomiting)    patient states that she did not get sick with mastectomy   Sciatica 05/17/2012   Patient with left leg radicular lumbar pain.  No concerning symptoms aside from pain. Plan: Trial of steroid dosepak and tramadol as needed for pain Discussed warning signs or symptoms. Please see discharge instructions. Patient expresses understanding. F/u 2 weeks if not improved.     Telogen effluvium 06/01/2020   Past Surgical History:  Procedure Laterality Date   CESAREAN SECTION     2002   CHOLECYSTECTOMY  03/25/2012   Procedure: LAPAROSCOPIC CHOLECYSTECTOMY WITH INTRAOPERATIVE CHOLANGIOGRAM;  Surgeon: Haywood Lasso, MD;  Location: [facility];  Service: General;  Laterality: N/A;  Laparoscopic Cholecystectomy with Intraoperative Cholangiogram   COLONOSCOPY     ERCP  12/05/2011   Procedure: ENDOSCOPIC RETROGRADE CHOLANGIOPANCREATOGRAPHY (ERCP);  Surgeon: Inda Castle, MD;  Location: [facility];  Service: Endoscopy;  Laterality: N/A;   ERCP  12/08/2011   Procedure:  ENDOSCOPIC RETROGRADE CHOLANGIOPANCREATOGRAPHY (ERCP);  Surgeon: Inda Castle, MD;  Location: [facility];  Service: Gastroenterology;  Laterality: N/A;   ESOPHAGOGASTRODUODENOSCOPY (EGD) WITH PROPOFOL N/A 09/28/2015   Procedure: ESOPHAGOGASTRODUODENOSCOPY (EGD) WITH PROPOFOL;  Surgeon: Clarene Essex, MD;  Location: [facility];  Service: Endoscopy;  Laterality: N/A;  H AND P IN DRAWER   EUS N/A 10/13/2015   Procedure: UPPER ENDOSCOPIC ULTRASOUND (EUS) RADIAL;  Surgeon: Arta Silence, MD;  Location: [facility];  Service: Endoscopy;  Laterality: N/A;   LAPAROSCOPIC PARTIAL GASTRECTOMY N/A 01/11/2016   Procedure: LAPAROSCOPIC PARTIAL GASTRECTOMY;  Surgeon: [name], MD;  Location: [place] [facility];  Service: General;  Laterality: N/A;   MASTECTOMY Right 09/2015   MASTECTOMY W/ SENTINEL NODE BIOPSY Right 09/15/2015   Procedure: RIGHT MASTECTOMY WITH SENTINEL LYMPH NODE BIOPSY;  Surgeon: Rolm Bookbinder, MD;  Location: [place];  Service: General;  Laterality: Right;   UPPER GI ENDOSCOPY N/A 01/11/2016   Procedure: UPPER GI ENDOSCOPY;  Surgeon: [name], MD;  Location: [place];  Service: General;  Laterality: N/A;   Social History   Social History Narrative  Married, husband is a Dealer   2 young adult daughters   - 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   Review of Systems As above  Objective:   Physical Exam BP 132/86   Pulse 94   Ht 5' 2" (1.575 m)   Wt 145 lb (65.8 kg)   LMP 11/29/2011   BMI 26.52 kg/m

## 2022-01-04 NOTE — Patient Instructions (Signed)
Glad your better.  We have sent the following medications to your pharmacy for you to pick up at your convenience: Generic carafate to use as needed  You may try to come off the pantoprazole and use it as needed.   I appreciate the opportunity to care for you. Silvano Rusk, MD, [facility]

## 2022-01-09 ENCOUNTER — Other Ambulatory Visit: Payer: Self-pay | Admitting: Nurse Practitioner

## 2022-01-09 ENCOUNTER — Other Ambulatory Visit (HOSPITAL_COMMUNITY): Payer: Self-pay

## 2022-01-09 DIAGNOSIS — E1169 Type 2 diabetes mellitus with other specified complication: Secondary | ICD-10-CM

## 2022-01-09 MED ORDER — METFORMIN HCL 500 MG PO TABS
ORAL_TABLET | Freq: Every day | ORAL | 2 refills | Status: DC
  Filled 2022-01-09: qty 30, 30d supply, fill #0
  Filled 2022-02-07: qty 30, 30d supply, fill #1
  Filled 2022-03-07: qty 30, 30d supply, fill #2

## 2022-02-07 ENCOUNTER — Other Ambulatory Visit (HOSPITAL_COMMUNITY): Payer: Self-pay

## 2022-02-17 ENCOUNTER — Other Ambulatory Visit: Payer: Self-pay | Admitting: Nurse Practitioner

## 2022-02-17 ENCOUNTER — Other Ambulatory Visit (HOSPITAL_COMMUNITY): Payer: Self-pay

## 2022-02-17 MED ORDER — SIMVASTATIN 20 MG PO TABS
20.0000 mg | ORAL_TABLET | Freq: Every evening | ORAL | 2 refills | Status: DC
  Filled 2022-02-17 – 2022-03-07 (×2): qty 30, 30d supply, fill #0

## 2022-02-27 ENCOUNTER — Other Ambulatory Visit (HOSPITAL_COMMUNITY): Payer: Self-pay

## 2022-03-07 ENCOUNTER — Other Ambulatory Visit (HOSPITAL_COMMUNITY): Payer: Self-pay

## 2022-03-08 ENCOUNTER — Other Ambulatory Visit (HOSPITAL_COMMUNITY): Payer: Self-pay

## 2022-03-13 ENCOUNTER — Encounter: Payer: Self-pay | Admitting: Hematology and Oncology

## 2022-03-15 ENCOUNTER — Encounter: Payer: Self-pay | Admitting: Nurse Practitioner

## 2022-03-15 ENCOUNTER — Ambulatory Visit (INDEPENDENT_AMBULATORY_CARE_PROVIDER_SITE_OTHER): Payer: No Typology Code available for payment source | Admitting: Nurse Practitioner

## 2022-03-15 ENCOUNTER — Other Ambulatory Visit (HOSPITAL_COMMUNITY): Payer: Self-pay

## 2022-03-15 VITALS — BP 128/80 | HR 80 | Temp 98.0°F | Ht 62.0 in | Wt 144.0 lb

## 2022-03-15 DIAGNOSIS — I10 Essential (primary) hypertension: Secondary | ICD-10-CM | POA: Diagnosis not present

## 2022-03-15 DIAGNOSIS — E781 Pure hyperglyceridemia: Secondary | ICD-10-CM

## 2022-03-15 DIAGNOSIS — Z1231 Encounter for screening mammogram for malignant neoplasm of breast: Secondary | ICD-10-CM

## 2022-03-15 DIAGNOSIS — E782 Mixed hyperlipidemia: Secondary | ICD-10-CM | POA: Diagnosis not present

## 2022-03-15 MED ORDER — HYDROCHLOROTHIAZIDE 12.5 MG PO TABS
12.5000 mg | ORAL_TABLET | Freq: Every day | ORAL | 1 refills | Status: DC
  Filled 2022-03-15 – 2022-06-12 (×2): qty 90, 90d supply, fill #0

## 2022-03-15 NOTE — Progress Notes (Signed)
I,[name],acting as a Education administrator for [employer], FNP.,have documented all relevant documentation on the behalf of [name], FNP,as directed by  [name], FNP while in the presence of [name], [place].    Subjective:     Patient ID: Nichole Wilson , female    DOB: 1961-05-26 , 61 y.o.   MRN: [id number]   Chief Complaint  Patient presents with   Hypertension    HPI  Patient presents today for elevated blood pressure readings at home. Reports her blood pressure readings have been up to 140's. She is avoiding salt. She has also cut back on cheese. She has been drinking at least 2 bottles of water a day. She drank cucumber water with lime. She is trying to do just liquids on her diet with protein. Reports when she had problems with her gallbladder her blood pressure was elevated. She also had a fibroid removed, now has delay on emptying her stomach.   BP Readings from Last 3 Encounters: 03/15/22 : 128/80 01/04/22 : 132/86 12/22/21 : (!) 142/90       Past Medical History:  Diagnosis Date   Abnormal LFTs 10/31/2018   ANA positive 08/06/2017   Anal fissure - posterior 11/26/2015   Anemia    Anterior scleritis of both eyes 05/12/2015   Followed by Dr Teola Bradley - Last OV 11/10, 11/18 - Update 11/18 - dx Acute toxic conjunctivitis > improved s/p prednisolone - Primary iridocyclitis (R-eye) - begin taper of prednisolone eye drops, much improved - Requested labs all resulted 11/16: ESR (21), ANA (neg), RPR (non-reactive, no FTA-Ab) >> faxed results back to Dr Sharen Counter    Blood transfusion without reported diagnosis    Breast cancer of upper-outer quadrant of right female breast ([place]) 08/26/2015   Breast cyst, left 01/04/2012   cyst no longer apparent   Cholelithiasis with choledocholithiasis    Complication of anesthesia 2002   PT STATES DURING HER C-SECTION SHE WAS AWARE OF INCISION AND PAIN -THEN WAS QUICKLY "PUT OUT"   Controlled type 2 diabetes mellitus with diabetic  nephropathy, without long-term current use of insulin ([place]) 05/12/2015   COVID-19 long hauler manifesting chronic concentration deficit 06/01/2020   Diabetes mellitus ([place]) 05/12/2015   Elevated alkaline phosphatase level 08/07/2018   Normal Alk Phos Isoenzyme   Essential hypertension 01/04/2016   Family history of adverse reaction to anesthesia    father became aggressive   Gallstone pancreatitis    Gastric mass GIST 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    Health care maintenance 03/24/2013   Hepatic steatosis    History of right breast cancer 04/28/2021   History of right mastectomy 04/28/2021   Hx of adenomatous polyp of colon 12/12/2018   Hypercholesterolemia with hypertriglyceridemia    Hypertension 01/04/2012   med d/c-last taken 12-07-11  -[facility] STOPPED HER B/P MEDICATION BECAUSE HER B/P TOO LOW AND PT STATES HER B/P'S HAVE BEEN OK SINCE.   Hypertriglyceridemia    Iron deficiency anemia 10/25/2018   Migraine without aura, without mention of intractable migraine without mention of status migrainosus    Overweight (BMI 25.0-29.9) 05/12/2015   Plantar fasciitis 09/05/2013   PONV (postoperative nausea and vomiting)    patient states that she did not get sick with mastectomy   Sciatica 05/17/2012   Patient with left leg radicular lumbar pain.  No concerning symptoms aside from pain. Plan: Trial of steroid dosepak and  tramadol as needed for pain Discussed warning signs or symptoms. Please see discharge instructions. Patient expresses understanding. F/u 2 weeks if not improved.     Telogen effluvium 06/01/2020     Family History  Problem Relation Age of Onset   Diabetes Mother    Diabetes Father    Diabetes Brother    Heart disease Paternal Uncle    Liver disease Brother    Colon cancer Neg Hx    Esophageal cancer Neg Hx    Rectal cancer Neg Hx    Stomach cancer Neg Hx      Current  Outpatient Medications:    hydrochlorothiazide (HYDRODIURIL) 12.5 MG tablet, Take 1 tablet (12.5 mg total) by mouth daily., Disp: 90 tablet, Rfl: 1   Ascorbic Acid (VITAMIN C) 1000 MG tablet, Take 1 tablet (1,000 mg total) by mouth daily., Disp: , Rfl:    aspirin EC 81 MG tablet, Take 81 mg by mouth daily. Swallow whole., Disp: , Rfl:    Cholecalciferol (VITAMIN D3) 10000 units capsule, Take 10,000 Units by mouth daily., Disp: , Rfl:    Cyanocobalamin (VITAMIN B12 PO), Take 1 capsule by mouth daily., Disp: , Rfl:    cycloSPORINE (RESTASIS) 0.05 % ophthalmic emulsion, INSTILL 1 DROP INTO AFFECTED EYE EVERY 12 HOURS, Disp: 180 each, Rfl: 3   hydrocortisone cream 1 %, Apply to affected area 2 times daily, Disp: 30 g, Rfl: 1   icosapent Ethyl (VASCEPA) 1 g capsule, TAKE 2 CAPSULES BY MOUTH 2 TIMES DAILY., Disp: 360 capsule, Rfl: 2   metFORMIN (GLUCOPHAGE) 500 MG tablet, TAKE 1 TABLET BY MOUTH EVERY MORNING WITH BREAKFAST, Disp: 30 tablet, Rfl: 2   methylPREDNISolone (MEDROL DOSEPAK) 4 MG TBPK tablet, Take as directed in package for 6 days., Disp: 21 tablet, Rfl: 0   Multiple Vitamin (MULTIVITAMIN) tablet, Take 1 tablet by mouth daily., Disp: , Rfl:    pantoprazole (PROTONIX) 40 MG tablet, Take 1 tablet (40 mg total) by mouth daily before breakfast., Disp: 90 tablet, Rfl: 0   simvastatin (ZOCOR) 20 MG tablet, Take 1 tablet by mouth every evening., Disp: 30 tablet, Rfl: 2   sucralfate (CARAFATE) 1 g tablet, Take 1 tablet by mouth 3 times daily (before meals) and at bedtime as needed, Disp: 120 tablet, Rfl: 3   vitamin E 1000 UNIT capsule, Take 1,000 Units by mouth daily., Disp: , Rfl:    Allergies  Allergen Reactions   Lisinopril Swelling   Other     General anesthesia-severe vomiting     Review of Systems  Constitutional: Negative.   Respiratory: Negative.    Cardiovascular: Negative.   Gastrointestinal: Negative.   Skin:  Positive for rash.  Neurological: Negative.    Psychiatric/Behavioral: Negative.       Today's Vitals   03/15/22 0848  BP: 128/80  Pulse: 80  Temp: 98 F (36.7 C)  TempSrc: Oral  Weight: 144 lb (65.3 kg)  Height: 5' 2"  (1.575 m)   Body mass index is 26.34 kg/m.   Objective:  Physical Exam Vitals reviewed.  Constitutional:      General: She is not in acute distress.    Appearance: Normal appearance. She is normal weight.  Cardiovascular:     Rate and Rhythm: Normal rate and regular rhythm.     Pulses: Normal pulses.     Heart sounds: Normal heart sounds. No murmur heard. Pulmonary:     Effort: Pulmonary effort is normal. No respiratory distress.     Breath sounds: Normal breath  sounds. No wheezing.  Musculoskeletal:     Cervical back: Normal range of motion and neck supple.  Skin:    General: Skin is warm and dry.     Capillary Refill: Capillary refill takes less than 2 seconds.     Findings: No rash.  Neurological:     General: No focal deficit present.     Mental Status: She is alert and oriented to person, place, and time.     Cranial Nerves: No cranial nerve deficit.     Motor: No weakness.  Psychiatric:        Mood and Affect: Mood normal.        Behavior: Behavior normal.        Thought Content: Thought content normal.        Judgment: Judgment normal.         Assessment And Plan:     1. Essential hypertension Comments: Blood pressure is controlled today, however will start HCTZ. Advised to make sure she is staying hydrated and eating foods high in potassium - hydrochlorothiazide (HYDRODIURIL) 12.5 MG tablet; Take 1 tablet (12.5 mg total) by mouth daily.  Dispense: 90 tablet; Refill: 1  2. Hypertriglyceridemia Comments: [place] from [place], will recheck levels. Will also refer to lipid clinic - [facility]B [facility]  3. Mixed hyperlipidemia - Lipid panel - CMP14+EGFR - AMB Referral to [facility]  4. Encounter for  screening mammogram for breast cancer Pt instructed on Self Breast Exam.According to ACOG guidelines Women aged 21 and older are recommended to get an annual mammogram. Form completed and given to patient contact the [facility] for appointment scheduing.  Pt encouraged to get annual mammogram - MM Digital Screening; Future     Patient was given opportunity to ask questions. Patient verbalized understanding of the plan and was able to repeat key elements of the plan. All questions were answered to their satisfaction.  [name], FNP   I, [name], FNP, have reviewed all documentation for this visit. The documentation on 03/15/22 for the exam, diagnosis, procedures, and orders are all accurate and complete.   IF YOU HAVE BEEN REFERRED TO A SPECIALIST, IT MAY TAKE 1-2 WEEKS TO SCHEDULE/PROCESS THE REFERRAL. IF YOU HAVE NOT HEARD FROM US/SPECIALIST IN TWO WEEKS, PLEASE GIVE [place] A CALL AT [phone].   THE PATIENT IS ENCOURAGED TO PRACTICE SOCIAL DISTANCING DUE TO THE COVID-19 PANDEMIC.

## 2022-03-15 NOTE — Patient Instructions (Signed)
Preventing Hypertension Hypertension, also called high blood pressure, is when the force of blood pumping through the arteries is too strong. Arteries are blood vessels that carry blood from the heart throughout the body. Often, hypertension does not cause symptoms until blood pressure is very high. It is important to have your blood pressure checked regularly. Diet and lifestyle changes can help you prevent hypertension, and they may make you feel better overall and improve your quality of life. If you already have hypertension, you may control it with diet and lifestyle changes, as well as with medicine. How can this condition affect me? Over time, hypertension can damage the arteries and decrease blood flow to important parts of the body, including the brain, heart, and kidneys. By keeping your blood pressure in a healthy range, you can help prevent complications like heart attack, heart failure, stroke, kidney failure, and vascular dementia. What can increase my risk? An unhealthy diet and a lack of physical activity can make you more likely to develop high blood pressure. Some other risk factors include: Age. The risk increases with age. Having family members who have had high blood pressure. Having certain health conditions, such as thyroid problems. Being overweight or obese. Drinking too much alcohol or caffeine. Having too much fat, sugar, calories, or salt (sodium) in your diet. Smoking or using illegal drugs. Taking certain medicines, such as antidepressants, decongestants, birth control pills, and NSAIDs, such as ibuprofen. What actions can I take to prevent or manage this condition? Work with your health care provider to make a hypertension prevention plan that works for you. You may be referred for counseling on a healthy diet and physical activity. Follow your plan and keep all follow-up visits. Diet changes Maintain a healthy diet. This includes: Eating less salt (sodium). Ask your  health care provider how much sodium is safe for you to have. The general recommendation is to have less than 1 tsp (2,300 mg) of sodium a day. Do not add salt to your food. Choose low-sodium options when grocery shopping and eating out. Limiting fats in your diet. You can do this by eating low-fat or fat-free dairy products and by eating less red meat. Eating more fruits, vegetables, and whole grains. Make a goal to eat: 1-2 cups of fresh fruits and vegetables each day. 3-4 servings of whole grains each day. Avoiding foods and beverages that have added sugars. Eating fish that contain healthy fats (omega-3 fatty acids), such as mackerel or salmon. If you need help putting together a healthy eating plan, try the DASH diet. This diet is high in fruits, vegetables, and whole grains. It is low in sodium, red meat, and added sugars. DASH stands for Dietary Approaches to Stop Hypertension. Lifestyle changes  Lose weight if you are overweight. Losing just 3-5% of your body weight can help prevent or control hypertension. For example, if your present weight is 200 lb (91 kg), a loss of 3-5% of your weight means losing 6-10 lb (2.7-4.5 kg). Ask your health care provider to help you with a diet and exercise plan to safely lose weight. Get enough exercise. Do at least 150 minutes of moderate-intensity exercise each week. You could do this in short exercise sessions several times a day, or you could do longer exercise sessions a few times a week. For example, you could take a brisk 10-minute walk or bike ride, 3 times a day, for 5 days a week. Find ways to reduce stress, such as exercising, meditating, listening to   music, or taking a yoga class. If you need help reducing stress, ask your health care provider. Do not use any products that contain nicotine or tobacco. These products include cigarettes, chewing tobacco, and vaping devices, such as e-cigarettes. Chemicals in tobacco and nicotine products raise your  blood pressure each time you use them. If you need help quitting, ask your health care provider. Learn how to check your blood pressure at home. Make sure that you know your personal target blood pressure, as told by your health care provider. Try to sleep 7-9 hours per night. Alcohol use Do not drink alcohol if: Your health care provider tells you not to drink. You are pregnant, may be pregnant, or are planning to become pregnant. If you drink alcohol: Limit how much you have to: 0-1 drink a day for women. 0-2 drinks a day for men. Know how much alcohol is in your drink. In the [place] one drink equals one 12 oz bottle of beer (355 mL), one 5 oz glass of wine (148 mL), or one 1 oz glass of hard liquor (44 mL). Medicines In addition to diet and lifestyle changes, your health care provider may recommend medicines to help lower your blood pressure. In general: You may need to try a few different medicines to find what works best for you. You may need to take more than one medicine. Take over-the-counter and prescription medicines only as told by your health care provider. Questions to ask your health care provider What is my blood pressure goal? How can I lower my risk for high blood pressure? How should I monitor my blood pressure at home? Where to find support Your health care provider can help you prevent hypertension and help you keep your blood pressure at a healthy level. Your local hospital or your community may also provide support services and prevention programs. The American Heart Association offers an online support network at supportnetwork.heart.org Where to find more information Learn more about hypertension from: [place], Lung, and [place]: https://wilson-eaton.com/ Centers for Disease Control and Prevention: http://www.wolf.info/ American Academy of Family Physicians: familydoctor.org Learn more about the DASH diet from: [place], Lung, and [place]:  https://wilson-eaton.com/ Contact a health care provider if: You think you are having a reaction to medicines you have taken. You have recurrent headaches or feel dizzy. You have swelling in your ankles. You have trouble with your vision. Get help right away if: You have sudden, severe chest, back, or abdominal pain or discomfort. You have shortness of breath. You have a sudden, severe headache. These symptoms may be an emergency. Get help right away. Call 911. Do not wait to see if the symptoms will go away. Do not drive yourself to the hospital. Summary Hypertension often does not cause any symptoms until blood pressure is very high. It is important to get your blood pressure checked regularly. Diet and lifestyle changes are important steps in preventing hypertension. By keeping your blood pressure in a healthy range, you may prevent complications like heart attack, heart failure, stroke, and kidney failure. Work with your health care provider to make a hypertension prevention plan that works for you. This information is not intended to replace advice given to you by your health care provider. Make sure you discuss any questions you have with your health care provider. Document Revised: 04/21/2021 Document Reviewed: 04/21/2021 Elsevier Patient Education  [place].

## 2022-03-16 ENCOUNTER — Encounter: Payer: Self-pay | Admitting: Nurse Practitioner

## 2022-03-16 LAB — CMP14+EGFR
ALT: 14 IU/L (ref 0–32)
AST: 15 IU/L (ref 0–40)
Albumin/Globulin Ratio: 2 (ref 1.2–2.2)
Albumin: 4.5 g/dL (ref 3.8–4.9)
Alkaline Phosphatase: 89 IU/L (ref 44–121)
BUN/Creatinine Ratio: 22 (ref 9–23)
BUN: 13 mg/dL (ref 6–24)
Bilirubin Total: 0.5 mg/dL (ref 0.0–1.2)
CO2: 22 mmol/L (ref 20–29)
Calcium: 9 mg/dL (ref 8.7–10.2)
Chloride: 103 mmol/L (ref 96–106)
Creatinine, Ser: 0.58 mg/dL (ref 0.57–1.00)
Globulin, Total: 2.3 g/dL (ref 1.5–4.5)
Glucose: 128 mg/dL — ABNORMAL HIGH (ref 70–99)
Potassium: 4.1 mmol/L (ref 3.5–5.2)
Sodium: 142 mmol/L (ref 134–144)
Total Protein: 6.8 g/dL (ref 6.0–8.5)
eGFR: 104 mL/min/{1.73_m2} (ref >59)

## 2022-03-16 LAB — LIPID PANEL
Chol/HDL Ratio: 3 ratio (ref 0.0–4.4)
Cholesterol, Total: 186 mg/dL (ref 100–199)
HDL: 61 mg/dL (ref >39)
LDL Chol Calc (NIH): 101 mg/dL — ABNORMAL HIGH (ref 0–99)
Triglycerides: 137 mg/dL (ref 0–149)
VLDL Cholesterol Cal: 24 mg/dL (ref 5–40)

## 2022-03-29 ENCOUNTER — Other Ambulatory Visit: Payer: Self-pay

## 2022-03-30 ENCOUNTER — Other Ambulatory Visit: Payer: No Typology Code available for payment source

## 2022-03-30 ENCOUNTER — Other Ambulatory Visit: Payer: Self-pay

## 2022-03-30 DIAGNOSIS — N289 Disorder of kidney and ureter, unspecified: Secondary | ICD-10-CM

## 2022-03-31 LAB — CMP14+EGFR
ALT: 15 IU/L (ref 0–32)
AST: 17 IU/L (ref 0–40)
Albumin/Globulin Ratio: 1.8 (ref 1.2–2.2)
Albumin: 4.4 g/dL (ref 3.8–4.9)
Alkaline Phosphatase: 83 IU/L (ref 44–121)
BUN/Creatinine Ratio: 20 (ref 9–23)
BUN: 12 mg/dL (ref 6–24)
Bilirubin Total: 0.5 mg/dL (ref 0.0–1.2)
CO2: 23 mmol/L (ref 20–29)
Calcium: 9 mg/dL (ref 8.7–10.2)
Chloride: 103 mmol/L (ref 96–106)
Creatinine, Ser: 0.61 mg/dL (ref 0.57–1.00)
Globulin, Total: 2.4 g/dL (ref 1.5–4.5)
Glucose: 138 mg/dL — ABNORMAL HIGH (ref 70–99)
Potassium: 4.2 mmol/L (ref 3.5–5.2)
Sodium: 142 mmol/L (ref 134–144)
Total Protein: 6.8 g/dL (ref 6.0–8.5)
eGFR: 103 mL/min/{1.73_m2} (ref >59)

## 2022-04-10 ENCOUNTER — Other Ambulatory Visit: Payer: Self-pay | Admitting: Nurse Practitioner

## 2022-04-10 ENCOUNTER — Other Ambulatory Visit (HOSPITAL_COMMUNITY): Payer: Self-pay

## 2022-04-10 DIAGNOSIS — E1169 Type 2 diabetes mellitus with other specified complication: Secondary | ICD-10-CM

## 2022-04-10 MED ORDER — METFORMIN HCL 500 MG PO TABS
ORAL_TABLET | Freq: Every day | ORAL | 2 refills | Status: DC
  Filled 2022-04-10: qty 30, 30d supply, fill #0
  Filled 2022-05-08: qty 30, 30d supply, fill #1

## 2022-04-14 ENCOUNTER — Other Ambulatory Visit: Payer: Self-pay | Admitting: Nurse Practitioner

## 2022-04-18 ENCOUNTER — Ambulatory Visit: Payer: No Typology Code available for payment source

## 2022-04-19 ENCOUNTER — Other Ambulatory Visit (HOSPITAL_COMMUNITY): Payer: Self-pay

## 2022-04-19 ENCOUNTER — Encounter: Payer: Self-pay | Admitting: Pharmacist Clinician (PhC)/ Clinical Pharmacy Specialist

## 2022-04-19 ENCOUNTER — Ambulatory Visit
Payer: No Typology Code available for payment source | Attending: Cardiology | Admitting: Pharmacist Clinician (PhC)/ Clinical Pharmacy Specialist

## 2022-04-19 DIAGNOSIS — E782 Mixed hyperlipidemia: Secondary | ICD-10-CM

## 2022-04-19 MED ORDER — EZETIMIBE 10 MG PO TABS
10.0000 mg | ORAL_TABLET | Freq: Every day | ORAL | 3 refills | Status: DC
  Filled 2022-04-19: qty 90, 90d supply, fill #0

## 2022-04-19 NOTE — Assessment & Plan Note (Signed)
Patient with hyperlipidemia and aortic atherosclerosis, doing well with icosapent ethyl and benzafibrate in lowering triglycerides.  LDL not much improved, 117 to 101.  Reviewed options for lowering LDL cholesterol, including ezetimibe, PCSK-9 inhibitors, bempedoic acid and inclisiran.  Discussed mechanisms of action, dosing, side effects and potential decreases in LDL cholesterol.  Also reviewed cost information and potential options for patient assistance.  Answered all patient questions.  Based on this information, patient would prefer to start ezetimibe.  She is a little concerned about how it will affect her gastroparesis, but would like to try.  She knows to reach out to me via MyChart should she not tolerate this.  Repeat labs in 2-3 months.

## 2022-04-19 NOTE — Patient Instructions (Addendum)
Your Results:             Your most recent labs Goal  Total Cholesterol 186 < 200  Triglycerides 137 < 150  HDL (happy/good cholesterol) 61 > 40  LDL (lousy/bad cholesterol 101 < 70   Medication changes:  Start Ezetimibe 10 mg once daily.  Continue with icosapent ethyl and benzafibrate  Lab orders:  We want to repeat labs after 2-3 months.  We will send you a lab order to remind you once we get closer to that time.    If you have questions or concerns, please send a MyChart message.   Thank you for choosing [facility]

## 2022-04-19 NOTE — Progress Notes (Signed)
04/19/2022 Nichole Wilson 16-Jul-1961 [id number]   HPI:  Nichole Wilson is a 61 y.o. female patient of Dr  Geraldo Pitter, who presents today for a lipid clinic evaluation.  See pertinent past medical history below.  She had labs in June of this year that showed triglycerides at 558 and LDL at 117.  She has not been able to tolerate multiple daily statin drugs and is currently on icosapent ethyl and bezafibrate (a fibrate that she gets from [place]).  Between the two her triglycerides dropped to 137 in just over 2 months.  Unfortunately neither medication is effective in getting LDL lower.    Past Medical History: HTN Controlled, on hctz 12.5 mg daily  Aortic atherosclerosis  Seen on CT 10/2018  DM2 6/23 A1c  6.1 on metformin (high of 6.6 in 2022)  Breast cancer R mastectomy 2017    Insurance Carrier:  [employer] [place] [employer]      Current Medications: icosapent ethyl 2 gm bid, benzafibrate 200 mg     Cholesterol Goals: LDL < 70   Intolerant/previously tried: rosuvastatin, pravastatin, simvastatin - myalgias     Family history: father had stoke in his 24's, puncle died MI; m aunt had heart issues; 2 daughters healthy  Diet: has gastroparesis after prior fibroid removal, watches her diet closely to avoid further problems, no refined sugars, low carb  Exercise:  walk daily  Labs: 8/23:  TC 186, TG 137, HDL 61, LDL 101        Current Outpatient Medications  Medication Sig Dispense Refill   Ascorbic Acid (VITAMIN C) 1000 MG tablet Take 1 tablet (1,000 mg total) by mouth daily.     aspirin EC 81 MG tablet Take 81 mg by mouth daily. Swallow whole.     Cholecalciferol (VITAMIN D3) 10000 units capsule Take 10,000 Units by mouth daily.     Cyanocobalamin (VITAMIN B12 PO) Take 1 capsule by mouth daily.     cycloSPORINE (RESTASIS) 0.05 % ophthalmic emulsion INSTILL 1 DROP INTO AFFECTED EYE EVERY 12 HOURS 180 each 3   hydrochlorothiazide (HYDRODIURIL) 12.5 MG tablet Take 1 tablet  (12.5 mg total) by mouth daily. 90 tablet 1   hydrocortisone cream 1 % Apply to affected area 2 times daily 30 g 1   icosapent Ethyl (VASCEPA) 1 g capsule TAKE 2 CAPSULES BY MOUTH 2 TIMES DAILY. 360 capsule 2   metFORMIN (GLUCOPHAGE) 500 MG tablet TAKE 1 TABLET BY MOUTH EVERY MORNING WITH BREAKFAST 30 tablet 2   methylPREDNISolone (MEDROL DOSEPAK) 4 MG TBPK tablet Take as directed in package for 6 days. 21 tablet 0   Multiple Vitamin (MULTIVITAMIN) tablet Take 1 tablet by mouth daily.     pantoprazole (PROTONIX) 40 MG tablet Take 1 tablet (40 mg total) by mouth daily before breakfast. 90 tablet 0   simvastatin (ZOCOR) 20 MG tablet Take 1 tablet by mouth every evening. 30 tablet 2   sucralfate (CARAFATE) 1 g tablet Take 1 tablet by mouth 3 times daily (before meals) and at bedtime as needed 120 tablet 3   vitamin E 1000 UNIT capsule Take 1,000 Units by mouth daily.     No current facility-administered medications for this visit.    Allergies  Allergen Reactions   Lisinopril Swelling   Other     General anesthesia-severe vomiting    Past Medical History:  Diagnosis Date   Abnormal LFTs 10/31/2018   ANA positive 08/06/2017   Anal fissure - posterior 11/26/2015   Anemia  Anterior scleritis of both eyes 05/12/2015   Followed by Dr Teola Bradley - Last OV 11/10, 11/18 - Update 11/18 - dx Acute toxic conjunctivitis > improved s/p prednisolone - Primary iridocyclitis (R-eye) - begin taper of prednisolone eye drops, much improved - Requested labs all resulted 11/16: ESR (21), ANA (neg), RPR (non-reactive, no FTA-Ab) >> faxed results back to Dr Sharen Counter    Blood transfusion without reported diagnosis    Breast cancer of upper-outer quadrant of right female breast ([place]) 08/26/2015   Breast cyst, left 01/04/2012   cyst no longer apparent   Cholelithiasis with choledocholithiasis    Complication of anesthesia 2002   PT STATES DURING HER C-SECTION SHE WAS AWARE OF INCISION AND PAIN -THEN WAS  QUICKLY "PUT OUT"   Controlled type 2 diabetes mellitus with diabetic nephropathy, without long-term current use of insulin ([place]) 05/12/2015   COVID-19 long hauler manifesting chronic concentration deficit 06/01/2020   Diabetes mellitus ([place]) 05/12/2015   Elevated alkaline phosphatase level 08/07/2018   Normal Alk Phos Isoenzyme   Essential hypertension 01/04/2016   Family history of adverse reaction to anesthesia    father became aggressive   Gallstone pancreatitis    Gastric mass GIST 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    Health care maintenance 03/24/2013   Hepatic steatosis    History of right breast cancer 04/28/2021   History of right mastectomy 04/28/2021   Hx of adenomatous polyp of colon 12/12/2018   Hypercholesterolemia with hypertriglyceridemia    Hypertension 01/04/2012   med d/c-last taken 12-07-11  -[place] STOPPED HER B/P MEDICATION BECAUSE HER B/P TOO LOW AND PT STATES HER B/P'S HAVE BEEN OK SINCE.   Hypertriglyceridemia    Iron deficiency anemia 10/25/2018   Migraine without aura, without mention of intractable migraine without mention of status migrainosus    Overweight (BMI 25.0-29.9) 05/12/2015   Plantar fasciitis 09/05/2013   PONV (postoperative nausea and vomiting)    patient states that she did not get sick with mastectomy   Sciatica 05/17/2012   Patient with left leg radicular lumbar pain.  No concerning symptoms aside from pain. Plan: Trial of steroid dosepak and tramadol as needed for pain Discussed warning signs or symptoms. Please see discharge instructions. Patient expresses understanding. F/u 2 weeks if not improved.     Telogen effluvium 06/01/2020    Last menstrual period 11/29/2011.   No problem-specific Assessment & Plan notes found for this encounter.   Tommy Medal PharmD CPP [place] [address] [place], [place]  [address] [phone]

## 2022-05-04 ENCOUNTER — Ambulatory Visit: Payer: No Typology Code available for payment source

## 2022-05-05 ENCOUNTER — Ambulatory Visit
Admission: RE | Admit: 2022-05-05 | Discharge: 2022-05-05 | Disposition: A | Discharge status: Home / Self Care | Payer: No Typology Code available for payment source | Source: Ambulatory Visit | Attending: Nurse Practitioner | Admitting: Nurse Practitioner

## 2022-05-05 DIAGNOSIS — Z1231 Encounter for screening mammogram for malignant neoplasm of breast: Secondary | ICD-10-CM

## 2022-05-08 ENCOUNTER — Other Ambulatory Visit (HOSPITAL_COMMUNITY): Payer: Self-pay

## 2022-05-09 ENCOUNTER — Encounter: Payer: Self-pay | Admitting: Nurse Practitioner

## 2022-05-09 ENCOUNTER — Other Ambulatory Visit (HOSPITAL_COMMUNITY): Payer: Self-pay

## 2022-05-09 ENCOUNTER — Ambulatory Visit (INDEPENDENT_AMBULATORY_CARE_PROVIDER_SITE_OTHER): Payer: No Typology Code available for payment source | Admitting: Nurse Practitioner

## 2022-05-09 VITALS — BP 124/88 | HR 87 | Temp 98.3°F | Ht 62.0 in | Wt 147.6 lb

## 2022-05-09 DIAGNOSIS — E1169 Type 2 diabetes mellitus with other specified complication: Secondary | ICD-10-CM

## 2022-05-09 DIAGNOSIS — E781 Pure hyperglyceridemia: Secondary | ICD-10-CM | POA: Diagnosis not present

## 2022-05-09 DIAGNOSIS — Z79899 Other long term (current) drug therapy: Secondary | ICD-10-CM

## 2022-05-09 DIAGNOSIS — I10 Essential (primary) hypertension: Secondary | ICD-10-CM

## 2022-05-09 DIAGNOSIS — Z0000 Encounter for general adult medical examination without abnormal findings: Secondary | ICD-10-CM | POA: Diagnosis not present

## 2022-05-09 DIAGNOSIS — N289 Disorder of kidney and ureter, unspecified: Secondary | ICD-10-CM

## 2022-05-09 DIAGNOSIS — E785 Hyperlipidemia, unspecified: Secondary | ICD-10-CM

## 2022-05-09 DIAGNOSIS — I700 Atherosclerosis of aorta: Secondary | ICD-10-CM

## 2022-05-09 DIAGNOSIS — Z853 Personal history of malignant neoplasm of breast: Secondary | ICD-10-CM

## 2022-05-09 LAB — POCT URINALYSIS DIPSTICK
Bilirubin, UA: NEGATIVE
Glucose, UA: NEGATIVE
Ketones, UA: NEGATIVE
Nitrite, UA: NEGATIVE
Protein, UA: NEGATIVE
Spec Grav, UA: 1.025 (ref 1.010–1.025)
Urobilinogen, UA: 0.2 E.U./dL
pH, UA: 6.5 (ref 5.0–8.0)

## 2022-05-09 MED ORDER — METFORMIN HCL 500 MG PO TABS
500.0000 mg | ORAL_TABLET | Freq: Every day | ORAL | 1 refills | Status: DC
  Filled 2022-05-10: qty 90, 90d supply, fill #0
  Filled 2022-08-14: qty 90, 90d supply, fill #1

## 2022-05-09 NOTE — Progress Notes (Signed)
I,Nichole Wilson,acting as a Education administrator for Nichole Brine, FNP.,have documented all relevant documentation on the behalf of Nichole Brine, FNP,as directed by  Nichole Brine, FNP while in the presence of Nichole Wilson, [place].   Subjective:     Patient ID: Nichole Wilson , female    DOB: 1961/07/13 , 61 y.o.   MRN: [id number]   Chief Complaint  Patient presents with   Annual Exam    HPI  Pt presents for HM.  She has been to the lipid clinic. She had been started on a medication but it caused her diarrhea. She is taking Vascepa and benzafibrate she gets from [place].      Past Medical History:  Diagnosis Date   Abnormal LFTs 10/31/2018   ANA positive 08/06/2017   Anal fissure - posterior 11/26/2015   Anemia    Anterior scleritis of both eyes 05/12/2015   Followed by Dr Teola Bradley - Last OV 11/10, 11/18 - Update 11/18 - dx Acute toxic conjunctivitis > improved s/p prednisolone - Primary iridocyclitis (R-eye) - begin taper of prednisolone eye drops, much improved - Requested labs all resulted 11/16: ESR (21), ANA (neg), RPR (non-reactive, no FTA-Ab) >> faxed results back to Dr Sharen Counter    Aortic atherosclerosis ([place]) 06/02/2021   Blood transfusion without reported diagnosis    Breast cancer of upper-outer quadrant of right female breast ([place]) 08/26/2015   Breast cyst, left 01/04/2012   cyst no longer apparent   Cholelithiasis with choledocholithiasis    Complication of anesthesia 2002   PT STATES DURING HER C-SECTION SHE WAS AWARE OF INCISION AND PAIN -THEN WAS QUICKLY "PUT OUT"   Controlled type 2 diabetes mellitus with diabetic nephropathy, without long-term current use of insulin ([place]) 05/12/2015   COVID-19 long hauler manifesting chronic concentration deficit 06/01/2020   Diabetes mellitus ([place]) 05/12/2015   Elevated alkaline phosphatase level 08/07/2018   Normal Alk Phos Isoenzyme   Essential hypertension 01/04/2016   Family history of adverse reaction to anesthesia    father became  aggressive   Gallstone pancreatitis    Gastric mass GIST 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    Health care maintenance 03/24/2013   Hepatic steatosis    History of right breast cancer 04/28/2021   History of right mastectomy 04/28/2021   Hx of adenomatous polyp of colon 12/12/2018   Hypercholesterolemia with hypertriglyceridemia    Hypertension 01/04/2012   med d/c-last taken 12-07-11  -[place] STOPPED HER B/P MEDICATION BECAUSE HER B/P TOO LOW AND PT STATES HER B/P'S HAVE BEEN OK SINCE.   Hypertriglyceridemia    Iron deficiency anemia 10/25/2018   Migraine without aura, without mention of intractable migraine without mention of status migrainosus    Overweight (BMI 25.0-29.9) 05/12/2015   Plantar fasciitis 09/05/2013   PONV (postoperative nausea and vomiting)    patient states that she did not get sick with mastectomy   Sciatica 05/17/2012   Patient with left leg radicular lumbar pain.  No concerning symptoms aside from pain. Plan: Trial of steroid dosepak and tramadol as needed for pain Discussed warning signs or symptoms. Please see discharge instructions. Patient expresses understanding. F/u 2 weeks if not improved.     Telogen effluvium 06/01/2020     Family History  Problem Relation Age of Onset   Diabetes Mother    Diabetes Father    Diabetes Brother    Heart disease Paternal Uncle  Liver disease Brother    Colon cancer Neg Hx    Esophageal cancer Neg Hx    Rectal cancer Neg Hx    Stomach cancer Neg Hx      Current Outpatient Medications:    Ascorbic Acid (VITAMIN C) 1000 MG tablet, Take 1 tablet (1,000 mg total) by mouth daily., Disp: , Rfl:    aspirin EC 81 MG tablet, Take 81 mg by mouth daily. Swallow whole., Disp: , Rfl:    Cholecalciferol (VITAMIN D3) 10000 units capsule, Take 10,000 Units by mouth daily., Disp: , Rfl:    Cyanocobalamin (VITAMIN B12 PO), Take 1 capsule by mouth  daily., Disp: , Rfl:    hydrochlorothiazide (HYDRODIURIL) 12.5 MG tablet, Take 1 tablet (12.5 mg total) by mouth daily., Disp: 90 tablet, Rfl: 1   hydrocortisone cream 1 %, Apply to affected area 2 times daily (Patient not taking: Reported on 05/17/2022), Disp: 30 g, Rfl: 1   icosapent Ethyl (VASCEPA) 1 g capsule, TAKE 2 CAPSULES BY MOUTH 2 TIMES DAILY., Disp: 360 capsule, Rfl: 2   sucralfate (CARAFATE) 1 g tablet, Take 1 tablet by mouth 3 times daily (before meals) and at bedtime as needed, Disp: 120 tablet, Rfl: 3   vitamin E 1000 UNIT capsule, Take 1,000 Units by mouth daily., Disp: , Rfl:    Biotin 10000 MCG TBDP, Take 10,000 mcg by mouth daily., Disp: , Rfl:    metFORMIN (GLUCOPHAGE) 500 MG tablet, Take 1 tablet (500 mg total) by mouth daily with breakfast., Disp: 90 tablet, Rfl: 1   Multiple Vitamin (MULTIVITAMIN) tablet, Take 1 tablet by mouth daily. (Patient not taking: Reported on 05/09/2022), Disp: , Rfl:    pantoprazole (PROTONIX) 40 MG tablet, Take 1 tablet (40 mg total) by mouth daily before breakfast., Disp: 90 tablet, Rfl: 0   Pitavastatin Calcium (LIVALO) 2 MG TABS, Take 1 tablet (2 mg total) by mouth daily., Disp: 90 tablet, Rfl: 3   Allergies  Allergen Reactions   Lisinopril Swelling   Other     General anesthesia-severe vomiting   Zetia [Ezetimibe] Diarrhea      The patient states she is post menopausal status.  Patient's last menstrual period was 11/29/2011.. Negative for Dysmenorrhea and Negative for Menorrhagia. Negative for: breast discharge, breast lump(s), breast pain and breast self exam. Associated symptoms include abnormal vaginal bleeding. Pertinent negatives include abnormal bleeding (hematology), anxiety, decreased libido, depression, difficulty falling sleep, dyspareunia, history of infertility, nocturia, sexual dysfunction, sleep disturbances, urinary incontinence, urinary urgency, vaginal discharge and vaginal itching. Diet regular - she is avoiding carbohydrates  and cheese.  The patient states her exercise level is moderate with walking every other day 45 minutes.   The patient's tobacco use is:  Social History   Tobacco Use  Smoking Status Never  Smokeless Tobacco Never   She has been exposed to passive smoke. The patient's alcohol use is:  Social History   Substance and Sexual Activity  Alcohol Use No   Additional information: Last pap 2020, she will call Dr. Jodi Mourning for an appt.    Review of Systems  Constitutional: Negative.   HENT: Negative.    Eyes: Negative.   Respiratory: Negative.    Cardiovascular: Negative.   Gastrointestinal: Negative.   Endocrine: Negative.   Genitourinary: Negative.   Musculoskeletal: Negative.   Skin: Negative.   Allergic/Immunologic: Negative.   Neurological: Negative.   Hematological: Negative.   Psychiatric/Behavioral: Negative.       Today's Vitals   05/09/22 0857  BP:  124/88  Pulse: 87  Temp: 98.3 F (36.8 C)  SpO2: 98%  Weight: 147 lb 9.6 oz (67 kg)  Height: 5' 2"  (1.575 m)  PainSc: 0-No pain   Body mass index is 27 kg/m.  Wt Readings from Last 3 Encounters:  05/17/22 150 lb (68 kg)  05/09/22 147 lb 9.6 oz (67 kg)  03/15/22 144 lb (65.3 kg)    Objective:  Physical Exam Vitals reviewed.  Constitutional:      General: She is not in acute distress.    Appearance: Normal appearance. She is well-developed.  HENT:     Head: Normocephalic and atraumatic.     Right Ear: Hearing, tympanic membrane, ear canal and external ear normal. There is no impacted cerumen.     Left Ear: Hearing, tympanic membrane, ear canal and external ear normal. There is no impacted cerumen.     Nose:     Comments: Deferred - masked    Mouth/Throat:     Comments: Deferred - masked Eyes:     General: Lids are normal.     Extraocular Movements: Extraocular movements intact.     Conjunctiva/sclera: Conjunctivae normal.     Pupils: Pupils are equal, round, and reactive to light.     Funduscopic exam:     Right eye: No papilledema.        Left eye: No papilledema.  Neck:     Thyroid: No thyroid mass.     Vascular: No carotid bruit.  Cardiovascular:     Rate and Rhythm: Normal rate and regular rhythm.     Pulses: Normal pulses.     Heart sounds: Normal heart sounds. No murmur heard. Pulmonary:     Effort: Pulmonary effort is normal. No respiratory distress.     Breath sounds: Normal breath sounds. No wheezing.  Chest:     Chest wall: No mass.  Breasts:    Tanner Score is 5.     Breasts are asymmetrical.     Right: Absent.     Left: Normal.  Abdominal:     General: Abdomen is flat. Bowel sounds are normal. There is no distension.     Palpations: Abdomen is soft.     Tenderness: There is no abdominal tenderness.  Genitourinary:    Comments: Followed by Dr. Jodi Mourning Musculoskeletal:        General: No swelling. Normal range of motion.     Cervical back: Full passive range of motion without pain, normal range of motion and neck supple.     Right lower leg: No edema.     Left lower leg: No edema.  Skin:    General: Skin is warm and dry.     Capillary Refill: Capillary refill takes less than 2 seconds.  Neurological:     General: No focal deficit present.     Mental Status: She is alert and oriented to person, place, and time.     Cranial Nerves: No cranial nerve deficit.     Sensory: No sensory deficit.  Psychiatric:        Mood and Affect: Mood normal.        Behavior: Behavior normal.        Thought Content: Thought content normal.        Judgment: Judgment normal.         Assessment And Plan:     1. Encounter for annual health examination  2. Essential hypertension Comments: Blood pressure is slightly elevated diastolic, encouraged to take her blood  pressure medications as directed. - POCT Urinalysis Dipstick (81002) - Microalbumin / creatinine urine ratio - EKG 12-Lead - CMP14+EGFR  3. Hypertriglyceridemia Comments: Continue current medications and f/u with  cardiology - Lipid panel  4. Hyperlipidemia associated with type 2 diabetes mellitus (HCC) - CMP14+EGFR - Hemoglobin A1c - metFORMIN (GLUCOPHAGE) 500 MG tablet; Take 1 tablet (500 mg total) by mouth daily with breakfast.  Dispense: 90 tablet; Refill: 1  5. Aortic atherosclerosis (HCC) Comments: continue statin, tolerating well.  6. History of breast cancer Comments: Next appt with Dr. Lindi Adie next month  7. Other long term (current) drug therapy - CBC    Patient was given opportunity to ask questions. Patient verbalized understanding of the plan and was able to repeat key elements of the plan. All questions were answered to their satisfaction.   Nichole Brine, FNP   I, Nichole Brine, FNP, have reviewed all documentation for this visit. The documentation on 05/09/22 for the exam, diagnosis, procedures, and orders are all accurate and complete.   THE PATIENT IS ENCOURAGED TO PRACTICE SOCIAL DISTANCING DUE TO THE COVID-19 PANDEMIC.

## 2022-05-09 NOTE — Patient Instructions (Signed)

## 2022-05-10 ENCOUNTER — Other Ambulatory Visit (HOSPITAL_COMMUNITY): Payer: Self-pay

## 2022-05-10 LAB — HEMOGLOBIN A1C
Est. average glucose Bld gHb Est-mCnc: 143 mg/dL
Hgb A1c MFr Bld: 6.6 % — ABNORMAL HIGH (ref 4.8–5.6)

## 2022-05-10 LAB — CMP14+EGFR
ALT: 16 IU/L (ref 0–32)
AST: 17 IU/L (ref 0–40)
Albumin/Globulin Ratio: 1.6 (ref 1.2–2.2)
Albumin: 4.3 g/dL (ref 3.8–4.9)
Alkaline Phosphatase: 83 IU/L (ref 44–121)
BUN/Creatinine Ratio: 24 — ABNORMAL HIGH (ref 9–23)
BUN: 14 mg/dL (ref 6–24)
Bilirubin Total: 0.3 mg/dL (ref 0.0–1.2)
CO2: 23 mmol/L (ref 20–29)
Calcium: 9.4 mg/dL (ref 8.7–10.2)
Chloride: 103 mmol/L (ref 96–106)
Creatinine, Ser: 0.58 mg/dL (ref 0.57–1.00)
Globulin, Total: 2.7 g/dL (ref 1.5–4.5)
Glucose: 145 mg/dL — ABNORMAL HIGH (ref 70–99)
Potassium: 4.5 mmol/L (ref 3.5–5.2)
Sodium: 142 mmol/L (ref 134–144)
Total Protein: 7 g/dL (ref 6.0–8.5)
eGFR: 104 mL/min/{1.73_m2} (ref >59)

## 2022-05-10 LAB — CBC
Hematocrit: 37.2 % (ref 34.0–46.6)
Hemoglobin: 12.8 g/dL (ref 11.1–15.9)
MCH: 32.1 pg (ref 26.6–33.0)
MCHC: 34.4 g/dL (ref 31.5–35.7)
MCV: 93 fL (ref 79–97)
Platelets: 296 10*3/uL (ref 150–450)
RBC: 3.99 x10E6/uL (ref 3.77–5.28)
RDW: 12 % (ref 11.7–15.4)
WBC: 5 10*3/uL (ref 3.4–10.8)

## 2022-05-10 LAB — LIPID PANEL
Chol/HDL Ratio: 4.2 ratio (ref 0.0–4.4)
Cholesterol, Total: 256 mg/dL — ABNORMAL HIGH (ref 100–199)
HDL: 61 mg/dL (ref >39)
LDL Chol Calc (NIH): 154 mg/dL — ABNORMAL HIGH (ref 0–99)
Triglycerides: 226 mg/dL — ABNORMAL HIGH (ref 0–149)
VLDL Cholesterol Cal: 41 mg/dL — ABNORMAL HIGH (ref 5–40)

## 2022-05-11 LAB — MICROALBUMIN / CREATININE URINE RATIO
Creatinine, Urine: 67.8 mg/dL
Microalb/Creat Ratio: 9 mg/g creat (ref 0–29)
Microalbumin, Urine: 5.8 ug/mL

## 2022-05-17 ENCOUNTER — Ambulatory Visit: Payer: No Typology Code available for payment source | Attending: Cardiology | Admitting: Cardiology

## 2022-05-17 ENCOUNTER — Other Ambulatory Visit (HOSPITAL_BASED_OUTPATIENT_CLINIC_OR_DEPARTMENT_OTHER): Payer: Self-pay

## 2022-05-17 ENCOUNTER — Other Ambulatory Visit (HOSPITAL_COMMUNITY): Payer: Self-pay

## 2022-05-17 ENCOUNTER — Encounter (HOSPITAL_COMMUNITY): Payer: Self-pay

## 2022-05-17 ENCOUNTER — Encounter: Payer: Self-pay | Admitting: Cardiology

## 2022-05-17 VITALS — BP 130/80 | HR 86 | Ht 62.0 in | Wt 150.0 lb

## 2022-05-17 DIAGNOSIS — E782 Mixed hyperlipidemia: Secondary | ICD-10-CM | POA: Diagnosis not present

## 2022-05-17 DIAGNOSIS — I700 Atherosclerosis of aorta: Secondary | ICD-10-CM

## 2022-05-17 DIAGNOSIS — I10 Essential (primary) hypertension: Secondary | ICD-10-CM | POA: Diagnosis not present

## 2022-05-17 MED ORDER — LIVALO 2 MG PO TABS
2.0000 mg | ORAL_TABLET | Freq: Every day | ORAL | 3 refills | Status: DC
  Filled 2022-05-17: qty 90, 90d supply, fill #0
  Filled 2022-08-21: qty 90, 90d supply, fill #1

## 2022-05-17 NOTE — Progress Notes (Signed)
Cardiology Office Note:    Date:  05/17/2022   ID:  Nichole Wilson, DOB [birth year], MRN [id number]  PCP:  [name], FNP  Cardiologist:  Jenean Lindau, MD   Referring MD: [name], FNP    ASSESSMENT:    1. Hypercholesterolemia with hypertriglyceridemia   2. Aortic atherosclerosis ([place])   3. Essential hypertension    PLAN:    In order of problems listed above:  Aortic atherosclerosis: Secondary prevention stressed with the patient.  Importance of compliance with diet and medication stressed and she vocalized understanding.  She was advised to walk at least half a liter a day 5 days a week and she promises to do so. Mixed dyslipidemia: Diet was emphasized.  Exercise stressed at extensive length.  We will initiate her on Livalo 2 mg daily.  She will also take co-Q10.  Diet emphasized.  She will be back in 6 weeks for liver lipid check. Essential hypertension: Blood pressure stable and diet was emphasized.  Lifestyle modification urged. Patient will be seen in follow-up appointment in 12 months or earlier if the patient has any concerns    Medication Adjustments/Labs and Tests Ordered: Current medicines are reviewed at length with the patient today.  Concerns regarding medicines are outlined above.  Orders Placed This Encounter  Procedures   Basic metabolic panel   Hepatic function panel   Lipid panel   Meds ordered this encounter  Medications   Pitavastatin Calcium (LIVALO) 2 MG TABS    Sig: Take 1 tablet (2 mg total) by mouth daily.    Dispense:  90 tablet    Refill:  3     No chief complaint on file.    History of Present Illness:    Nichole Wilson is a 61 y.o. female.  Patient has past medical history of essential hypertension, dyslipidemia, aortic atherosclerosis.  She leads a sedentary lifestyle.  She has discontinued her statin therapy.  She denies any chest pain orthopnea or PND.  At the time of my evaluation, the patient is alert awake oriented and in  no distress.  She promises to do better with diet.  She stopped her cholesterol medications.  Past Medical History:  Diagnosis Date   Abnormal LFTs 10/31/2018   ANA positive 08/06/2017   Anal fissure - posterior 11/26/2015   Anemia    Anterior scleritis of both eyes 05/12/2015   Followed by Dr Teola Bradley - Last OV 11/10, 11/18 - Update 11/18 - dx Acute toxic conjunctivitis > improved s/p prednisolone - Primary iridocyclitis (R-eye) - begin taper of prednisolone eye drops, much improved - Requested labs all resulted 11/16: ESR (21), ANA (neg), RPR (non-reactive, no FTA-Ab) >> faxed results back to Dr Sharen Counter    Aortic atherosclerosis ([place]) 06/02/2021   Blood transfusion without reported diagnosis    Breast cancer of upper-outer quadrant of right female breast ([place]) 08/26/2015   Breast cyst, left 01/04/2012   cyst no longer apparent   Cholelithiasis with choledocholithiasis    Complication of anesthesia 2002   PT STATES DURING HER C-SECTION SHE WAS AWARE OF INCISION AND PAIN -THEN WAS QUICKLY "PUT OUT"   Controlled type 2 diabetes mellitus with diabetic nephropathy, without long-term current use of insulin ([place]) 05/12/2015   COVID-19 long hauler manifesting chronic concentration deficit 06/01/2020   Diabetes mellitus ([place]) 05/12/2015   Elevated alkaline phosphatase level 08/07/2018   Normal Alk Phos Isoenzyme   Essential hypertension 01/04/2016   Family history of adverse reaction to anesthesia  father became aggressive   Gallstone pancreatitis    Gastric mass GIST 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    Health care maintenance 03/24/2013   Hepatic steatosis    History of right breast cancer 04/28/2021   History of right mastectomy 04/28/2021   Hx of adenomatous polyp of colon 12/12/2018   Hypercholesterolemia with hypertriglyceridemia    Hypertension 01/04/2012   med d/c-last taken 12-07-11  -[facility] STOPPED HER B/P MEDICATION BECAUSE HER B/P TOO LOW AND PT STATES HER B/P'S HAVE BEEN OK SINCE.   Hypertriglyceridemia    Iron deficiency anemia 10/25/2018   Migraine without aura, without mention of intractable migraine without mention of status migrainosus    Overweight (BMI 25.0-29.9) 05/12/2015   Plantar fasciitis 09/05/2013   PONV (postoperative nausea and vomiting)    patient states that she did not get sick with mastectomy   Sciatica 05/17/2012   Patient with left leg radicular lumbar pain.  No concerning symptoms aside from pain. Plan: Trial of steroid dosepak and tramadol as needed for pain Discussed warning signs or symptoms. Please see discharge instructions. Patient expresses understanding. F/u 2 weeks if not improved.     Telogen effluvium 06/01/2020    Past Surgical History:  Procedure Laterality Date   CESAREAN SECTION     2002   CHOLECYSTECTOMY  03/25/2012   Procedure: LAPAROSCOPIC CHOLECYSTECTOMY WITH INTRAOPERATIVE CHOLANGIOGRAM;  Surgeon: Haywood Lasso, MD;  Location: [facility];  Service: General;  Laterality: N/A;  Laparoscopic Cholecystectomy with Intraoperative Cholangiogram   COLONOSCOPY     ERCP  12/05/2011   Procedure: ENDOSCOPIC RETROGRADE CHOLANGIOPANCREATOGRAPHY (ERCP);  Surgeon: Inda Castle, MD;  Location: [facility];  Service: Endoscopy;  Laterality: N/A;   ERCP  12/08/2011   Procedure: ENDOSCOPIC RETROGRADE CHOLANGIOPANCREATOGRAPHY (ERCP);  Surgeon: Inda Castle, MD;  Location: [facility];  Service: Gastroenterology;  Laterality: N/A;   ESOPHAGOGASTRODUODENOSCOPY (EGD) WITH PROPOFOL N/A 09/28/2015   Procedure: ESOPHAGOGASTRODUODENOSCOPY (EGD) WITH PROPOFOL;  Surgeon: Clarene Essex, MD;  [facility];  Service: Endoscopy;  Laterality: N/A;  H AND P IN DRAWER   EUS N/A 10/13/2015   Procedure: UPPER ENDOSCOPIC ULTRASOUND (EUS) RADIAL;  Surgeon: Arta Silence, MD;  Location: [facility];  Service: Endoscopy;  Laterality: N/A;   LAPAROSCOPIC  PARTIAL GASTRECTOMY N/A 01/11/2016   Procedure: LAPAROSCOPIC PARTIAL GASTRECTOMY;  Surgeon: [name], MD;  Location: [facility];  Service: General;  Laterality: N/A;   MASTECTOMY Right 09/2015   MASTECTOMY W/ SENTINEL NODE BIOPSY Right 09/15/2015   Procedure: RIGHT MASTECTOMY WITH SENTINEL LYMPH NODE BIOPSY;  Surgeon: Rolm Bookbinder, MD;  Location: [facility];  Service: General;  Laterality: Right;   UPPER GI ENDOSCOPY N/A 01/11/2016   Procedure: UPPER GI ENDOSCOPY;  Surgeon: [name], MD;  Location: [facility];  Service: General;  Laterality: N/A;    Current Medications: Current Meds  Medication Sig   Ascorbic Acid (VITAMIN C) 1000 MG tablet Take 1 tablet (1,000 mg total) by mouth daily.   aspirin EC 81 MG tablet Take 81 mg by mouth daily. Swallow whole.   Biotin 10000 MCG TBDP Take 10,000 mcg by mouth daily.   Cholecalciferol (VITAMIN D3) 10000 units capsule Take 10,000 Units by mouth daily.   Cyanocobalamin (VITAMIN B12 PO) Take 1 capsule by mouth daily.   hydrochlorothiazide (HYDRODIURIL) 12.5 MG tablet Take 1 tablet (12.5 mg total) by mouth daily.   icosapent Ethyl (VASCEPA) 1 g capsule  TAKE 2 CAPSULES BY MOUTH 2 TIMES DAILY.   metFORMIN (GLUCOPHAGE) 500 MG tablet Take 1 tablet (500 mg total) by mouth daily with breakfast.   pantoprazole (PROTONIX) 40 MG tablet Take 1 tablet (40 mg total) by mouth daily before breakfast.   Pitavastatin Calcium (LIVALO) 2 MG TABS Take 1 tablet (2 mg total) by mouth daily.   sucralfate (CARAFATE) 1 g tablet Take 1 tablet by mouth 3 times daily (before meals) and at bedtime as needed   vitamin E 1000 UNIT capsule Take 1,000 Units by mouth daily.     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   Tobacco Use   Smoking status: Never   Smokeless tobacco: Never  Vaping Use   Vaping Use: Never used  Substance and Sexual Activity   Alcohol use: No   Drug use: No   Sexual activity: Not Currently  Other Topics Concern   Not on file  Social History Narrative   Married, husband is a Dealer   2 young adult daughters   - spanish interpreter   + children   No caffeine   Social Determinants of Radio broadcast assistant Strain: Not on file  Food Insecurity: Not on file  Transportation Needs: Not on file  Physical Activity: Not on file  Stress: Not on file  Social Connections: Not on file     Family History: The patient's family history includes Diabetes in her brother, father, and mother; Heart disease in her paternal uncle; Liver disease in her brother. There is no history of Colon cancer, Esophageal cancer, Rectal cancer, or Stomach cancer.  ROS:   Please see the history of present illness.    All other systems reviewed and are negative.  EKGs/Labs/Other Studies Reviewed:    The following studies were reviewed today: I discussed my findings with the patient at length.   Recent Labs: 10/06/2021: TSH 0.83 05/09/2022: ALT 16; BUN 14; Creatinine, Ser 0.58; Hemoglobin 12.8; Platelets 296; Potassium 4.5; Sodium 142  Recent Lipid Panel    Component Value Date/Time   CHOL 256 (H) 05/09/2022 0951   TRIG 226 (H) 05/09/2022 0951   HDL 61 05/09/2022 0951   CHOLHDL 4.2 05/09/2022 0951   CHOLHDL 5 05/15/2018 0938   VLDL 71.2 (H) 05/15/2018 0938   LDLCALC 154 (H) 05/09/2022 0951   LDLDIRECT 132.0 05/15/2018 0938    Physical Exam:    VS:  BP 130/80   Pulse 86   Ht _0  (1.575 m)   Wt 150 lb (68 kg)   LMP 11/29/2011   SpO2 95%   BMI 27.44 kg/m     Wt Readings from Last 3 Encounters:  05/17/22 150 lb (68 kg)  05/09/22 147 lb 9.6 oz (67 kg)  03/15/22 144 lb (65.3 kg)     GEN:  Patient is in no acute distress HEENT: Normal NECK: No JVD; No carotid bruits LYMPHATICS: No lymphadenopathy CARDIAC: Hear sounds regular, 2/6 systolic murmur at the apex. RESPIRATORY:  Clear to auscultation without rales, wheezing or rhonchi  ABDOMEN: Soft, non-tender, non-distended MUSCULOSKELETAL:  No edema;  No deformity  SKIN: Warm and dry NEUROLOGIC:  Alert and oriented x 3 PSYCHIATRIC:  Normal affect   Signed, Jenean Lindau, MD  05/17/2022 8:59 AM    [facility]

## 2022-05-17 NOTE — Patient Instructions (Signed)
Medication Instructions:  Your physician has recommended you make the following change in your medication:   Start Livalo 2 mg daily.  Take 200 mg CoQ 10 over the counter  *If you need a refill on your cardiac medications before your next appointment, please call your pharmacy*   Lab Work: Your physician recommends that you return for lab work in: 6 weeks You need to have labs done when you are fasting. You do not need to make an appointment as the order has already been placed. The labs you are going to have done are BMET, LFT and Lipids.  [address] in [place]. They also close daily for lunch for 12-1.  or [place] [address]nd floor M-W 8-11:30 and 1-4:30 and Thursday and Friday 8-11:30.   If you have labs (blood work) drawn today and your tests are completely normal, you will receive your results only by: [place] (if you have MyChart) OR A paper copy in the mail If you have any lab test that is abnormal or we need to change your treatment, we will call you to review the results.   Testing/Procedures: None ordered   Follow-Up: At [facility], you and your health needs are our priority.  As part of our continuing mission to provide you with exceptional heart care, we have created designated Provider Care Teams.  These Care Teams include your primary Cardiologist (physician) and Advanced Practice Providers (APPs -  Physician Assistants and Nurse Practitioners) who all work together to provide you with the care you need, when you need it.  We recommend signing up for the patient portal called "MyChart".  Sign up information is provided on this After Visit Summary.  MyChart is used to connect with patients for Virtual Visits (Telemedicine).  Patients are able to view lab/test results, encounter notes, upcoming appointments, etc.  Non-urgent messages can be sent to your provider as well.   To learn more about what you can do with  MyChart, go to [email or web address].    Your next appointment:   12 month(s)  The format for your next appointment:   In Person  Provider:   Jyl Heinz, MD    Other Instructions Pitavastatin Tablets What is this medication? PITAVASTATIN (pit A va STAT in) treats high cholesterol. It works by decreasing bad cholesterol and fats (such as LDL, triglycerides) and increasing good cholesterol (HDL) in your blood. It belongs to a group of medications called statins. Changes to diet and exercise are often combined with this medication. This medicine may be used for other purposes; ask your health care provider or pharmacist if you have questions. COMMON BRAND NAME(S): Livalo, Zypitamag What should I tell my care team before I take this medication? They need to know if you have any of these conditions: Diabetes (high blood sugar) Frequently drink alcohol Kidney disease Liver disease Muscle cramps, pain Stroke Thyroid disease An unusual or allergic reaction to pitavastatin, other medications, foods, dyes, or preservatives Pregnant or trying to get pregnant Breast-feeding How should I use this medication? Take this medication by mouth. Take it as directed on the prescription label at the same time every day. You can take it with or without food. If it upsets your stomach, take it with food. Keep taking it unless your care team tells you to stop. Talk to your care team about the use of this medication in children. While it may be prescribed for children as young as 8 years  for selected conditions, precautions do apply. Overdosage: If you think you have taken too much of this medicine contact a poison control center or emergency room at once. NOTE: This medicine is only for you. Do not share this medicine with others. What if I miss a dose? If you miss a dose, take it as soon as you can. If it is almost time for your next dose, take only that dose. Do not take double or extra  doses. What may interact with this medication? Do not take this medication with any of the following: Certain antibiotics, such as erythromycin and clarithromycin Cyclosporine Gemfibrozil Supplements, such as red yeast rice This medication may also interact with the following: Alcohol Certain medications that treat or prevent blood clots, such as warfarin Colchicine Fenofibrate Niacin This list may not describe all possible interactions. Give your health care provider a list of all the medicines, herbs, non-prescription drugs, or dietary supplements you use. Also tell them if you smoke, drink alcohol, or use illegal drugs. Some items may interact with your medicine. What should I watch for while using this medication? Visit your care team for regular checks on your progress. Tell your care team if your symptoms do not start to get better or if they get worse. Your care team may tell you to stop taking this medication if you develop muscle problems. If your muscle problems do not go away after stopping this medication, contact your care team. Talk to your care team if you wish to become pregnant or think you are pregnant. This medication can cause serious birth defects if taken during pregnancy. Estrogen and/or progestin hormones may not work as well while you are taking this medication. A barrier contraceptive, such as a condom or diaphragm, is recommended. Talk to your care team about other forms of contraception. Talk to your care team before breastfeeding. Changes to your treatment plan may be needed. If you are going to need surgery or another procedure, tell your care team that you are using this medication. Taking this medication is only part of a total heart healthy program. Ask your care team if there are other changes you can make to improve your overall health. What side effects may I notice from receiving this medication? Side effects that you should report to your care team as soon  as possible: Allergic reactions--skin rash, itching, hives, swelling of the face, lips, tongue, or throat High blood sugar (hyperglycemia)--increased thirst or amount of urine, unusual weakness or fatigue, blurry vision Liver injury--right upper belly pain, loss of appetite, nausea, light-colored stool, dark yellow or brown urine, yellowing skin or eyes, unusual weakness or fatigue Muscle injury--unusual weakness or fatigue, muscle pain, dark yellow or brown urine, decrease in amount of urine Side effects that usually do not require medical attention (report to your care team if they continue or are bothersome): Back pain Constipation Diarrhea Pain in the hands or feet This list may not describe all possible side effects. Call your doctor for medical advice about side effects. You may report side effects to FDA at 1-800-FDA-1088. Where should I keep my medication? Keep out of the reach of children and pets. Store between 15 and 30 degrees C (59 and 86 degrees F). Get rid of any unused medication after the expiration date. To get rid of medication that is no longer needed or has expired: Take the medication to a medication take-back program. Check with your pharmacy or law enforcement to find a location. If you cannot  return the medication, check the label or package insert to see if the medication should be thrown out in the garbage or flushed down the toilet. If you are not sure, ask your care team. If it is safe to put it in the trash, empty the medication out of the container. Mix the medication with cat litter, dirt, coffee grounds, or other unwanted substance. Seal the mixture in a bag or container. Put it in the trash. NOTE: This sheet is a summary. It may not cover all possible information. If you have questions about this medicine, talk to your doctor, pharmacist, or health care provider.  2023 Elsevier/Gold Standard (2021-07-05 00:00:00)   Important Information About Sugar

## 2022-05-18 ENCOUNTER — Other Ambulatory Visit (HOSPITAL_COMMUNITY): Payer: Self-pay

## 2022-05-19 ENCOUNTER — Other Ambulatory Visit (HOSPITAL_COMMUNITY): Payer: Self-pay

## 2022-05-19 ENCOUNTER — Encounter: Payer: Self-pay | Admitting: Hematology and Oncology

## 2022-05-24 NOTE — Progress Notes (Signed)
Patient Care Team: [name], FNP as PCP - General (General Practice)  DIAGNOSIS: No diagnosis found.  SUMMARY OF ONCOLOGIC HISTORY: Oncology History  Breast cancer of upper-outer quadrant of right female breast ([place])  11/15/2012 Imaging   DEXA scan: Normal. T-score -0.7   08/10/2015 Breast MRI   Enhancing mass in right breast upper outer quadrant 8 x 5 x 7 mm, no retroareolar right breast abnormality, no abnormality of the site of nipple retraction   08/18/2015 Initial Biopsy   Right breast biopsy: Invasive ductal carcinoma with DCIS, grade 2 with focal necrosis,ER 100%, PR 0%, Ki-67 5%,HER-2 positive ratio 4.52   08/19/2015 Mammogram   Right breast calcifications now seem to span 4 x 2 x 1.5 cm   09/15/2015 Surgery   Rt mastectomy [name]): IDC Grade 2, 0.18 cm, with DCIS, 0/1 LN, ER 100%, PR 0%, HER2 Positive 4.52, T1aN0M0 (Stage 1A)   10/12/2015 - 11/23/2015 Chemotherapy   Herceptin every 3 weeks 3 cycles (based on [facility] trial: Short herceptin protocol)   11/2015 - 09/02/2018 Anti-estrogen oral therapy   Anastrazole 1 mg daily stopped 09/04/2016 due to severe fatigue resumed May 2018     CHIEF COMPLIANT:   INTERVAL HISTORY: Nichole Wilson is a   ALLERGIES:  is allergic to lisinopril, other, and zetia [ezetimibe].  MEDICATIONS:  Current Outpatient Medications  Medication Sig Dispense Refill   Ascorbic Acid (VITAMIN C) 1000 MG tablet Take 1 tablet (1,000 mg total) by mouth daily.     aspirin EC 81 MG tablet Take 81 mg by mouth daily. Swallow whole.     Biotin 10000 MCG TBDP Take 10,000 mcg by mouth daily.     Cholecalciferol (VITAMIN D3) 10000 units capsule Take 10,000 Units by mouth daily.     Cyanocobalamin (VITAMIN B12 PO) Take 1 capsule by mouth daily.     hydrochlorothiazide (HYDRODIURIL) 12.5 MG tablet Take 1 tablet (12.5 mg total) by mouth daily. 90 tablet 1   hydrocortisone cream 1 % Apply to affected area 2 times daily (Patient not taking: Reported on  05/17/2022) 30 g 1   icosapent Ethyl (VASCEPA) 1 g capsule TAKE 2 CAPSULES BY MOUTH 2 TIMES DAILY. 360 capsule 2   metFORMIN (GLUCOPHAGE) 500 MG tablet Take 1 tablet (500 mg total) by mouth daily with breakfast. 90 tablet 1   Multiple Vitamin (MULTIVITAMIN) tablet Take 1 tablet by mouth daily. (Patient not taking: Reported on 05/09/2022)     pantoprazole (PROTONIX) 40 MG tablet Take 1 tablet (40 mg total) by mouth daily before breakfast. 90 tablet 0   Pitavastatin Calcium (LIVALO) 2 MG TABS Take 1 tablet (2 mg total) by mouth daily. 90 tablet 3   sucralfate (CARAFATE) 1 g tablet Take 1 tablet by mouth 3 times daily (before meals) and at bedtime as needed 120 tablet 3   vitamin E 1000 UNIT capsule Take 1,000 Units by mouth daily.     No current facility-administered medications for this visit.    PHYSICAL EXAMINATION: ECOG PERFORMANCE STATUS: {CHL ONC ECOG PS:[phone]}  There were no vitals filed for this visit. There were no vitals filed for this visit.  BREAST:*** No palpable masses or nodules in either right or left breasts. No palpable axillary supraclavicular or infraclavicular adenopathy no breast tenderness or nipple discharge. (exam performed in the presence of a chaperone)  LABORATORY DATA:  I have reviewed the data as listed    Latest Ref Rng & Units 05/09/2022    9:51 AM 03/30/2022  9:09 AM 03/15/2022    9:13 AM  CMP  Glucose 70 - 99 mg/dL 145  138  128   BUN 6 - 24 mg/dL _0 Creatinine 0.57 - 1.00 mg/dL 0.58  0.61  0.58   Sodium 134 - 144 mmol/L 142  142  142   Potassium 3.5 - 5.2 mmol/L 4.5  4.2  4.1   Chloride 96 - 106 mmol/L 103  103  103   CO2 20 - 29 mmol/L _1 Calcium 8.7 - 10.2 mg/dL 9.4  9.0  9.0   Total Protein 6.0 - 8.5 g/dL 7.0  6.8  6.8   Total Bilirubin 0.0 - 1.2 mg/dL 0.3  0.5  0.5   Alkaline Phos 44 - 121 IU/L 83  83  89   AST 0 - 40 IU/L _2 ALT 0 - 32 IU/L _3 Lab Results  Component Value Date   WBC 5.0  05/09/2022   HGB 12.8 05/09/2022   HCT 37.2 05/09/2022   MCV 93 05/09/2022   PLT 296 05/09/2022   NEUTROABS 3.4 10/06/2021    ASSESSMENT & PLAN:  No problem-specific Assessment & Plan notes found for this encounter.    No orders of the defined types were placed in this encounter.  The patient has a good understanding of the overall plan. she agrees with it. she will call with any problems that may develop before the next visit here. Total time spent: 30 mins including face to face time and time spent for planning, charting and co-ordination of care   [name], [place] 05/24/22    I Gardiner Coins am scribing for Dr. Lindi Adie  ***

## 2022-05-25 ENCOUNTER — Inpatient Hospital Stay
Payer: No Typology Code available for payment source | Attending: Hematology and Oncology | Admitting: Hematology and Oncology

## 2022-05-25 ENCOUNTER — Other Ambulatory Visit: Payer: Self-pay

## 2022-05-25 VITALS — BP 146/80 | HR 78 | Temp 97.7°F | Resp 18 | Ht 62.0 in | Wt 150.1 lb

## 2022-05-25 DIAGNOSIS — D509 Iron deficiency anemia, unspecified: Secondary | ICD-10-CM | POA: Insufficient documentation

## 2022-05-25 DIAGNOSIS — Z79899 Other long term (current) drug therapy: Secondary | ICD-10-CM | POA: Diagnosis not present

## 2022-05-25 DIAGNOSIS — C50411 Malignant neoplasm of upper-outer quadrant of right female breast: Secondary | ICD-10-CM | POA: Diagnosis present

## 2022-05-25 DIAGNOSIS — Z170 Estrogen receptor positive status [ER+]: Secondary | ICD-10-CM | POA: Diagnosis not present

## 2022-05-25 DIAGNOSIS — E7800 Pure hypercholesterolemia, unspecified: Secondary | ICD-10-CM | POA: Diagnosis not present

## 2022-05-25 DIAGNOSIS — R918 Other nonspecific abnormal finding of lung field: Secondary | ICD-10-CM | POA: Diagnosis not present

## 2022-05-25 DIAGNOSIS — R739 Hyperglycemia, unspecified: Secondary | ICD-10-CM | POA: Insufficient documentation

## 2022-05-25 NOTE — Assessment & Plan Note (Signed)
Rt mastectomy 09/15/15: IDC Grade 2, 0.18 cm, with DCIS, 0/1 LN, ER 100%, PR 0%, HER2 Positive 4.52, T1aN0M0 (Stage 1A) Final tumor size: 0.35 cm on biopsy +0.18 cm on final lumpectomy equals 0.53 cm   Treatment plan: 1. shorter duration of anti-HER-2 therapy based upon [facility] trial where they used only 9 weeks of Herceptin treatment. Herceptin q 3 weeks 3 cycles.   2. Adjuvant antiestrogen therapy with anastrozole 1 mg daily started 11/15/2015, stopped 09/04/2016 due to severe fatigue Resumed March 2018 stopped February 2020   Breast cancer surveillance:  1. Mammograms 05/09/2022: No mammographic evidence of malignancy.  Breast density category C  2. Breast MRI 02/11/2021: No evidence of any cancer 3. CT abdomen and pelvis 04/23/2020: No evidence of metastatic disease , fatty liver 4.  Breast exam: 05/25/2022: Benign   Upper back pain: CT scan April 2020: No evidence of metastatic disease. Small lung nodules: Will need follow-up.   Iron deficiency anemia: IV iron given April 2020   Return to clinic in 1 year for follow-up 

## 2022-05-26 ENCOUNTER — Telehealth: Payer: Self-pay | Admitting: Hematology and Oncology

## 2022-05-26 NOTE — Telephone Encounter (Signed)
Scheduled appointment per 11/9 los. Patient is aware.

## 2022-06-12 ENCOUNTER — Other Ambulatory Visit (HOSPITAL_BASED_OUTPATIENT_CLINIC_OR_DEPARTMENT_OTHER): Payer: Self-pay

## 2022-06-26 ENCOUNTER — Encounter: Payer: Self-pay | Admitting: Hematology and Oncology

## 2022-06-27 ENCOUNTER — Other Ambulatory Visit (HOSPITAL_COMMUNITY): Payer: Self-pay

## 2022-07-01 ENCOUNTER — Encounter: Payer: Self-pay | Admitting: Hematology and Oncology

## 2022-07-24 ENCOUNTER — Encounter: Payer: Self-pay | Admitting: Hematology and Oncology

## 2022-08-01 DIAGNOSIS — H52223 Regular astigmatism, bilateral: Secondary | ICD-10-CM | POA: Diagnosis not present

## 2022-08-14 ENCOUNTER — Encounter: Payer: Self-pay | Admitting: Hematology and Oncology

## 2022-08-14 ENCOUNTER — Other Ambulatory Visit (HOSPITAL_COMMUNITY): Payer: Self-pay

## 2022-08-21 ENCOUNTER — Other Ambulatory Visit (HOSPITAL_COMMUNITY): Payer: Self-pay

## 2022-09-01 ENCOUNTER — Ambulatory Visit (INDEPENDENT_AMBULATORY_CARE_PROVIDER_SITE_OTHER): Payer: 59 | Admitting: Family Medicine

## 2022-09-01 ENCOUNTER — Other Ambulatory Visit (HOSPITAL_COMMUNITY)
Admission: RE | Admit: 2022-09-01 | Discharge: 2022-09-01 | Disposition: A | Discharge status: Home / Self Care | Payer: 59 | Source: Ambulatory Visit | Attending: Family Medicine | Admitting: Family Medicine

## 2022-09-01 ENCOUNTER — Encounter: Payer: Self-pay | Admitting: Hematology and Oncology

## 2022-09-01 ENCOUNTER — Encounter: Payer: Self-pay | Admitting: Family Medicine

## 2022-09-01 VITALS — BP 125/85 | HR 102 | Ht 62.0 in | Wt 148.0 lb

## 2022-09-01 DIAGNOSIS — Z853 Personal history of malignant neoplasm of breast: Secondary | ICD-10-CM

## 2022-09-01 DIAGNOSIS — Z01419 Encounter for gynecological examination (general) (routine) without abnormal findings: Secondary | ICD-10-CM | POA: Insufficient documentation

## 2022-09-01 NOTE — Progress Notes (Signed)
ANNUAL EXAM Patient name: Nichole Wilson MRN [id number]  Date of birth: 05-27-1961 Chief Complaint:   Annual Exam  History of Present Illness:   Nichole Wilson is a 62 y.o.  [phone]  female  being seen today for a routine annual exam.  Current complaints: none  Patient's last menstrual period was 11/29/2011.    Last pap 2020. Results were:  normal . H/O abnormal pap: no Last mammogram: 05/2022. Results were: normal. Has history of right breast cancer, s/p mastectomy in 2017, s/p chemo and anti-estrogen from 2017-2020 Last colonoscopy: 2020, due 2027     05/09/2022    8:57 AM 03/15/2022    8:47 AM 04/28/2021    9:02 AM 03/10/2020    8:31 AM 02/27/2019   10:17 AM  Depression screen PHQ 2/9  Decreased Interest 0 0 0 0 0  Down, Depressed, Hopeless 0 0 0 0 0  PHQ - 2 Score 0 0 0 0 0  Altered sleeping 0      Tired, decreased energy 0      Change in appetite 0      Feeling bad or failure about yourself  0      Trouble concentrating 0      Moving slowly or fidgety/restless 0      Suicidal thoughts 0      PHQ-9 Score 0      Difficult doing work/chores Not difficult at all             No data to display           Review of Systems:   Pertinent items are noted in HPI Denies any headaches, blurred vision, fatigue, shortness of breath, chest pain, abdominal pain, abnormal vaginal discharge/itching/odor/irritation, problems with periods, bowel movements, urination, or intercourse unless otherwise stated above. Pertinent History Reviewed:  Reviewed past medical,surgical, social and family history.  Reviewed problem list, medications and allergies. Physical Assessment:   Vitals:   09/01/22 0818  BP: 125/85  Pulse: (!) 102  Weight: 148 lb (67.1 kg)  Height: 5' 2"$  (1.575 m)  Body mass index is 27.07 kg/m.        Physical Examination:   General appearance - well appearing, and in no distress  Mental status - alert, oriented to person, place, and time  Psych:  She has a  normal mood and affect  Skin - warm and dry, normal color, no suspicious lesions noted  Chest - effort normal, all lung fields clear to auscultation bilaterally  Heart - normal rate and regular rhythm  Neck:  midline trachea, no thyromegaly or nodules  Breasts - breasts appear normal, no suspicious masses, no skin or nipple changes or axillary nodes  Abdomen - soft, nontender, nondistended, no masses or organomegaly  Pelvic - VULVA: normal appearing vulva with no masses, tenderness or lesions  VAGINA: normal appearing vagina with normal color and discharge, no lesions  CERVIX: normal appearing cervix without discharge or lesions, no CMT  Thin prep pap is done with HR HPV cotesting  UTERUS: uterus is felt to be normal size, shape, consistency and nontender   ADNEXA: No adnexal masses or tenderness noted.  Extremities:  No swelling or varicosities noted  Chaperone present for exam  Assessment & Plan:  1. Well woman exam with routine gynecological exam PAP today  2. History of right breast cancer Yearly mammograms.   Labs/procedures today:   No orders of the defined types were placed in this encounter.   Meds:  No orders of the defined types were placed in this encounter.   Follow-up: No follow-ups on file.  Truett Mainland, DO 09/01/2022 9:04 AM

## 2022-09-04 ENCOUNTER — Encounter: Payer: Self-pay | Admitting: General Practice

## 2022-09-05 LAB — CYTOLOGY - PAP
Comment: NEGATIVE
Diagnosis: NEGATIVE
High risk HPV: NEGATIVE

## 2022-09-12 ENCOUNTER — Encounter: Payer: Self-pay | Admitting: Nurse Practitioner

## 2022-09-12 ENCOUNTER — Other Ambulatory Visit (HOSPITAL_COMMUNITY): Payer: Self-pay

## 2022-09-12 ENCOUNTER — Encounter: Payer: Self-pay | Admitting: Hematology and Oncology

## 2022-09-12 ENCOUNTER — Ambulatory Visit: Payer: 59 | Admitting: Nurse Practitioner

## 2022-09-12 VITALS — BP 140/90 | HR 98 | Temp 98.2°F | Ht 62.0 in | Wt 147.0 lb

## 2022-09-12 DIAGNOSIS — W19XXXA Unspecified fall, initial encounter: Secondary | ICD-10-CM | POA: Diagnosis not present

## 2022-09-12 DIAGNOSIS — Z170 Estrogen receptor positive status [ER+]: Secondary | ICD-10-CM

## 2022-09-12 DIAGNOSIS — C50411 Malignant neoplasm of upper-outer quadrant of right female breast: Secondary | ICD-10-CM | POA: Diagnosis not present

## 2022-09-12 DIAGNOSIS — M25531 Pain in right wrist: Secondary | ICD-10-CM | POA: Diagnosis not present

## 2022-09-12 DIAGNOSIS — E785 Hyperlipidemia, unspecified: Secondary | ICD-10-CM | POA: Diagnosis not present

## 2022-09-12 DIAGNOSIS — I10 Essential (primary) hypertension: Secondary | ICD-10-CM

## 2022-09-12 DIAGNOSIS — I700 Atherosclerosis of aorta: Secondary | ICD-10-CM | POA: Diagnosis not present

## 2022-09-12 DIAGNOSIS — E782 Mixed hyperlipidemia: Secondary | ICD-10-CM

## 2022-09-12 DIAGNOSIS — E1169 Type 2 diabetes mellitus with other specified complication: Secondary | ICD-10-CM

## 2022-09-12 MED ORDER — METFORMIN HCL 500 MG PO TABS
500.0000 mg | ORAL_TABLET | Freq: Every day | ORAL | 1 refills | Status: DC
  Filled 2022-09-12: qty 90, 90d supply, fill #0

## 2022-09-12 MED ORDER — PREDNISONE 20 MG PO TABS
ORAL_TABLET | ORAL | 0 refills | Status: DC
  Filled 2022-09-12: qty 6, 3d supply, fill #0

## 2022-09-12 NOTE — Progress Notes (Signed)
I,Sheena H Holbrook,acting as a Education administrator for [name], FNP.,have documented all relevant documentation on the behalf of [name], FNP,as directed by  [name], FNP while in the presence of [name], [place].    Subjective:     Patient ID: Nichole Wilson , female    DOB: [birth year] , 62 y.o.   MRN: [id number]   Chief Complaint  Patient presents with   Hypertension   Wrist Pain    Left, CTS    HPI  Patient presents today for htn follow up. She will take her medications after she gets her blood. She has been busy the last 2 days with her daughter and new granddaughter.   Having problem with carpal tunnel in left wrist, for about a month. Patient has slept with brace with little improvement. She did have a fall in January 13th when she went to her daughters baby shower and she hit her head and back on the wet grass. She did not go to urgent care.     Hypertension This is a chronic problem. The current episode started more than 1 year ago. The problem has been gradually worsening since onset. The problem is uncontrolled. Pertinent negatives include no anxiety, chest pain or palpitations. There are no compliance problems.  There is no history of chronic renal disease.     Past Medical History:  Diagnosis Date   Abnormal LFTs 10/31/2018   ANA positive 08/06/2017   Anal fissure - posterior 11/26/2015   Anemia    Anterior scleritis of both eyes 05/12/2015   Followed by Dr Teola Bradley - Last OV 11/10, 11/18 - Update 11/18 - dx Acute toxic conjunctivitis > improved s/p prednisolone - Primary iridocyclitis (R-eye) - begin taper of prednisolone eye drops, much improved - Requested labs all resulted 11/16: ESR (21), ANA (neg), RPR (non-reactive, no FTA-Ab) >> faxed results back to Dr Sharen Counter    Aortic atherosclerosis ([place]) 06/02/2021   Blood transfusion without reported diagnosis    Breast cancer of upper-outer quadrant of right female breast ([place]) 08/26/2015   Breast cyst, left  01/04/2012   cyst no longer apparent   Cholelithiasis with choledocholithiasis    Complication of anesthesia 2002   PT STATES DURING HER C-SECTION SHE WAS AWARE OF INCISION AND PAIN -THEN WAS QUICKLY "PUT OUT"   Controlled type 2 diabetes mellitus with diabetic nephropathy, without long-term current use of insulin ([place]) 05/12/2015   COVID-19 long hauler manifesting chronic concentration deficit 06/01/2020   Diabetes mellitus ([place]) 05/12/2015   Elevated alkaline phosphatase level 08/07/2018   Normal Alk Phos Isoenzyme   Essential hypertension 01/04/2016   Family history of adverse reaction to anesthesia    father became aggressive   Gallstone pancreatitis    Gastric mass GIST 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    Health care maintenance 03/24/2013   Hepatic steatosis    History of right breast cancer 04/28/2021   History of right mastectomy 04/28/2021   Hx of adenomatous polyp of colon 12/12/2018   Hypercholesterolemia with hypertriglyceridemia    Hypertension 01/04/2012   med d/c-last taken 12-07-11  -[place] STOPPED HER B/P MEDICATION BECAUSE HER B/P TOO LOW AND PT STATES HER B/P'S HAVE BEEN OK SINCE.   Hypertriglyceridemia    Iron deficiency anemia 10/25/2018   Migraine without aura, without mention of intractable migraine without mention of status migrainosus    Overweight (BMI 25.0-29.9) 05/12/2015  Plantar fasciitis 09/05/2013   PONV (postoperative nausea and vomiting)    patient states that she did not get sick with mastectomy   Sciatica 05/17/2012   Patient with left leg radicular lumbar pain.  No concerning symptoms aside from pain. Plan: Trial of steroid dosepak and tramadol as needed for pain Discussed warning signs or symptoms. Please see discharge instructions. Patient expresses understanding. F/u 2 weeks if not improved.     Telogen effluvium 06/01/2020     Family History  Problem  Relation Age of Onset   Diabetes Mother    Diabetes Father    Diabetes Brother    Heart disease Paternal Uncle    Liver disease Brother    Colon cancer Neg Hx    Esophageal cancer Neg Hx    Rectal cancer Neg Hx    Stomach cancer Neg Hx      Current Outpatient Medications:    ALPHA LIPOIC ACID PO, Take 600 mg by mouth daily., Disp: , Rfl:    Ascorbic Acid (VITAMIN C) 1000 MG tablet, Take 1 tablet (1,000 mg total) by mouth daily., Disp: , Rfl:    aspirin EC 81 MG tablet, Take 162 mg by mouth daily. Swallow whole., Disp: , Rfl:    Biotin 10000 MCG TBDP, Take 10,000 mcg by mouth daily., Disp: , Rfl:    Cholecalciferol (VITAMIN D3) 10000 units capsule, Take 10,000 Units by mouth daily., Disp: , Rfl:    co-enzyme Q-10 30 MG capsule, Take 30 mg by mouth 3 (three) times daily., Disp: , Rfl:    Cyanocobalamin (VITAMIN B12 PO), Take 1 capsule by mouth daily., Disp: , Rfl:    icosapent Ethyl (VASCEPA) 1 g capsule, TAKE 2 CAPSULES BY MOUTH 2 TIMES DAILY., Disp: 360 capsule, Rfl: 2   Multiple Vitamin (MULTIVITAMIN) tablet, Take 1 tablet by mouth daily., Disp: , Rfl:    OVER THE COUNTER MEDICATION, Take 2 tablets by mouth daily. NERVE SHIELD, Disp: , Rfl:    pantoprazole (PROTONIX) 40 MG tablet, Take 1 tablet (40 mg total) by mouth daily before breakfast., Disp: 90 tablet, Rfl: 0   Pitavastatin Calcium (LIVALO) 2 MG TABS, Take 1 tablet (2 mg total) by mouth daily., Disp: 90 tablet, Rfl: 3   predniSONE (DELTASONE) 20 MG tablet, Take 2 tablets by mouth daily for 3 days, Disp: 6 tablet, Rfl: 0   sucralfate (CARAFATE) 1 g tablet, Take 1 tablet by mouth 3 times daily (before meals) and at bedtime as needed, Disp: 120 tablet, Rfl: 3   vitamin E 1000 UNIT capsule, Take 1,000 Units by mouth daily., Disp: , Rfl:    hydrochlorothiazide (HYDRODIURIL) 12.5 MG tablet, Take 1 tablet (12.5 mg total) by mouth daily., Disp: 90 tablet, Rfl: 1   hydrocortisone cream 1 %, Apply to affected area 2 times daily (Patient  not taking: Reported on 09/01/2022), Disp: 30 g, Rfl: 1   metFORMIN (GLUCOPHAGE) 500 MG tablet, Take 1 tablet (500 mg total) by mouth daily with breakfast., Disp: 90 tablet, Rfl: 1   Allergies  Allergen Reactions   Lisinopril Swelling   Other     General anesthesia-severe vomiting   Zetia [Ezetimibe] Diarrhea     Review of Systems  Constitutional: Negative.   Respiratory: Negative.    Cardiovascular:  Negative for chest pain, palpitations and leg swelling.  Musculoskeletal:        Left wrist pain x 1 month. She has been wearing a brace.   Skin: Negative.   Neurological: Negative.  Psychiatric/Behavioral: Negative.    All other systems reviewed and are negative.    Today's Vitals   09/12/22 0926 09/12/22 1006  BP: (!) 140/100 (!) 140/90  Pulse: 98   Temp: 98.2 F (36.8 C)   TempSrc: Oral   SpO2: 99%   Weight: 147 lb (66.7 kg)   Height: '5\' 2"'$  (1.575 m)    Body mass index is 26.89 kg/m.   Objective:  Physical Exam Vitals reviewed.  Constitutional:      General: She is not in acute distress.    Appearance: Normal appearance. She is normal weight.  Cardiovascular:     Rate and Rhythm: Normal rate and regular rhythm.     Pulses: Normal pulses.     Heart sounds: Normal heart sounds. No murmur heard. Pulmonary:     Effort: Pulmonary effort is normal. No respiratory distress.     Breath sounds: Normal breath sounds. No wheezing.  Musculoskeletal:        General: Tenderness (right lateral wrist) present. No swelling. Normal range of motion.     Right wrist: Tenderness present. No swelling or deformity. Normal pulse.     Left wrist: No swelling, deformity or tenderness. Normal range of motion. Normal pulse.     Cervical back: Normal range of motion and neck supple.     Comments: Negative phalen and tinel.  Skin:    General: Skin is warm and dry.     Capillary Refill: Capillary refill takes less than 2 seconds.     Findings: No rash.  Neurological:     General: No  focal deficit present.     Mental Status: She is alert and oriented to person, place, and time.     Cranial Nerves: No cranial nerve deficit.     Motor: No weakness.  Psychiatric:        Mood and Affect: Mood normal.        Behavior: Behavior normal.        Thought Content: Thought content normal.        Judgment: Judgment normal.         Assessment And Plan:     1. Uncontrolled hypertension Comments: Elevated BP, repeat is improved. Encouraged to take medications once leaves office. Generally BP is not elevated. Recent birth of 1st granddaughter  2. Hyperlipidemia associated with type 2 diabetes mellitus ([place]) Comments: Cholesterol levels are improving, continue current medications. - metFORMIN (GLUCOPHAGE) 500 MG tablet; Take 1 tablet (500 mg total) by mouth daily with breakfast.  Dispense: 90 tablet; Refill: 1 - Hemoglobin A1c - CMP14+EGFR - Lipid panel  3. Aortic atherosclerosis ([facility]) Comments: Continue statin, tolerating well. - Lipid panel  4. Malignant neoplasm of upper-outer quadrant of right breast in female, estrogen receptor positive ([place]) Comments: Continue follow up with Oncology.  5. Right wrist pain Comments: Tenderness to radial side of wrist, positive tinels. Will treat with steroid and voltaren gel if not better will refer to hand specialist - predniSONE (DELTASONE) 20 MG tablet; Take 2 tablets by mouth daily for 3 days  Dispense: 6 tablet; Refill: 0  6. Fall, initial encounter Comments: Fall outside while raining and thinks hit the back of her head in January, she is doing better now. Did not seek treatment. Advised in future with head injury go to ER . Also discussed fall risk    Patient was given opportunity to ask questions. Patient verbalized understanding of the plan and was able to repeat key elements of the plan. All  questions were answered to their satisfaction.  [name], FNP   I, [name], FNP, have reviewed all documentation for this  visit. The documentation on 09/12/22 for the exam, diagnosis, procedures, and orders are all accurate and complete.   IF YOU HAVE BEEN REFERRED TO A SPECIALIST, IT MAY TAKE 1-2 WEEKS TO SCHEDULE/PROCESS THE REFERRAL. IF YOU HAVE NOT HEARD FROM US/SPECIALIST IN TWO WEEKS, PLEASE GIVE [place] A CALL AT [phone] X [phone].   THE PATIENT IS ENCOURAGED TO PRACTICE SOCIAL DISTANCING DUE TO THE COVID-19 PANDEMIC.

## 2022-09-13 LAB — CMP14+EGFR
ALT: 19 IU/L (ref 0–32)
AST: 15 IU/L (ref 0–40)
Albumin/Globulin Ratio: 1.8 (ref 1.2–2.2)
Albumin: 4.4 g/dL (ref 3.8–4.9)
Alkaline Phosphatase: 65 IU/L (ref 44–121)
BUN/Creatinine Ratio: 28 — ABNORMAL HIGH (ref 9–23)
BUN: 17 mg/dL (ref 6–24)
Bilirubin Total: 0.3 mg/dL (ref 0.0–1.2)
CO2: 22 mmol/L (ref 20–29)
Calcium: 9.1 mg/dL (ref 8.7–10.2)
Chloride: 106 mmol/L (ref 96–106)
Creatinine, Ser: 0.61 mg/dL (ref 0.57–1.00)
Globulin, Total: 2.4 g/dL (ref 1.5–4.5)
Glucose: 155 mg/dL — ABNORMAL HIGH (ref 70–99)
Potassium: 4.4 mmol/L (ref 3.5–5.2)
Sodium: 144 mmol/L (ref 134–144)
Total Protein: 6.8 g/dL (ref 6.0–8.5)
eGFR: 103 mL/min/{1.73_m2} (ref >59)

## 2022-09-13 LAB — LIPID PANEL
Chol/HDL Ratio: 4.2 ratio (ref 0.0–4.4)
Cholesterol, Total: 247 mg/dL — ABNORMAL HIGH (ref 100–199)
HDL: 59 mg/dL (ref >39)
LDL Chol Calc (NIH): 135 mg/dL — ABNORMAL HIGH (ref 0–99)
Triglycerides: 296 mg/dL — ABNORMAL HIGH (ref 0–149)
VLDL Cholesterol Cal: 53 mg/dL — ABNORMAL HIGH (ref 5–40)

## 2022-09-13 LAB — HEMOGLOBIN A1C
Est. average glucose Bld gHb Est-mCnc: 160 mg/dL
Hgb A1c MFr Bld: 7.2 % — ABNORMAL HIGH (ref 4.8–5.6)

## 2022-09-14 ENCOUNTER — Encounter: Payer: Self-pay | Admitting: Hematology and Oncology

## 2022-09-19 ENCOUNTER — Other Ambulatory Visit (HOSPITAL_COMMUNITY): Payer: Self-pay

## 2022-09-19 ENCOUNTER — Other Ambulatory Visit (HOSPITAL_BASED_OUTPATIENT_CLINIC_OR_DEPARTMENT_OTHER): Payer: Self-pay

## 2022-09-19 ENCOUNTER — Encounter: Payer: Self-pay | Admitting: Hematology and Oncology

## 2022-09-19 ENCOUNTER — Other Ambulatory Visit: Payer: Self-pay | Admitting: Nurse Practitioner

## 2022-09-19 DIAGNOSIS — I10 Essential (primary) hypertension: Secondary | ICD-10-CM

## 2022-09-19 MED ORDER — HYDROCHLOROTHIAZIDE 12.5 MG PO TABS
12.5000 mg | ORAL_TABLET | Freq: Every day | ORAL | 1 refills | Status: DC
  Filled 2022-09-19: qty 90, 90d supply, fill #0
  Filled 2022-12-14: qty 90, 90d supply, fill #1

## 2022-09-20 ENCOUNTER — Other Ambulatory Visit (HOSPITAL_COMMUNITY): Payer: Self-pay

## 2022-09-20 MED ORDER — METFORMIN HCL 500 MG PO TABS
500.0000 mg | ORAL_TABLET | Freq: Two times a day (BID) | ORAL | 1 refills | Status: DC
  Filled 2022-09-20 – 2022-11-07 (×2): qty 180, 90d supply, fill #0

## 2022-09-21 ENCOUNTER — Other Ambulatory Visit (HOSPITAL_COMMUNITY): Payer: Self-pay

## 2022-10-16 ENCOUNTER — Other Ambulatory Visit (HOSPITAL_COMMUNITY): Payer: Self-pay

## 2022-10-16 ENCOUNTER — Encounter: Payer: Self-pay | Admitting: Hematology and Oncology

## 2022-10-16 MED ORDER — TRULICITY 3 MG/0.5ML ~~LOC~~ SOAJ
SUBCUTANEOUS | 0 refills | Status: DC
  Filled 2022-10-16: qty 2, 28d supply, fill #0

## 2022-10-17 ENCOUNTER — Other Ambulatory Visit (HOSPITAL_BASED_OUTPATIENT_CLINIC_OR_DEPARTMENT_OTHER): Payer: Self-pay

## 2022-10-17 MED ORDER — CHLORHEXIDINE GLUCONATE 0.12 % MT SOLN
7.5000 mL | Freq: Two times a day (BID) | OROMUCOSAL | 1 refills | Status: DC
  Filled 2022-10-17: qty 473, 30d supply, fill #0

## 2022-11-07 ENCOUNTER — Encounter: Payer: Self-pay | Admitting: Hematology and Oncology

## 2022-11-07 ENCOUNTER — Other Ambulatory Visit (HOSPITAL_COMMUNITY): Payer: Self-pay

## 2022-11-07 ENCOUNTER — Other Ambulatory Visit: Payer: Self-pay | Admitting: Cardiology

## 2022-11-07 DIAGNOSIS — E781 Pure hyperglyceridemia: Secondary | ICD-10-CM

## 2022-11-07 DIAGNOSIS — E782 Mixed hyperlipidemia: Secondary | ICD-10-CM

## 2022-11-07 MED ORDER — ICOSAPENT ETHYL 1 G PO CAPS
2.0000 g | ORAL_CAPSULE | Freq: Two times a day (BID) | ORAL | 1 refills | Status: DC
Start: 2022-11-07 — End: 2023-05-07
  Filled 2022-11-07: qty 360, 90d supply, fill #0
  Filled 2023-02-08: qty 360, 90d supply, fill #1

## 2022-11-08 ENCOUNTER — Other Ambulatory Visit (HOSPITAL_COMMUNITY): Payer: Self-pay

## 2022-12-04 ENCOUNTER — Other Ambulatory Visit (HOSPITAL_COMMUNITY): Payer: Self-pay

## 2022-12-14 ENCOUNTER — Other Ambulatory Visit (HOSPITAL_COMMUNITY): Payer: Self-pay

## 2022-12-18 ENCOUNTER — Other Ambulatory Visit (HOSPITAL_COMMUNITY): Payer: Self-pay

## 2023-01-11 ENCOUNTER — Ambulatory Visit: Payer: 59 | Admitting: Nurse Practitioner

## 2023-01-11 ENCOUNTER — Encounter: Payer: Self-pay | Admitting: Hematology and Oncology

## 2023-01-11 ENCOUNTER — Other Ambulatory Visit (HOSPITAL_COMMUNITY): Payer: Self-pay

## 2023-01-11 ENCOUNTER — Encounter: Payer: Self-pay | Admitting: Nurse Practitioner

## 2023-01-11 VITALS — BP 122/80 | HR 88 | Temp 98.3°F | Ht 60.4 in | Wt 142.8 lb

## 2023-01-11 DIAGNOSIS — M79645 Pain in left finger(s): Secondary | ICD-10-CM | POA: Diagnosis not present

## 2023-01-11 DIAGNOSIS — Z789 Other specified health status: Secondary | ICD-10-CM

## 2023-01-11 DIAGNOSIS — I700 Atherosclerosis of aorta: Secondary | ICD-10-CM

## 2023-01-11 DIAGNOSIS — E1169 Type 2 diabetes mellitus with other specified complication: Secondary | ICD-10-CM | POA: Diagnosis not present

## 2023-01-11 DIAGNOSIS — Z862 Personal history of diseases of the blood and blood-forming organs and certain disorders involving the immune mechanism: Secondary | ICD-10-CM

## 2023-01-11 DIAGNOSIS — R233 Spontaneous ecchymoses: Secondary | ICD-10-CM

## 2023-01-11 DIAGNOSIS — I10 Essential (primary) hypertension: Secondary | ICD-10-CM | POA: Diagnosis not present

## 2023-01-11 DIAGNOSIS — C50411 Malignant neoplasm of upper-outer quadrant of right female breast: Secondary | ICD-10-CM

## 2023-01-11 DIAGNOSIS — E1121 Type 2 diabetes mellitus with diabetic nephropathy: Secondary | ICD-10-CM

## 2023-01-11 DIAGNOSIS — Z79899 Other long term (current) drug therapy: Secondary | ICD-10-CM | POA: Diagnosis not present

## 2023-01-11 DIAGNOSIS — E785 Hyperlipidemia, unspecified: Secondary | ICD-10-CM

## 2023-01-11 DIAGNOSIS — Z170 Estrogen receptor positive status [ER+]: Secondary | ICD-10-CM

## 2023-01-11 HISTORY — DX: Personal history of diseases of the blood and blood-forming organs and certain disorders involving the immune mechanism: Z86.2

## 2023-01-11 HISTORY — DX: Other specified health status: Z78.9

## 2023-01-11 HISTORY — DX: Hyperlipidemia, unspecified: E78.5

## 2023-01-11 HISTORY — DX: Spontaneous ecchymoses: R23.3

## 2023-01-11 MED ORDER — HYDROCHLOROTHIAZIDE 12.5 MG PO TABS
12.5000 mg | ORAL_TABLET | Freq: Every day | ORAL | 1 refills | Status: DC
Start: 2023-01-11 — End: 2023-06-05
  Filled 2023-01-11 – 2023-03-30 (×2): qty 90, 90d supply, fill #0
  Filled 2023-05-25: qty 90, 90d supply, fill #1

## 2023-01-11 MED ORDER — METFORMIN HCL 500 MG PO TABS
500.0000 mg | ORAL_TABLET | Freq: Two times a day (BID) | ORAL | 1 refills | Status: DC
Start: 2023-01-11 — End: 2023-08-29
  Filled 2023-01-11 – 2023-02-08 (×2): qty 180, 90d supply, fill #0
  Filled 2023-05-07: qty 180, 90d supply, fill #1

## 2023-01-11 NOTE — Patient Instructions (Addendum)

## 2023-01-11 NOTE — Assessment & Plan Note (Signed)
Blood pressure is better controlled.  Continue current medications. 

## 2023-01-11 NOTE — Progress Notes (Signed)
I,Jameka J Llittleton, CMA,acting as a Neurosurgeon for [employer], FNP.,have documented all relevant documentation on the behalf of Arnette Felts, FNP,as directed by  Arnette Felts, FNP while in the presence of Arnette Felts, FNP.  Subjective:  Patient ID: Nichole Wilson , female    DOB: [birth year] , 62 y.o.   MRN: [id number]  Chief Complaint  Patient presents with   Diabetes   Hypertension    HPI  Patient presents today for a bp and diabetes check. Patient reports compliance with her meds. She did not take her medications this morning.   Patient also reports she has been noticing brusing on her hand and left middle finger has been hurting around her knuckle. She reports she feels a little bump. She reports she was taking 2 baby aspirins but she has stopped taking it completely for right now.    Diabetes She presents for her follow-up diabetic visit. She has type 2 diabetes mellitus. Her disease course has been stable. Pertinent negatives for hypoglycemia include no dizziness or headaches. There are no diabetic associated symptoms. Pertinent negatives for diabetes include no chest pain, no fatigue, no polydipsia, no polyphagia and no polyuria.  Hypertension This is a chronic problem. The current episode started more than 1 year ago. The problem has been gradually worsening since onset. The problem is uncontrolled. Pertinent negatives include no anxiety, chest pain, headaches or palpitations. There are no known risk factors for coronary artery disease. Past treatments include nothing. There are no compliance problems.  There is no history of angina. There is no history of chronic renal disease.     Past Medical History:  Diagnosis Date   Abnormal LFTs 10/31/2018   ANA positive 08/06/2017   Anal fissure - posterior 11/26/2015   Anemia    Anterior scleritis of both eyes 05/12/2015   Followed by Dr Cephus Richer - Last OV 11/10, 11/18 - Update 11/18 - dx Acute toxic conjunctivitis > improved s/p  prednisolone - Primary iridocyclitis (R-eye) - begin taper of prednisolone eye drops, much improved - Requested labs all resulted 11/16: ESR (21), ANA (neg), RPR (non-reactive, no FTA-Ab) >> faxed results back to Dr Carlynn Purl    Aortic atherosclerosis ([facility]) 06/02/2021   Blood transfusion without reported diagnosis    Breast cancer of upper-outer quadrant of right female breast ([facility]) 08/26/2015   Breast cyst, left 01/04/2012   cyst no longer apparent   Cholelithiasis with choledocholithiasis    Complication of anesthesia 2002   PT STATES DURING HER C-SECTION SHE WAS AWARE OF INCISION AND PAIN -THEN WAS QUICKLY "PUT OUT"   Controlled type 2 diabetes mellitus with diabetic nephropathy, without long-term current use of insulin ([facility]) 05/12/2015   COVID-19 long hauler manifesting chronic concentration deficit 06/01/2020   Diabetes mellitus ([facility]) 05/12/2015   Elevated alkaline phosphatase level 08/07/2018   Normal Alk Phos Isoenzyme   Essential hypertension 01/04/2016   Family history of adverse reaction to anesthesia    father became aggressive   Gallstone pancreatitis    Gastric mass GIST 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    Health care maintenance 03/24/2013   Hepatic steatosis    History of right breast cancer 04/28/2021   History of right mastectomy 04/28/2021   Hx of adenomatous polyp of colon 12/12/2018   Hypercholesterolemia with hypertriglyceridemia    Hypertension 01/04/2012   med d/c-last taken 12-07-11  -PT STATES HER MEDICAL DOCTOR STOPPED HER B/P MEDICATION BECAUSE  HER B/P TOO LOW AND PT STATES HER B/P'S HAVE BEEN OK SINCE.   Hypertriglyceridemia    Iron deficiency anemia 10/25/2018   Migraine without aura, without mention of intractable migraine without mention of status migrainosus    Overweight (BMI 25.0-29.9) 05/12/2015   Plantar fasciitis 09/05/2013   PONV (postoperative nausea and vomiting)    patient states  that she did not get sick with mastectomy   PONV (postoperative nausea and vomiting)    patient states that she did not get sick with mastectomy   Sciatica 05/17/2012   Patient with left leg radicular lumbar pain.  No concerning symptoms aside from pain. Plan: Trial of steroid dosepak and tramadol as needed for pain Discussed warning signs or symptoms. Please see discharge instructions. Patient expresses understanding. F/u 2 weeks if not improved.     Telogen effluvium 06/01/2020     Family History  Problem Relation Age of Onset   Diabetes Mother    Diabetes Father    Diabetes Brother    Heart disease Paternal Uncle    Liver disease Brother    Colon cancer Neg Hx    Esophageal cancer Neg Hx    Rectal cancer Neg Hx    Stomach cancer Neg Hx      Current Outpatient Medications:    ALPHA LIPOIC ACID PO, Take 600 mg by mouth daily., Disp: , Rfl:    icosapent Ethyl (VASCEPA) 1 g capsule, Take 2 capsules (2 g total) by mouth 2 (two) times daily., Disp: 360 capsule, Rfl: 1   aspirin EC 81 MG tablet, Take 162 mg by mouth daily. Swallow whole. (Patient not taking: Reported on 01/11/2023), Disp: , Rfl:    hydrochlorothiazide (HYDRODIURIL) 12.5 MG tablet, Take 1 tablet (12.5 mg total) by mouth daily., Disp: 90 tablet, Rfl: 1   metFORMIN (GLUCOPHAGE) 500 MG tablet, Take 1 tablet (500 mg total) by mouth 2 (two) times daily with a meal., Disp: 180 tablet, Rfl: 1   pantoprazole (PROTONIX) 40 MG tablet, Take 1 tablet (40 mg total) by mouth daily before breakfast., Disp: 90 tablet, Rfl: 0   Allergies  Allergen Reactions   Lisinopril Swelling   Other     General anesthesia-severe vomiting   Zetia [Ezetimibe] Diarrhea     Review of Systems  Constitutional: Negative.  Negative for fatigue.  Eyes: Negative.   Cardiovascular:  Negative for chest pain and palpitations.  Endocrine: Negative for polydipsia, polyphagia and polyuria.  Musculoskeletal: Negative.   Skin: Negative.   Neurological:   Negative for dizziness and headaches.  Psychiatric/Behavioral: Negative.       Today's Vitals   01/11/23 0818  BP: 122/80  Pulse: 88  Temp: 98.3 F (36.8 C)  Weight: 142 lb 12.8 oz (64.8 kg)  Height: 5' 0.4" (1.534 m)  PainSc: 8   PainLoc: Finger   Body mass index is 27.52 kg/m.  Wt Readings from Last 3 Encounters:  01/11/23 142 lb 12.8 oz (64.8 kg)  09/12/22 147 lb (66.7 kg)  09/01/22 148 lb (67.1 kg)    The 10-year ASCVD risk score (Arnett DK, et al., 2019) is: 8.6%   Values used to calculate the score:     Age: 31 years     Sex: Female     Is Non-Hispanic African American: No     Diabetic: Yes     Tobacco smoker: No     Systolic Blood Pressure: 122 mmHg     Is BP treated: Yes     HDL  Cholesterol: 59 mg/dL     Total Cholesterol: 247 mg/dL  Objective:  Physical Exam Vitals reviewed.  Constitutional:      General: She is not in acute distress.    Appearance: Normal appearance. She is normal weight.  Cardiovascular:     Rate and Rhythm: Normal rate and regular rhythm.     Pulses: Normal pulses.     Heart sounds: Normal heart sounds. No murmur heard. Pulmonary:     Effort: Pulmonary effort is normal. No respiratory distress.     Breath sounds: Normal breath sounds. No wheezing.  Musculoskeletal:        General: Tenderness (left 2nd finger anterior with small firm nodule palpated) present. No swelling. Normal range of motion.     Right wrist: Tenderness present. No swelling or deformity. Normal pulse.     Left wrist: No swelling, deformity or tenderness. Normal range of motion. Normal pulse.     Cervical back: Normal range of motion and neck supple.     Comments: Left 2nd metatarsal with bruise and raised area.   Skin:    General: Skin is warm and dry.     Capillary Refill: Capillary refill takes less than 2 seconds.     Findings: Bruising (one area on right dorsal foot dark in color, right anterior hand with slightly dark bruise) present. No rash.  Neurological:      General: No focal deficit present.     Mental Status: She is alert and oriented to person, place, and time.     Cranial Nerves: No cranial nerve deficit.     Motor: No weakness.  Psychiatric:        Mood and Affect: Mood normal.        Behavior: Behavior normal.        Thought Content: Thought content normal.        Judgment: Judgment normal.         Assessment And Plan:  Controlled type 2 diabetes mellitus with diabetic nephropathy, without long-term current use of insulin ([facility]) Assessment & Plan: HgbA1c increased at last visit, continue taking metformin as directed.  Orders: -     Hemoglobin A1c -     metFORMIN HCl; Take 1 tablet (500 mg total) by mouth 2 (two) times daily with a meal.  Dispense: 180 tablet; Refill: 1  Hyperlipidemia associated with type 2 diabetes mellitus ([facility]) Assessment & Plan: Cholesterol levels have improved, continue current medications.    Aortic atherosclerosis ([facility]) Assessment & Plan: Continue Vascepa, unable to tolerate statin  Orders: -     Lipid panel  Essential hypertension Assessment & Plan: Blood pressure is better controlled. Continue current medications  Orders: -     Basic metabolic panel -     hydroCHLOROthiazide; Take 1 tablet (12.5 mg total) by mouth daily.  Dispense: 90 tablet; Refill: 1  Malignant neoplasm of upper-outer quadrant of right breast in female, estrogen receptor positive ([facility]) Assessment & Plan: Continue f/u with Dr. Pamelia [facility]   History of anemia  Abnormal bruising Assessment & Plan: Will check for any bleeding disorders.   Orders: -     CBC with Differential/Platelet -     Autoimmune Profile -     Iron, TIBC and Ferritin Panel -     VITAMIN D 25 Hydroxy (Vit-D Deficiency, Fractures) -     TSH  Other long term (current) drug therapy  Statin intolerance  Pain of finger of left hand Tender to left 2nd finger at 2nd  joint with firm nodule palpated, will check an xray -     DG Hand  Complete Left; Future   Return for keep next appt as scheduled .  Patient was given opportunity to ask questions. Patient verbalized understanding of the plan and was able to repeat key elements of the plan. All questions were answered to their satisfaction.  Arnette Felts, FNP  I, Arnette Felts, FNP, have reviewed all documentation for this visit. The documentation on 01/11/23 for the exam, diagnosis, procedures, and orders are all accurate and complete.   IF YOU HAVE BEEN REFERRED TO A SPECIALIST, IT MAY TAKE 1-2 WEEKS TO SCHEDULE/PROCESS THE REFERRAL. IF YOU HAVE NOT HEARD FROM US/SPECIALIST IN TWO WEEKS, PLEASE GIVE [place] A CALL AT [phone] X [phone].

## 2023-01-11 NOTE — Assessment & Plan Note (Signed)
HgbA1c increased at last visit, continue taking metformin as directed.

## 2023-01-11 NOTE — Assessment & Plan Note (Signed)
Cholesterol levels have improved, continue current medications.

## 2023-01-11 NOTE — Assessment & Plan Note (Signed)
Continue f/u with Dr. Pamelia Ho[facility]cology

## 2023-01-11 NOTE — Assessment & Plan Note (Signed)
Continue Vascepa, unable to tolerate statin

## 2023-01-11 NOTE — Assessment & Plan Note (Signed)
Will check for any bleeding disorders.

## 2023-01-12 LAB — LIPID PANEL
Chol/HDL Ratio: 5.5 ratio — ABNORMAL HIGH (ref 0.0–4.4)
Cholesterol, Total: 259 mg/dL — ABNORMAL HIGH (ref 100–199)
HDL: 47 mg/dL (ref >39)
LDL Chol Calc (NIH): 130 mg/dL — ABNORMAL HIGH (ref 0–99)
Triglycerides: 455 mg/dL — ABNORMAL HIGH (ref 0–149)
VLDL Cholesterol Cal: 82 mg/dL — ABNORMAL HIGH (ref 5–40)

## 2023-01-12 LAB — AUTOIMMUNE PROFILE
Anti Nuclear Antibody (ANA): NEGATIVE
Complement C3, Serum: 144 mg/dL (ref 82–167)
dsDNA Ab: <1 IU/mL (ref 0–9)

## 2023-01-12 LAB — CBC WITH DIFFERENTIAL/PLATELET
Basophils Absolute: 0 10*3/uL (ref 0.0–0.2)
Basos: 1 %
EOS (ABSOLUTE): 0.1 10*3/uL (ref 0.0–0.4)
Eos: 1 %
Hematocrit: 38.3 % (ref 34.0–46.6)
Hemoglobin: 12.7 g/dL (ref 11.1–15.9)
Immature Grans (Abs): 0 10*3/uL (ref 0.0–0.1)
Immature Granulocytes: 0 %
Lymphocytes Absolute: 1.8 10*3/uL (ref 0.7–3.1)
Lymphs: 34 %
MCH: 31.3 pg (ref 26.6–33.0)
MCHC: 33.2 g/dL (ref 31.5–35.7)
MCV: 94 fL (ref 79–97)
Monocytes Absolute: 0.4 10*3/uL (ref 0.1–0.9)
Monocytes: 7 %
Neutrophils Absolute: 3 10*3/uL (ref 1.4–7.0)
Neutrophils: 57 %
Platelets: 295 10*3/uL (ref 150–450)
RBC: 4.06 x10E6/uL (ref 3.77–5.28)
RDW: 13.1 % (ref 11.7–15.4)
WBC: 5.3 10*3/uL (ref 3.4–10.8)

## 2023-01-12 LAB — BASIC METABOLIC PANEL
BUN/Creatinine Ratio: 28 (ref 12–28)
BUN: 17 mg/dL (ref 8–27)
CO2: 23 mmol/L (ref 20–29)
Calcium: 9.4 mg/dL (ref 8.7–10.3)
Chloride: 104 mmol/L (ref 96–106)
Creatinine, Ser: 0.6 mg/dL (ref 0.57–1.00)
Glucose: 131 mg/dL — ABNORMAL HIGH (ref 70–99)
Potassium: 4.1 mmol/L (ref 3.5–5.2)
Sodium: 143 mmol/L (ref 134–144)
eGFR: 103 mL/min/{1.73_m2} (ref >59)

## 2023-01-12 LAB — VITAMIN D 25 HYDROXY (VIT D DEFICIENCY, FRACTURES): Vit D, 25-Hydroxy: 27.2 ng/mL — ABNORMAL LOW (ref 30.0–100.0)

## 2023-01-12 LAB — IRON,TIBC AND FERRITIN PANEL
Ferritin: 38 ng/mL (ref 15–150)
Iron Saturation: 21 % (ref 15–55)
Iron: 73 ug/dL (ref 27–159)
Total Iron Binding Capacity: 352 ug/dL (ref 250–450)
UIBC: 279 ug/dL (ref 131–425)

## 2023-01-12 LAB — HEMOGLOBIN A1C
Est. average glucose Bld gHb Est-mCnc: 140 mg/dL
Hgb A1c MFr Bld: 6.5 % — ABNORMAL HIGH (ref 4.8–5.6)

## 2023-01-12 LAB — TSH: TSH: 1.38 u[IU]/mL (ref 0.450–4.500)

## 2023-02-08 ENCOUNTER — Other Ambulatory Visit (HOSPITAL_COMMUNITY): Payer: Self-pay

## 2023-03-30 ENCOUNTER — Other Ambulatory Visit (HOSPITAL_COMMUNITY): Payer: Self-pay

## 2023-05-07 ENCOUNTER — Other Ambulatory Visit (HOSPITAL_COMMUNITY): Payer: Self-pay

## 2023-05-07 ENCOUNTER — Other Ambulatory Visit: Payer: Self-pay | Admitting: Cardiology

## 2023-05-07 DIAGNOSIS — E781 Pure hyperglyceridemia: Secondary | ICD-10-CM

## 2023-05-07 DIAGNOSIS — E782 Mixed hyperlipidemia: Secondary | ICD-10-CM

## 2023-05-07 MED ORDER — ICOSAPENT ETHYL 1 G PO CAPS
2.0000 g | ORAL_CAPSULE | Freq: Two times a day (BID) | ORAL | 0 refills | Status: DC
Start: 2023-05-07 — End: 2023-08-29
  Filled 2023-05-07: qty 360, 90d supply, fill #0

## 2023-05-07 NOTE — Telephone Encounter (Signed)
RX sent to the pharmacy  

## 2023-05-08 ENCOUNTER — Other Ambulatory Visit (HOSPITAL_COMMUNITY): Payer: Self-pay

## 2023-05-16 ENCOUNTER — Encounter: Payer: No Typology Code available for payment source | Admitting: Nurse Practitioner

## 2023-05-25 ENCOUNTER — Other Ambulatory Visit (HOSPITAL_COMMUNITY): Payer: Self-pay

## 2023-05-28 ENCOUNTER — Inpatient Hospital Stay: Payer: 59 | Attending: Hematology and Oncology | Admitting: Hematology and Oncology

## 2023-05-28 NOTE — Assessment & Plan Note (Deleted)
Rt mastectomy 09/15/15: IDC Grade 2, 0.18 cm, with DCIS, 0/1 LN, ER 100%, PR 0%, HER2 Positive 4.52, T1aN0M0 (Stage 1A) Final tumor size: 0.35 cm on biopsy +0.18 cm on final lumpectomy equals 0.53 cm   Treatment plan: 1. shorter duration of anti-HER-2 therapy based upon [facility] trial where they used only 9 weeks of Herceptin treatment. Herceptin q 3 weeks 3 cycles.   2. Adjuvant antiestrogen therapy with anastrozole 1 mg daily started 11/15/2015, stopped 09/04/2016 due to severe fatigue Resumed March 2018 stopped February 2020   Breast cancer surveillance:  1. Mammograms 05/09/2022: No mammographic evidence of malignancy.  Breast density category C  2. Breast MRI 02/11/2021: No evidence of any cancer 3. CT abdomen and pelvis 04/23/2020: No evidence of metastatic disease , fatty liver 4.  Breast exam: 05/28/2023: Benign   Upper back pain: CT scan April 2020: No evidence of metastatic disease. Elevated blood sugars and cholesterol: She is following up with her primary care doctor for this.   Iron deficiency anemia: IV iron given April 2020   Return to clinic in 1 year for follow-up

## 2023-05-29 ENCOUNTER — Other Ambulatory Visit (HOSPITAL_COMMUNITY): Payer: Self-pay

## 2023-05-30 ENCOUNTER — Other Ambulatory Visit (HOSPITAL_COMMUNITY): Payer: Self-pay

## 2023-06-04 DIAGNOSIS — R7302 Impaired glucose tolerance (oral): Secondary | ICD-10-CM | POA: Insufficient documentation

## 2023-06-05 ENCOUNTER — Ambulatory Visit: Payer: 59 | Attending: Cardiology | Admitting: Cardiology

## 2023-06-05 ENCOUNTER — Encounter: Payer: Self-pay | Admitting: Cardiology

## 2023-06-05 ENCOUNTER — Other Ambulatory Visit: Payer: Self-pay

## 2023-06-05 ENCOUNTER — Other Ambulatory Visit (HOSPITAL_COMMUNITY): Payer: Self-pay

## 2023-06-05 VITALS — BP 160/90 | HR 80 | Ht 62.0 in | Wt 143.0 lb

## 2023-06-05 DIAGNOSIS — I10 Essential (primary) hypertension: Secondary | ICD-10-CM | POA: Diagnosis not present

## 2023-06-05 DIAGNOSIS — I700 Atherosclerosis of aorta: Secondary | ICD-10-CM | POA: Diagnosis not present

## 2023-06-05 DIAGNOSIS — E782 Mixed hyperlipidemia: Secondary | ICD-10-CM

## 2023-06-05 DIAGNOSIS — E1121 Type 2 diabetes mellitus with diabetic nephropathy: Secondary | ICD-10-CM

## 2023-06-05 MED ORDER — PITAVASTATIN CALCIUM 1 MG PO TABS
1.0000 mg | ORAL_TABLET | Freq: Every day | ORAL | 3 refills | Status: DC
  Filled 2023-06-05: qty 90, 90d supply, fill #0
  Filled 2023-09-03: qty 90, 90d supply, fill #1
  Filled 2023-12-28: qty 90, 90d supply, fill #2
  Filled 2024-03-28: qty 90, 90d supply, fill #3

## 2023-06-05 MED ORDER — HYDROCHLOROTHIAZIDE 12.5 MG PO TABS
12.5000 mg | ORAL_TABLET | Freq: Every day | ORAL | 3 refills | Status: AC
Start: 2023-06-05 — End: ?
  Filled 2023-06-05 – 2023-06-07 (×2): qty 90, 90d supply, fill #0
  Filled 2023-09-21: qty 90, 90d supply, fill #1
  Filled 2024-01-02 – 2024-01-17 (×2): qty 90, 90d supply, fill #2
  Filled 2024-04-17: qty 90, 90d supply, fill #3

## 2023-06-05 NOTE — Addendum Note (Signed)
Addended by: Lonia Farber on: 06/05/2023 02:03 PM   Modules accepted: Orders

## 2023-06-05 NOTE — Addendum Note (Signed)
Addended by: Lonia Farber on: 06/05/2023 09:12 AM   Modules accepted: Orders

## 2023-06-05 NOTE — Patient Instructions (Signed)
Medication Instructions:   Your physician has recommended you make the following change in your medication:   Start taking Livalo 1 mg every day.   *If you need a refill on your cardiac medications before your next appointment, please call your pharmacy*   Lab Work:  Today you will need a CMP, and a lipid panel.   In 6 weeks you will need fasting blood work, LFTs and a lipid panel.   If you have labs (blood work) drawn today and your tests are completely normal, you will receive your results only by: MyChart Message (if you have MyChart) OR A paper copy in the mail If you have any lab test that is abnormal or we need to change your treatment, we will call you to review the results.   Testing/Procedures: None   Follow-Up: At [facility], you and your health needs are our priority.  As part of our continuing mission to provide you with exceptional heart care, we have created designated Provider Care Teams.  These Care Teams include your primary Cardiologist (physician) and Advanced Practice Providers (APPs -  Physician Assistants and Nurse Practitioners) who all work together to provide you with the care you need, when you need it.  We recommend signing up for the patient portal called "MyChart".  Sign up information is provided on this After Visit Summary.  MyChart is used to connect with patients for Virtual Visits (Telemedicine).  Patients are able to view lab/test results, encounter notes, upcoming appointments, etc.  Non-urgent messages can be sent to your provider as well.   To learn more about what you can do with MyChart, go to [email or web address].    Your next appointment:   12 month(s)  Provider:   Belva Crome, MD Other Instructions

## 2023-06-05 NOTE — Progress Notes (Signed)
Cardiology Office Note:    Date:  06/05/2023   ID:  Nichole Wilson, DOB 1961-01-23, MRN [id number]  PCP:  Arnette Felts, FNP  Cardiologist:  Garwin Brothers, MD   Referring MD: Arnette Felts, FNP    ASSESSMENT:    1. Hypercholesterolemia with hypertriglyceridemia   2. Aortic atherosclerosis (HCC)   3. Essential hypertension   4. Controlled type 2 diabetes mellitus with diabetic nephropathy, without long-term current use of insulin (HCC)    PLAN:    In order of problems listed above:  Aortic atherosclerosis: Secondary prevention stressed with patient.  Importance of compliance with diet medication stressed and she vocalized understanding.  She was advised to walk half another day on a regular basis to the best of her ability she promises to do so.  She is asymptomatic at this time Mixed dyslipidemia: Not on lipid-lowering medications.  She tells me that statins well with her.  She is willing to try pitavastatin 1 mg daily.  She was told to take co-Q10 with it and come back in 6 weeks for liver lipid check if she has any issue with this medication she will let us know.  Goal LDL is less than 60. Essential hypertension: Hydrochlorothiazide will be resumed.  Potassium supplementation and diet was encouraged.  Follow-up blood work.  By primary care.  She will get blood pressure readings to [place] in a week and we will monitor them. Patient will be seen in follow-up appointment in 6 months or earlier if the patient has any concerns.    Medication Adjustments/Labs and Tests Ordered: Current medicines are reviewed at length with the patient today.  Concerns regarding medicines are outlined above.  Orders Placed This Encounter  Procedures   EKG 12-Lead   No orders of the defined types were placed in this encounter.    No chief complaint on file.    History of Present Illness:    Nichole Wilson is a 62 y.o. female.  Patient has past medical history of aortic atherosclerosis, essential  hypertension and mixed dyslipidemia.  She is a diabetic.  She denies any problems at this time and takes care of activities of daily living.  No chest pain orthopnea or PND.  At the time of my evaluation, the patient is alert awake oriented and in no distress.  She has not used diuretic over the last few days as she lost her medications.  Past Medical History:  Diagnosis Date   Abnormal bruising 01/11/2023   ANA positive 08/06/2017   Anal fissure - posterior 11/26/2015   Anemia    Anterior scleritis of both eyes 05/12/2015   Followed by Dr Cephus Richer - Last OV 11/10, 11/18 - Update 11/18 - dx Acute toxic conjunctivitis > improved s/p prednisolone - Primary iridocyclitis (R-eye) - begin taper of prednisolone eye drops, much improved - Requested labs all resulted 11/16: ESR (21), ANA (neg), RPR (non-reactive, no FTA-Ab) >> faxed results back to Dr Carlynn Purl    Aortic atherosclerosis (HCC) 06/02/2021   Breast cancer of upper-outer quadrant of right female breast (HCC) 08/26/2015   Cholelithiasis with choledocholithiasis    Controlled type 2 diabetes mellitus with diabetic nephropathy, without long-term current use of insulin (HCC) 05/12/2015   Diabetes mellitus (HCC) 05/12/2015   Elevated alkaline phosphatase level 08/07/2018   Normal Alk Phos Isoenzyme   Essential hypertension 01/04/2016   Family history of adverse reaction to anesthesia    father became aggressive   Gastric mass GIST vs leiomyoma 12/01/2015  Glucose intolerance (impaired glucose tolerance)    since at least 2011.    Health care maintenance 03/24/2013   Hepatic steatosis    History of anemia 01/11/2023   History of right breast cancer 04/28/2021   History of right mastectomy 04/28/2021   Hx of adenomatous polyp of colon 12/12/2018   Hypercholesterolemia with hypertriglyceridemia    Hyperlipidemia associated with type 2 diabetes mellitus (HCC) 01/11/2023   Hypertension 01/04/2012   med d/c-last taken 12-07-11  -PT STATES  HER MEDICAL DOCTOR STOPPED HER B/P MEDICATION BECAUSE HER B/P TOO LOW AND PT STATES HER B/P'S HAVE BEEN OK SINCE.   Hypertriglyceridemia    Iron deficiency anemia 10/25/2018   Migraine without aura 01/09/2007   Qualifier: Diagnosis of   By: Tressia Danas  MD, Adlih      IMO SNOMED Dx Update Oct 2024     Overweight (BMI 25.0-29.9) 05/12/2015   Plantar fasciitis 09/05/2013   Statin intolerance 01/11/2023   Telogen effluvium 06/01/2020    Past Surgical History:  Procedure Laterality Date   CESAREAN SECTION     2002   CHOLECYSTECTOMY  03/25/2012   Procedure: LAPAROSCOPIC CHOLECYSTECTOMY WITH INTRAOPERATIVE CHOLANGIOGRAM;  Surgeon: Currie Paris, MD;  Location: [facility] ORS;  Service: General;  Laterality: N/A;  Laparoscopic Cholecystectomy with Intraoperative Cholangiogram   COLONOSCOPY     ERCP  12/05/2011   Procedure: ENDOSCOPIC RETROGRADE CHOLANGIOPANCREATOGRAPHY (ERCP);  Surgeon: Louis Meckel, MD;  Location: [facility];  Service: Endoscopy;  Laterality: N/A;   ERCP  12/08/2011   Procedure: ENDOSCOPIC RETROGRADE CHOLANGIOPANCREATOGRAPHY (ERCP);  Surgeon: Louis Meckel, MD;  Location: [facility] OR;  Service: Gastroenterology;  Laterality: N/A;   ESOPHAGOGASTRODUODENOSCOPY (EGD) WITH PROPOFOL N/A 09/28/2015   Procedure: ESOPHAGOGASTRODUODENOSCOPY (EGD) WITH PROPOFOL;  Surgeon: Vida Rigger, MD;  Location: [facility] ENDOSCOPY;  Service: Endoscopy;  Laterality: N/A;  H AND P IN DRAWER   EUS N/A 10/13/2015   Procedure: UPPER ENDOSCOPIC ULTRASOUND (EUS) RADIAL;  Surgeon: Willis Modena, MD;  Location: [facility] ENDOSCOPY;  Service: Endoscopy;  Laterality: N/A;   LAPAROSCOPIC PARTIAL GASTRECTOMY N/A 01/11/2016   Procedure: LAPAROSCOPIC PARTIAL GASTRECTOMY;  Surgeon: Almond Lint, MD;  Location: [facility] OR;  Service: General;  Laterality: N/A;   MASTECTOMY Right 09/2015   MASTECTOMY W/ SENTINEL NODE BIOPSY Right 09/15/2015   Procedure: RIGHT MASTECTOMY WITH SENTINEL LYMPH NODE BIOPSY;  Surgeon: Emelia Loron, MD;  Location:  [facility] OR;  Service: General;  Laterality: Right;   UPPER GI ENDOSCOPY N/A 01/11/2016   Procedure: UPPER GI ENDOSCOPY;  Surgeon: Almond Lint, MD;  Location: [facility] OR;  Service: General;  Laterality: N/A;    Current Medications: Current Meds  Medication Sig   hydrochlorothiazide (HYDRODIURIL) 12.5 MG tablet Take 1 tablet (12.5 mg total) by mouth daily.   icosapent Ethyl (VASCEPA) 1 g capsule Take 2 capsules (2 g total) by mouth 2 (two) times daily.   metFORMIN (GLUCOPHAGE) 500 MG tablet Take 1 tablet (500 mg total) by mouth 2 (two) times daily with a meal.   pantoprazole (PROTONIX) 40 MG tablet Take 1 tablet (40 mg total) by mouth daily before breakfast.     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   Tobacco Use   Smoking status: Never   Smokeless tobacco: Never  Vaping Use   Vaping status: Never Used  Substance and Sexual Activity   Alcohol use: No   Drug use: No   Sexual activity: Not Currently  Other Topics Concern   Not on file  Social History Narrative   Married, husband is a Curator   2 young adult daughters   - spanish interpreter   + children   No caffeine   Social Determinants of Corporate investment banker Strain: Not on file  Food Insecurity: Not on file  Transportation Needs: Not on file  Physical Activity: Not on file  Stress: Not on file  Social Connections: Not on file     Family History: The patient's family history includes Diabetes in her brother, father, and mother; Heart disease in her paternal uncle; Liver disease in her brother. There is no history of Colon cancer, Esophageal cancer, Rectal cancer, or  Stomach cancer.  ROS:   Please see the history of present illness.    All other systems reviewed and are negative.  EKGs/Labs/Other Studies Reviewed:    The following studies were reviewed today: .Marland KitchenEKG Interpretation Date/Time:  Tuesday June 05 2023 08:32:41 EST Ventricular Rate:  80 PR Interval:  130 QRS Duration:  88 QT Interval:  384 QTC Calculation: 442 R Axis:   56  Text Interpretation: Normal sinus rhythm Cannot rule out Anterior infarct , age undetermined When compared with ECG of 21-Aug-2017 14:41, No significant change was found Confirmed by Belva Crome [phone]) on 06/05/2023 8:47:25 AM     Recent Labs: 09/12/2022: ALT 19 01/11/2023: BUN 17; Creatinine, Ser 0.60; Hemoglobin 12.7; Platelets 295; Potassium 4.1; Sodium 143; TSH 1.380  Recent Lipid Panel    Component Value Date/Time   CHOL 259 (H) 01/11/2023 0914   TRIG 455 (H) 01/11/2023 0914   HDL 47 01/11/2023 0914   CHOLHDL 5.5 (H) 01/11/2023 0914   CHOLHDL 5 05/15/2018 0938   VLDL 71.2 (H) 05/15/2018 0938   LDLCALC 130 (H) 01/11/2023 0914   LDLDIRECT 132.0 05/15/2018 0938    Physical Exam:    VS:  BP (!) 160/90   Pulse 80   Ht 5\' 2"  (1.575 m)   Wt 143 lb 0.6 oz (64.9 kg)   LMP 11/29/2011   SpO2 96%   BMI 26.16 kg/m     Wt Readings from Last 3 Encounters:  06/05/23 143 lb 0.6 oz (64.9 kg)  01/11/23 142 lb 12.8 oz (64.8 kg)  09/12/22 147 lb (66.7 kg)     GEN: Patient is in no acute distress HEENT: Normal NECK: No JVD; No carotid bruits LYMPHATICS: No lymphadenopathy CARDIAC: Hear sounds regular, 2/6 systolic murmur at the apex. RESPIRATORY:  Clear to auscultation without rales, wheezing or rhonchi  ABDOMEN: Soft, non-tender, non-distended MUSCULOSKELETAL:  No edema; No deformity  SKIN: Warm and dry NEUROLOGIC:  Alert and oriented x 3 PSYCHIATRIC:  Normal affect   Signed, Garwin Brothers, MD  06/05/2023 8:54 AM    [facility]

## 2023-06-06 ENCOUNTER — Other Ambulatory Visit (HOSPITAL_COMMUNITY): Payer: Self-pay

## 2023-06-06 LAB — LIPID PANEL
Chol/HDL Ratio: 3.1 ratio (ref 0.0–4.4)
Cholesterol, Total: 234 mg/dL — ABNORMAL HIGH (ref 100–199)
HDL: 75 mg/dL (ref >39)
LDL Chol Calc (NIH): 134 mg/dL — ABNORMAL HIGH (ref 0–99)
Triglycerides: 144 mg/dL (ref 0–149)
VLDL Cholesterol Cal: 25 mg/dL (ref 5–40)

## 2023-06-06 LAB — COMPREHENSIVE METABOLIC PANEL
ALT: 26 [IU]/L (ref 0–32)
AST: 21 [IU]/L (ref 0–40)
Albumin: 4.6 g/dL (ref 3.8–4.9)
Alkaline Phosphatase: 99 [IU]/L (ref 44–121)
BUN/Creatinine Ratio: 20 (ref 12–28)
BUN: 16 mg/dL (ref 8–27)
Bilirubin Total: 0.4 mg/dL (ref 0.0–1.2)
CO2: 26 mmol/L (ref 20–29)
Calcium: 11.3 mg/dL — ABNORMAL HIGH (ref 8.7–10.3)
Chloride: 103 mmol/L (ref 96–106)
Creatinine, Ser: 0.79 mg/dL (ref 0.57–1.00)
Globulin, Total: 2.9 g/dL (ref 1.5–4.5)
Glucose: 134 mg/dL — ABNORMAL HIGH (ref 70–99)
Potassium: 5.1 mmol/L (ref 3.5–5.2)
Sodium: 147 mmol/L — ABNORMAL HIGH (ref 134–144)
Total Protein: 7.5 g/dL (ref 6.0–8.5)
eGFR: 86 mL/min/{1.73_m2} (ref >59)

## 2023-06-07 ENCOUNTER — Encounter: Payer: Self-pay | Admitting: Hematology and Oncology

## 2023-06-07 ENCOUNTER — Other Ambulatory Visit (HOSPITAL_COMMUNITY): Payer: Self-pay

## 2023-08-07 ENCOUNTER — Ambulatory Visit (INDEPENDENT_AMBULATORY_CARE_PROVIDER_SITE_OTHER): Payer: 59 | Admitting: Orthopaedic Surgery

## 2023-08-07 ENCOUNTER — Other Ambulatory Visit (INDEPENDENT_AMBULATORY_CARE_PROVIDER_SITE_OTHER): Payer: 59

## 2023-08-07 ENCOUNTER — Other Ambulatory Visit (INDEPENDENT_AMBULATORY_CARE_PROVIDER_SITE_OTHER): Payer: Self-pay

## 2023-08-07 DIAGNOSIS — M79641 Pain in right hand: Secondary | ICD-10-CM | POA: Diagnosis not present

## 2023-08-07 DIAGNOSIS — M79642 Pain in left hand: Secondary | ICD-10-CM

## 2023-08-07 NOTE — Progress Notes (Signed)
Office Visit Note   Patient: Nichole Wilson           Date of Birth: Dec 02, 1960           MRN: [id number] Visit Date: 08/07/2023              Requested by: Arnette Felts, FNP [address] [place],  [place] [address] PCP: Arnette Felts, FNP   Assessment & Plan: Visit Diagnoses:  1. Bilateral hand pain     Plan: Nichole Wilson is a 63 year old with bilateral carpal tunnel syndrome.  We will try nighttime splinting for 2 weeks as an initial treatment.  If symptoms do not improve she will let us know.  We will need EMGs at that point.  Follow-Up Instructions: No follow-ups on file.   Orders:  Orders Placed This Encounter  Procedures   XR Hand Complete Left   XR Hand Complete Right   No orders of the defined types were placed in this encounter.     Procedures: No procedures performed   Clinical Data: No additional findings.   Subjective: Chief Complaint  Patient presents with   Right Wrist - Pain   Left Wrist - Pain   Right Hand - Pain   Left Hand - Pain    HPI Nichole Wilson comes in for evaluation of bilateral wrist pain and numbness and tingling.  Reports night symptoms and hands falling asleep while driving. Review of Systems  Constitutional: Negative.   HENT: Negative.    Eyes: Negative.   Respiratory: Negative.    Cardiovascular: Negative.   Endocrine: Negative.   Musculoskeletal: Negative.   Neurological: Negative.   Hematological: Negative.   Psychiatric/Behavioral: Negative.    All other systems reviewed and are negative.    Objective: Vital Signs: LMP 11/29/2011   Physical Exam Vitals and nursing note reviewed.  Constitutional:      Appearance: She is well-developed.  HENT:     Head: Atraumatic.     Nose: Nose normal.  Eyes:     Extraocular Movements: Extraocular movements intact.  Cardiovascular:     Pulses: Normal pulses.  Pulmonary:     Effort: Pulmonary effort is normal.  Abdominal:     Palpations: Abdomen is soft.   Musculoskeletal:     Cervical back: Neck supple.  Skin:    General: Skin is warm.     Capillary Refill: Capillary refill takes less than 2 seconds.  Neurological:     Mental Status: She is alert. Mental status is at baseline.  Psychiatric:        Behavior: Behavior normal.        Thought Content: Thought content normal.        Judgment: Judgment normal.     Ortho Exam Examination of bilateral hands show no muscle atrophy.  Positive carpal tunnel compressive signs. Specialty Comments:  No specialty comments available.  Imaging: XR Hand Complete Left Result Date: 08/07/2023 X-rays of the left hand show no acute or structural abnormalities  XR Hand Complete Right Result Date: 08/07/2023 X-rays of the right hand show no acute or structural abnormalities.    PMFS History: Patient Active Problem List   Diagnosis Date Noted   Glucose intolerance (impaired glucose tolerance)    Hyperlipidemia associated with type 2 diabetes mellitus (HCC) 01/11/2023   History of anemia 01/11/2023   Abnormal bruising 01/11/2023   Statin intolerance 01/11/2023   Aortic atherosclerosis (HCC) 06/02/2021   History of right mastectomy 04/28/2021   History of right breast  cancer 04/28/2021   Anemia    Cholelithiasis with choledocholithiasis    Family history of adverse reaction to anesthesia    Telogen effluvium 06/01/2020   Hx of adenomatous polyp of colon 12/12/2018   Iron deficiency anemia 10/25/2018   Elevated alkaline phosphatase level 08/07/2018   Hypertriglyceridemia 08/23/2017   ANA positive 08/06/2017   Essential hypertension 01/04/2016   Gastric mass GIST vs leiomyoma 12/01/2015   Anal fissure - posterior 11/26/2015   Breast cancer of upper-outer quadrant of right female breast (HCC) 08/26/2015   Anterior scleritis of both eyes 05/12/2015   Overweight (BMI 25.0-29.9) 05/12/2015   Controlled type 2 diabetes mellitus with diabetic nephropathy, without long-term current use of insulin  (HCC) 05/12/2015   Diabetes mellitus (HCC) 05/12/2015   Plantar fasciitis 09/05/2013   Health care maintenance 03/24/2013   Hypertension 01/04/2012   Hepatic steatosis 01/02/2012   Hypercholesterolemia with hypertriglyceridemia 02/24/2009   Migraine without aura 01/09/2007   Past Medical History:  Diagnosis Date   Abnormal bruising 01/11/2023   ANA positive 08/06/2017   Anal fissure - posterior 11/26/2015   Anemia    Anterior scleritis of both eyes 05/12/2015   Followed by Dr Cephus Richer - Last OV 11/10, 11/18 - Update 11/18 - dx Acute toxic conjunctivitis > improved s/p prednisolone - Primary iridocyclitis (R-eye) - begin taper of prednisolone eye drops, much improved - Requested labs all resulted 11/16: ESR (21), ANA (neg), RPR (non-reactive, no FTA-Ab) >> faxed results back to Dr Carlynn Purl    Aortic atherosclerosis (HCC) 06/02/2021   Breast cancer of upper-outer quadrant of right female breast (HCC) 08/26/2015   Cholelithiasis with choledocholithiasis    Controlled type 2 diabetes mellitus with diabetic nephropathy, without long-term current use of insulin (HCC) 05/12/2015   Diabetes mellitus (HCC) 05/12/2015   Elevated alkaline phosphatase level 08/07/2018   Normal Alk Phos Isoenzyme   Essential hypertension 01/04/2016   Family history of adverse reaction to anesthesia    father became aggressive   Gastric mass GIST 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    Health care maintenance 03/24/2013   Hepatic steatosis    History of anemia 01/11/2023   History of right breast cancer 04/28/2021   History of right mastectomy 04/28/2021   Hx of adenomatous polyp of colon 12/12/2018   Hypercholesterolemia with hypertriglyceridemia    Hyperlipidemia associated with type 2 diabetes mellitus (HCC) 01/11/2023   Hypertension 01/04/2012   med d/c-last taken 12-07-11  -PT STATES HER MEDICAL DOCTOR STOPPED HER B/P MEDICATION BECAUSE HER  B/P TOO LOW AND PT STATES HER B/P'S HAVE BEEN OK SINCE.   Hypertriglyceridemia    Iron deficiency anemia 10/25/2018   Migraine without aura 01/09/2007   Qualifier: Diagnosis of   By: Tressia Danas  MD, Adlih      IMO SNOMED Dx Update Oct 2024     Overweight (BMI 25.0-29.9) 05/12/2015   Plantar fasciitis 09/05/2013   Statin intolerance 01/11/2023   Telogen effluvium 06/01/2020    Family History  Problem Relation Age of Onset   Diabetes Mother    Diabetes Father    Diabetes Brother    Heart disease Paternal Uncle    Liver disease Brother    Colon cancer Neg Hx    Esophageal cancer Neg Hx    Rectal cancer Neg Hx    Stomach cancer Neg Hx     Past Surgical History:  Procedure Laterality Date   CESAREAN SECTION  2002   CHOLECYSTECTOMY  03/25/2012   Procedure: LAPAROSCOPIC CHOLECYSTECTOMY WITH INTRAOPERATIVE CHOLANGIOGRAM;  Surgeon: Currie Paris, MD;  Location: [facility];  Service: General;  Laterality: N/A;  Laparoscopic Cholecystectomy with Intraoperative Cholangiogram   COLONOSCOPY     ERCP  12/05/2011   Procedure: ENDOSCOPIC RETROGRADE CHOLANGIOPANCREATOGRAPHY (ERCP);  Surgeon: Louis Meckel, MD;  Location: [facility];  Service: Endoscopy;  Laterality: N/A;   ERCP  12/08/2011   Procedure: ENDOSCOPIC RETROGRADE CHOLANGIOPANCREATOGRAPHY (ERCP);  Surgeon: Louis Meckel, MD;  Location[facility] OR;  Service: Gastroenterology;  Laterality: N/A;   ESOPHAGOGASTRODUODENOSCOPY (EGD) WITH PROPOFOL N/A 09/28/2015   Procedure: ESOPHAGOGASTRODUODENOSCOPY (EGD) WITH PROPOFOL;  Surgeon: Vida Rigger, MD;  Location: [facility] ENDOSCOPY;  Service: Endoscopy;  Laterality: N/A;  H AND P IN DRAWER   EUS N/A 10/13/2015   Procedure: UPPER ENDOSCOPIC ULTRASOUND (EUS) RADIAL;  Surgeon: Willis Modena, MD;  Location: [facility] ENDOSCOPY;  Service: Endoscopy;  Laterality: N/A;   LAPAROSCOPIC PARTIAL GASTRECTOMY N/A 01/11/2016   Procedure: LAPAROSCOPIC PARTIAL GASTRECTOMY;  Surgeon: Almond Lint, MD;  Location: [facility] OR;   Service: General;  Laterality: N/A;   MASTECTOMY Right 09/2015   MASTECTOMY W/ SENTINEL NODE BIOPSY Right 09/15/2015   Procedure: RIGHT MASTECTOMY WITH SENTINEL LYMPH NODE BIOPSY;  Surgeon: Emelia Loron, MD;  Location: [facility] OR;  Service: General;  Laterality: Right;   UPPER GI ENDOSCOPY N/A 01/11/2016   Procedure: UPPER GI ENDOSCOPY;  Surgeon: Almond Lint, MD;  Location: [facility] OR;  Service: General;  Laterality: N/A;   Social History   Occupational History   Occupation: Museum/gallery curator: [employer]    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   Tobacco Use   Smoking status: Never   Smokeless tobacco: Never  Vaping Use   Vaping status: Never Used  Substance and Sexual Activity   Alcohol use: No   Drug use: No   Sexual activity: Not Currently

## 2023-08-08 ENCOUNTER — Ambulatory Visit: Payer: 59 | Admitting: Nurse Practitioner

## 2023-08-08 ENCOUNTER — Encounter: Payer: Self-pay | Admitting: Nurse Practitioner

## 2023-08-08 VITALS — BP 120/80 | HR 88 | Temp 98.6°F | Ht 62.0 in | Wt 145.2 lb

## 2023-08-08 DIAGNOSIS — Z2821 Immunization not carried out because of patient refusal: Secondary | ICD-10-CM

## 2023-08-08 DIAGNOSIS — E1169 Type 2 diabetes mellitus with other specified complication: Secondary | ICD-10-CM

## 2023-08-08 DIAGNOSIS — E1121 Type 2 diabetes mellitus with diabetic nephropathy: Secondary | ICD-10-CM | POA: Diagnosis not present

## 2023-08-08 DIAGNOSIS — Z79899 Other long term (current) drug therapy: Secondary | ICD-10-CM | POA: Diagnosis not present

## 2023-08-08 DIAGNOSIS — Z0000 Encounter for general adult medical examination without abnormal findings: Secondary | ICD-10-CM

## 2023-08-08 DIAGNOSIS — Z6826 Body mass index (BMI) 26.0-26.9, adult: Secondary | ICD-10-CM

## 2023-08-08 DIAGNOSIS — I700 Atherosclerosis of aorta: Secondary | ICD-10-CM

## 2023-08-08 DIAGNOSIS — E785 Hyperlipidemia, unspecified: Secondary | ICD-10-CM | POA: Diagnosis not present

## 2023-08-08 DIAGNOSIS — I10 Essential (primary) hypertension: Secondary | ICD-10-CM | POA: Diagnosis not present

## 2023-08-08 DIAGNOSIS — Z170 Estrogen receptor positive status [ER+]: Secondary | ICD-10-CM

## 2023-08-08 DIAGNOSIS — E663 Overweight: Secondary | ICD-10-CM

## 2023-08-08 DIAGNOSIS — C50411 Malignant neoplasm of upper-outer quadrant of right female breast: Secondary | ICD-10-CM

## 2023-08-08 DIAGNOSIS — Z789 Other specified health status: Secondary | ICD-10-CM

## 2023-08-08 HISTORY — DX: Encounter for general adult medical examination without abnormal findings: Z00.00

## 2023-08-08 HISTORY — DX: Immunization not carried out because of patient refusal: Z28.21

## 2023-08-08 LAB — POCT URINALYSIS DIP (CLINITEK)
Bilirubin, UA: NEGATIVE
Blood, UA: NEGATIVE
Glucose, UA: NEGATIVE mg/dL
Ketones, POC UA: NEGATIVE mg/dL
Leukocytes, UA: NEGATIVE
Nitrite, UA: NEGATIVE
POC PROTEIN,UA: NEGATIVE
Spec Grav, UA: 1.02 (ref 1.010–1.025)
Urobilinogen, UA: 0.2 U/dL
pH, UA: 7.5 (ref 5.0–8.0)

## 2023-08-08 NOTE — Assessment & Plan Note (Addendum)
HgbA1c was improved at last visit. Diabetic foot exam done

## 2023-08-08 NOTE — Patient Instructions (Signed)
Health Maintenance  Topic Date Due   Yearly kidney health urinalysis for diabetes  05/10/2023   Hemoglobin A1C  07/13/2023   Eye exam for diabetics  08/02/2023   COVID-19 Vaccine (5 - 2024-25 season) 08/24/2023*   Flu Shot  10/15/2023*   Pneumococcal Vaccination (2 of 2 - PCV) 08/07/2024*   Mammogram  05/05/2024   Yearly kidney function blood test for diabetes  06/04/2024   Complete foot exam   08/07/2024   Colon Cancer Screening  12/04/2025   DTaP/Tdap/Td vaccine (3 - Td or Tdap) 08/15/2026   Pap with HPV screening  09/02/2027   Hepatitis C Screening  Completed   HIV Screening  Completed   Zoster (Shingles) Vaccine  Completed   HPV Vaccine  Aged Out  *Topic was postponed. The date shown is not the original due date.

## 2023-08-08 NOTE — Assessment & Plan Note (Signed)
Blood pressure is slightly elevated diastolic, continue current medications

## 2023-08-08 NOTE — Progress Notes (Signed)
Madelaine Bhat, CMA,acting as a Neurosurgeon for Arnette Felts, FNP.,have documented all relevant documentation on the behalf of Arnette Felts, FNP,as directed by  Arnette Felts, FNP while in the presence of Arnette Felts, FNP.  Subjective:    Patient ID: Nichole Wilson , female    DOB: 15-Dec-1960 , 63 y.o.   MRN: [id number]  Chief Complaint  Patient presents with   Annual Exam    HPI  Patient presents today for HM, Patient reports compliance with medication. Patient denies any chest pain, SOB, or headaches. Patient has no concerns today. Patient previously had EKG at cardiologist. She is now on Livalo 1mg  daily. She seen Dr Roda Shutters for her carpal tunnel - she will be wearing a brace if not better she will have a nerve conduction study and injections.      Past Medical History:  Diagnosis Date   Abnormal bruising 01/11/2023   ANA positive 08/06/2017   Anal fissure - posterior 11/26/2015   Anemia    Anterior scleritis of both eyes 05/12/2015   Followed by Dr Cephus Richer - Last OV 11/10, 11/18 - Update 11/18 - dx Acute toxic conjunctivitis > improved s/p prednisolone - Primary iridocyclitis (R-eye) - begin taper of prednisolone eye drops, much improved - Requested labs all resulted 11/16: ESR (21), ANA (neg), RPR (non-reactive, no FTA-Ab) >> faxed results back to Dr Carlynn Purl    Aortic atherosclerosis (HCC) 06/02/2021   Breast cancer of upper-outer quadrant of right female breast (HCC) 08/26/2015   Cholelithiasis with choledocholithiasis    Controlled type 2 diabetes mellitus with diabetic nephropathy, without long-term current use of insulin (HCC) 05/12/2015   Diabetes mellitus (HCC) 05/12/2015   Elevated alkaline phosphatase level 08/07/2018   Normal Alk Phos Isoenzyme   Essential hypertension 01/04/2016   Family history of adverse reaction to anesthesia    father became aggressive   Gastric mass GIST 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     Health care maintenance 03/24/2013   Hepatic steatosis    History of anemia 01/11/2023   History of right breast cancer 04/28/2021   History of right mastectomy 04/28/2021   Hx of adenomatous polyp of colon 12/12/2018   Hypercholesterolemia with hypertriglyceridemia    Hyperlipidemia associated with type 2 diabetes mellitus (HCC) 01/11/2023   Hypertension 01/04/2012   med d/c-last taken 12-07-11  -PT STATES HER MEDICAL DOCTOR STOPPED HER B/P MEDICATION BECAUSE HER B/P TOO LOW AND PT STATES HER B/P'S HAVE BEEN OK SINCE.   Hypertriglyceridemia    Iron deficiency anemia 10/25/2018   Migraine without aura 01/09/2007   Qualifier: Diagnosis of   By: Tressia Danas  MD, Adlih      IMO SNOMED Dx Update Oct 2024     Overweight (BMI 25.0-29.9) 05/12/2015   Plantar fasciitis 09/05/2013   Statin intolerance 01/11/2023   Telogen effluvium 06/01/2020     Family History  Problem Relation Age of Onset   Diabetes Mother    Diabetes Father    Diabetes Brother    Heart disease Paternal Uncle    Liver disease Brother    Colon cancer Neg Hx    Esophageal cancer Neg Hx    Rectal cancer Neg Hx    Stomach cancer Neg Hx      Current Outpatient Medications:    hydrochlorothiazide (HYDRODIURIL) 12.5 MG tablet, Take 1 tablet (12.5 mg total) by mouth daily., Disp: 90 tablet, Rfl: 3   icosapent Ethyl (VASCEPA)  1 g capsule, Take 2 capsules (2 g total) by mouth 2 (two) times daily., Disp: 360 capsule, Rfl: 0   metFORMIN (GLUCOPHAGE) 500 MG tablet, Take 1 tablet (500 mg total) by mouth 2 (two) times daily with a meal., Disp: 180 tablet, Rfl: 1   pantoprazole (PROTONIX) 40 MG tablet, Take 1 tablet (40 mg total) by mouth daily before breakfast., Disp: 90 tablet, Rfl: 0   Pitavastatin Calcium (LIVALO) 1 MG TABS, Take 1 tablet (1 mg total) by mouth daily., Disp: 90 tablet, Rfl: 3   Allergies  Allergen Reactions   Lisinopril Swelling   Other     General anesthesia-severe vomiting   Zetia [Ezetimibe] Diarrhea       The patient states she uses post menopausal status for birth control. Patient's last menstrual period was 11/29/2011.. Negative for Dysmenorrhea and Negative for Menorrhagia. Negative for: breast discharge, breast lump(s), breast pain and breast self exam. Associated symptoms include abnormal vaginal bleeding. Pertinent negatives include abnormal bleeding (hematology), anxiety, decreased libido, depression, difficulty falling sleep, dyspareunia, history of infertility, nocturia, sexual dysfunction, sleep disturbances, urinary incontinence, urinary urgency, vaginal discharge and vaginal itching. Diet regular; she is cutting carbs and sugars.The patient states her exercise level is moderate - 3-4 days a week.   The patient's tobacco use is:  Social History   Tobacco Use  Smoking Status Never  Smokeless Tobacco Never   She has been exposed to passive smoke. The patient's alcohol use is:  Social History   Substance and Sexual Activity  Alcohol Use No    Review of Systems  Constitutional: Negative.   HENT: Negative.    Eyes: Negative.   Respiratory: Negative.    Cardiovascular: Negative.   Gastrointestinal: Negative.   Endocrine: Negative.   Genitourinary: Negative.   Musculoskeletal: Negative.   Skin: Negative.   Allergic/Immunologic: Negative.   Neurological: Negative.   Hematological: Negative.   Psychiatric/Behavioral: Negative.       Today's Vitals   08/08/23 1034 08/08/23 1126  BP: 120/84 120/80  Pulse: 88   Temp: 98.6 F (37 C)   TempSrc: Oral   Weight: 145 lb 3.2 oz (65.9 kg)   Height: 5\' 2"  (1.575 m)   PainSc: 10-Worst pain ever   PainLoc: Wrist    Body mass index is 26.56 kg/m.  Wt Readings from Last 3 Encounters:  08/08/23 145 lb 3.2 oz (65.9 kg)  06/05/23 143 lb 0.6 oz (64.9 kg)  01/11/23 142 lb 12.8 oz (64.8 kg)     Objective:  Physical Exam Vitals reviewed.  Constitutional:      General: She is not in acute distress.    Appearance: Normal  appearance. She is well-developed.  HENT:     Head: Normocephalic and atraumatic.     Right Ear: Hearing, tympanic membrane, ear canal and external ear normal. There is no impacted cerumen.     Left Ear: Hearing, tympanic membrane, ear canal and external ear normal. There is no impacted cerumen.     Nose: Nose normal.     Mouth/Throat:     Mouth: Mucous membranes are moist.  Eyes:     General: Lids are normal.     Extraocular Movements: Extraocular movements intact.     Conjunctiva/sclera: Conjunctivae normal.     Pupils: Pupils are equal, round, and reactive to light.     Funduscopic exam:    Right eye: No papilledema.        Left eye: No papilledema.  Neck:  Thyroid: No thyroid mass.     Vascular: No carotid bruit.  Cardiovascular:     Rate and Rhythm: Normal rate and regular rhythm.     Pulses: Normal pulses.     Heart sounds: Normal heart sounds. No murmur heard. Pulmonary:     Effort: Pulmonary effort is normal. No respiratory distress.     Breath sounds: Normal breath sounds. No wheezing.  Chest:     Chest wall: No mass.  Breasts:    Tanner Score is 5.     Breasts are asymmetrical.     Right: Absent.     Left: Normal.  Abdominal:     General: Abdomen is flat. Bowel sounds are normal. There is no distension.     Palpations: Abdomen is soft.     Tenderness: There is no abdominal tenderness.  Genitourinary:    Comments: Followed by Dr. Clearance Coots Musculoskeletal:        General: No swelling. Normal range of motion.     Cervical back: Full passive range of motion without pain, normal range of motion and neck supple.     Right lower leg: No edema.     Left lower leg: No edema.  Skin:    General: Skin is warm and dry.     Capillary Refill: Capillary refill takes less than 2 seconds.  Neurological:     General: No focal deficit present.     Mental Status: She is alert and oriented to person, place, and time.     Cranial Nerves: No cranial nerve deficit.     Sensory:  No sensory deficit.     Motor: No weakness.  Psychiatric:        Mood and Affect: Mood normal.        Behavior: Behavior normal.        Thought Content: Thought content normal.        Judgment: Judgment normal.      Title   Diabetic Foot Exam - detailed Date & Time: 08/08/2023 11:18 AM Diabetic Foot exam was performed with the following findings: Yes  Visual Foot Exam completed.: Yes  Is there a history of foot ulcer?: No Is there a foot ulcer now?: No Is there swelling?: No Is there elevated skin temperature?: No Is there abnormal foot shape?: No Is there a claw toe deformity?: No Are the toenails long?: No Are the toenails thick?: No Are the toenails ingrown?: No Is the skin thin, fragile, shiny and hairless?": No Normal Range of Motion?: Yes Is there foot or ankle muscle weakness?: No Do you have pain in calf while walking?: No Are the shoes appropriate in style and fit?: Yes Can the patient see the bottom of their feet?: Yes Pulse Foot Exam completed.: Yes   Right Posterior Tibialis: Present Left posterior Tibialis: Present   Right Dorsalis Pedis: Present Left Dorsalis Pedis: Present     Sensory Foot Exam Completed.: Yes Semmes-Weinstein Monofilament Test "+" means "has sensation" and "-" means "no sensation"   R Site 1-Great Toe: Pos L Site 1-Great Toe: Pos   R Site 4: Pos L Site 4: Pos   R Site 6: Pos L Site 6: Pos     Image components are not supported.   Image components are not supported. Image components are not supported.  [place] Comments        Assessment And Plan:     Encounter for annual health examination Assessment & Plan: Behavior modifications discussed and diet history reviewed.  Pt will continue to exercise regularly and modify diet with low GI, plant based foods and decrease intake of processed foods.  Recommend intake of daily multivitamin, Vitamin D, and calcium.  Recommend mammogram and colonoscopy for preventive screenings, as  well as recommend immunizations that include influenza, TDAP, and Shingles     Controlled type 2 diabetes mellitus with diabetic nephropathy, without long-term current use of insulin (HCC) Assessment & Plan: HgbA1c was improved at last visit. Diabetic foot exam done  Orders: -     POCT URINALYSIS DIP (CLINITEK) -     Microalbumin / creatinine urine ratio  Hyperlipidemia associated with type 2 diabetes mellitus (HCC) Assessment & Plan: Cholesterol levels have improved, continue current medications.   Orders: -     POCT URINALYSIS DIP (CLINITEK) -     Microalbumin / creatinine urine ratio -     CMP14+EGFR -     Hemoglobin A1c -     Lipid panel  Essential hypertension Assessment & Plan: Blood pressure is slightly elevated diastolic, continue current medications   COVID-19 vaccination declined Assessment & Plan: Declines covid 19 vaccine. Discussed risk of covid 79 and if she changes her mind about the vaccine to call the office. Education has been provided regarding the importance of this vaccine but patient still declined. Advised may receive this vaccine at local pharmacy or Health Dept.or vaccine clinic. Aware to provide a copy of the vaccination record if obtained from local pharmacy or Health Dept.  Encouraged to take multivitamin, vitamin d, vitamin c and zinc to increase immune system. Aware can call office if would like to have vaccine here at office. Verbalized acceptance and understanding.    Influenza vaccination declined Assessment & Plan: Patient declined influenza vaccination at this time. Patient is aware that influenza vaccine prevents illness in 70% of healthy people, and reduces hospitalizations to 30-70% in elderly. This vaccine is recommended annually. Education has been provided regarding the importance of this vaccine but patient still declined. Advised may receive this vaccine at local pharmacy or Health Dept.or vaccine clinic. Aware to provide a copy of the  vaccination record if obtained from local pharmacy or Health Dept.  Pt is willing to accept risk associated with refusing vaccination.    Overweight with body mass index (BMI) of 26 to 26.9 in adult  Other long term (current) drug therapy -     CBC with Differential/Platelet  Statin intolerance Assessment & Plan: Unable to tolerate statin, she is taking vascepa   Aortic atherosclerosis (HCC) Assessment & Plan: Continue Vascepa, unable to tolerate statin   Malignant neoplasm of upper-outer quadrant of right breast in female, estrogen receptor positive (HCC) Assessment & Plan: Continue f/u with Oncology      Return for 1 year physical, controlled DM check 4 months. Patient was given opportunity to ask questions. Patient verbalized understanding of the plan and was able to repeat key elements of the plan. All questions were answered to their satisfaction.   Arnette Felts, FNP  I, Arnette Felts, FNP, have reviewed all documentation for this visit. The documentation on 08/08/23 for the exam, diagnosis, procedures, and orders are all accurate and complete.

## 2023-08-09 LAB — HEMOGLOBIN A1C
Est. average glucose Bld gHb Est-mCnc: 154 mg/dL
Hgb A1c MFr Bld: 7 % — ABNORMAL HIGH (ref 4.8–5.6)

## 2023-08-09 LAB — CMP14+EGFR
ALT: 26 [IU]/L (ref 0–32)
AST: 22 [IU]/L (ref 0–40)
Albumin: 4.4 g/dL (ref 3.8–4.9)
Alkaline Phosphatase: 101 [IU]/L (ref 44–121)
BUN/Creatinine Ratio: 22 (ref 12–28)
BUN: 11 mg/dL (ref 8–27)
Bilirubin Total: 0.7 mg/dL (ref 0.0–1.2)
CO2: 25 mmol/L (ref 20–29)
Calcium: 9.4 mg/dL (ref 8.7–10.3)
Chloride: 101 mmol/L (ref 96–106)
Creatinine, Ser: 0.51 mg/dL — ABNORMAL LOW (ref 0.57–1.00)
Globulin, Total: 2.6 g/dL (ref 1.5–4.5)
Glucose: 117 mg/dL — ABNORMAL HIGH (ref 70–99)
Potassium: 4.1 mmol/L (ref 3.5–5.2)
Sodium: 140 mmol/L (ref 134–144)
Total Protein: 7 g/dL (ref 6.0–8.5)
eGFR: 107 mL/min/{1.73_m2} (ref >59)

## 2023-08-09 LAB — CBC WITH DIFFERENTIAL/PLATELET
Basophils Absolute: 0 10*3/uL (ref 0.0–0.2)
Basos: 1 %
EOS (ABSOLUTE): 0.1 10*3/uL (ref 0.0–0.4)
Eos: 1 %
Hematocrit: 40.1 % (ref 34.0–46.6)
Hemoglobin: 13.2 g/dL (ref 11.1–15.9)
Immature Grans (Abs): 0 10*3/uL (ref 0.0–0.1)
Immature Granulocytes: 0 %
Lymphocytes Absolute: 2 10*3/uL (ref 0.7–3.1)
Lymphs: 38 %
MCH: 31.4 pg (ref 26.6–33.0)
MCHC: 32.9 g/dL (ref 31.5–35.7)
MCV: 96 fL (ref 79–97)
Monocytes Absolute: 0.4 10*3/uL (ref 0.1–0.9)
Monocytes: 7 %
Neutrophils Absolute: 2.8 10*3/uL (ref 1.4–7.0)
Neutrophils: 53 %
Platelets: 282 10*3/uL (ref 150–450)
RBC: 4.2 x10E6/uL (ref 3.77–5.28)
RDW: 12.7 % (ref 11.7–15.4)
WBC: 5.4 10*3/uL (ref 3.4–10.8)

## 2023-08-09 LAB — LIPID PANEL
Chol/HDL Ratio: 4.9 {ratio} — ABNORMAL HIGH (ref 0.0–4.4)
Cholesterol, Total: 253 mg/dL — ABNORMAL HIGH (ref 100–199)
HDL: 52 mg/dL (ref >39)
LDL Chol Calc (NIH): 119 mg/dL — ABNORMAL HIGH (ref 0–99)
Triglycerides: 470 mg/dL — ABNORMAL HIGH (ref 0–149)
VLDL Cholesterol Cal: 82 mg/dL — ABNORMAL HIGH (ref 5–40)

## 2023-08-09 LAB — MICROALBUMIN / CREATININE URINE RATIO
Creatinine, Urine: 61.7 mg/dL
Microalb/Creat Ratio: 6 mg/g{creat} (ref 0–29)
Microalbumin, Urine: 3.7 ug/mL

## 2023-08-16 ENCOUNTER — Encounter: Payer: Self-pay | Admitting: Nurse Practitioner

## 2023-08-16 NOTE — Assessment & Plan Note (Signed)

## 2023-08-16 NOTE — Assessment & Plan Note (Signed)
Cholesterol levels have improved, continue current medications.

## 2023-08-16 NOTE — Assessment & Plan Note (Signed)
Continue Vascepa, unable to tolerate statin

## 2023-08-16 NOTE — Assessment & Plan Note (Signed)

## 2023-08-16 NOTE — Assessment & Plan Note (Signed)
Unable to tolerate statin, she is taking vascepa

## 2023-08-16 NOTE — Assessment & Plan Note (Signed)

## 2023-08-16 NOTE — Assessment & Plan Note (Signed)
Continue f/u with Oncology

## 2023-08-29 ENCOUNTER — Other Ambulatory Visit: Payer: Self-pay | Admitting: Nurse Practitioner

## 2023-08-29 ENCOUNTER — Other Ambulatory Visit: Payer: Self-pay | Admitting: Cardiology

## 2023-08-29 ENCOUNTER — Other Ambulatory Visit (HOSPITAL_COMMUNITY): Payer: Self-pay

## 2023-08-29 DIAGNOSIS — E1121 Type 2 diabetes mellitus with diabetic nephropathy: Secondary | ICD-10-CM

## 2023-08-29 DIAGNOSIS — E782 Mixed hyperlipidemia: Secondary | ICD-10-CM

## 2023-08-29 DIAGNOSIS — E781 Pure hyperglyceridemia: Secondary | ICD-10-CM

## 2023-08-29 MED ORDER — ICOSAPENT ETHYL 1 G PO CAPS
2.0000 g | ORAL_CAPSULE | Freq: Two times a day (BID) | ORAL | 1 refills | Status: DC
  Filled 2023-08-29: qty 360, 90d supply, fill #0
  Filled 2024-03-04: qty 360, 90d supply, fill #1

## 2023-08-30 ENCOUNTER — Other Ambulatory Visit (HOSPITAL_COMMUNITY): Payer: Self-pay

## 2023-08-30 MED ORDER — METFORMIN HCL 500 MG PO TABS
500.0000 mg | ORAL_TABLET | Freq: Two times a day (BID) | ORAL | 1 refills | Status: AC
  Filled 2023-08-30: qty 180, 90d supply, fill #0
  Filled 2024-07-08 (×2): qty 180, 90d supply, fill #1

## 2023-09-03 ENCOUNTER — Other Ambulatory Visit (HOSPITAL_COMMUNITY): Payer: Self-pay

## 2023-09-04 ENCOUNTER — Other Ambulatory Visit (HOSPITAL_COMMUNITY): Payer: Self-pay

## 2023-09-07 ENCOUNTER — Other Ambulatory Visit (HOSPITAL_COMMUNITY): Payer: Self-pay

## 2023-09-17 ENCOUNTER — Encounter: Payer: Self-pay | Admitting: Nurse Practitioner

## 2023-09-17 DIAGNOSIS — H52223 Regular astigmatism, bilateral: Secondary | ICD-10-CM | POA: Diagnosis not present

## 2023-09-17 DIAGNOSIS — E119 Type 2 diabetes mellitus without complications: Secondary | ICD-10-CM | POA: Diagnosis not present

## 2023-09-17 LAB — HM DIABETES EYE EXAM

## 2023-09-21 ENCOUNTER — Other Ambulatory Visit (HOSPITAL_COMMUNITY): Payer: Self-pay

## 2023-09-24 ENCOUNTER — Other Ambulatory Visit (HOSPITAL_COMMUNITY): Payer: Self-pay

## 2023-12-05 ENCOUNTER — Ambulatory Visit: Admitting: Nurse Practitioner

## 2023-12-05 ENCOUNTER — Encounter: Payer: Self-pay | Admitting: Nurse Practitioner

## 2023-12-05 ENCOUNTER — Other Ambulatory Visit (HOSPITAL_COMMUNITY): Payer: Self-pay

## 2023-12-05 VITALS — BP 126/80 | HR 93 | Temp 98.3°F | Wt 145.4 lb

## 2023-12-05 DIAGNOSIS — I10 Essential (primary) hypertension: Secondary | ICD-10-CM | POA: Diagnosis not present

## 2023-12-05 DIAGNOSIS — E785 Hyperlipidemia, unspecified: Secondary | ICD-10-CM | POA: Diagnosis not present

## 2023-12-05 DIAGNOSIS — I119 Hypertensive heart disease without heart failure: Secondary | ICD-10-CM | POA: Diagnosis not present

## 2023-12-05 DIAGNOSIS — E1169 Type 2 diabetes mellitus with other specified complication: Secondary | ICD-10-CM

## 2023-12-05 DIAGNOSIS — B351 Tinea unguium: Secondary | ICD-10-CM

## 2023-12-05 DIAGNOSIS — I700 Atherosclerosis of aorta: Secondary | ICD-10-CM

## 2023-12-05 DIAGNOSIS — E1121 Type 2 diabetes mellitus with diabetic nephropathy: Secondary | ICD-10-CM | POA: Diagnosis not present

## 2023-12-05 DIAGNOSIS — Z789 Other specified health status: Secondary | ICD-10-CM

## 2023-12-05 HISTORY — DX: Tinea unguium: B35.1

## 2023-12-05 MED ORDER — CICLOPIROX 8 % EX SOLN
Freq: Every day | CUTANEOUS | 0 refills | Status: DC
  Filled 2023-12-05: qty 6.6, 30d supply, fill #0

## 2023-12-05 NOTE — Assessment & Plan Note (Signed)
 Cholesterol levels are increased she is advised she has 2 refills left on her cholesterol medications. Also she is to f/u with Cardiology. Will recheck lipid panel.

## 2023-12-05 NOTE — Assessment & Plan Note (Signed)
 Continue medications as prescribed, increase physical activity.  Follow low salt, low fat diet and monitoring sugar and carbohydrate intake.

## 2023-12-05 NOTE — Assessment & Plan Note (Signed)
 Continue metfomin as ordered.

## 2023-12-05 NOTE — Progress Notes (Signed)
 I,Jameka J Llittleton, CMA,acting as a Neurosurgeon for [employer], FNP.,have documented all relevant documentation on the behalf of Susanna Epley, FNP,as directed by  Susanna Epley, FNP while in the presence of Susanna Epley, FNP.  Subjective:  Patient ID: Nichole Wilson , female    DOB: 10-28-1960 , 63 y.o.   MRN: [id number]  Chief Complaint  Patient presents with   Diabetes    Patient presents today for a bp and dm follow up, Patient reports compliance with medication. Patient denies any chest pain, SOB, or headaches. Patient wants a referral to podiatrist     HPI  She continues to decrease carbohydrates and sugar intake.  Walking 30 minutes a day 5 days a week, and Zumba twice a week.  She is working on increasing her water intake, drinks 48 oz per day.  Sees cardiology annually.    Endorses discoloration of right great toe, yellowing of the nail that has not healed with normal natural treatments to include with Tetri oil.  Has had this issues for over a year It is not painful, normal cap refill, no exudate expressed.    Diabetes She presents for her follow-up diabetic visit. She has type 2 diabetes mellitus. No MedicAlert identification noted. Her disease course has been stable. Pertinent negatives for hypoglycemia include no dizziness or headaches. There are no diabetic associated symptoms. Pertinent negatives for diabetes include no chest pain, no fatigue, no polydipsia, no polyphagia and no polyuria. Pertinent negatives for hypoglycemia complications include no blackouts and no required assistance. Symptoms are stable. There are no diabetic complications. Pertinent negatives for diabetic complications include no heart disease. Risk factors for coronary artery disease include diabetes mellitus, obesity, dyslipidemia and family history. Current diabetic treatment includes diet and oral agent (monotherapy). She is compliant with treatment all of the time. Her weight is stable. She is following a  diabetic and generally healthy diet. Meal planning includes avoidance of concentrated sweets and carbohydrate counting. She has not had a previous visit with a dietitian. She participates in exercise daily. There is no change in her home blood glucose trend. An ACE inhibitor/angiotensin II receptor blocker is not being taken. She does not see a podiatrist.Eye exam is current.  Hypertension This is a chronic problem. The current episode started more than 1 year ago. The problem has been gradually worsening since onset. The problem is uncontrolled. Pertinent negatives include no anxiety, chest pain, headaches or palpitations. There are no associated agents to hypertension. Risk factors for coronary artery disease include diabetes mellitus, obesity and dyslipidemia. Past treatments include nothing. The current treatment provides significant improvement. There are no compliance problems.  There is no history of angina or kidney disease. There is no history of chronic renal disease.     Past Medical History:  Diagnosis Date   Abnormal bruising 01/11/2023   ANA positive 08/06/2017   Anal fissure - posterior 11/26/2015   Anemia    Anterior scleritis of both eyes 05/12/2015   Followed by Dr Linnell Richardson - Last OV 11/10, 11/18 - Update 11/18 - dx Acute toxic conjunctivitis > improved s/p prednisolone - Primary iridocyclitis (R-eye) - begin taper of prednisolone eye drops, much improved - Requested labs all resulted 11/16: ESR (21), ANA (neg), RPR (non-reactive, no FTA-Ab) >> faxed results back to Dr Esaw Heckler    Aortic atherosclerosis [facility]) 06/02/2021   Breast cancer of upper-outer quadrant of right female breast (HCC) 08/26/2015   Cholelithiasis with choledocholithiasis    Controlled type 2 diabetes mellitus  with diabetic nephropathy, without long-term current use of insulin  (HCC) 05/12/2015   Diabetes mellitus (HCC) 05/12/2015   Elevated alkaline phosphatase level 08/07/2018   Normal Alk Phos Isoenzyme    Essential hypertension 01/04/2016   Family history of adverse reaction to anesthesia    father became aggressive   Gastric mass GIST 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    Health care maintenance 03/24/2013   Hepatic steatosis    History of anemia 01/11/2023   History of right breast cancer 04/28/2021   History of right mastectomy 04/28/2021   Hx of adenomatous polyp of colon 12/12/2018   Hypercholesterolemia with hypertriglyceridemia    Hyperlipidemia associated with type 2 diabetes mellitus (HCC) 01/11/2023   Hypertension 01/04/2012   med d/c-last taken 12-07-11  -PT STATES HER MEDICAL DOCTOR STOPPED HER B/P MEDICATION BECAUSE HER B/P TOO LOW AND PT STATES HER B/P'S HAVE BEEN OK SINCE.   Hypertriglyceridemia    Iron deficiency anemia 10/25/2018   Migraine without aura 01/09/2007   Qualifier: Diagnosis of   By: Beverlee Bucco  MD, Adlih      IMO SNOMED Dx Update Oct 2024     Overweight (BMI 25.0-29.9) 05/12/2015   Plantar fasciitis 09/05/2013   Statin intolerance 01/11/2023   Telogen effluvium 06/01/2020     Family History  Problem Relation Age of Onset   Diabetes Mother    Diabetes Father    Diabetes Brother    Heart disease Paternal Uncle    Liver disease Brother    Colon cancer Neg Hx    Esophageal cancer Neg Hx    Rectal cancer Neg Hx    Stomach cancer Neg Hx      Current Outpatient Medications:    ciclopirox (PENLAC) 8 % solution, Apply topically at bedtime. Apply over nail and surrounding skin. Apply daily over previous coat. After seven (7) days, remove with alcohol and continue cycle., Disp: 6.6 mL, Rfl: 0   hydrochlorothiazide  (HYDRODIURIL ) 12.5 MG tablet, Take 1 tablet (12.5 mg total) by mouth daily., Disp: 90 tablet, Rfl: 3   icosapent  Ethyl (VASCEPA ) 1 g capsule, Take 2 capsules (2 g total) by mouth 2 (two) times daily., Disp: 360 capsule, Rfl: 1   metFORMIN  (GLUCOPHAGE ) 500 MG tablet, Take 1 tablet  (500 mg total) by mouth 2 (two) times daily with a meal., Disp: 180 tablet, Rfl: 1   pantoprazole  (PROTONIX ) 40 MG tablet, Take 1 tablet (40 mg total) by mouth daily before breakfast. (Patient not taking: Reported on 12/05/2023), Disp: 90 tablet, Rfl: 0   Pitavastatin  Calcium  (LIVALO ) 1 MG TABS, Take 1 tablet (1 mg total) by mouth daily. (Patient not taking: Reported on 12/05/2023), Disp: 90 tablet, Rfl: 3   Allergies  Allergen Reactions   Lisinopril  Swelling   Other     General anesthesia-severe vomiting   Zetia  [Ezetimibe ] Diarrhea     Review of Systems  Constitutional:  Negative for fatigue.  Respiratory: Negative.    Cardiovascular:  Negative for chest pain and palpitations.  Endocrine: Negative for polydipsia, polyphagia and polyuria.  Neurological:  Negative for dizziness, numbness and headaches.  Psychiatric/Behavioral: Negative.       Today's Vitals   12/05/23 0858  BP: 126/80  Pulse: 93  Temp: 98.3 F (36.8 C)  SpO2: 98%  Weight: 145 lb 6.4 oz (66 kg)   Body mass index is 26.59 kg/m.     Objective:  Physical Exam Vitals and nursing note reviewed.  Constitutional:      General: She is not in acute distress.    Appearance: Normal appearance. She is normal weight.  Cardiovascular:     Rate and Rhythm: Normal rate and regular rhythm.     Pulses: Normal pulses.     Heart sounds: Normal heart sounds. No murmur heard. Pulmonary:     Effort: Pulmonary effort is normal. No respiratory distress.     Breath sounds: Normal breath sounds. No wheezing.  Musculoskeletal:     Right wrist: No swelling or deformity. Normal pulse.     Left wrist: No swelling, deformity or tenderness. Normal range of motion. Normal pulse.     Cervical back: Normal range of motion and neck supple.  Feet:     Right foot:     Toenail Condition: Right toenails are ingrown. Fungal disease present.    Left foot:     Toenail Condition: Left toenails are normal.  Skin:    General: Skin is warm and  dry.     Capillary Refill: Capillary refill takes less than 2 seconds.     Findings: No rash.     Comments: Left great toe with yellow discoloration to the corner of nailbed.   Neurological:     General: No focal deficit present.     Mental Status: She is alert and oriented to person, place, and time.     Cranial Nerves: No cranial nerve deficit.     Motor: No weakness.  Psychiatric:        Mood and Affect: Mood normal.        Behavior: Behavior normal.        Thought Content: Thought content normal.        Judgment: Judgment normal.         Assessment And Plan:  Controlled type 2 diabetes mellitus with diabetic nephropathy, without long-term current use of insulin  (HCC) Assessment & Plan: Continue metfomin as ordered.    Orders: -     Hemoglobin A1c  Hypertensive heart disease without CHF Assessment & Plan: Continue medications as prescribed, increase physical activity.  Follow low salt, low fat diet and monitoring sugar and carbohydrate intake.    Orders: -     BMP8+eGFR  Hyperlipidemia associated with type 2 diabetes mellitus (HCC) Assessment & Plan: Cholesterol levels are increased she is advised she has 2 refills left on her cholesterol medications. Also she is to f/u with Cardiology. Will recheck lipid panel.  Orders: -     Lipid panel  Aortic atherosclerosis (HCC) Assessment & Plan: She is statin intolerant but is taking Livalo    Statin intolerance  Toenail fungus Assessment & Plan: Yellow/white discoloration to left great toe to the left corner. Will treat with [place] and refer to podiatry at patient request.   Orders: -     Ciclopirox; Apply topically at bedtime. Apply over nail and surrounding skin. Apply daily over previous coat. After seven (7) days, remove with alcohol and continue cycle.  Dispense: 6.6 mL; Refill: 0 -     Ambulatory referral to Podiatry    Return for controlled DM check 4 months.  Patient was given opportunity to ask questions.  Patient verbalized understanding of the plan and was able to repeat key elements of the plan. All questions were answered to their satisfaction.   I have reviewed this encounter including the documentation in this note and/or discussed this patient with the Mickael Alamo FNP Student. I am certifying that I agree with the content  of this note as the primary care nurse practitioner.  Inge Mangle, FNP, have reviewed all documentation for this visit. The documentation on 12/05/23 for the exam, diagnosis, procedures, and orders are all accurate and complete.   IF YOU HAVE BEEN REFERRED TO A SPECIALIST, IT MAY TAKE 1-2 WEEKS TO SCHEDULE/PROCESS THE REFERRAL. IF YOU HAVE NOT HEARD FROM US /SPECIALIST IN TWO WEEKS, PLEASE GIVE US  A CALL AT [phone].

## 2023-12-05 NOTE — Assessment & Plan Note (Signed)
 She is statin intolerant but is taking Livalo 

## 2023-12-05 NOTE — Assessment & Plan Note (Signed)
 Yellow/white discoloration to left great toe to the left corner. Will treat with Penlac and refer to podiatry at patient request.

## 2023-12-06 ENCOUNTER — Ambulatory Visit: Payer: Self-pay | Admitting: Nurse Practitioner

## 2023-12-06 ENCOUNTER — Ambulatory Visit: Payer: 59 | Admitting: Nurse Practitioner

## 2023-12-06 LAB — BMP8+EGFR
BUN/Creatinine Ratio: 25 (ref 12–28)
BUN: 15 mg/dL (ref 8–27)
CO2: 23 mmol/L (ref 20–29)
Calcium: 9.7 mg/dL (ref 8.7–10.3)
Chloride: 101 mmol/L (ref 96–106)
Creatinine, Ser: 0.59 mg/dL (ref 0.57–1.00)
Glucose: 138 mg/dL — ABNORMAL HIGH (ref 70–99)
Potassium: 4.1 mmol/L (ref 3.5–5.2)
Sodium: 141 mmol/L (ref 134–144)
eGFR: 103 mL/min/{1.73_m2} (ref >59)

## 2023-12-06 LAB — LIPID PANEL
Chol/HDL Ratio: 4.3 ratio (ref 0.0–4.4)
Cholesterol, Total: 239 mg/dL — ABNORMAL HIGH (ref 100–199)
HDL: 55 mg/dL (ref >39)
LDL Chol Calc (NIH): 131 mg/dL — ABNORMAL HIGH (ref 0–99)
Triglycerides: 300 mg/dL — ABNORMAL HIGH (ref 0–149)
VLDL Cholesterol Cal: 53 mg/dL — ABNORMAL HIGH (ref 5–40)

## 2023-12-06 LAB — HEMOGLOBIN A1C
Est. average glucose Bld gHb Est-mCnc: 154 mg/dL
Hgb A1c MFr Bld: 7 % — ABNORMAL HIGH (ref 4.8–5.6)

## 2023-12-25 ENCOUNTER — Ambulatory Visit (INDEPENDENT_AMBULATORY_CARE_PROVIDER_SITE_OTHER): Admitting: Podiatry

## 2023-12-25 DIAGNOSIS — M79676 Pain in unspecified toe(s): Secondary | ICD-10-CM | POA: Diagnosis not present

## 2023-12-25 DIAGNOSIS — B351 Tinea unguium: Secondary | ICD-10-CM | POA: Diagnosis not present

## 2023-12-25 NOTE — Progress Notes (Signed)
 Subjective:  Patient ID: Nichole Wilson, female    DOB: March 26, 1961,  MRN: [id number] HPI Chief Complaint  Patient presents with   Nail Problem    Right 1st nail. Using Penlac  solution. No other topical treatments tried. Last A1c: 7.0. No anticog.     63 y.o. female presents with the above complaint.   ROS: Denies fever chills nausea by muscle aches pains calf pain back pain chest pain shortness of breath.  Past Medical History:  Diagnosis Date   Abnormal bruising 01/11/2023   ANA positive 08/06/2017   Anal fissure - posterior 11/26/2015   Anemia    Anterior scleritis of both eyes 05/12/2015   Followed by Dr Linnell Richardson - Last OV 11/10, 11/18 - Update 11/18 - dx Acute toxic conjunctivitis > improved s/p prednisolone - Primary iridocyclitis (R-eye) - begin taper of prednisolone eye drops, much improved - Requested labs all resulted 11/16: ESR (21), ANA (neg), RPR (non-reactive, no FTA-Ab) >> faxed results back to Dr Esaw Heckler    Aortic atherosclerosis (HCC) 06/02/2021   Breast cancer of upper-outer quadrant of right female breast (HCC) 08/26/2015   Cholelithiasis with choledocholithiasis    Controlled type 2 diabetes mellitus with diabetic nephropathy, without long-term current use of insulin  (HCC) 05/12/2015   Diabetes mellitus (HCC) 05/12/2015   Elevated alkaline phosphatase level 08/07/2018   Normal Alk Phos Isoenzyme   Essential hypertension 01/04/2016   Family history of adverse reaction to anesthesia    father became aggressive   Gastric mass GIST 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    Health care maintenance 03/24/2013   Hepatic steatosis    History of anemia 01/11/2023   History of right breast cancer 04/28/2021   History of right mastectomy 04/28/2021   Hx of adenomatous polyp of colon 12/12/2018   Hypercholesterolemia with hypertriglyceridemia    Hyperlipidemia associated with type 2 diabetes mellitus (HCC)  01/11/2023   Hypertension 01/04/2012   med d/c-last taken 12-07-11  -PT STATES HER MEDICAL DOCTOR STOPPED HER B/P MEDICATION BECAUSE HER B/P TOO LOW AND PT STATES HER B/P'S HAVE BEEN OK SINCE.   Hypertriglyceridemia    Iron deficiency anemia 10/25/2018   Migraine without aura 01/09/2007   Qualifier: Diagnosis of   By: Beverlee Bucco  MD, Adlih      IMO SNOMED Dx Update Oct 2024     Overweight (BMI 25.0-29.9) 05/12/2015   Plantar fasciitis 09/05/2013   Statin intolerance 01/11/2023   Telogen effluvium 06/01/2020   Past Surgical History:  Procedure Laterality Date   CESAREAN SECTION     2002   CHOLECYSTECTOMY  03/25/2012   Procedure: LAPAROSCOPIC CHOLECYSTECTOMY WITH INTRAOPERATIVE CHOLANGIOGRAM;  Surgeon: Darcella Earnest, MD;  Location: [facility];  Service: General;  Laterality: N/A;  Laparoscopic Cholecystectomy with Intraoperative Cholangiogram   COLONOSCOPY     ERCP  12/05/2011   Procedure: ENDOSCOPIC RETROGRADE CHOLANGIOPANCREATOGRAPHY (ERCP);  Surgeon: Claudette Cue, MD;  Location: [facility];  Service: Endoscopy;  Laterality: N/A;   ERCP  12/08/2011   Procedure: ENDOSCOPIC RETROGRADE CHOLANGIOPANCREATOGRAPHY (ERCP);  Surgeon: Claudette Cue, MD;  Location: [facility];  Service: Gastroenterology;  Laterality: N/A;   ESOPHAGOGASTRODUODENOSCOPY (EGD) WITH PROPOFOL  N/A 09/28/2015   Procedure: ESOPHAGOGASTRODUODENOSCOPY (EGD) WITH PROPOFOL ;  Surgeon: Ozell Blunt, MD;  Location: [facility]COPY;  Service: Endoscopy;  Laterality: N/A;  H AND P IN DRAWER   EUS N/A 10/13/2015   Procedure: UPPER ENDOSCOPIC ULTRASOUND (EUS) RADIAL;  Surgeon: Evangeline Hilts,  MD;  Location: [facility];  Service: Endoscopy;  Laterality: N/A;   LAPAROSCOPIC PARTIAL GASTRECTOMY N/A 01/11/2016   Procedure: LAPAROSCOPIC PARTIAL GASTRECTOMY;  Surgeon: Lockie Rima, MD;  Location: [facility] OR;  Service: General;  Laterality: N/A;   MASTECTOMY Right 09/2015   MASTECTOMY W/ SENTINEL NODE BIOPSY Right 09/15/2015   Procedure: RIGHT  MASTECTOMY WITH SENTINEL LYMPH NODE BIOPSY;  Surgeon: Enid Harry, MD;  Location: [facility] OR;  Service: General;  Laterality: Right;   UPPER GI ENDOSCOPY N/A 01/11/2016   Procedure: UPPER GI ENDOSCOPY;  Surgeon: Lockie Rima, MD;  Location: [facility] OR;  Service: General;  Laterality: N/A;    Current Outpatient Medications:    ciclopirox  (PENLAC ) 8 % solution, Apply topically at bedtime. Apply over nail and surrounding skin. Apply daily over previous coat. After seven (7) days, remove with alcohol and continue cycle., Disp: 6.6 mL, Rfl: 0   hydrochlorothiazide  (HYDRODIURIL ) 12.5 MG tablet, Take 1 tablet (12.5 mg total) by mouth daily., Disp: 90 tablet, Rfl: 3   icosapent  Ethyl (VASCEPA ) 1 g capsule, Take 2 capsules (2 g total) by mouth 2 (two) times daily., Disp: 360 capsule, Rfl: 1   metFORMIN  (GLUCOPHAGE ) 500 MG tablet, Take 1 tablet (500 mg total) by mouth 2 (two) times daily with a meal., Disp: 180 tablet, Rfl: 1   pantoprazole  (PROTONIX ) 40 MG tablet, Take 1 tablet (40 mg total) by mouth daily before breakfast., Disp: 90 tablet, Rfl: 0   Pitavastatin  Calcium  (LIVALO ) 1 MG TABS, Take 1 tablet (1 mg total) by mouth daily., Disp: 90 tablet, Rfl: 3  Allergies  Allergen Reactions   Lisinopril  Swelling   Other     General anesthesia-severe vomiting   Zetia  [Ezetimibe ] Diarrhea   Review of Systems Objective:  There were no vitals filed for this visit.  General: Well developed, nourished, in no acute distress, alert and oriented x3   Dermatological: Skin is warm, dry and supple bilateral. Nails x 10 are well maintained; remaining integument appears unremarkable at this time. There are no open sores, no preulcerative lesions, no rash or signs of infection present.  Nail dystrophy cannot rule out onychomycosis distal medial border hallux right  Vascular: Dorsalis Pedis artery and Posterior Tibial artery pedal pulses are 2/4 bilateral with immedate capillary fill time. Pedal hair growth present. No  varicosities and no lower extremity edema present bilateral.   Neruologic: Grossly intact via light touch bilateral. Vibratory intact via tuning fork bilateral. Protective threshold with Semmes Wienstein monofilament intact to all pedal sites bilateral. Patellar and Achilles deep tendon reflexes 2+ bilateral. No Babinski or clonus noted bilateral.   Musculoskeletal: No gross boney pedal deformities bilateral. No pain, crepitus, or limitation noted with foot and ankle range of motion bilateral. Muscular strength 5/5 in all groups tested bilateral.  Gait: Unassisted, Nonantalgic.    Radiographs:  None taken  Assessment & Plan:   Assessment: Mild superficial onychomycosis distal medial hallux right  Plan: Continue ciclopirox .     Urvi Imes T. [place], [place]

## 2023-12-28 ENCOUNTER — Other Ambulatory Visit (HOSPITAL_COMMUNITY): Payer: Self-pay

## 2023-12-28 ENCOUNTER — Other Ambulatory Visit: Payer: Self-pay

## 2023-12-29 ENCOUNTER — Other Ambulatory Visit: Payer: Self-pay

## 2024-01-03 ENCOUNTER — Other Ambulatory Visit: Payer: Self-pay | Admitting: Hematology and Oncology

## 2024-01-03 DIAGNOSIS — Z1231 Encounter for screening mammogram for malignant neoplasm of breast: Secondary | ICD-10-CM

## 2024-01-11 ENCOUNTER — Other Ambulatory Visit (HOSPITAL_COMMUNITY): Payer: Self-pay

## 2024-01-17 ENCOUNTER — Other Ambulatory Visit (HOSPITAL_COMMUNITY): Payer: Self-pay

## 2024-01-23 ENCOUNTER — Ambulatory Visit
Admission: RE | Admit: 2024-01-23 | Discharge: 2024-01-23 | Disposition: A | Discharge status: Home / Self Care | Source: Ambulatory Visit | Attending: Hematology and Oncology

## 2024-01-23 DIAGNOSIS — Z1231 Encounter for screening mammogram for malignant neoplasm of breast: Secondary | ICD-10-CM

## 2024-01-29 ENCOUNTER — Other Ambulatory Visit: Payer: Self-pay | Admitting: Hematology and Oncology

## 2024-01-29 DIAGNOSIS — R928 Other abnormal and inconclusive findings on diagnostic imaging of breast: Secondary | ICD-10-CM

## 2024-01-30 ENCOUNTER — Other Ambulatory Visit (HOSPITAL_COMMUNITY): Payer: Self-pay

## 2024-01-30 ENCOUNTER — Other Ambulatory Visit: Payer: Self-pay | Admitting: Hematology and Oncology

## 2024-01-30 MED ORDER — TRIAMCINOLONE ACETONIDE 0.5 % EX OINT
1.0000 | TOPICAL_OINTMENT | Freq: Two times a day (BID) | CUTANEOUS | 0 refills | Status: AC
  Filled 2024-01-30: qty 30, 15d supply, fill #0

## 2024-01-30 MED ORDER — METHYLPREDNISOLONE 4 MG PO TBPK
ORAL_TABLET | ORAL | 0 refills | Status: DC
  Filled 2024-01-30: qty 21, 6d supply, fill #0

## 2024-02-11 ENCOUNTER — Ambulatory Visit
Admission: RE | Admit: 2024-02-11 | Discharge: 2024-02-11 | Disposition: A | Discharge status: Home / Self Care | Source: Ambulatory Visit | Attending: Hematology and Oncology

## 2024-02-11 DIAGNOSIS — R928 Other abnormal and inconclusive findings on diagnostic imaging of breast: Secondary | ICD-10-CM

## 2024-02-11 DIAGNOSIS — R92332 Mammographic heterogeneous density, left breast: Secondary | ICD-10-CM | POA: Diagnosis not present

## 2024-02-11 DIAGNOSIS — R922 Inconclusive mammogram: Secondary | ICD-10-CM | POA: Diagnosis not present

## 2024-02-11 DIAGNOSIS — N6323 Unspecified lump in the left breast, lower outer quadrant: Secondary | ICD-10-CM | POA: Diagnosis not present

## 2024-02-11 DIAGNOSIS — N6002 Solitary cyst of left breast: Secondary | ICD-10-CM | POA: Diagnosis not present

## 2024-03-04 ENCOUNTER — Other Ambulatory Visit (HOSPITAL_COMMUNITY): Payer: Self-pay

## 2024-03-28 ENCOUNTER — Other Ambulatory Visit (HOSPITAL_BASED_OUTPATIENT_CLINIC_OR_DEPARTMENT_OTHER): Payer: Self-pay

## 2024-03-31 ENCOUNTER — Other Ambulatory Visit (HOSPITAL_BASED_OUTPATIENT_CLINIC_OR_DEPARTMENT_OTHER): Payer: Self-pay

## 2024-04-09 ENCOUNTER — Other Ambulatory Visit (HOSPITAL_BASED_OUTPATIENT_CLINIC_OR_DEPARTMENT_OTHER): Payer: Self-pay

## 2024-04-09 ENCOUNTER — Encounter: Payer: Self-pay | Admitting: Hematology and Oncology

## 2024-04-09 MED ORDER — FLUZONE 0.5 ML IM SUSY
0.5000 mL | PREFILLED_SYRINGE | Freq: Once | INTRAMUSCULAR | 0 refills | Status: AC
  Filled 2024-04-09: qty 0.5, 1d supply, fill #0

## 2024-04-10 ENCOUNTER — Ambulatory Visit: Admitting: Nurse Practitioner

## 2024-04-10 ENCOUNTER — Encounter: Payer: Self-pay | Admitting: Nurse Practitioner

## 2024-04-10 VITALS — BP 130/80 | HR 99 | Temp 98.4°F | Ht 62.0 in | Wt 150.0 lb

## 2024-04-10 DIAGNOSIS — K760 Fatty (change of) liver, not elsewhere classified: Secondary | ICD-10-CM

## 2024-04-10 DIAGNOSIS — Z789 Other specified health status: Secondary | ICD-10-CM

## 2024-04-10 DIAGNOSIS — I700 Atherosclerosis of aorta: Secondary | ICD-10-CM

## 2024-04-10 DIAGNOSIS — R1011 Right upper quadrant pain: Secondary | ICD-10-CM

## 2024-04-10 DIAGNOSIS — Z6827 Body mass index (BMI) 27.0-27.9, adult: Secondary | ICD-10-CM | POA: Diagnosis not present

## 2024-04-10 DIAGNOSIS — Z860101 Personal history of adenomatous and serrated colon polyps: Secondary | ICD-10-CM | POA: Diagnosis not present

## 2024-04-10 DIAGNOSIS — E785 Hyperlipidemia, unspecified: Secondary | ICD-10-CM | POA: Diagnosis not present

## 2024-04-10 DIAGNOSIS — I119 Hypertensive heart disease without heart failure: Secondary | ICD-10-CM

## 2024-04-10 DIAGNOSIS — E663 Overweight: Secondary | ICD-10-CM

## 2024-04-10 DIAGNOSIS — E1169 Type 2 diabetes mellitus with other specified complication: Secondary | ICD-10-CM | POA: Diagnosis not present

## 2024-04-10 HISTORY — DX: Right upper quadrant pain: R10.11

## 2024-04-10 LAB — HEMOGLOBIN A1C
Est. average glucose Bld gHb Est-mCnc: 151 mg/dL
Hgb A1c MFr Bld: 6.9 % — ABNORMAL HIGH (ref 4.8–5.6)

## 2024-04-10 LAB — LIPID PANEL
Chol/HDL Ratio: 3.7 ratio (ref 0.0–4.4)
Cholesterol, Total: 198 mg/dL (ref 100–199)
HDL: 54 mg/dL (ref >39)
LDL Chol Calc (NIH): 90 mg/dL (ref 0–99)
Triglycerides: 330 mg/dL — ABNORMAL HIGH (ref 0–149)
VLDL Cholesterol Cal: 54 mg/dL — ABNORMAL HIGH (ref 5–40)

## 2024-04-10 LAB — BMP8+EGFR
BUN/Creatinine Ratio: 16 (ref 12–28)
BUN: 11 mg/dL (ref 8–27)
CO2: 23 mmol/L (ref 20–29)
Calcium: 9.5 mg/dL (ref 8.7–10.3)
Chloride: 99 mmol/L (ref 96–106)
Creatinine, Ser: 0.67 mg/dL (ref 0.57–1.00)
Glucose: 145 mg/dL — ABNORMAL HIGH (ref 70–99)
Potassium: 4.2 mmol/L (ref 3.5–5.2)
Sodium: 139 mmol/L (ref 134–144)
eGFR: 99 mL/min/1.73 (ref >59)

## 2024-04-10 NOTE — Assessment & Plan Note (Signed)
 She reports a history of diverticular disease  - Order CT scan to evaluate abdominal pain. - Avoid seeds and foods that may exacerbate diverticulosis.

## 2024-04-10 NOTE — Progress Notes (Signed)
 Nichole Wilson, CMA,acting as a Neurosurgeon for Gaines Ada, FNP.,have documented all relevant documentation on the behalf of Gaines Ada, FNP,as directed by  Gaines Ada, FNP while in the presence of Gaines Ada, FNP.  Subjective:  Patient ID: Nichole Wilson , female    DOB: 11-08-1960 , 63 y.o.   MRN: [id number]  Chief Complaint  Patient presents with   Hypertension    Patient presents today for a bp and dm follow up, Patient reports compliance with medication. Patient denies any chest pain, SOB, or headaches. Patient has no concerns today.      HPI  Discussed the use of AI scribe software for clinical note transcription with the patient, who gave verbal consent to proceed.  History of Present Illness Nichole Wilson is a 63 year old female with hypertension and diabetes who presents for follow-up on her blood pressure and cholesterol.  Her blood pressure is elevated today, which she attributes to not having taken her medication yet. She has not seen any other doctors/providers since her last visit but has an upcoming appointment with her cardiologist in November.  Her blood sugar levels have been stable, but her A1c was 7 at the last check. She is currently taking metformin  twice a day but often forgets the second dose.  She experiences occasional abdominal pain, which she attributes to a past surgery where a fibroid near her esophagus was removed in 2017, resulting in gastroparesis. She has episodes of delayed gastric emptying and occasional nausea, but vomiting is a past issue. No swelling in her feet or ankles and no urinary issues, though she wakes up at night to use the bathroom.  She had her gallbladder removed in 2013 and still has her appendix. She recalls a past mention of diverticulitis and reports right-sided pain, which she notices especially after consuming seeds. She avoids eating too many seeds to prevent flare-ups of her diverticulitis.   Past Medical History:  Diagnosis  Date   Abnormal bruising 01/11/2023   ANA positive 08/06/2017   Anal fissure - posterior 11/26/2015   Anemia    Anterior scleritis of both eyes 05/12/2015   Followed by Dr Fairy Burnet - Last OV 11/10, 11/18 - Update 11/18 - dx Acute toxic conjunctivitis > improved s/p prednisolone - Primary iridocyclitis (R-eye) - begin taper of prednisolone eye drops, much improved - Requested labs all resulted 11/16: ESR (21), ANA (neg), RPR (non-reactive, no FTA-Ab) >> faxed results back to Dr Burnet    Aortic atherosclerosis 06/02/2021   Breast cancer of upper-outer quadrant of right female breast (HCC) 08/26/2015   Cholelithiasis with choledocholithiasis    Controlled type 2 diabetes mellitus with diabetic nephropathy, without long-term current use of insulin  (HCC) 05/12/2015   Diabetes mellitus (HCC) 05/12/2015   Elevated alkaline phosphatase level 08/07/2018   Normal Alk Phos Isoenzyme   Essential hypertension 01/04/2016   Family history of adverse reaction to anesthesia    father became aggressive   Gastric mass GIST 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    Health care maintenance 03/24/2013   Hepatic steatosis    History of anemia 01/11/2023   History of right breast cancer 04/28/2021   History of right mastectomy 04/28/2021   Hx of adenomatous polyp of colon 12/12/2018   Hypercholesterolemia with hypertriglyceridemia    Hyperlipidemia associated with type 2 diabetes mellitus (HCC) 01/11/2023   Hypertension 01/04/2012   med d/c-last taken 12-07-11  -PT  STATES HER MEDICAL DOCTOR STOPPED HER B/P MEDICATION BECAUSE HER B/P TOO LOW AND PT STATES HER B/P'S HAVE BEEN OK SINCE.   Hypertriglyceridemia    Iron deficiency anemia 10/25/2018   Migraine without aura 01/09/2007   Qualifier: Diagnosis of   By: Rosellen  MD, Adlih      IMO SNOMED Dx Update Oct 2024     Overweight (BMI 25.0-29.9) 05/12/2015   Plantar fasciitis 09/05/2013    Statin intolerance 01/11/2023   Telogen effluvium 06/01/2020     Family History  Problem Relation Age of Onset   Diabetes Mother    Diabetes Father    Diabetes Brother    Heart disease Paternal Uncle    Liver disease Brother    Colon cancer Neg Hx    Esophageal cancer Neg Hx    Rectal cancer Neg Hx    Stomach cancer Neg Hx      Current Outpatient Medications:    ciclopirox  (PENLAC ) 8 % solution, Apply topically at bedtime. Apply over nail and surrounding skin. Apply daily over previous coat. After seven (7) days, remove with alcohol and continue cycle., Disp: 6.6 mL, Rfl: 0   hydrochlorothiazide  (HYDRODIURIL ) 12.5 MG tablet, Take 1 tablet (12.5 mg total) by mouth daily., Disp: 90 tablet, Rfl: 3   icosapent  Ethyl (VASCEPA ) 1 g capsule, Take 2 capsules (2 g total) by mouth 2 (two) times daily., Disp: 360 capsule, Rfl: 1   influenza vac split trivalent PF (FLUZONE ) 0.5 ML injection, Inject 0.5 mLs into the muscle once for 1 dose., Disp: 0.5 mL, Rfl: 0   metFORMIN  (GLUCOPHAGE ) 500 MG tablet, Take 1 tablet (500 mg total) by mouth 2 (two) times daily with a meal., Disp: 180 tablet, Rfl: 1   methylPREDNISolone  (MEDROL  DOSEPAK) 4 MG TBPK tablet, Take as directed on foil pack, Disp: 21 tablet, Rfl: 0   pantoprazole  (PROTONIX ) 40 MG tablet, Take 1 tablet (40 mg total) by mouth daily before breakfast., Disp: 90 tablet, Rfl: 0   Pitavastatin  Calcium  (LIVALO ) 1 MG TABS, Take 1 tablet (1 mg total) by mouth daily., Disp: 90 tablet, Rfl: 3   triamcinolone  ointment (KENALOG ) 0.5 %, Apply 1 Application topically 2 (two) times daily., Disp: 30 g, Rfl: 0   Allergies  Allergen Reactions   Lisinopril  Swelling   Other     General anesthesia-severe vomiting   Zetia  [Ezetimibe ] Diarrhea     Review of Systems  Constitutional:  Negative for fatigue.  Respiratory: Negative.    Cardiovascular:  Negative for chest pain, palpitations and leg swelling.  Gastrointestinal:  Positive for abdominal pain (right  upper abdomen tenderness). Negative for abdominal distention.  Endocrine: Negative for polydipsia, polyphagia and polyuria.  Neurological:  Negative for dizziness, numbness and headaches.  Psychiatric/Behavioral: Negative.       Today's Vitals   04/10/24 0927 04/10/24 0947  BP: (!) 140/80 130/80  Pulse: 99   Temp: 98.4 F (36.9 C)   TempSrc: Oral   Weight: 150 lb (68 kg)   Height: 5' 2 (1.575 m)   PainSc: 0-No pain    Body mass index is 27.44 kg/m.  Wt Readings from Last 3 Encounters:  04/10/24 150 lb (68 kg)  12/05/23 145 lb 6.4 oz (66 kg)  08/08/23 145 lb 3.2 oz (65.9 kg)     Objective:  Physical Exam Vitals and nursing note reviewed.  Constitutional:      General: She is not in acute distress.    Appearance: Normal appearance.  Cardiovascular:  Rate and Rhythm: Normal rate and regular rhythm.     Pulses: Normal pulses.     Heart sounds: Normal heart sounds. No murmur heard. Pulmonary:     Effort: Pulmonary effort is normal. No respiratory distress.     Breath sounds: Normal breath sounds. No wheezing.  Abdominal:     General: Abdomen is flat. Bowel sounds are normal. There is no distension.     Palpations: Abdomen is soft. There is no mass.     Tenderness: There is abdominal tenderness (right upper quadrant). There is no guarding.  Musculoskeletal:     Right wrist: No swelling or deformity. Normal pulse.     Left wrist: No swelling, deformity or tenderness. Normal range of motion. Normal pulse.  Feet:     Right foot:     Toenail Condition: Right toenails are ingrown. Fungal disease present.    Left foot:     Toenail Condition: Left toenails are normal.  Skin:    General: Skin is warm and dry.     Capillary Refill: Capillary refill takes less than 2 seconds.     Findings: No rash.  Neurological:     General: No focal deficit present.     Mental Status: She is alert and oriented to person, place, and time.     Cranial Nerves: No cranial nerve deficit.      Motor: No weakness.  Psychiatric:        Mood and Affect: Mood normal.        Behavior: Behavior normal.        Thought Content: Thought content normal.        Judgment: Judgment normal.      Assessment And Plan:  Hyperlipidemia associated with type 2 diabetes mellitus (HCC) Assessment & Plan: A1c was 7 at last visit. She sometimes forgets the second dose of metformin .  - Check current A1c level. - Consider increasing metformin  dose or adding Jardiance if A1c remains 7 or above. - Encourage adherence to diabetes medication regimen. - Discuss dietary modifications to reduce carbohydrate intake.  Orders: -     Lipid panel -     Hemoglobin A1c  Hypertensive heart disease without heart failure Assessment & Plan: Blood pressure elevated, likely due to missed medication. No acute heart failure symptoms. - Encourage adherence to antihypertensive medication regimen.  Orders: -     BMP8+eGFR  Overweight with body mass index (BMI) of 27 to 27.9 in adult  Aortic atherosclerosis Assessment & Plan: She is statin intolerant  Orders: -     Lipid panel  Hepatic steatosis  Statin intolerance  Hx of adenomatous polyp of colon  Right upper quadrant abdominal pain Assessment & Plan: She reports a history of diverticular disease  - Order CT scan to evaluate abdominal pain. - Avoid seeds and foods that may exacerbate diverticulosis.  Orders: -     CT ABDOMEN PELVIS WO CONTRAST; Future    Return for keep same next.  Patient was given opportunity to ask questions. Patient verbalized understanding of the plan and was able to repeat key elements of the plan. All questions were answered to their satisfaction.    Nichole Gaines Ada, FNP, have reviewed all documentation for this visit. The documentation on 04/10/24 for the exam, diagnosis, procedures, and orders are all accurate and complete.   IF YOU HAVE BEEN REFERRED TO A SPECIALIST, IT MAY TAKE 1-2 WEEKS TO SCHEDULE/PROCESS THE  REFERRAL. IF YOU HAVE NOT HEARD FROM US /SPECIALIST IN TWO WEEKS,  PLEASE GIVE US  A CALL AT [phone].

## 2024-04-10 NOTE — Assessment & Plan Note (Signed)
 Blood pressure elevated, likely due to missed medication. No acute heart failure symptoms. - Encourage adherence to antihypertensive medication regimen.

## 2024-04-10 NOTE — Assessment & Plan Note (Signed)
She is statin intolerant. 

## 2024-04-10 NOTE — Assessment & Plan Note (Signed)
 A1c was 7 at last visit. She sometimes forgets the second dose of metformin .  - Check current A1c level. - Consider increasing metformin  dose or adding Jardiance if A1c remains 7 or above. - Encourage adherence to diabetes medication regimen. - Discuss dietary modifications to reduce carbohydrate intake.

## 2024-04-15 ENCOUNTER — Ambulatory Visit
Admission: RE | Admit: 2024-04-15 | Discharge: 2024-04-15 | Disposition: A | Discharge status: Home / Self Care | Source: Ambulatory Visit | Attending: Nurse Practitioner | Admitting: Nurse Practitioner

## 2024-04-15 DIAGNOSIS — K5730 Diverticulosis of large intestine without perforation or abscess without bleeding: Secondary | ICD-10-CM | POA: Diagnosis not present

## 2024-04-15 DIAGNOSIS — K760 Fatty (change of) liver, not elsewhere classified: Secondary | ICD-10-CM | POA: Diagnosis not present

## 2024-04-15 DIAGNOSIS — R1011 Right upper quadrant pain: Secondary | ICD-10-CM

## 2024-04-17 ENCOUNTER — Other Ambulatory Visit (HOSPITAL_BASED_OUTPATIENT_CLINIC_OR_DEPARTMENT_OTHER): Payer: Self-pay

## 2024-05-19 ENCOUNTER — Encounter: Payer: Self-pay | Admitting: Radiology

## 2024-06-05 ENCOUNTER — Ambulatory Visit: Admitting: Cardiology

## 2024-06-11 ENCOUNTER — Encounter: Payer: Self-pay | Admitting: Family Medicine

## 2024-06-11 ENCOUNTER — Other Ambulatory Visit: Payer: Self-pay | Admitting: Cardiology

## 2024-06-11 ENCOUNTER — Ambulatory Visit: Admitting: Family Medicine

## 2024-06-11 ENCOUNTER — Other Ambulatory Visit (HOSPITAL_BASED_OUTPATIENT_CLINIC_OR_DEPARTMENT_OTHER): Payer: Self-pay

## 2024-06-11 VITALS — BP 128/76 | HR 80 | Temp 99.3°F | Ht 62.0 in | Wt 146.2 lb

## 2024-06-11 DIAGNOSIS — R0989 Other specified symptoms and signs involving the circulatory and respiratory systems: Secondary | ICD-10-CM

## 2024-06-11 DIAGNOSIS — R051 Acute cough: Secondary | ICD-10-CM | POA: Diagnosis not present

## 2024-06-11 DIAGNOSIS — J069 Acute upper respiratory infection, unspecified: Secondary | ICD-10-CM

## 2024-06-11 DIAGNOSIS — E782 Mixed hyperlipidemia: Secondary | ICD-10-CM

## 2024-06-11 DIAGNOSIS — E781 Pure hyperglyceridemia: Secondary | ICD-10-CM

## 2024-06-11 LAB — POCT INFLUENZA A/B
Influenza A, POC: NEGATIVE
Influenza B, POC: NEGATIVE

## 2024-06-11 LAB — POC COVID19 BINAXNOW: SARS Coronavirus 2 Ag: NEGATIVE

## 2024-06-11 NOTE — Progress Notes (Signed)
 Chief Complaint  Patient presents with   Cough    Cough and congestion that began Friday, no testing has been done. Started some amoxil  500mg  BID that she had at home (started 2 days ago). Also taking sukol-D. (Found at [facility])    She started coughing on the evening of 11/21.  She feels like there is phlegm/chest congestion, but unable to get it up.  Didn't have any head congestion initially, this started yesterday. Nose is running this morning, mucus is yellow. She is having pressure in her face, all over--forehead, cheeks and temples.  She took Amoxil  twice daily for 2 days on Monday and Tuesday, without any benefit. (Leftover, doesn't have any more). She started taking a cough syrup  from [employer] last night--didn't like the taste, helped some last night. Tea with honey and cough drops help some.  No known fever or chills. 2 yo granddaughter is currently sick also (at the pediatrician currently).     PMH, PSH, SH reviewed  HTN, migraine, HLD, IFG H/o breast cancer  Outpatient Encounter Medications as of 06/11/2024  Medication Sig Note   amoxicillin  (AMOXIL ) 500 MG capsule Take 500 mg by mouth 2 (two) times daily. 06/11/2024: Started Monday   aspirin 81 MG chewable tablet Chew 1 tablet by mouth daily.    hydrochlorothiazide  (HYDRODIURIL ) 12.5 MG tablet Take 1 tablet (12.5 mg total) by mouth daily.    icosapent  Ethyl (VASCEPA ) 1 g capsule Take 2 capsules (2 g total) by mouth 2 (two) times daily.    loratadine (CLARITIN) 10 MG tablet Take 10 mg by mouth daily.    metFORMIN  (GLUCOPHAGE ) 500 MG tablet Take 1 tablet (500 mg total) by mouth 2 (two) times daily with a meal.    pantoprazole  (PROTONIX ) 40 MG tablet Take 1 tablet (40 mg total) by mouth daily before breakfast.    Pitavastatin  Calcium  (LIVALO ) 1 MG TABS Take 1 tablet (1 mg total) by mouth daily.    triamcinolone  ointment (KENALOG ) 0.5 % Apply 1 Application topically 2 (two) times daily. 06/11/2024: As needed   [DISCONTINUED]  ciclopirox  (PENLAC ) 8 % solution Apply topically at bedtime. Apply over nail and surrounding skin. Apply daily over previous coat. After seven (7) days, remove with alcohol and continue cycle.    [DISCONTINUED] methylPREDNISolone  (MEDROL  DOSEPAK) 4 MG TBPK tablet Take as directed on foil pack    No facility-administered encounter medications on file as of 06/11/2024.     ROS: no f/c, no chest pain or shortness.  No n/v/d No rashes. Denies myalgias, arthralgias. URI symptoms per HPI     PHYSICAL EXAM:  BP 128/76   Pulse 80   Temp 99.3 F (37.4 C) (Tympanic)   Ht 5' 2 (1.575 m)   Wt 146 lb 3.2 oz (66.3 kg)   LMP 11/29/2011   BMI 26.74 kg/m   Pleasant, well-appearing female, with episodic coughing during visit. Sounds wet, unable to expectorate. Speaking comfortably, in no distress HEENT: conjunctiva and sclera are clear, EOMI. TM's and EAC's normal bilaterally. Nasal mucosa mod-severely edematous L>R, with clear mucus on the left. No erythema or purulence noted. Mild diffuse tenderness at temples, frontal and maxillary sinuses. OP clear, without erythema or PND Neck: no lymphadenopathy or mass Heart: regular rate and rhythm Lungs:  breath sounds are intermittently coarse with occasional ronchi, cleared some with coughing Neuro: alert and oriented, cranial nerves grossly intact, normal gait Psych: normal mood, affect, hygiene and grooming   Influenza and COVID tests were negative  ASSESSMENT/PLAN:   Viral upper respiratory infection - supportive measures reviewed, and s/sx bacterial infection  Acute cough - Plan: POC COVID-19 BinaxNow, Influenza A/B  Chest congestion - Plan: POC COVID-19 BinaxNow, Influenza A/B  Drink plenty of water. Get Mucinex DM 12 hour tablets, and take this twice a day. This contains guaifenesin, an expectorant that loosens the mucus, and dextromethorphan, which is a cough suppressant. Do not use other over-the-counter medications, as the  ingredients might overlap. Continue claritin daily.  I recommend doing sinus rinses--get either a [employer] Sinus Rinse kit, or a Neti-pot.  Be sure to use distilled water, or boiled water (not tap water). You can do this up to twice daily, and will help with your sinus pressure.  If your mucus or phlegm stay consistently yellow or green, or if you have persistent fevers, or worsening sinus pain with discolored mucus, this could be a sign that you have developed a bacterial infection, and need an antibiotic. Contact our office if this occurs.  If you have very high fever, worsening cough, pain with breathing, or shortness of breath, we would like to see you back in the office.

## 2024-06-11 NOTE — Patient Instructions (Signed)
 Drink plenty of water. Get Mucinex DM 12 hour tablets, and take this twice a day. This contains guaifenesin, an expectorant that loosens the mucus, and dextromethorphan, which is a cough suppressant. Do not use other over-the-counter medications, as the ingredients might overlap. Continue claritin daily.  I recommend doing sinus rinses--get either a [employer] Sinus Rinse kit, or a Neti-pot.  Be sure to use distilled water, or boiled water (not tap water). You can do this up to twice daily, and will help with your sinus pressure.  If your mucus or phlegm stay consistently yellow or green, or if you have persistent fevers, or worsening sinus pain with discolored mucus, this could be a sign that you have developed a bacterial infection, and need an antibiotic. Contact our office if this occurs.  If you have very high fever, worsening cough, pain with breathing, or shortness of breath, we would like to see you back in the office.

## 2024-06-16 ENCOUNTER — Encounter: Payer: Self-pay | Admitting: Hematology and Oncology

## 2024-06-16 ENCOUNTER — Other Ambulatory Visit (HOSPITAL_BASED_OUTPATIENT_CLINIC_OR_DEPARTMENT_OTHER): Payer: Self-pay

## 2024-06-16 MED ORDER — ICOSAPENT ETHYL 1 G PO CAPS
2.0000 g | ORAL_CAPSULE | Freq: Two times a day (BID) | ORAL | 1 refills | Status: AC
  Filled 2024-06-16: qty 360, 90d supply, fill #0

## 2024-06-24 ENCOUNTER — Other Ambulatory Visit: Payer: Self-pay

## 2024-06-25 ENCOUNTER — Ambulatory Visit: Attending: Cardiology | Admitting: Cardiology

## 2024-07-08 ENCOUNTER — Other Ambulatory Visit (HOSPITAL_BASED_OUTPATIENT_CLINIC_OR_DEPARTMENT_OTHER): Payer: Self-pay

## 2024-07-08 ENCOUNTER — Other Ambulatory Visit: Payer: Self-pay | Admitting: Cardiology

## 2024-07-08 DIAGNOSIS — E782 Mixed hyperlipidemia: Secondary | ICD-10-CM

## 2024-07-08 DIAGNOSIS — I700 Atherosclerosis of aorta: Secondary | ICD-10-CM

## 2024-07-08 MED ORDER — PITAVASTATIN CALCIUM 1 MG PO TABS
1.0000 mg | ORAL_TABLET | Freq: Every day | ORAL | 0 refills | Status: AC
  Filled 2024-07-08: qty 30, 30d supply, fill #0

## 2024-08-11 ENCOUNTER — Encounter: Payer: 59 | Admitting: Nurse Practitioner

## 2024-08-11 ENCOUNTER — Other Ambulatory Visit: Payer: Self-pay | Admitting: Cardiology

## 2024-08-11 DIAGNOSIS — I700 Atherosclerosis of aorta: Secondary | ICD-10-CM

## 2024-08-11 DIAGNOSIS — I10 Essential (primary) hypertension: Secondary | ICD-10-CM

## 2024-08-11 DIAGNOSIS — E782 Mixed hyperlipidemia: Secondary | ICD-10-CM

## 2024-08-26 ENCOUNTER — Encounter: Payer: Self-pay | Admitting: Nurse Practitioner

## 2024-09-23 ENCOUNTER — Encounter: Admitting: Nurse Practitioner
